# Patient Record
Sex: Female | Born: 1970 | Race: White | Hispanic: No | Smoking: Never smoker
Health system: Southern US, Community
[De-identification: ages and names within clinical notes are randomized; demographics above are authoritative.]

## PROBLEM LIST (undated history)

## (undated) ENCOUNTER — Emergency Department (HOSPITAL_BASED_OUTPATIENT_CLINIC_OR_DEPARTMENT_OTHER): Admission: EM | Payer: BLUE CROSS/BLUE SHIELD | Source: Home / Self Care

## (undated) DIAGNOSIS — R569 Unspecified convulsions: Secondary | ICD-10-CM

## (undated) DIAGNOSIS — F1010 Alcohol abuse, uncomplicated: Secondary | ICD-10-CM

## (undated) DIAGNOSIS — I10 Essential (primary) hypertension: Secondary | ICD-10-CM

## (undated) DIAGNOSIS — E7800 Pure hypercholesterolemia, unspecified: Secondary | ICD-10-CM

---

## 2008-03-15 ENCOUNTER — Emergency Department (HOSPITAL_BASED_OUTPATIENT_CLINIC_OR_DEPARTMENT_OTHER): Admission: EM | Admit: 2008-03-15 | Discharge: 2008-03-15 | Payer: Self-pay | Admitting: Emergency Medicine

## 2012-08-29 ENCOUNTER — Emergency Department (HOSPITAL_BASED_OUTPATIENT_CLINIC_OR_DEPARTMENT_OTHER)
Admission: EM | Admit: 2012-08-29 | Discharge: 2012-08-29 | Disposition: A | Discharge status: Home / Self Care | Payer: BC Managed Care – PPO | Attending: Emergency Medicine | Admitting: Emergency Medicine

## 2012-08-29 ENCOUNTER — Emergency Department (HOSPITAL_BASED_OUTPATIENT_CLINIC_OR_DEPARTMENT_OTHER): Payer: BC Managed Care – PPO

## 2012-08-29 ENCOUNTER — Encounter (HOSPITAL_BASED_OUTPATIENT_CLINIC_OR_DEPARTMENT_OTHER): Payer: Self-pay | Admitting: Student

## 2012-08-29 DIAGNOSIS — IMO0001 Reserved for inherently not codable concepts without codable children: Secondary | ICD-10-CM | POA: Insufficient documentation

## 2012-08-29 DIAGNOSIS — R05 Cough: Secondary | ICD-10-CM | POA: Insufficient documentation

## 2012-08-29 DIAGNOSIS — L519 Erythema multiforme, unspecified: Secondary | ICD-10-CM | POA: Insufficient documentation

## 2012-08-29 DIAGNOSIS — J189 Pneumonia, unspecified organism: Secondary | ICD-10-CM | POA: Insufficient documentation

## 2012-08-29 DIAGNOSIS — R51 Headache: Secondary | ICD-10-CM | POA: Insufficient documentation

## 2012-08-29 DIAGNOSIS — R509 Fever, unspecified: Secondary | ICD-10-CM | POA: Insufficient documentation

## 2012-08-29 DIAGNOSIS — R059 Cough, unspecified: Secondary | ICD-10-CM | POA: Insufficient documentation

## 2012-08-29 LAB — RAPID STREP SCREEN (MED CTR MEBANE ONLY): Streptococcus, Group A Screen (Direct): NEGATIVE

## 2012-08-29 MED ORDER — AZITHROMYCIN 250 MG PO TABS
500.0000 mg | ORAL_TABLET | Freq: Once | ORAL | Status: AC
  Administered 2012-08-29: 500 mg via ORAL
  Filled 2012-08-29: qty 2

## 2012-08-29 MED ORDER — AZITHROMYCIN 250 MG PO TABS: 250.0000 mg | ORAL_TABLET | Freq: Every day | ORAL | Status: DC

## 2012-08-29 NOTE — ED Notes (Signed)
Patient transported to X-ray 

## 2012-08-29 NOTE — ED Provider Notes (Signed)
History     CSN: [id number]  Arrival date & time 08/29/12  1645   First MD Initiated Contact with Patient 08/29/12 1746      Chief Complaint  Patient presents with  . Sore Throat  . Cough  . Generalized Body Aches  . Fever    (Consider location/radiation/quality/duration/timing/severity/associated sxs/prior treatment) Patient is a 41 y.o. female presenting with pharyngitis, cough, and fever. The history is provided by the patient. No language interpreter was used.  Sore Throat This is a new problem. The current episode started yesterday. The problem occurs constantly. The problem has been unchanged. Associated symptoms include coughing, a fever, headaches, myalgias and a sore throat. Nothing aggravates the symptoms. She has tried NSAIDs for the symptoms. The treatment provided mild relief.  Cough Associated symptoms include headaches, sore throat and myalgias.  Fever Primary symptoms of the febrile illness include fever, headaches, cough and myalgias.    History reviewed. No pertinent past medical history.  History reviewed. No pertinent past surgical history.  No family history on file.  History  Substance Use Topics  . Smoking status: Never Smoker   . Smokeless tobacco: Not on file  . Alcohol Use: Yes    OB History    Grav Para Term Preterm Abortions TAB SAB Ect Mult Living                  Review of Systems  Constitutional: Positive for fever.  HENT: Positive for sore throat.   Respiratory: Positive for cough.   Cardiovascular: Negative.   Musculoskeletal: Positive for myalgias.  Neurological: Positive for headaches.    Allergies  Demerol  Home Medications  No current outpatient prescriptions on file.  BP 138/78  Pulse 105  Temp 99 F (37.2 C) (Oral)  Resp 20  Ht 5\' 2"  (1.575 m)  Wt 170 lb (77.111 kg)  BMI 31.09 kg/m2  SpO2 97%  LMP 08/15/2012  Physical Exam  Nursing note and vitals reviewed. Constitutional: She is oriented to person,  place, and time. She appears well-developed and well-nourished.  HENT:  Head: Normocephalic and atraumatic.  Right Ear: External ear normal.  Left Ear: External ear normal.  Mouth/Throat: Posterior oropharyngeal erythema present.  Eyes: Pupils are equal, round, and reactive to light.  Neck: Normal range of motion. Neck supple.  Cardiovascular: Normal rate and regular rhythm.   Pulmonary/Chest: Effort normal and breath sounds normal.  Musculoskeletal: Normal range of motion.  Neurological: She is alert and oriented to person, place, and time.  Skin: Skin is warm and dry.  Psychiatric: She has a normal mood and affect.    ED Course  Procedures (including critical care time)   Labs Reviewed  RAPID STREP SCREEN   Dg Chest 2 View  08/29/2012  *RADIOLOGY REPORT*  Clinical Data: Cough, fever  CHEST - 2 VIEW  Comparison: None.  Findings: Mild elevation of the left diaphragmatic leaflet, with some patchy subsegmental atelectasis versus early interstitial infiltrate in the posterior left lower lobe.  Right lung clear.  No effusion.  Heart size normal.  Regional bones unremarkable.  IMPRESSION:  1.  Patchy posterior left lower lobe atelectasis or early infiltrate.   Original Report Authenticated By: D. Andria Rhein, MD      1. Community acquired pneumonia       MDM  Will treat for pneumonia:pt vitals are stable:pt given first dose of antibiotics here        Teressa Lower, NP 08/29/12 [phone]

## 2012-08-29 NOTE — ED Notes (Signed)
Vrinda Pickering, FNP at bedside 

## 2012-08-29 NOTE — ED Notes (Signed)
Pt reports onset of temp (104.8), cough, generalized body aches, wheezing, chills and sore throat that started last night. Fever relieved after taking ibuprofen.

## 2012-08-29 NOTE — ED Provider Notes (Signed)
Medical screening examination/treatment/procedure(s) were performed by non-physician practitioner and as supervising physician I was immediately available for consultation/collaboration.  Marwan T Powers, MD 08/29/12 2217 

## 2012-08-29 NOTE — ED Notes (Signed)
Fever 104.8 600 mg IBU at home 1 hr ago.

## 2012-08-30 NOTE — ED Provider Notes (Signed)
Medical screening examination/treatment/procedure(s) were performed by non-physician practitioner and as supervising physician I was immediately available for consultation/collaboration.  Tobin Chad, MD 08/30/12 [phone]

## 2017-03-22 ENCOUNTER — Emergency Department (HOSPITAL_BASED_OUTPATIENT_CLINIC_OR_DEPARTMENT_OTHER): Payer: BLUE CROSS/BLUE SHIELD

## 2017-03-22 ENCOUNTER — Encounter (HOSPITAL_BASED_OUTPATIENT_CLINIC_OR_DEPARTMENT_OTHER): Payer: Self-pay

## 2017-03-22 ENCOUNTER — Emergency Department (HOSPITAL_BASED_OUTPATIENT_CLINIC_OR_DEPARTMENT_OTHER)
Admission: EM | Admit: 2017-03-22 | Discharge: 2017-03-22 | Disposition: A | Discharge status: Home / Self Care | Payer: BLUE CROSS/BLUE SHIELD | Attending: Emergency Medicine | Admitting: Emergency Medicine

## 2017-03-22 DIAGNOSIS — S92511A Displaced fracture of proximal phalanx of right lesser toe(s), initial encounter for closed fracture: Secondary | ICD-10-CM

## 2017-03-22 DIAGNOSIS — S92512A Displaced fracture of proximal phalanx of left lesser toe(s), initial encounter for closed fracture: Secondary | ICD-10-CM | POA: Diagnosis not present

## 2017-03-22 DIAGNOSIS — W228XXA Striking against or struck by other objects, initial encounter: Secondary | ICD-10-CM | POA: Diagnosis not present

## 2017-03-22 DIAGNOSIS — Y999 Unspecified external cause status: Secondary | ICD-10-CM | POA: Diagnosis not present

## 2017-03-22 DIAGNOSIS — Z79899 Other long term (current) drug therapy: Secondary | ICD-10-CM | POA: Diagnosis not present

## 2017-03-22 DIAGNOSIS — Y939 Activity, unspecified: Secondary | ICD-10-CM | POA: Diagnosis not present

## 2017-03-22 DIAGNOSIS — Y929 Unspecified place or not applicable: Secondary | ICD-10-CM | POA: Diagnosis not present

## 2017-03-22 DIAGNOSIS — S99922A Unspecified injury of left foot, initial encounter: Secondary | ICD-10-CM | POA: Diagnosis present

## 2017-03-22 DIAGNOSIS — I10 Essential (primary) hypertension: Secondary | ICD-10-CM | POA: Insufficient documentation

## 2017-03-22 HISTORY — DX: Pure hypercholesterolemia, unspecified: E78.00

## 2017-03-22 HISTORY — DX: Essential (primary) hypertension: I10

## 2017-03-22 MED ORDER — TRAMADOL HCL 50 MG PO TABS: 50.0000 mg | ORAL_TABLET | Freq: Four times a day (QID) | ORAL | 0 refills | Status: DC | PRN

## 2017-03-22 MED ORDER — ACETAMINOPHEN 500 MG PO TABS
1000.0000 mg | ORAL_TABLET | Freq: Once | ORAL | Status: AC
  Administered 2017-03-22: 1000 mg via ORAL
  Filled 2017-03-22: qty 2

## 2017-03-22 NOTE — ED Provider Notes (Signed)
[facility] Note   CSN: [id number] Arrival date & time: 03/22/17  1713   By signing my name below, I, Clarisse GougeXavier Herndon, attest that this documentation has been prepared under the direction and in the presence of SPX CorporationMichael Maczis, PA-C. Electronically Signed: Clarisse GougeXavier Herndon, Scribe. 03/22/17. 5:40 PM.   History   Chief Complaint Chief Complaint  Patient presents with  . Foot Injury   The history is provided by the patient and medical records. No language interpreter was used.    Meredith Park is a 46 y.o. female with h/o HTN presenting to the Emergency Department concerning L 5th toe pain s/p dropping vase on foot around noon today. She states the vase was large, glass and weighed between 5-15 pounds; she states it did not break when it fell. She now has 6/10, constant pain that is worse with palpation, walking and application of pressure. There is small superficial laceration over the area in addition. No PTA medications. She states she is able to bear weight on the foot and ambulate but states this is painful. Pt noted elevated blood pressure. She states she drank 3-4 alcoholic beverages today. Last tetanus unknown. No other complaints at this time.   Past Medical History:  Diagnosis Date  . High cholesterol   . Hypertension     There are no active problems to display for this patient.   History reviewed. No pertinent surgical history.  OB History    No data available       Home Medications    Prior to Admission medications   Medication Sig Start Date End Date Taking? Authorizing Provider  ATORVASTATIN CALCIUM PO Take by mouth.   Yes [provider]  CARVEDILOL PO Take by mouth.   Yes [provider]  LOSARTAN POTASSIUM PO Take by mouth.   Yes [provider]  traMADol (ULTRAM) 50 MG tablet Take 1 tablet (50 mg total) by mouth every 6 (six) hours as needed. 03/22/17   Maczis, Elmer SowMichael M, PA-C    Family History No family  history on file.  Social History Social History  Substance Use Topics  . Smoking status: Never Smoker  . Smokeless tobacco: Never Used  . Alcohol use Yes     Comment: weekly     Allergies   Demerol [meperidine]   Review of Systems Review of Systems  Musculoskeletal: Positive for arthralgias, gait problem and myalgias. Negative for joint swelling.  Skin: Positive for wound.  All other systems reviewed and are negative.    Physical Exam Updated Vital Signs BP (!) 184/120 (BP Location: Left Arm)   Pulse (!) 103   Temp 98.1 F (36.7 C) (Oral)   Resp 20   Wt 172 lb 3.2 oz (78.1 kg)   LMP 02/19/2017   SpO2 100%   BMI 31.50 kg/m   Physical Exam  Constitutional: She appears well-developed and well-nourished.  HENT:  Head: Normocephalic and atraumatic.  Right Ear: External ear normal.  Left Ear: External ear normal.  Eyes: Conjunctivae are normal. Right eye exhibits no discharge. Left eye exhibits no discharge. No scleral icterus.  Cardiovascular:  Pulses:      Dorsalis pedis pulses are 2+ on the right side.       Posterior tibial pulses are 2+ on the right side.  Pulmonary/Chest: Effort normal. No respiratory distress.  Musculoskeletal:       Right ankle: Normal.       Right foot: There is tenderness, bony tenderness, swelling and  laceration. There is normal range of motion.       Feet:  NVI distally.   Neurological: She is alert. She has normal strength. No sensory deficit.  Skin: No pallor.  Psychiatric: She has a normal mood and affect.  Nursing note and vitals reviewed.    ED Treatments / Results  DIAGNOSTIC STUDIES: Oxygen Saturation is 100% on RA, NL by my interpretation.    COORDINATION OF CARE: 5:34 PM-Discussed next steps with pt. Pt verbalized understanding and is agreeable with the plan. Will order imaging.   Labs (all labs ordered are listed, but only abnormal results are displayed) Labs Reviewed - No data to display  EKG  EKG  Interpretation None       Radiology Dg Foot Complete Right  Result Date: 03/22/2017 CLINICAL DATA:  Rule [age]-year-old who dropped a base onto the right fifth toe earlier today with bleeding and bruising. Initial encounter. EXAM: RIGHT FOOT COMPLETE - 3+ VIEW COMPARISON:  None. FINDINGS: Comminuted mildly displaced likely intra-articular fracture involving the head of the proximal phalanx of the fifth toe. No fractures elsewhere. Well preserved joint spaces. Well-preserved bone mineral density. IMPRESSION: Comminuted mildly displaced fracture involving the head of the proximal phalanx of the fifth toe. Electronically Signed   By: Hulan Saas M.D.   On: 03/22/2017 18:23    Procedures Procedures (including critical care time)  Medications Ordered in ED Medications  acetaminophen (TYLENOL) tablet 1,000 mg (1,000 mg Oral Given 03/22/17 1749)     Initial Impression / Assessment and Plan / ED Course  I have reviewed the triage vital signs and the nursing notes.  Pertinent labs & imaging results that were available during my care of the patient were reviewed by me and considered in my medical decision making (see chart for details).     46 year old female presenting after dropping vase on right foot earlier today. X-rays show a comminuted, fracture involving the head of the proximal phalanx of the fifth toe. The patient does have a wound that was cleansed and explored to be superficial over the skin, not requiring closure. This is a closed fracture. Buddy tape applied to the toes, and patient placed in postop shoe. Pain management in the emergency department. The patient is neurovascularly intact distally. I advised the patient to follow-up with their orthopedics this week. I advised the patient to return to the emergency department with new or worsening symptoms or new concerns. Specific return precautions discussed. The patient verbalized understanding and agreement with plan. All  questions answered. No further questions at this time. Patient appears safe for discharge.  Patient was noted to have an elevated blood pressure during their stay in the emergency department. The patient has a history of high blood pressure. She state they have taken their medication for this today. The patient is taking carvedilol, atorvastatin, losartan as advised at home for HTN. Suspect the patients blood pressure is moderately elevated due to pain. The patient blood pressure downtrended after the patient was given tylenol for pain. Patient also has drank several drinks tonight. I advised the patient to discuss this with their PCP during her follow up visit to decide if medication management is needed for this.   Patient case discussed with Dr. Preston Fleeting who is in agreement with plan.    Final Clinical Impressions(s) / ED Diagnoses   Final diagnoses:  Closed displaced fracture of proximal phalanx of lesser toe of right foot, initial encounter    New Prescriptions Discharge Medication List  as of 03/22/2017  7:28 PM    START taking these medications   Details  traMADol (ULTRAM) 50 MG tablet Take 1 tablet (50 mg total) by mouth every 6 (six) hours as needed., Starting Thu 03/22/2017, Print       I personally performed the services described in this documentation, which was scribed in my presence. The recorded information has been reviewed and is accurate.       Princella Pellegrini 03/23/17 0038    Dione Booze, MD 03/23/17 [phone]

## 2017-03-22 NOTE — ED Notes (Signed)
Patient transported to X-ray 

## 2017-03-22 NOTE — ED Triage Notes (Signed)
Pt states she dropped a vase on right foot at pinky toe today-NAD-limping gait

## 2017-03-22 NOTE — ED Notes (Signed)
PMS intact before and after. Pt tolerated well. All questions answered. 

## 2017-03-22 NOTE — Discharge Instructions (Signed)
You have fractured you toe. Please wear buddy tape and post op shoe and follow-up with orthopedics for further evaluation and treatment.I have provided there number above.   For pain control you may take:  800mg  of ibuprofen (that is usually 4 over the counter pills)  3 times a day (take with food) and acetaminophen 975mg  (this is 3 over the counter pills) four times a day. Do not drink alcohol or combine with other medications that have acetaminophen as an ingredient (Read the labels!).  For breakthrough pain you may take tramadol. Do not drink alcohol drive or operate heavy machinery when taking tramadol.  If your toe was taped to a toe that is next to it (buddy taping). Change daily. Change it more often: If the gauze and tape get wet. If this happens, dry the space between the toes. If the gauze and tape are too tight and they cause your toe to become pale or to lose feeling (numb). Wear a protective shoe as told by your doctor.  Do not use any tobacco products, including cigarettes, chewing tobacco, or e-cigarettes. Tobacco can delay bone healing. If you need help quitting, ask your doctor. Contact a doctor if: You have a fever. Your pain medicine is not helping. Your toe feels cold. You lose feeling (have numbness) in your toe. You still have pain after one week of rest and treatment. You still have pain after your doctor has said that you can start walking again. You have pain or tingling in your foot, and it is not going away. You have loss of feeling in your foot, and it is not going away.  Your blood pressure was elevated during today's visit. Please take at home medication when arrive home. Please discuss this with your PCP during your follow-up appointment to determine if a medication adjustment/addition is needed for this

## 2017-08-24 ENCOUNTER — Encounter (HOSPITAL_BASED_OUTPATIENT_CLINIC_OR_DEPARTMENT_OTHER): Payer: Self-pay | Admitting: Emergency Medicine

## 2017-08-24 ENCOUNTER — Emergency Department (HOSPITAL_BASED_OUTPATIENT_CLINIC_OR_DEPARTMENT_OTHER): Payer: BLUE CROSS/BLUE SHIELD

## 2017-08-24 ENCOUNTER — Other Ambulatory Visit: Payer: Self-pay

## 2017-08-24 ENCOUNTER — Emergency Department (HOSPITAL_BASED_OUTPATIENT_CLINIC_OR_DEPARTMENT_OTHER)
Admission: EM | Admit: 2017-08-24 | Discharge: 2017-08-24 | Disposition: A | Discharge status: Home / Self Care | Payer: BLUE CROSS/BLUE SHIELD | Attending: Emergency Medicine | Admitting: Emergency Medicine

## 2017-08-24 DIAGNOSIS — F10931 Alcohol use, unspecified with withdrawal delirium: Secondary | ICD-10-CM

## 2017-08-24 DIAGNOSIS — Y909 Presence of alcohol in blood, level not specified: Secondary | ICD-10-CM | POA: Diagnosis not present

## 2017-08-24 DIAGNOSIS — I160 Hypertensive urgency: Secondary | ICD-10-CM

## 2017-08-24 DIAGNOSIS — F10231 Alcohol dependence with withdrawal delirium: Secondary | ICD-10-CM | POA: Insufficient documentation

## 2017-08-24 DIAGNOSIS — R4182 Altered mental status, unspecified: Secondary | ICD-10-CM | POA: Diagnosis present

## 2017-08-24 LAB — URINALYSIS, MICROSCOPIC (REFLEX): WBC, UA: NONE SEEN WBC/hpf (ref 0–5)

## 2017-08-24 LAB — URINALYSIS, ROUTINE W REFLEX MICROSCOPIC
GLUCOSE, UA: NEGATIVE mg/dL
Ketones, ur: >80 mg/dL — AB
LEUKOCYTES UA: NEGATIVE
NITRITE: NEGATIVE
PROTEIN: 100 mg/dL — AB
Specific Gravity, Urine: >1.03 — ABNORMAL HIGH (ref 1.005–1.030)
pH: 6 (ref 5.0–8.0)

## 2017-08-24 LAB — PREGNANCY, URINE: Preg Test, Ur: NEGATIVE

## 2017-08-24 LAB — CBC
HCT: 36.6 % (ref 36.0–46.0)
Hemoglobin: 12.5 g/dL (ref 12.0–15.0)
MCH: 28.9 pg (ref 26.0–34.0)
MCHC: 34.2 g/dL (ref 30.0–36.0)
MCV: 84.5 fL (ref 78.0–100.0)
PLATELETS: 202 10*3/uL (ref 150–400)
RBC: 4.33 MIL/uL (ref 3.87–5.11)
RDW: 14.9 % (ref 11.5–15.5)
WBC: 8.9 10*3/uL (ref 4.0–10.5)

## 2017-08-24 LAB — COMPREHENSIVE METABOLIC PANEL
ALT: 25 U/L (ref 14–54)
AST: 39 U/L (ref 15–41)
Albumin: 4.2 g/dL (ref 3.5–5.0)
Alkaline Phosphatase: 86 U/L (ref 38–126)
Anion gap: 15 (ref 5–15)
BUN: 8 mg/dL (ref 6–20)
CO2: 20 mmol/L — AB (ref 22–32)
CREATININE: 0.7 mg/dL (ref 0.44–1.00)
Calcium: 8.1 mg/dL — ABNORMAL LOW (ref 8.9–10.3)
Chloride: 96 mmol/L — ABNORMAL LOW (ref 101–111)
GFR calc non Af Amer: >60 mL/min (ref >60)
Glucose, Bld: 138 mg/dL — ABNORMAL HIGH (ref 65–99)
Potassium: 3.6 mmol/L (ref 3.5–5.1)
SODIUM: 131 mmol/L — AB (ref 135–145)
Total Bilirubin: 1.6 mg/dL — ABNORMAL HIGH (ref 0.3–1.2)
Total Protein: 7.4 g/dL (ref 6.5–8.1)

## 2017-08-24 LAB — AMMONIA: AMMONIA: 26 umol/L (ref 9–35)

## 2017-08-24 LAB — ETHANOL: Alcohol, Ethyl (B): <10 mg/dL (ref <10)

## 2017-08-24 LAB — CBG MONITORING, ED: Glucose-Capillary: 136 mg/dL — ABNORMAL HIGH (ref 65–99)

## 2017-08-24 MED ORDER — LOSARTAN POTASSIUM-HCTZ 100-25 MG PO TABS: 1.0000 | ORAL_TABLET | Freq: Every day | ORAL | 0 refills | Status: DC

## 2017-08-24 MED ORDER — CARVEDILOL 12.5 MG PO TABS: 12.5000 mg | ORAL_TABLET | Freq: Two times a day (BID) | ORAL | 0 refills | Status: DC

## 2017-08-24 MED ORDER — SODIUM CHLORIDE 0.9 % IV BOLUS (SEPSIS)
1000.0000 mL | Freq: Once | INTRAVENOUS | Status: AC
  Administered 2017-08-24: 1000 mL via INTRAVENOUS

## 2017-08-24 MED ORDER — VITAMIN B-1 100 MG PO TABS
100.0000 mg | ORAL_TABLET | Freq: Once | ORAL | Status: AC
  Administered 2017-08-24: 100 mg via ORAL
  Filled 2017-08-24: qty 1

## 2017-08-24 MED ORDER — THIAMINE HCL 100 MG/ML IJ SOLN
Freq: Once | INTRAVENOUS | Status: DC
  Filled 2017-08-24: qty 1000

## 2017-08-24 MED ORDER — CHLORDIAZEPOXIDE HCL 25 MG PO CAPS: ORAL_CAPSULE | ORAL | 0 refills | Status: DC

## 2017-08-24 MED ORDER — FOLIC ACID 1 MG PO TABS
1.0000 mg | ORAL_TABLET | Freq: Once | ORAL | Status: AC
  Administered 2017-08-24: 1 mg via ORAL
  Filled 2017-08-24: qty 1

## 2017-08-24 MED ORDER — ONDANSETRON HCL 4 MG/2ML IJ SOLN
4.0000 mg | Freq: Once | INTRAMUSCULAR | Status: AC
  Administered 2017-08-24: 4 mg via INTRAVENOUS
  Filled 2017-08-24: qty 2

## 2017-08-24 MED ORDER — ONDANSETRON HCL 4 MG PO TABS: 4.0000 mg | ORAL_TABLET | Freq: Three times a day (TID) | ORAL | 0 refills | Status: DC | PRN

## 2017-08-24 MED ORDER — HYDRALAZINE HCL 20 MG/ML IJ SOLN
10.0000 mg | Freq: Once | INTRAMUSCULAR | Status: AC
  Administered 2017-08-24: 10 mg via INTRAVENOUS
  Filled 2017-08-24: qty 1

## 2017-08-24 NOTE — ED Notes (Signed)
During triage the daughter states that she has been with her mom all day - Patient laid down for a nap about 2 hours ago and then slept for 1 hour. THe patient woke up with new confusion. Daughter and Patient both states that the patient was not confused prior to laying down 2 hours ago

## 2017-08-24 NOTE — ED Notes (Signed)
Pt and family understood dc material. NAD noted. SCripts given at dc 

## 2017-08-24 NOTE — ED Triage Notes (Signed)
Patient states that she has been throwing up for the last 24 hours - Denies any pain - patient states that she has drank ETOH  Recently unsure of when the last was - "way too much" - last drink was  and has High Blood Pressure noted in triage

## 2017-08-24 NOTE — ED Provider Notes (Signed)
[facility] EMERGENCY DEPARTMENT Provider Note   CSN: [id number] Arrival date & time: 08/24/17  1729     History   Chief Complaint Chief Complaint  Patient presents with  . Altered Mental Status    HPI Meredith Park is a 46 y.o. female.  HPI   46 year old female with history of hypertension, hypercholesteremia, alcohol abuse brought in by daughter for evaluation of altered mental status.  History obtained through daughter who is at bedside.  Patient has been drinking heavy amount of alcohol for the past 8 years, usually half a fifth of liquor daily.  She admits that she is an alcoholic.  Drank heavily last night and was feeling sick throughout the day today.  States that she is nauseous and vomited multiple times.  She went to sleep and when she woke up she felt confused.  Daughter noticed that her face was puffy and check blood pressure.  States that it was high in the 200s and patient brought here for further evaluation.  Currently patient endorsed nausea and a mild headache.  She denies vision changes, slurring of speech, feeling tremulous, having chest pain, trouble breathing, abdominal pain or back pain.  Denies any focal numbness or weakness.  Past Medical History:  Diagnosis Date  . High cholesterol   . Hypertension     There are no active problems to display for this patient.   History reviewed. No pertinent surgical history.  OB History    No data available       Home Medications    Prior to Admission medications   Medication Sig Start Date End Date Taking? Authorizing Provider  ATORVASTATIN CALCIUM PO Take by mouth.    [provider]  CARVEDILOL PO Take 12.5 mg by mouth 2 (two) times daily.     [provider]  chlordiazePOXIDE (LIBRIUM) 25 MG capsule 50mg  PO TID x 1D, then 25-50mg  PO BID X 1D, then 25-50mg  PO QD X 1D 08/24/17   Fayrene Helper, PA-C  LOSARTAN POTASSIUM PO Take by mouth.    [provider]    losartan-hydrochlorothiazide (HYZAAR) 100-25 MG tablet Take 1 tablet by mouth daily.    [provider]  ondansetron (ZOFRAN) 4 MG tablet Take 1 tablet (4 mg total) by mouth every 8 (eight) hours as needed for nausea or vomiting. 08/24/17   Fayrene Helper, PA-C  traMADol (ULTRAM) 50 MG tablet Take 1 tablet (50 mg total) by mouth every 6 (six) hours as needed. 03/22/17   Maczis, Elmer Sow, PA-C    Family History History reviewed. No pertinent family history.  Social History Social History   Tobacco Use  . Smoking status: Never Smoker  . Smokeless tobacco: Never Used  Substance Use Topics  . Alcohol use: Yes    Comment: weekly  . Drug use: No     Allergies   Demerol [meperidine]   Review of Systems Review of Systems  All other systems reviewed and are negative.    Physical Exam Updated Vital Signs BP (!) 202/138 (BP Location: Left Arm)   Pulse (!) 116   Temp 98.6 F (37 C) (Oral)   Resp 16   Ht 5\' 2"  (1.575 m)   Wt 90.7 kg (200 lb)   SpO2 93%   BMI 36.58 kg/m   Physical Exam  Constitutional: She is oriented to person, place, and time. She appears well-developed and well-nourished. No distress.  HENT:  Head: Atraumatic.  Mouth/Throat: Oropharynx is clear and moist.  Eyes:  Conjunctivae and EOM are normal. Pupils are equal, round, and reactive to light.  Neck: Normal range of motion. Neck supple.  No nuchal rigidity  Cardiovascular:  Tachycardia without murmur rubs or gallops  Pulmonary/Chest: Effort normal and breath sounds normal.  Abdominal: Soft. Bowel sounds are normal. She exhibits no distension. There is no tenderness.  Neurological: She is alert and oriented to person, place, and time. She has normal strength. She displays no tremor. No cranial nerve deficit or sensory deficit. She displays a negative Romberg sign. Coordination normal. GCS eye subscore is 4. GCS verbal subscore is 5. GCS motor subscore is 6.  Skin: No rash noted.  Psychiatric: She  has a normal mood and affect.  Nursing note and vitals reviewed.    ED Treatments / Results  Labs (all labs ordered are listed, but only abnormal results are displayed) Labs Reviewed  COMPREHENSIVE METABOLIC PANEL - Abnormal; Notable for the following components:      Result Value   Sodium 131 (*)    Chloride 96 (*)    CO2 20 (*)    Glucose, Bld 138 (*)    Calcium 8.1 (*)    Total Bilirubin 1.6 (*)    All other components within normal limits  URINALYSIS, ROUTINE W REFLEX MICROSCOPIC - Abnormal; Notable for the following components:   Specific Gravity, Urine >1.030 (*)    Hgb urine dipstick SMALL (*)    Bilirubin Urine SMALL (*)    Ketones, ur >80 (*)    Protein, ur 100 (*)    All other components within normal limits  URINALYSIS, MICROSCOPIC (REFLEX) - Abnormal; Notable for the following components:   Bacteria, UA RARE (*)    Squamous Epithelial / LPF 0-5 (*)    All other components within normal limits  CBG MONITORING, ED - Abnormal; Notable for the following components:   Glucose-Capillary 136 (*)    All other components within normal limits  CBC  PREGNANCY, URINE  ETHANOL  AMMONIA    EKG  EKG Interpretation  Date/Time:  Friday August 24 2017 17:57:45 EST Ventricular Rate:  104 PR Interval:    QRS Duration: 96 QT Interval:  368 QTC Calculation: 484 R Axis:   52 Text Interpretation:  Sinus tachycardia Baseline wander in lead(s) II III aVF No previous ECGs available Confirmed by Alvira MondaySchlossman, Erin ([id number]) on 08/24/2017 8:27:39 PM       Radiology Ct Head Wo Contrast  Result Date: 08/24/2017 CLINICAL DATA:  History of high blood pressure. Confusion. Vomiting. EXAM: CT HEAD WITHOUT CONTRAST TECHNIQUE: Contiguous axial images were obtained from the base of the skull through the vertex without intravenous contrast. COMPARISON:  None. FINDINGS: Brain: No evidence for acute infarction, hemorrhage, mass lesion, hydrocephalus, or extra-axial fluid. Normal cerebral  volume. No white matter disease. Vascular: Calcification of the cavernous internal carotid arteries consistent with cerebrovascular atherosclerotic disease. No signs of intracranial large vessel occlusion. Skull: Normal. Negative for fracture or focal lesion. Sinuses/Orbits: No acute finding. Other: None. IMPRESSION: Negative exam. Electronically Signed   By: Elsie StainJohn T Curnes M.D.   On: 08/24/2017 18:21    Procedures Procedures (including critical care time)  Medications Ordered in ED Medications  sodium chloride 0.9 % bolus 1,000 mL (0 mLs Intravenous Stopped 08/24/17 1938)  ondansetron (ZOFRAN) injection 4 mg (4 mg Intravenous Given 08/24/17 1828)  hydrALAZINE (APRESOLINE) injection 10 mg (10 mg Intravenous Given 08/24/17 1828)  sodium chloride 0.9 % bolus 1,000 mL (0 mLs Intravenous Stopped 08/24/17 2125)  folic acid (  FOLVITE) tablet 1 mg (1 mg Oral Given 08/24/17 2025)  thiamine (VITAMIN B-1) tablet 100 mg (100 mg Oral Given 08/24/17 2025)     Initial Impression / Assessment and Plan / ED Course  I have reviewed the triage vital signs and the nursing notes.  Pertinent labs & imaging results that were available during my care of the patient were reviewed by me and considered in my medical decision making (see chart for details).     BP (!) 173/104   Pulse 100   Temp 98.6 F (37 C) (Oral)   Resp 19   Ht 5\' 2"  (1.575 m)   Wt 90.7 kg (200 lb)   SpO2 97%   BMI 36.58 kg/m    Final Clinical Impressions(s) / ED Diagnoses   Final diagnoses:  Alcohol withdrawal delirium (HCC)  Hypertensive urgency    ED Discharge Orders        Ordered    chlordiazePOXIDE (LIBRIUM) 25 MG capsule     08/24/17 2143    ondansetron (ZOFRAN) 4 MG tablet  Every 8 hours PRN     08/24/17 2143     6:09 PM Patient who drinks alcohol on a regular basis associated with nausea and vomiting, she is able to answer questions appropriately.  She has no focal neuro deficit on exam.  She was found to be  hypertensive with a blood pressure of 203/138.  She is tachycardic with a heart rate of 116.  Will provide some traumatic treatment, hydralazine for blood pressure, head CT scan to rule out intracranial bleed.  Doubt delirium tremens. NO seizures. Care discussed with Dr Dalene SeltzerSchlossman.    9:41 PM Patient received several boluses of IV fluid and felt much better.  Her blood pressure improved.  Her heart rate improves.  She is mentating appropriately.  She is agreeable with alcohol cessation and will reach out to AA tomorrow to get help.  She is making appropriate clinical decision.  Her workup has been unremarkable aside from signs of dehydration.  At this time I feel patient is safe to be discharged home.  Strict return precautions discussed.  Will refill her BP medications   Fayrene Helperran, Hildegard Hlavac, PA-C 08/24/17 2144    Fayrene Helperran, Elsa Ploch, PA-C 08/24/17 2203    Alvira MondaySchlossman, Erin, MD 08/25/17 2100

## 2017-08-24 NOTE — ED Triage Notes (Signed)
Patients daughter states that when she arrived home the patient was flush and was vomiting and the patient was showing signs of " high Blood Pressure" - the patients daughter told the patient to take her BP medications and the patient was altered - patient has noted confusion and attention issues. Last ETOH was about 24 hours ago. For the last week the patient admits to drinking way to much " I am an alcoholic"

## 2017-08-24 NOTE — ED Notes (Signed)
ED Provider at bedside. 

## 2017-08-26 DIAGNOSIS — F4323 Adjustment disorder with mixed anxiety and depressed mood: Secondary | ICD-10-CM | POA: Diagnosis present

## 2017-08-26 DIAGNOSIS — I10 Essential (primary) hypertension: Secondary | ICD-10-CM | POA: Diagnosis present

## 2017-08-26 DIAGNOSIS — E782 Mixed hyperlipidemia: Secondary | ICD-10-CM | POA: Insufficient documentation

## 2017-12-07 ENCOUNTER — Encounter (HOSPITAL_BASED_OUTPATIENT_CLINIC_OR_DEPARTMENT_OTHER): Payer: Self-pay | Admitting: *Deleted

## 2017-12-07 ENCOUNTER — Emergency Department (HOSPITAL_BASED_OUTPATIENT_CLINIC_OR_DEPARTMENT_OTHER)
Admission: EM | Admit: 2017-12-07 | Discharge: 2017-12-07 | Disposition: A | Discharge status: Home / Self Care | Payer: BLUE CROSS/BLUE SHIELD | Attending: Emergency Medicine | Admitting: Emergency Medicine

## 2017-12-07 ENCOUNTER — Other Ambulatory Visit: Payer: Self-pay

## 2017-12-07 ENCOUNTER — Emergency Department (HOSPITAL_BASED_OUTPATIENT_CLINIC_OR_DEPARTMENT_OTHER): Payer: BLUE CROSS/BLUE SHIELD

## 2017-12-07 DIAGNOSIS — F10239 Alcohol dependence with withdrawal, unspecified: Secondary | ICD-10-CM | POA: Insufficient documentation

## 2017-12-07 DIAGNOSIS — Z79899 Other long term (current) drug therapy: Secondary | ICD-10-CM | POA: Insufficient documentation

## 2017-12-07 DIAGNOSIS — I10 Essential (primary) hypertension: Secondary | ICD-10-CM | POA: Insufficient documentation

## 2017-12-07 DIAGNOSIS — R569 Unspecified convulsions: Secondary | ICD-10-CM

## 2017-12-07 HISTORY — DX: Alcohol abuse, uncomplicated: F10.10

## 2017-12-07 LAB — COMPREHENSIVE METABOLIC PANEL
ALBUMIN: 5 g/dL (ref 3.5–5.0)
ALT: 85 U/L — ABNORMAL HIGH (ref 14–54)
ANION GAP: 21 — AB (ref 5–15)
AST: 78 U/L — ABNORMAL HIGH (ref 15–41)
Alkaline Phosphatase: 87 U/L (ref 38–126)
BILIRUBIN TOTAL: 1.5 mg/dL — AB (ref 0.3–1.2)
BUN: 8 mg/dL (ref 6–20)
CHLORIDE: 102 mmol/L (ref 101–111)
CO2: 18 mmol/L — AB (ref 22–32)
Calcium: 9 mg/dL (ref 8.9–10.3)
Creatinine, Ser: 0.8 mg/dL (ref 0.44–1.00)
GFR calc Af Amer: >60 mL/min (ref >60)
GFR calc non Af Amer: >60 mL/min (ref >60)
GLUCOSE: 157 mg/dL — AB (ref 65–99)
POTASSIUM: 4.4 mmol/L (ref 3.5–5.1)
SODIUM: 141 mmol/L (ref 135–145)
TOTAL PROTEIN: 8.4 g/dL — AB (ref 6.5–8.1)

## 2017-12-07 LAB — CBC WITH DIFFERENTIAL/PLATELET
BASOS ABS: 0.1 10*3/uL (ref 0.0–0.1)
Basophils Relative: 1 %
Eosinophils Absolute: 0 10*3/uL (ref 0.0–0.7)
Eosinophils Relative: 0 %
HEMATOCRIT: 39.1 % (ref 36.0–46.0)
Hemoglobin: 12.7 g/dL (ref 12.0–15.0)
Lymphocytes Relative: 9 %
Lymphs Abs: 1.1 10*3/uL (ref 0.7–4.0)
MCH: 30 pg (ref 26.0–34.0)
MCHC: 32.5 g/dL (ref 30.0–36.0)
MCV: 92.4 fL (ref 78.0–100.0)
MONO ABS: 0.6 10*3/uL (ref 0.1–1.0)
Monocytes Relative: 6 %
NEUTROS ABS: 9.7 10*3/uL — AB (ref 1.7–7.7)
Neutrophils Relative %: 84 %
Platelets: 241 10*3/uL (ref 150–400)
RBC: 4.23 MIL/uL (ref 3.87–5.11)
RDW: 16.7 % — AB (ref 11.5–15.5)
WBC: 11.5 10*3/uL — ABNORMAL HIGH (ref 4.0–10.5)

## 2017-12-07 LAB — HCG, SERUM, QUALITATIVE: Preg, Serum: NEGATIVE

## 2017-12-07 LAB — ETHANOL: Alcohol, Ethyl (B): <10 mg/dL (ref <10)

## 2017-12-07 MED ORDER — LORAZEPAM 2 MG/ML IJ SOLN
0.0000 mg | Freq: Four times a day (QID) | INTRAMUSCULAR | Status: DC
  Administered 2017-12-07: 2 mg via INTRAVENOUS
  Filled 2017-12-07: qty 1

## 2017-12-07 MED ORDER — LORAZEPAM 1 MG PO TABS: 0.0000 mg | ORAL_TABLET | Freq: Four times a day (QID) | ORAL | Status: DC

## 2017-12-07 MED ORDER — VITAMIN B-1 100 MG PO TABS: 100.0000 mg | ORAL_TABLET | Freq: Every day | ORAL | Status: DC

## 2017-12-07 MED ORDER — CHLORDIAZEPOXIDE HCL 25 MG PO CAPS
50.0000 mg | ORAL_CAPSULE | Freq: Once | ORAL | Status: AC
  Administered 2017-12-07: 50 mg via ORAL
  Filled 2017-12-07: qty 2

## 2017-12-07 MED ORDER — DIAZEPAM 5 MG/ML IJ SOLN
5.0000 mg | Freq: Once | INTRAMUSCULAR | Status: AC
  Administered 2017-12-07: 5 mg via INTRAVENOUS
  Filled 2017-12-07: qty 2

## 2017-12-07 MED ORDER — IBUPROFEN 400 MG PO TABS
600.0000 mg | ORAL_TABLET | Freq: Once | ORAL | Status: AC
  Administered 2017-12-07: 23:00:00 600 mg via ORAL
  Filled 2017-12-07: qty 1

## 2017-12-07 MED ORDER — CHLORDIAZEPOXIDE HCL 25 MG PO CAPS: ORAL_CAPSULE | ORAL | 0 refills | Status: DC

## 2017-12-07 MED ORDER — ONDANSETRON HCL 4 MG/2ML IJ SOLN
4.0000 mg | Freq: Once | INTRAMUSCULAR | Status: AC
  Administered 2017-12-07: 4 mg via INTRAVENOUS
  Filled 2017-12-07: qty 2

## 2017-12-07 MED ORDER — SODIUM CHLORIDE 0.9 % IV BOLUS
1000.0000 mL | Freq: Once | INTRAVENOUS | Status: AC
  Administered 2017-12-07: 1000 mL via INTRAVENOUS

## 2017-12-07 MED ORDER — LORAZEPAM 1 MG PO TABS: 0.0000 mg | ORAL_TABLET | Freq: Two times a day (BID) | ORAL | Status: DC

## 2017-12-07 MED ORDER — THIAMINE HCL 100 MG/ML IJ SOLN
100.0000 mg | Freq: Every day | INTRAMUSCULAR | Status: DC
  Administered 2017-12-07: 100 mg via INTRAVENOUS
  Filled 2017-12-07: qty 2

## 2017-12-07 MED ORDER — LORAZEPAM 2 MG/ML IJ SOLN: 0.0000 mg | Freq: Two times a day (BID) | INTRAMUSCULAR | Status: DC

## 2017-12-07 NOTE — Discharge Instructions (Addendum)
Your evaluated in the emergency department for nausea and vomiting and were found to be in alcohol withdrawal.  You also had a seizure which was likely an alcohol withdrawal seizure.  We are prescribing you chlordiazepoxide to help with withdrawal symptoms.  It is important that you do not drink while taking this medication.  Please let your doctor know what is going on and consider detox.  [facility] is a facility that can set you up with some counseling.

## 2017-12-07 NOTE — ED Provider Notes (Signed)
[facility] EMERGENCY DEPARTMENT Provider Note   CSN: [id number] Arrival date & time: 12/07/17  1750     History   Chief Complaint No chief complaint on file.   HPI Meredith Park is a 47 y.o. female.  47 year old female brought in by her daughter for elevated blood pressure and drinking too much.  Daughter is getting much of the history as the patient defers to her but she will answer some questions.  It sounds like she has been drinking fairly steadily since Monday.  Today she is been feeling unwell and vomiting.  No blood.  She has had these episodes before when she does not take her blood pressure medicine and stops drinking.  She is never had a withdrawal seizure before.  The patient denies having any kind of tremor but the daughter has noticed tremor before when she stops drinking.  The patient currently is not interested in any kind of detox.  There is some diffuse abdominal pain that the patient rates as mild.  She attributes it to her vomiting.  The history is provided by the patient and a relative.  Emesis   This is a new problem. The current episode started 6 to 12 hours ago. The problem occurs 5 to 10 times per day. The problem has not changed since onset.The emesis has an appearance of stomach contents. There has been no fever. Associated symptoms include abdominal pain and sweats. Pertinent negatives include no arthralgias, no chills, no cough, no diarrhea, no fever and no URI.    Past Medical History:  Diagnosis Date  . Alcohol abuse   . High cholesterol   . Hypertension     There are no active problems to display for this patient.   History reviewed. No pertinent surgical history.   OB History   None      Home Medications    Prior to Admission medications   Medication Sig Start Date End Date Taking? Authorizing Provider  ATORVASTATIN CALCIUM PO Take by mouth.    [provider]  carvedilol (COREG) 12.5 MG tablet Take 1 tablet (12.5  mg total) by mouth 2 (two) times daily. 08/24/17   Fayrene Helperran, Bowie, PA-C  chlordiazePOXIDE (LIBRIUM) 25 MG capsule 50mg  PO TID x 1D, then 25-50mg  PO BID X 1D, then 25-50mg  PO QD X 1D 08/24/17   Fayrene Helperran, Bowie, PA-C  losartan-hydrochlorothiazide (HYZAAR) 100-25 MG tablet Take 1 tablet by mouth daily. 08/24/17   Fayrene Helperran, [place], PA-C  ondansetron (ZOFRAN) 4 MG tablet Take 1 tablet (4 mg total) by mouth every 8 (eight) hours as needed for nausea or vomiting. 08/24/17   Fayrene Helperran, [place]ie, PA-C  traMADol (ULTRAM) 50 MG tablet Take 1 tablet (50 mg total) by mouth every 6 (six) hours as needed. 03/22/17   Maczis, Elmer SowMichael M, PA-C    Family History No family history on file.  Social History Social History   Tobacco Use  . Smoking status: Never Smoker  . Smokeless tobacco: Never Used  Substance Use Topics  . Alcohol use: Yes    Comment: weekly  . Drug use: No     Allergies   Demerol [meperidine]   Review of Systems Review of Systems  Constitutional: Negative for chills and fever.  HENT: Negative for ear pain and sore throat.   Eyes: Negative for pain and visual disturbance.  Respiratory: Negative for cough and shortness of breath.   Cardiovascular: Negative for chest pain and palpitations.  Gastrointestinal: Positive for abdominal pain and vomiting. Negative  for diarrhea.  Genitourinary: Negative for dysuria and hematuria.  Musculoskeletal: Negative for arthralgias and back pain.  Skin: Negative for color change and rash.  Neurological: Negative for seizures and syncope.  All other systems reviewed and are negative.    Physical Exam Updated Vital Signs BP (!) 181/136   Pulse (!) 110   Temp 98.6 F (37 C) (Oral)   Resp 20   Ht 5' 2.5" (1.588 m)   Wt 90.7 kg (200 lb)   LMP 11/21/2017   SpO2 97%   BMI 36.00 kg/m   Physical Exam  Constitutional: She appears well-developed and well-nourished. No distress.  HENT:  Head: Normocephalic and atraumatic.  Eyes: Conjunctivae are normal.  Neck:  Neck supple.  Cardiovascular: Regular rhythm. Tachycardia present.  No murmur heard. Pulmonary/Chest: Effort normal and breath sounds normal. No respiratory distress.  Abdominal: Soft. There is no tenderness.  Musculoskeletal: Normal range of motion. She exhibits no edema, tenderness or deformity.  Neurological: She is alert. She has normal strength. No cranial nerve deficit or sensory deficit. GCS eye subscore is 4. GCS verbal subscore is 5. GCS motor subscore is 6.  Skin: Skin is warm and dry.  Psychiatric: She has a normal mood and affect.  Nursing note and vitals reviewed.    ED Treatments / Results  Labs (all labs ordered are listed, but only abnormal results are displayed) Labs Reviewed  COMPREHENSIVE METABOLIC PANEL - Abnormal; Notable for the following components:      Result Value   CO2 18 (*)    Glucose, Bld 157 (*)    Total Protein 8.4 (*)    AST 78 (*)    ALT 85 (*)    Total Bilirubin 1.5 (*)    Anion gap 21 (*)    All other components within normal limits  CBC WITH DIFFERENTIAL/PLATELET - Abnormal; Notable for the following components:   WBC 11.5 (*)    RDW 16.7 (*)    Neutro Abs 9.7 (*)    All other components within normal limits  ETHANOL  HCG, SERUM, QUALITATIVE    EKG None  Radiology Ct Head Wo Contrast  Result Date: 12/07/2017 CLINICAL DATA:  Seizure.  Dizziness. EXAM: CT HEAD WITHOUT CONTRAST TECHNIQUE: Contiguous axial images were obtained from the base of the skull through the vertex without intravenous contrast. COMPARISON:  08/24/2017 FINDINGS: Brain: There is no evidence for acute hemorrhage, hydrocephalus, mass lesion, or abnormal extra-axial fluid collection. No definite CT evidence for acute infarction. Vascular: No hyperdense vessel or unexpected calcification. Skull: No evidence for fracture. No worrisome lytic or sclerotic lesion. Sinuses/Orbits: The visualized paranasal sinuses and mastoid air cells are clear. Visualized portions of the globes  and intraorbital fat are unremarkable. Other: None. IMPRESSION: No acute intracranial abnormality. Electronically Signed   By: Kennith Center M.D.   On: 12/07/2017 21:11    Procedures Procedures (including critical care time)  Medications Ordered in ED Medications  sodium chloride 0.9 % bolus 1,000 mL (has no administration in time range)  ondansetron (ZOFRAN) injection 4 mg (has no administration in time range)  diazepam (VALIUM) injection 5 mg (has no administration in time range)     Initial Impression / Assessment and Plan / ED Course  I have reviewed the triage vital signs and the nursing notes.  Pertinent labs & imaging results that were available during my care of the patient were reviewed by me and considered in my medical decision making (see chart for details).  Clinical Course as  of Dec 08 1052  Fri Dec 07, 2017  1851 I did asked the patient if she would be interested in detox.  She states that she has not interested.  I did offer that they have outpatient counseling and the patient did volunteer that she is still grieving from significant other that kill themselves.  We will make sure if she is going to be discharged that we give her the information for day mark.   [MB]  1920 Called to the patient's room because she was actively seizing.  She was placed on a nonrebreather.  The patient's IV was just being established so we gave her the Valium and her Zofran.  The seizure quickly stopped and was probably about 2-3 minutes total time.  Currently she is sleeping.  I updated the family   [MB]  1921 .   [MB]  1936 Reevaluated-patient still sleeping comfortably.  Heart rate 103 sats 100% on nonrebreather.   [MB]  2112 Patient reevaluated.  She is more alert and denies any specific complaints.  She does not recall any of the episode after coming to the emergency department.  She does not recall meeting me.  I reviewed what it happened including her seizure and the likelihood that  this is due to alcohol withdrawal.  Her sister and daughter are here and are talking with her about getting help.  Patient was up with assistance to the bathroom.  I will recheck with him after the results of the CT to come up with a disposition plan.   [MB]  2234 Had a prolonged conversation with the patient her daughter and her sister.  There is no indication for admission now she is cleared her mental status and her vitals do not have her in acute withdrawal.  We are going to give her a dose of Librium and the Librium taper.  We are also giving her the number for a day mark to get set up with some counseling and consideration for detox.  Patient currently is not interested in detox but will take the information.  The patient will be discharged to the sister and who is taking her home and will keep an eye on her.  The patient understands not to be drinking if she is going to take Librium.  They will return if any worsening symptoms.   [MB]    Clinical Course User Index [MB] Terrilee Files, MD    Final Clinical Impressions(s) / ED Diagnoses   Final diagnoses:  Alcohol dependence with withdrawal with complication [facility])  Seizure [facility])    ED Discharge Orders        Ordered    chlordiazePOXIDE (LIBRIUM) 25 MG capsule     12/07/17 2238       Terrilee Files, MD 12/08/17 1056

## 2017-12-07 NOTE — ED Triage Notes (Signed)
States she drank to much alcohol. Vomiting all day. Her BP is high. States her family made her come. States she is not here to get help with alcohol abuse.

## 2017-12-07 NOTE — ED Notes (Signed)
Dc instructions given, pt, her mom and daughter verbalized understanding. Pt unable to sign for dc instructions due to epic signature pad getting frozen and not capturing signature. Pt wheeled out to the lobby, AO x 4 and stable.

## 2018-02-04 ENCOUNTER — Other Ambulatory Visit: Payer: Self-pay

## 2018-02-04 ENCOUNTER — Emergency Department (HOSPITAL_BASED_OUTPATIENT_CLINIC_OR_DEPARTMENT_OTHER)
Admission: EM | Admit: 2018-02-04 | Discharge: 2018-02-04 | Disposition: A | Discharge status: Home / Self Care | Payer: BLUE CROSS/BLUE SHIELD | Attending: Emergency Medicine | Admitting: Emergency Medicine

## 2018-02-04 ENCOUNTER — Encounter (HOSPITAL_BASED_OUTPATIENT_CLINIC_OR_DEPARTMENT_OTHER): Payer: Self-pay | Admitting: *Deleted

## 2018-02-04 DIAGNOSIS — I10 Essential (primary) hypertension: Secondary | ICD-10-CM | POA: Insufficient documentation

## 2018-02-04 DIAGNOSIS — Z79899 Other long term (current) drug therapy: Secondary | ICD-10-CM | POA: Diagnosis not present

## 2018-02-04 DIAGNOSIS — F10920 Alcohol use, unspecified with intoxication, uncomplicated: Secondary | ICD-10-CM | POA: Insufficient documentation

## 2018-02-04 DIAGNOSIS — H6501 Acute serous otitis media, right ear: Secondary | ICD-10-CM

## 2018-02-04 DIAGNOSIS — H9201 Otalgia, right ear: Secondary | ICD-10-CM | POA: Diagnosis present

## 2018-02-04 MED ORDER — AZITHROMYCIN 250 MG PO TABS: 250.0000 mg | ORAL_TABLET | Freq: Every day | ORAL | 0 refills | Status: DC

## 2018-02-04 MED ORDER — LORAZEPAM 1 MG PO TABS
0.5000 mg | ORAL_TABLET | Freq: Once | ORAL | Status: AC
  Administered 2018-02-04: 0.5 mg via ORAL
  Filled 2018-02-04: qty 1

## 2018-02-04 MED ORDER — CHLORDIAZEPOXIDE HCL 25 MG PO CAPS: ORAL_CAPSULE | ORAL | 0 refills | Status: DC

## 2018-02-04 NOTE — ED Provider Notes (Signed)
[facility] EMERGENCY DEPARTMENT Provider Note   CSN: [id number] Arrival date & time: 02/04/18  1925     History   Chief Complaint Chief Complaint  Patient presents with  . Otalgia    HPI Meredith Park is a 47 y.o. female.  Patient presents with several days of right ear pain.  She has had decreased hearing in her ear.  She states that she had fallen into a swimming pool prior to onset.  Pain was gradual onset, not acute.  No drainage or discharge from the ear.  She has had no fevers, nausea or vomiting.  She had leftover Augmentin at home which she has taken several doses of.  Prescription was about 71 months old.    Patient also has been drinking heavily recently.  Mother is at bedside and patient will be with her tonight.  Mother is concerned that she will have a alcohol withdrawal seizure, which she has a history of.  Patient states that she will no longer be drinking.  She states that she will be trying to go to [employer] [facility] after her son's graduation this week for help with her alcoholism.  Patient's mother is helping facilitate this.     Past Medical History:  Diagnosis Date  . Alcohol abuse   . High cholesterol   . Hypertension     There are no active problems to display for this patient.   History reviewed. No pertinent surgical history.   OB History   None      Home Medications    Prior to Admission medications   Medication Sig Start Date End Date Taking? Authorizing Provider  ATORVASTATIN CALCIUM PO Take by mouth.    [provider]  azithromycin (ZITHROMAX) 250 MG tablet Take 1 tablet (250 mg total) by mouth daily. Take first 2 tablets together, then 1 every day until finished. 02/04/18   Renne Crigler, PA-C  carvedilol (COREG) 12.5 MG tablet Take 1 tablet (12.5 mg total) by mouth 2 (two) times daily. 08/24/17   Fayrene Helper, PA-C  chlordiazePOXIDE (LIBRIUM) 25 MG capsule 50mg  PO TID x 1D, then 25-50mg  PO BID X 1D, then 25-50mg  PO  QD X 1D 02/04/18   Renne Crigler, PA-C  losartan-hydrochlorothiazide (HYZAAR) 100-25 MG tablet Take 1 tablet by mouth daily. 08/24/17   Fayrene Helper, PA-C  ondansetron (ZOFRAN) 4 MG tablet Take 1 tablet (4 mg total) by mouth every 8 (eight) hours as needed for nausea or vomiting. 08/24/17   Fayrene Helper, PA-C  traMADol (ULTRAM) 50 MG tablet Take 1 tablet (50 mg total) by mouth every 6 (six) hours as needed. 03/22/17   Maczis, Elmer Sow, PA-C    Family History No family history on file.  Social History Social History   Tobacco Use  . Smoking status: Never Smoker  . Smokeless tobacco: Never Used  Substance Use Topics  . Alcohol use: Yes    Comment: weekly  . Drug use: No     Allergies   Demerol [meperidine]   Review of Systems Review of Systems  Constitutional: Negative for chills, fatigue and fever.  HENT: Positive for ear pain. Negative for congestion, rhinorrhea, sinus pressure and sore throat.   Eyes: Negative for redness.  Respiratory: Negative for cough and wheezing.   Gastrointestinal: Negative for abdominal pain, diarrhea, nausea and vomiting.  Genitourinary: Negative for dysuria.  Musculoskeletal: Negative for myalgias and neck stiffness.  Skin: Negative for rash.  Neurological: Negative for headaches.  Hematological: Negative for adenopathy.  Psychiatric/Behavioral: Positive for behavioral problems (Alcohol intoxication).     Physical Exam Updated Vital Signs BP (!) 170/122   Pulse (!) 107   Temp 98 F (36.7 C) (Oral)   Resp (!) 22   Ht 5\' 2"  (1.575 m)   Wt 90.7 kg (200 lb)   LMP 01/28/2018   SpO2 97%   BMI 36.58 kg/m   Physical Exam  Constitutional: She appears well-developed and well-nourished.  HENT:  Head: Normocephalic and atraumatic.  Right Ear: External ear and ear canal normal. Tympanic membrane is erythematous and bulging. Tympanic membrane is not perforated.  Left Ear: Tympanic membrane, external ear and ear canal normal. Tympanic membrane is  not perforated, not erythematous and not bulging.  Nose: Nose normal. No mucosal edema or rhinorrhea.  Mouth/Throat: Uvula is midline, oropharynx is clear and moist and mucous membranes are normal. Mucous membranes are not dry. No oral lesions. No trismus in the jaw. No uvula swelling. No oropharyngeal exudate, posterior oropharyngeal edema, posterior oropharyngeal erythema or tonsillar abscesses.  Eyes: Conjunctivae are normal. Right eye exhibits no discharge. Left eye exhibits no discharge.  Neck: Normal range of motion. Neck supple.  Cardiovascular: Normal rate, regular rhythm and normal heart sounds.  Pulmonary/Chest: Effort normal and breath sounds normal. No respiratory distress. She has no wheezes. She has no rales.  Abdominal: Soft. There is no tenderness. There is no rebound and no guarding.  Lymphadenopathy:    She has no cervical adenopathy.  Neurological: She is alert.  Slightly slurred speech consistent with alcohol intoxication.  Skin: Skin is warm and dry.  Psychiatric: She has a normal mood and affect.  Nursing note and vitals reviewed.    ED Treatments / Results  Labs (all labs ordered are listed, but only abnormal results are displayed) Labs Reviewed - No data to display  EKG None  Radiology No results found.  Procedures Procedures (including critical care time)  Medications Ordered in ED Medications  LORazepam (ATIVAN) tablet 0.5 mg (has no administration in time range)     Initial Impression / Assessment and Plan / ED Course  I have reviewed the triage vital signs and the nursing notes.  Pertinent labs & imaging results that were available during my care of the patient were reviewed by me and considered in my medical decision making (see chart for details).     Patient seen and examined.   Vital signs reviewed and are as follows: BP (!) 170/122   Pulse (!) 107   Temp 98 F (36.7 C) (Oral)   Resp (!) 22   Ht 5\' 2"  (1.575 m)   Wt 90.7 kg (200  lb)   LMP 01/28/2018   SpO2 97%   BMI 36.58 kg/m   For ear pain: Likely otitis media. Will treat with azithromycin.  It is tough to know if her previous use of Augmentin represents treatment failure.  Otherwise no signs of otitis externa.  Alcohol use: Patient it has been drinking heavily for several days, mother is rightly concerned about withdrawals.  As patient intends to stop drinking and she will have appropriate supervision, will give course of Librium to help combat withdrawals while she stops.  Strongly encouraged follow-up [facility] as planned.  Counseled not to combine this medication with alcohol.  Discussed risks of respiratory depression.  Final Clinical Impressions(s) / ED Diagnoses   Final diagnoses:  Non-recurrent acute serous otitis media of right ear  Alcoholic intoxication without complication (HCC)   Discussion as above.  ED Discharge Orders        Ordered    chlordiazePOXIDE (LIBRIUM) 25 MG capsule     02/04/18 2108    azithromycin (ZITHROMAX) 250 MG tablet  Daily     02/04/18 2111       Renne Crigler, Cordelia Poche 02/04/18 2118    Maia Plan, MD 02/05/18 1000

## 2018-02-04 NOTE — Discharge Instructions (Signed)
Please read and follow all provided instructions.  Your diagnoses today include:  1. Non-recurrent acute serous otitis media of right ear   2. Alcoholic intoxication without complication (HCC)    Tests performed today include:  Vital signs. See below for your results today.   Medications prescribed:   Azithromycin - antibiotic for respiratory infection  You have been prescribed an antibiotic medicine: take the entire course of medicine even if you are feeling better. Stopping early can cause the antibiotic not to work.   Librium - medication to help suppress alcohol withdrawal  DO NOT COMBINE WITH ALCOHOL. DO NOT drive or perform any activities that require you to be awake and alert because this medicine can make you drowsy.   Take any prescribed medications only as directed.  Home care instructions:  Follow any educational materials contained in this packet.  Follow-up instructions: Please follow-up with your primary care provider in the next 3 days for further evaluation of your symptoms.   Return instructions:   Please return to the Emergency Department if you experience worsening symptoms.   Please return if you have any other emergent concerns.  Additional Information:  Your vital signs today were: BP (!) 170/122    Pulse (!) 107    Temp 98 F (36.7 C) (Oral)    Resp (!) 22    Ht 5\' 2"  (1.575 m)    Wt 90.7 kg (200 lb)    LMP 01/28/2018    SpO2 97%    BMI 36.58 kg/m  If your blood pressure (BP) was elevated above 135/85 this visit, please have this repeated by your doctor within one month. --------------

## 2018-02-04 NOTE — ED Notes (Signed)
ED Provider at bedside. 

## 2018-02-04 NOTE — ED Notes (Signed)
Pt reports taking "left over antibiotics possibly amoxicillin".

## 2018-02-04 NOTE — ED Triage Notes (Signed)
Pain in her right ear. She fell in a swimming pool several days ago and thinks she got water in her ear. She is intoxicated but states she is not here for detox.

## 2018-04-12 DIAGNOSIS — F1021 Alcohol dependence, in remission: Secondary | ICD-10-CM

## 2018-08-28 ENCOUNTER — Other Ambulatory Visit: Payer: Self-pay

## 2018-08-28 ENCOUNTER — Observation Stay (HOSPITAL_BASED_OUTPATIENT_CLINIC_OR_DEPARTMENT_OTHER)
Admission: EM | Admit: 2018-08-28 | Discharge: 2018-08-30 | Disposition: A | Discharge status: Home / Self Care | Payer: BLUE CROSS/BLUE SHIELD | Attending: Internal Medicine | Admitting: Internal Medicine

## 2018-08-28 ENCOUNTER — Encounter (HOSPITAL_BASED_OUTPATIENT_CLINIC_OR_DEPARTMENT_OTHER): Payer: Self-pay | Admitting: Emergency Medicine

## 2018-08-28 DIAGNOSIS — Z79899 Other long term (current) drug therapy: Secondary | ICD-10-CM | POA: Diagnosis not present

## 2018-08-28 DIAGNOSIS — E7800 Pure hypercholesterolemia, unspecified: Secondary | ICD-10-CM | POA: Insufficient documentation

## 2018-08-28 DIAGNOSIS — G43909 Migraine, unspecified, not intractable, without status migrainosus: Secondary | ICD-10-CM | POA: Insufficient documentation

## 2018-08-28 DIAGNOSIS — F10939 Alcohol use, unspecified with withdrawal, unspecified: Secondary | ICD-10-CM | POA: Diagnosis present

## 2018-08-28 DIAGNOSIS — I10 Essential (primary) hypertension: Secondary | ICD-10-CM | POA: Insufficient documentation

## 2018-08-28 DIAGNOSIS — Z23 Encounter for immunization: Secondary | ICD-10-CM | POA: Diagnosis not present

## 2018-08-28 DIAGNOSIS — I6782 Cerebral ischemia: Secondary | ICD-10-CM | POA: Insufficient documentation

## 2018-08-28 DIAGNOSIS — Z885 Allergy status to narcotic agent status: Secondary | ICD-10-CM | POA: Diagnosis not present

## 2018-08-28 DIAGNOSIS — Z6841 Body Mass Index (BMI) 40.0 and over, adult: Secondary | ICD-10-CM | POA: Insufficient documentation

## 2018-08-28 DIAGNOSIS — F10239 Alcohol dependence with withdrawal, unspecified: Principal | ICD-10-CM | POA: Insufficient documentation

## 2018-08-28 HISTORY — DX: Alcohol use, unspecified with withdrawal, unspecified: F10.939

## 2018-08-28 LAB — PREGNANCY, URINE: PREG TEST UR: NEGATIVE

## 2018-08-28 LAB — CBC WITH DIFFERENTIAL/PLATELET
Abs Immature Granulocytes: 0.04 10*3/uL (ref 0.00–0.07)
Basophils Absolute: 0 10*3/uL (ref 0.0–0.1)
Basophils Relative: 0 %
Eosinophils Absolute: 0 10*3/uL (ref 0.0–0.5)
Eosinophils Relative: 0 %
HCT: 38.1 % (ref 36.0–46.0)
Hemoglobin: 12.4 g/dL (ref 12.0–15.0)
Immature Granulocytes: 1 %
LYMPHS PCT: 15 %
Lymphs Abs: 1.1 10*3/uL (ref 0.7–4.0)
MCH: 26.7 pg (ref 26.0–34.0)
MCHC: 32.5 g/dL (ref 30.0–36.0)
MCV: 82.1 fL (ref 80.0–100.0)
Monocytes Absolute: 0.5 10*3/uL (ref 0.1–1.0)
Monocytes Relative: 7 %
Neutro Abs: 5.8 10*3/uL (ref 1.7–7.7)
Neutrophils Relative %: 77 %
Platelets: 209 10*3/uL (ref 150–400)
RBC: 4.64 MIL/uL (ref 3.87–5.11)
RDW: 14.1 % (ref 11.5–15.5)
WBC: 7.5 10*3/uL (ref 4.0–10.5)
nRBC: 0 % (ref 0.0–0.2)

## 2018-08-28 LAB — URINALYSIS, ROUTINE W REFLEX MICROSCOPIC
BILIRUBIN URINE: NEGATIVE
Glucose, UA: NEGATIVE mg/dL
Hgb urine dipstick: NEGATIVE
Ketones, ur: NEGATIVE mg/dL
Leukocytes, UA: NEGATIVE
Nitrite: NEGATIVE
Protein, ur: 30 mg/dL — AB
Specific Gravity, Urine: 1.02 (ref 1.005–1.030)
pH: 7 (ref 5.0–8.0)

## 2018-08-28 LAB — COMPREHENSIVE METABOLIC PANEL
ALT: 22 U/L (ref 0–44)
AST: 30 U/L (ref 15–41)
Albumin: 3.9 g/dL (ref 3.5–5.0)
Alkaline Phosphatase: 96 U/L (ref 38–126)
Anion gap: 10 (ref 5–15)
BUN: 13 mg/dL (ref 6–20)
CO2: 23 mmol/L (ref 22–32)
Calcium: 8.8 mg/dL — ABNORMAL LOW (ref 8.9–10.3)
Chloride: 100 mmol/L (ref 98–111)
Creatinine, Ser: 0.73 mg/dL (ref 0.44–1.00)
GFR calc Af Amer: >60 mL/min (ref >60)
GFR calc non Af Amer: >60 mL/min (ref >60)
GLUCOSE: 127 mg/dL — AB (ref 70–99)
Potassium: 3.5 mmol/L (ref 3.5–5.1)
Sodium: 133 mmol/L — ABNORMAL LOW (ref 135–145)
Total Bilirubin: 0.9 mg/dL (ref 0.3–1.2)
Total Protein: 6.8 g/dL (ref 6.5–8.1)

## 2018-08-28 LAB — URINALYSIS, MICROSCOPIC (REFLEX): WBC UA: NONE SEEN WBC/hpf (ref 0–5)

## 2018-08-28 LAB — ETHANOL: Alcohol, Ethyl (B): <10 mg/dL (ref <10)

## 2018-08-28 LAB — LIPASE, BLOOD: Lipase: 18 U/L (ref 11–51)

## 2018-08-28 MED ORDER — VITAMIN B-1 100 MG PO TABS
100.0000 mg | ORAL_TABLET | Freq: Every day | ORAL | Status: DC
  Administered 2018-08-28: 100 mg via ORAL
  Filled 2018-08-28: qty 1

## 2018-08-28 MED ORDER — LORAZEPAM 2 MG/ML IJ SOLN
0.0000 mg | Freq: Four times a day (QID) | INTRAMUSCULAR | Status: DC
  Administered 2018-08-28: 2 mg via INTRAVENOUS
  Filled 2018-08-28: qty 1

## 2018-08-28 MED ORDER — ACETAMINOPHEN 325 MG PO TABS
650.0000 mg | ORAL_TABLET | Freq: Once | ORAL | Status: AC
  Administered 2018-08-28: 650 mg via ORAL
  Filled 2018-08-28: qty 2

## 2018-08-28 MED ORDER — THIAMINE HCL 100 MG/ML IJ SOLN: 100.0000 mg | Freq: Every day | INTRAMUSCULAR | Status: DC

## 2018-08-28 MED ORDER — PROMETHAZINE HCL 25 MG PO TABS
25.0000 mg | ORAL_TABLET | Freq: Once | ORAL | Status: AC
  Administered 2018-08-28: 25 mg via ORAL
  Filled 2018-08-28: qty 1

## 2018-08-28 MED ORDER — LORAZEPAM 1 MG PO TABS: 0.0000 mg | ORAL_TABLET | Freq: Two times a day (BID) | ORAL | Status: DC

## 2018-08-28 MED ORDER — SODIUM CHLORIDE 0.9 % IV BOLUS
1000.0000 mL | Freq: Once | INTRAVENOUS | Status: AC
  Administered 2018-08-28: 1000 mL via INTRAVENOUS

## 2018-08-28 MED ORDER — LORAZEPAM 2 MG/ML IJ SOLN: 0.0000 mg | Freq: Two times a day (BID) | INTRAMUSCULAR | Status: DC

## 2018-08-28 MED ORDER — LORAZEPAM 1 MG PO TABS
0.0000 mg | ORAL_TABLET | Freq: Four times a day (QID) | ORAL | Status: DC
  Administered 2018-08-28: 1 mg via ORAL
  Filled 2018-08-28: qty 1

## 2018-08-28 NOTE — ED Triage Notes (Signed)
Brought to ED by daughter for Alcohol withdrawal.  Sts pt had seizure like acitvity in the car.  Sts she has had seizures during withdrawal before.  Pt keeps repeating "I'm OK."  Not sure at first about her own birth date. Sts she last drank alcohol today but is not sure.

## 2018-08-28 NOTE — ED Notes (Addendum)
Pts daughter brought pt to ED toinght because she states pt is prone to seizures. No seizure activity noted by family. Pt states she is nauseous and has a headache. SHe states she had a drinking binge for 3 days, with last night being the final night. SHe drank approx 2 bottles of grey goose before quitting.Last drink was at approx 1900 last night. Pts mom states pt ws in bed most of the day until approx 6pm today. She is now AOx4, although she admits she was disorienated when she arrived. Pt denies abdominal pain.Pt states she has only has small amounts of fluids today, under 2 glasses and no food.

## 2018-08-28 NOTE — ED Notes (Signed)
Pt made aware that urine specimen is needed at this time. 

## 2018-08-28 NOTE — ED Notes (Signed)
Report called to Autumn MessingGerri, [facility] [place] [phone]

## 2018-08-28 NOTE — ED Notes (Signed)
ED Provider at bedside. 

## 2018-08-28 NOTE — ED Notes (Signed)
Report given to Tiffany with [facility]. [facility] en route.

## 2018-08-28 NOTE — ED Notes (Signed)
[facility] here at this time 

## 2018-08-28 NOTE — ED Provider Notes (Signed)
[facility] EMERGENCY DEPARTMENT Provider Note   CSN: [id number] Arrival date & time: 08/28/18  1826     History   Chief Complaint Chief Complaint  Patient presents with  . Alcohol withdrawal symptoms    HPI Meredith Park is a 47 y.o. female.  The history is provided by the patient, medical records, a relative and a parent. No language interpreter was used.  Seizures   This is a recurrent problem. The current episode started 1 to 2 hours ago. The problem has been resolved. There was 1 seizure. The most recent episode lasted 30 to 120 seconds. Associated symptoms include nausea and vomiting. Pertinent negatives include no headaches, no speech difficulty, no visual disturbance, no neck stiffness, no chest pain, no cough and no diarrhea. Characteristics include rhythmic jerking. The episode was witnessed. The seizures did not continue in the ED. The seizure(s) had no focality. Possible causes include change in alcohol use. There has been no fever.    Past Medical History:  Diagnosis Date  . Alcohol abuse   . High cholesterol   . Hypertension     There are no active problems to display for this patient.   History reviewed. No pertinent surgical history.   OB History   No obstetric history on file.      Home Medications    Prior to Admission medications   Medication Sig Start Date End Date Taking? Authorizing Provider  SUMAtriptan (IMITREX) 100 MG tablet TAKE 1 TABLET(100 MG) BY MOUTH 1 TIME AS NEEDED FOR MIGRAINE 03/30/17  Yes [provider]  ATORVASTATIN CALCIUM PO Take by mouth.    [provider]  carvedilol (COREG) 12.5 MG tablet Take 1 tablet (12.5 mg total) by mouth 2 (two) times daily. 08/24/17   Fayrene Helperran, [place], PA-C    Family History No family history on file.  Social History Social History   Tobacco Use  . Smoking status: Never Smoker  . Smokeless tobacco: Never Used  Substance Use Topics  . Alcohol use: Yes   Comment: liquor daily  . Drug use: No     Allergies   Demerol [meperidine]   Review of Systems Review of Systems  Constitutional: Positive for diaphoresis and fatigue. Negative for chills and fever.  HENT: Negative for congestion and rhinorrhea.   Eyes: Negative for visual disturbance.  Respiratory: Negative for cough, chest tightness, shortness of breath, wheezing and stridor.   Cardiovascular: Negative for chest pain, palpitations and leg swelling.  Gastrointestinal: Positive for nausea and vomiting. Negative for abdominal pain, constipation and diarrhea.  Genitourinary: Negative for dysuria, flank pain and frequency.  Musculoskeletal: Negative for back pain, neck pain and neck stiffness.  Skin: Negative for rash and wound.  Neurological: Positive for seizures. Negative for syncope, speech difficulty, weakness, light-headedness, numbness and headaches.  Psychiatric/Behavioral: Negative for agitation.  All other systems reviewed and are negative.    Physical Exam Updated Vital Signs BP (!) 154/102 (BP Location: Left Arm)   Pulse 86   Temp 98.5 F (36.9 C) (Oral)   Resp 20   Ht 5\' 3"  (1.6 m)   Wt 105.9 kg   LMP 08/14/2018   SpO2 96%   BMI 41.36 kg/m   Physical Exam Vitals signs and nursing note reviewed.  Constitutional:      General: She is not in acute distress.    Appearance: She is well-developed. She is diaphoretic. She is not ill-appearing or toxic-appearing.  HENT:     Head: Normocephalic and atraumatic.  Right Ear: External ear normal.     Left Ear: External ear normal.     Nose: Nose normal. No congestion or rhinorrhea.     Mouth/Throat:     Pharynx: No oropharyngeal exudate.  Eyes:     Conjunctiva/sclera: Conjunctivae normal.     Pupils: Pupils are equal, round, and reactive to light.  Neck:     Musculoskeletal: Normal range of motion and neck supple. No neck rigidity or muscular tenderness.     Vascular: No carotid bruit.  Cardiovascular:      Rate and Rhythm: Tachycardia present.     Heart sounds: No murmur.  Pulmonary:     Effort: Pulmonary effort is normal. No respiratory distress.     Breath sounds: Normal breath sounds. No stridor. No wheezing, rhonchi or rales.  Chest:     Chest wall: No tenderness.  Abdominal:     General: Abdomen is flat. There is no distension.     Tenderness: There is no abdominal tenderness. There is no rebound.  Lymphadenopathy:     Cervical: No cervical adenopathy.  Skin:    General: Skin is warm.     Coloration: Skin is not jaundiced.     Findings: No erythema or rash.  Neurological:     General: No focal deficit present.     Mental Status: She is alert and oriented to person, place, and time.     Cranial Nerves: No cranial nerve deficit.     Sensory: No sensory deficit.     Motor: No weakness or abnormal muscle tone.     Coordination: Coordination normal.     Deep Tendon Reflexes: Reflexes are normal and symmetric.  Psychiatric:        Mood and Affect: Mood normal.      ED Treatments / Results  Labs (all labs ordered are listed, but only abnormal results are displayed) Labs Reviewed  PREGNANCY, URINE - Abnormal; Notable for the following components:      Result Value   Preg Test, Ur   (*)    Value: PATIENT IDENTIFICATION ERROR. PLEASE DISREGARD RESULTS. ACCOUNT WILL BE CREDITED.   All other components within normal limits  COMPREHENSIVE METABOLIC PANEL - Abnormal; Notable for the following components:   Sodium 133 (*)    Glucose, Bld 127 (*)    Calcium 8.8 (*)    All other components within normal limits  URINALYSIS, ROUTINE W REFLEX MICROSCOPIC - Abnormal; Notable for the following components:   Color, Urine   (*)    Value: PATIENT IDENTIFICATION ERROR. PLEASE DISREGARD RESULTS. ACCOUNT WILL BE CREDITED.   APPearance   (*)    Value: PATIENT IDENTIFICATION ERROR. PLEASE DISREGARD RESULTS. ACCOUNT WILL BE CREDITED.   Glucose, UA   (*)    Value: PATIENT IDENTIFICATION ERROR.  PLEASE DISREGARD RESULTS. ACCOUNT WILL BE CREDITED.   Hgb urine dipstick   (*)    Value: PATIENT IDENTIFICATION ERROR. PLEASE DISREGARD RESULTS. ACCOUNT WILL BE CREDITED.   Bilirubin Urine   (*)    Value: PATIENT IDENTIFICATION ERROR. PLEASE DISREGARD RESULTS. ACCOUNT WILL BE CREDITED.   Ketones, ur   (*)    Value: PATIENT IDENTIFICATION ERROR. PLEASE DISREGARD RESULTS. ACCOUNT WILL BE CREDITED.   Protein, ur   (*)    Value: PATIENT IDENTIFICATION ERROR. PLEASE DISREGARD RESULTS. ACCOUNT WILL BE CREDITED.   Nitrite   (*)    Value: PATIENT IDENTIFICATION ERROR. PLEASE DISREGARD RESULTS. ACCOUNT WILL BE CREDITED.   Leukocytes, UA   (*)  Value: PATIENT IDENTIFICATION ERROR. PLEASE DISREGARD RESULTS. ACCOUNT WILL BE CREDITED.   All other components within normal limits  URINALYSIS, MICROSCOPIC (REFLEX) - Abnormal; Notable for the following components:   Bacteria, UA   (*)    Value: PATIENT IDENTIFICATION ERROR. PLEASE DISREGARD RESULTS. ACCOUNT WILL BE CREDITED.   All other components within normal limits  URINALYSIS, ROUTINE W REFLEX MICROSCOPIC - Abnormal; Notable for the following components:   Protein, ur 30 (*)    All other components within normal limits  URINALYSIS, MICROSCOPIC (REFLEX) - Abnormal; Notable for the following components:   Bacteria, UA RARE (*)    All other components within normal limits  CBC WITH DIFFERENTIAL/PLATELET  LIPASE, BLOOD  ETHANOL  PREGNANCY, URINE    EKG None  Radiology No results found.  Procedures Procedures (including critical care time)  Medications Ordered in ED Medications  LORazepam (ATIVAN) injection 0-4 mg ( Intravenous See Alternative 08/28/18 2242)    Or  LORazepam (ATIVAN) tablet 0-4 mg (1 mg Oral Given 08/28/18 2242)  LORazepam (ATIVAN) injection 0-4 mg (has no administration in time range)    Or  LORazepam (ATIVAN) tablet 0-4 mg (has no administration in time range)  thiamine (VITAMIN B-1) tablet 100 mg (100 mg Oral Given  08/28/18 2020)    Or  thiamine (B-1) injection 100 mg ( Intravenous See Alternative 08/28/18 2020)  promethazine (PHENERGAN) tablet 25 mg (25 mg Oral Given 08/28/18 1951)  sodium chloride 0.9 % bolus 1,000 mL (0 mLs Intravenous Stopped 08/28/18 2050)  acetaminophen (TYLENOL) tablet 650 mg (650 mg Oral Given 08/28/18 2241)     Initial Impression / Assessment and Plan / ED Course  I have reviewed the triage vital signs and the nursing notes.  Pertinent labs & imaging results that were available during my care of the patient were reviewed by me and considered in my medical decision making (see chart for details).     Meredith Bridgemanngela Faith Gonet is a 47 y.o. female with a past medical history significant for hypertension, hypercholesterolemia, and alcohol abuse with prior alcohol withdrawal seizures who presents with alcohol withdrawal seizure.  Patient is brought in by family of daughter and mother who reports that patient has been drinking significant alcohol for the last several days.  They report that she quit last night and then had what they were concerned was an alcohol withdrawal seizure this evening.  Patient says that she has not had much to drink in the last month until several days ago when she started drinking multiple bottles of liquor at day.  She says her last drink was last night.  Throughout the day she is been feeling more agitated, shaky, diaphoretic at times, and then when her family was going to bring her over here she had what was felt to be an alcohol withdrawal seizure in the car.  Patient had some rocking and shaking for less than 1 minute and then was confused afterwards for period of time.  Patient did not bite her lip and did not lose control of bowel or bladder.  Given the patient's history of alcohol withdrawal seizure and similar presentation I suspect that she may have had one tonight.  Patient otherwise has had some mild diarrhea recently but otherwise denies fevers, chills,  congestion, cough, abdominal pain or back pain.  She does say that she has had some nausea and vomiting today in the setting of her withdrawal.  She denies any drug use or other complaints.  On exam, patient is  tremulous and slightly diaphoretic.  Patient is not tachycardic on arrival.  Patient's lungs are clear and chest is nontender.  Abdomen is nontender.  No focal neurologic deficits or traumatic injury seen.  On exam.  Clinical suspect patient had alcohol withdrawal seizure.  Patient will be given Ativan and have a CIWA score calculated.  Will get laboratory testing.  Given her high concern for seizure, patient will likely be admitted for monitoring.  9:54 PM Patient is feeling much better after the Ativan and fluids.  Patient's work-up was overall assuring.  Alcohol level was undetectable.  This fits with the patient's concern for withdrawal seizure.  Patient still concerned about having another seizure.  Given her history, hospitalist team will be called for admission for further monitoring and management.  Patient will be admitted to hospitalist service for further management.   Final Clinical Impressions(s) / ED Diagnoses   Final diagnoses:  Alcohol withdrawal syndrome with complication [facility])    ED Discharge Orders    None     Clinical Impression: 1. Alcohol withdrawal syndrome with complication (HCC)     Disposition: Admit  This note was prepared with assistance of Dragon voice recognition software. Occasional wrong-word or sound-a-like substitutions may have occurred due to the inherent limitations of voice recognition software.     Ayame Rena, Canary Brim, MD 08/28/18 [phone]

## 2018-08-29 ENCOUNTER — Encounter (HOSPITAL_COMMUNITY): Payer: Self-pay | Admitting: Internal Medicine

## 2018-08-29 ENCOUNTER — Observation Stay (HOSPITAL_COMMUNITY): Payer: BLUE CROSS/BLUE SHIELD

## 2018-08-29 DIAGNOSIS — I10 Essential (primary) hypertension: Secondary | ICD-10-CM | POA: Diagnosis not present

## 2018-08-29 DIAGNOSIS — F10239 Alcohol dependence with withdrawal, unspecified: Secondary | ICD-10-CM | POA: Diagnosis not present

## 2018-08-29 DIAGNOSIS — G43909 Migraine, unspecified, not intractable, without status migrainosus: Secondary | ICD-10-CM | POA: Diagnosis not present

## 2018-08-29 DIAGNOSIS — Z23 Encounter for immunization: Secondary | ICD-10-CM | POA: Diagnosis not present

## 2018-08-29 LAB — COMPREHENSIVE METABOLIC PANEL
ALT: 19 U/L (ref 0–44)
AST: 22 U/L (ref 15–41)
Albumin: 3.5 g/dL (ref 3.5–5.0)
Alkaline Phosphatase: 78 U/L (ref 38–126)
Anion gap: 13 (ref 5–15)
BUN: 16 mg/dL (ref 6–20)
CO2: 22 mmol/L (ref 22–32)
Calcium: 8.6 mg/dL — ABNORMAL LOW (ref 8.9–10.3)
Chloride: 102 mmol/L (ref 98–111)
Creatinine, Ser: 0.73 mg/dL (ref 0.44–1.00)
GFR calc Af Amer: >60 mL/min (ref >60)
GFR calc non Af Amer: >60 mL/min (ref >60)
Glucose, Bld: 101 mg/dL — ABNORMAL HIGH (ref 70–99)
Potassium: 3 mmol/L — ABNORMAL LOW (ref 3.5–5.1)
Sodium: 137 mmol/L (ref 135–145)
Total Bilirubin: 0.6 mg/dL (ref 0.3–1.2)
Total Protein: 6 g/dL — ABNORMAL LOW (ref 6.5–8.1)

## 2018-08-29 LAB — CBC WITH DIFFERENTIAL/PLATELET
Abs Immature Granulocytes: 0.03 10*3/uL (ref 0.00–0.07)
BASOS ABS: 0 10*3/uL (ref 0.0–0.1)
Basophils Relative: 0 %
Eosinophils Absolute: 0.1 10*3/uL (ref 0.0–0.5)
Eosinophils Relative: 1 %
HCT: 35.4 % — ABNORMAL LOW (ref 36.0–46.0)
Hemoglobin: 11.2 g/dL — ABNORMAL LOW (ref 12.0–15.0)
Immature Granulocytes: 1 %
LYMPHS ABS: 1.7 10*3/uL (ref 0.7–4.0)
Lymphocytes Relative: 32 %
MCH: 26.8 pg (ref 26.0–34.0)
MCHC: 31.6 g/dL (ref 30.0–36.0)
MCV: 84.7 fL (ref 80.0–100.0)
Monocytes Absolute: 0.6 10*3/uL (ref 0.1–1.0)
Monocytes Relative: 11 %
NRBC: 0 % (ref 0.0–0.2)
Neutro Abs: 3 10*3/uL (ref 1.7–7.7)
Neutrophils Relative %: 55 %
Platelets: 170 10*3/uL (ref 150–400)
RBC: 4.18 MIL/uL (ref 3.87–5.11)
RDW: 14.1 % (ref 11.5–15.5)
WBC: 5.4 10*3/uL (ref 4.0–10.5)

## 2018-08-29 LAB — HIV ANTIBODY (ROUTINE TESTING W REFLEX): HIV Screen 4th Generation wRfx: NONREACTIVE

## 2018-08-29 MED ORDER — POTASSIUM CHLORIDE CRYS ER 20 MEQ PO TBCR
40.0000 meq | EXTENDED_RELEASE_TABLET | Freq: Once | ORAL | Status: AC
  Administered 2018-08-29: 40 meq via ORAL
  Filled 2018-08-29: qty 2

## 2018-08-29 MED ORDER — LORAZEPAM 1 MG PO TABS
0.0000 mg | ORAL_TABLET | Freq: Four times a day (QID) | ORAL | Status: DC
  Administered 2018-08-29: 1 mg via ORAL
  Filled 2018-08-29: qty 1

## 2018-08-29 MED ORDER — SODIUM CHLORIDE 0.9 % IV SOLN
INTRAVENOUS | Status: AC
  Administered 2018-08-29 (×2): via INTRAVENOUS

## 2018-08-29 MED ORDER — LORAZEPAM 2 MG/ML IJ SOLN: 1.0000 mg | Freq: Four times a day (QID) | INTRAMUSCULAR | Status: DC | PRN

## 2018-08-29 MED ORDER — ONDANSETRON HCL 4 MG/2ML IJ SOLN
4.0000 mg | Freq: Four times a day (QID) | INTRAMUSCULAR | Status: DC | PRN
  Administered 2018-08-29: 4 mg via INTRAVENOUS
  Filled 2018-08-29: qty 2

## 2018-08-29 MED ORDER — ADULT MULTIVITAMIN W/MINERALS CH
1.0000 | ORAL_TABLET | Freq: Every day | ORAL | Status: DC
  Administered 2018-08-29 – 2018-08-30 (×2): 1 via ORAL
  Filled 2018-08-29 (×2): qty 1

## 2018-08-29 MED ORDER — ACETAMINOPHEN 325 MG PO TABS: 650.0000 mg | ORAL_TABLET | Freq: Four times a day (QID) | ORAL | Status: DC | PRN

## 2018-08-29 MED ORDER — LOSARTAN POTASSIUM 50 MG PO TABS
100.0000 mg | ORAL_TABLET | Freq: Every day | ORAL | Status: DC
  Administered 2018-08-29 – 2018-08-30 (×2): 100 mg via ORAL
  Filled 2018-08-29 (×2): qty 2

## 2018-08-29 MED ORDER — THIAMINE HCL 100 MG/ML IJ SOLN: 100.0000 mg | Freq: Every day | INTRAMUSCULAR | Status: DC

## 2018-08-29 MED ORDER — VITAMIN B-1 100 MG PO TABS
100.0000 mg | ORAL_TABLET | Freq: Every day | ORAL | Status: DC
  Administered 2018-08-29 – 2018-08-30 (×2): 100 mg via ORAL
  Filled 2018-08-29 (×2): qty 1

## 2018-08-29 MED ORDER — PNEUMOCOCCAL VAC POLYVALENT 25 MCG/0.5ML IJ INJ
0.5000 mL | INJECTION | INTRAMUSCULAR | Status: AC
  Administered 2018-08-30: 0.5 mL via INTRAMUSCULAR
  Filled 2018-08-29: qty 0.5

## 2018-08-29 MED ORDER — KETOROLAC TROMETHAMINE 15 MG/ML IJ SOLN
15.0000 mg | Freq: Once | INTRAMUSCULAR | Status: AC
  Administered 2018-08-29: 15 mg via INTRAVENOUS
  Filled 2018-08-29: qty 1

## 2018-08-29 MED ORDER — FOLIC ACID 1 MG PO TABS
1.0000 mg | ORAL_TABLET | Freq: Every day | ORAL | Status: DC
  Administered 2018-08-29 – 2018-08-30 (×2): 1 mg via ORAL
  Filled 2018-08-29 (×2): qty 1

## 2018-08-29 MED ORDER — ACETAMINOPHEN 650 MG RE SUPP: 650.0000 mg | Freq: Four times a day (QID) | RECTAL | Status: DC | PRN

## 2018-08-29 MED ORDER — LORAZEPAM 1 MG PO TABS: 0.0000 mg | ORAL_TABLET | Freq: Two times a day (BID) | ORAL | Status: DC

## 2018-08-29 MED ORDER — ONDANSETRON HCL 4 MG PO TABS: 4.0000 mg | ORAL_TABLET | Freq: Four times a day (QID) | ORAL | Status: DC | PRN

## 2018-08-29 MED ORDER — LORAZEPAM 1 MG PO TABS: 1.0000 mg | ORAL_TABLET | Freq: Four times a day (QID) | ORAL | Status: DC | PRN

## 2018-08-29 MED ORDER — CARVEDILOL 12.5 MG PO TABS
12.5000 mg | ORAL_TABLET | Freq: Two times a day (BID) | ORAL | Status: DC
  Administered 2018-08-29 – 2018-08-30 (×3): 12.5 mg via ORAL
  Filled 2018-08-29 (×3): qty 1

## 2018-08-29 NOTE — H&P (Signed)
History and Physical    Meredith Park RUE:[id number] DOB: 04/23/1971 DOA: 08/28/2018  PCP: Burnis MedinFulbright, [place] E, PA-C  Patient coming from: Home.  Chief Complaint: Possible seizure.  HPI: Meredith Bridgemanngela Faith Balboni is a 47 y.o. female with history of alcoholism, hypertension, migraine stopped drinking 2 days ago and started having tremors and vomiting and diarrhea.  Since it was persistent patient's family decided to bring patient to the ER.  On the way to the ER patient had a seizure-like episode witnessed by the family.  ED Course: In the ER patient had mild tremors labs are largely unremarkable.  Given the symptoms and possible withdrawals from alcohol patient admitted for further observation.  After admission patient is complaining of headache which is more like her migraine.  I have ordered 1 dose of Toradol and CT head is pending.  Review of Systems: As per HPI, rest all negative.   Past Medical History:  Diagnosis Date  . Alcohol abuse   . High cholesterol   . Hypertension     History reviewed. No pertinent surgical history.   reports that she has never smoked. She has never used smokeless tobacco. She reports current alcohol use. She reports that she does not use drugs.  Allergies  Allergen Reactions  . Demerol [Meperidine]     Family History  Family history unknown: Yes    Prior to Admission medications   Medication Sig Start Date End Date Taking? Authorizing Provider  SUMAtriptan (IMITREX) 100 MG tablet TAKE 1 TABLET(100 MG) BY MOUTH 1 TIME AS NEEDED FOR MIGRAINE 03/30/17  Yes [provider]  ATORVASTATIN CALCIUM PO Take by mouth.    [provider]  carvedilol (COREG) 12.5 MG tablet Take 1 tablet (12.5 mg total) by mouth 2 (two) times daily. 08/24/17   Fayrene Helperran, [place], PA-C    Physical Exam: Vitals:   08/28/18 2228 08/28/18 2235 08/28/18 2249 08/28/18 2352  BP: 127/90 127/90 127/90 (!) 145/83  Pulse: 84 84 84 75  Resp: 16  16 18   Temp: 98.6  F (37 C)  98.6 F (37 C) 98.1 F (36.7 C)  TempSrc: Oral   Oral  SpO2: 98%  99% 96%  Weight:    107.1 kg  Height:    5\' 2"  (1.575 m)      Constitutional: Moderately built and nourished. Vitals:   08/28/18 2228 08/28/18 2235 08/28/18 2249 08/28/18 2352  BP: 127/90 127/90 127/90 (!) 145/83  Pulse: 84 84 84 75  Resp: 16  16 18   Temp: 98.6 F (37 C)  98.6 F (37 C) 98.1 F (36.7 C)  TempSrc: Oral   Oral  SpO2: 98%  99% 96%  Weight:    107.1 kg  Height:    5\' 2"  (1.575 m)   Eyes: Anicteric no pallor. ENMT: No discharge from the ears eyes nose or mouth. Neck: No mass felt.  No neck rigidity. Respiratory: No rhonchi or crepitations. Cardiovascular: S1-S2 heard. Abdomen: Soft nontender bowel sounds present. Musculoskeletal: No edema.  No joint effusion. Skin: No rash. Neurologic: Alert awake oriented to time place and person.  Moves all extremities. Psychiatric: Appears normal.   Labs on Admission: I have personally reviewed following labs and imaging studies  CBC: Recent Labs  Lab 08/28/18 1945  WBC 7.5  NEUTROABS 5.8  HGB 12.4  HCT 38.1  MCV 82.1  PLT 209   Basic Metabolic Panel: Recent Labs  Lab 08/28/18 1945  NA 133*  K 3.5  CL 100  CO2  23  GLUCOSE 127*  BUN 13  CREATININE 0.73  CALCIUM 8.8*   GFR: Estimated Creatinine Clearance: 101.1 mL/min (by C-G formula based on SCr of 0.73 mg/dL). Liver Function Tests: Recent Labs  Lab 08/28/18 1945  AST 30  ALT 22  ALKPHOS 96  BILITOT 0.9  PROT 6.8  ALBUMIN 3.9   Recent Labs  Lab 08/28/18 1945  LIPASE 18   No results for input(s): AMMONIA in the last 168 hours. Coagulation Profile: No results for input(s): INR, PROTIME in the last 168 hours. Cardiac Enzymes: No results for input(s): CKTOTAL, CKMB, CKMBINDEX, TROPONINI in the last 168 hours. BNP (last 3 results) No results for input(s): PROBNP in the last 8760 hours. HbA1C: No results for input(s): HGBA1C in the last 72 hours. CBG: No  results for input(s): GLUCAP in the last 168 hours. Lipid Profile: No results for input(s): CHOL, HDL, LDLCALC, TRIG, CHOLHDL, LDLDIRECT in the last 72 hours. Thyroid Function Tests: No results for input(s): TSH, T4TOTAL, FREET4, T3FREE, THYROIDAB in the last 72 hours. Anemia Panel: No results for input(s): VITAMINB12, FOLATE, FERRITIN, TIBC, IRON, RETICCTPCT in the last 72 hours. Urine analysis:    Component Value Date/Time   COLORURINE YELLOW 08/28/2018 2051   APPEARANCEUR CLEAR 08/28/2018 2051   LABSPEC 1.020 08/28/2018 2051   PHURINE 7.0 08/28/2018 2051   GLUCOSEU NEGATIVE 08/28/2018 2051   HGBUR NEGATIVE 08/28/2018 2051   BILIRUBINUR NEGATIVE 08/28/2018 2051   KETONESUR NEGATIVE 08/28/2018 2051   PROTEINUR 30 (A) 08/28/2018 2051   NITRITE NEGATIVE 08/28/2018 2051   LEUKOCYTESUR NEGATIVE 08/28/2018 2051   Sepsis Labs: @LABRCNTIP (procalcitonin:4,lacticidven:4) )No results found for this or any previous visit (from the past 240 hour(s)).   Radiological Exams on Admission: No results found.    Assessment/Plan Principal Problem:   Alcohol withdrawal (HCC)    1. Alcohol withdrawal -Place patient on CIWA protocol.  Patient likely could have had a seizure from alcohol withdrawal.  CT head is pending.  Continue to monitor in telemetry.  EKG is pending. 2. Headache likely from migraine for which I have ordered Toradol 1 dose.  CT head is pending.  Continue to monitor.  Appears nonfocal. 3. Hypertension on ARB Coreg hydrochlorothiazide.  Hold hydrochlorothiazide since patient is receiving fluids.  EKG is pending.   DVT prophylaxis: SCDs until we get CT head results. Code Status: Full code. Family Communication: Discussed with patient. Disposition Plan: Home. Consults called: None. Admission status: Observation.   Eduard ClosArshad N Kaydra Borgen MD [facility] Pager [phone].  If 7PM-7AM, please contact night-coverage [email or web address] Password [email or web address]  08/29/2018, 12:26  AM

## 2018-08-29 NOTE — Progress Notes (Signed)
Patient ID: Meredith Park, female   DOB: 05/12/1971, 47 y.o.   MRN: [id number] Patient was admitted early this morning for alcohol withdrawal and question of seizure.  She was started on alcohol withdrawal protocol.  CT of the head was negative for acute abnormality.  Patient seen and examined at bedside and plan of care discussed with her.  Her medical records including this morning's H&P was reviewed myself.  Continue current plan of care.  Repeat a.m. labs.  If she feels better by tomorrow, might consider discharging her home.

## 2018-08-30 DIAGNOSIS — I10 Essential (primary) hypertension: Secondary | ICD-10-CM | POA: Diagnosis not present

## 2018-08-30 DIAGNOSIS — F10239 Alcohol dependence with withdrawal, unspecified: Secondary | ICD-10-CM | POA: Diagnosis not present

## 2018-08-30 LAB — CBC WITH DIFFERENTIAL/PLATELET
Abs Immature Granulocytes: 0.01 10*3/uL (ref 0.00–0.07)
Basophils Absolute: 0 10*3/uL (ref 0.0–0.1)
Basophils Relative: 1 %
Eosinophils Absolute: 0.2 10*3/uL (ref 0.0–0.5)
Eosinophils Relative: 4 %
HCT: 35.2 % — ABNORMAL LOW (ref 36.0–46.0)
Hemoglobin: 11.2 g/dL — ABNORMAL LOW (ref 12.0–15.0)
Immature Granulocytes: 0 %
Lymphocytes Relative: 31 %
Lymphs Abs: 1.5 10*3/uL (ref 0.7–4.0)
MCH: 27 pg (ref 26.0–34.0)
MCHC: 31.8 g/dL (ref 30.0–36.0)
MCV: 84.8 fL (ref 80.0–100.0)
MONO ABS: 0.4 10*3/uL (ref 0.1–1.0)
Monocytes Relative: 8 %
Neutro Abs: 2.8 10*3/uL (ref 1.7–7.7)
Neutrophils Relative %: 56 %
PLATELETS: 107 10*3/uL — AB (ref 150–400)
RBC: 4.15 MIL/uL (ref 3.87–5.11)
RDW: 14 % (ref 11.5–15.5)
WBC: 4.9 10*3/uL (ref 4.0–10.5)
nRBC: 0 % (ref 0.0–0.2)

## 2018-08-30 LAB — COMPREHENSIVE METABOLIC PANEL
ALT: 18 U/L (ref 0–44)
AST: 26 U/L (ref 15–41)
Albumin: 3.4 g/dL — ABNORMAL LOW (ref 3.5–5.0)
Alkaline Phosphatase: 79 U/L (ref 38–126)
Anion gap: 11 (ref 5–15)
BUN: 13 mg/dL (ref 6–20)
CALCIUM: 8.1 mg/dL — AB (ref 8.9–10.3)
CHLORIDE: 109 mmol/L (ref 98–111)
CO2: 19 mmol/L — AB (ref 22–32)
CREATININE: 0.71 mg/dL (ref 0.44–1.00)
GFR calc Af Amer: >60 mL/min (ref >60)
GFR calc non Af Amer: >60 mL/min (ref >60)
Glucose, Bld: 117 mg/dL — ABNORMAL HIGH (ref 70–99)
Potassium: 4.3 mmol/L (ref 3.5–5.1)
Sodium: 139 mmol/L (ref 135–145)
Total Bilirubin: 0.5 mg/dL (ref 0.3–1.2)
Total Protein: 5.9 g/dL — ABNORMAL LOW (ref 6.5–8.1)

## 2018-08-30 LAB — MAGNESIUM: Magnesium: 1.7 mg/dL (ref 1.7–2.4)

## 2018-08-30 MED ORDER — FOLIC ACID 1 MG PO TABS: 1.0000 mg | ORAL_TABLET | Freq: Every day | ORAL | 0 refills | Status: DC

## 2018-08-30 MED ORDER — ADULT MULTIVITAMIN W/MINERALS CH: 1.0000 | ORAL_TABLET | Freq: Every day | ORAL | Status: DC

## 2018-08-30 MED ORDER — THIAMINE HCL 100 MG PO TABS: 100.0000 mg | ORAL_TABLET | Freq: Every day | ORAL | 0 refills | Status: DC

## 2018-08-30 MED ORDER — ONDANSETRON HCL 4 MG PO TABS: 4.0000 mg | ORAL_TABLET | Freq: Four times a day (QID) | ORAL | 0 refills | Status: DC | PRN

## 2018-08-30 NOTE — Discharge Summary (Signed)
Physician Discharge Summary  Mallerie Blok ZOX:[id number] DOB: [birth year] DOA: 08/28/2018  PCP: Burnis Medin, PA-C  Admit date: 08/28/2018 Discharge date: 08/30/2018  Admitted From: Home  disposition: Home  Recommendations for Outpatient Follow-up:  1. Follow up with PCP in 1 week with repeat CBC/CMP 2. Follow-up in the ED if symptoms worsen or new appear 3. Abstain from alcohol   Home Health: No Equipment/Devices: None  Discharge Condition: Stable CODE STATUS: Full Diet recommendation: Heart Healthy  Brief/Interim Summary: 47 year old female with history of alcoholism, hypertension, migraine presented with question of possible seizures.  She was admitted for alcohol withdrawal.  No further episodes of any more seizures during the hospitalization.  CT of the head was negative for acute abnormality.  She has felt much better.  She will be discharged home with outpatient follow-up.  Discharge Diagnoses:  Principal Problem:   Alcohol withdrawal (HCC)  Alcohol withdrawal -Treated with CIWA protocol.  Unclear if the patient really had a seizure.  She does not remember it.  No further seizure episodes since admission.  CT of the head was negative for any acute abnormality.  Currently not tremulous.  Continue multivitamin, thiamine, folate on discharge.  Outpatient follow-up with PCP.  Abstain from alcohol  Hypertension -Continue home regimen on discharge.  Monitor as an outpatient  Morbid obesity -Outpatient follow-up  Discharge Instructions  Discharge Instructions    Call MD for:  difficulty breathing, headache or visual disturbances   Complete by:  As directed    Call MD for:  extreme fatigue   Complete by:  As directed    Call MD for:  hives   Complete by:  As directed    Call MD for:  persistant dizziness or light-headedness   Complete by:  As directed    Call MD for:  persistant nausea and vomiting   Complete by:  As directed    Call MD for:  severe  uncontrolled pain   Complete by:  As directed    Call MD for:  temperature >100.4   Complete by:  As directed    Diet - low sodium heart healthy   Complete by:  As directed    Increase activity slowly   Complete by:  As directed      Allergies as of 08/30/2018      Reactions   Meperidine Itching      Medication List    TAKE these medications   carvedilol 12.5 MG tablet Commonly known as:  COREG Take 1 tablet (12.5 mg total) by mouth 2 (two) times daily.   folic acid 1 MG tablet Commonly known as:  FOLVITE Take 1 tablet (1 mg total) by mouth daily.   losartan-hydrochlorothiazide 100-25 MG tablet Commonly known as:  HYZAAR Take 1 tablet by mouth daily.   multivitamin with minerals Tabs tablet Take 1 tablet by mouth daily.   ondansetron 4 MG tablet Commonly known as:  ZOFRAN Take 1 tablet (4 mg total) by mouth every 6 (six) hours as needed for nausea.   oxymetazoline 0.05 % nasal spray Commonly known as:  AFRIN Place 1 spray into both nostrils 2 (two) times daily as needed for congestion.   SUMAtriptan 100 MG tablet Commonly known as:  IMITREX Take 100 mg by mouth as needed for migraine.   thiamine 100 MG tablet Take 1 tablet (100 mg total) by mouth daily.      Follow-up Information    [facility], [place], [place]. Schedule an appointment as soon as  possible for a visit in 1 week(s).   Specialty:  Family Medicine Why:  with repeat cbc/cmp Contact information: [id number] [place] [address] [phone]          Allergies  Allergen Reactions  . Meperidine Itching    Consultations:  None   Procedures/Studies: Ct Head Wo Contrast  Result Date: 08/29/2018 CLINICAL DATA:  Severe LEFT periorbital headaches. History of migraines. EXAM: CT HEAD WITHOUT CONTRAST TECHNIQUE: Contiguous axial images were obtained from the base of the skull through the vertex without intravenous contrast. COMPARISON:  CT HEAD December 07, 2017 FINDINGS: BRAIN: No  intraparenchymal hemorrhage, mass effect nor midline shift. Mild parenchymal brain volume loss. Patchy supratentorial white matter hypodensities. No acute large vascular territory infarcts. No abnormal extra-axial fluid collections. Basal cisterns are patent. VASCULAR: Trace calcific atherosclerosis carotid siphons. SKULL/SOFT TISSUES: No skull fracture. No significant soft tissue swelling. ORBITS/SINUSES: The included ocular globes and orbital contents are normal.Trace paranasal sinus mucosal thickening. Mastoid air cells are well aerated. OTHER: None. IMPRESSION: 1. No acute intracranial process. 2. Mild parenchymal brain volume loss, advanced for age. 3. Mild chronic small vessel ischemic changes. Electronically Signed   By: Awilda Metroourtnay  Bloomer M.D.   On: 08/29/2018 01:23    Subjective: Patient seen and examined at bedside.  She feels much better and wants to go home.  No overnight fever, nausea or vomiting.  Discharge Exam: Vitals:   08/29/18 2311 08/30/18 0558  BP: (!) 133/94 125/83  Pulse: 83 86  Resp: 16 18  Temp: 98.8 F (37.1 C) 98.1 F (36.7 C)  SpO2: 98% 97%   Vitals:   08/29/18 1418 08/29/18 1900 08/29/18 2311 08/30/18 0558  BP: (!) 154/91 (!) 154/119 (!) 133/94 125/83  Pulse: 85 92 83 86  Resp: 18 18 16 18   Temp: 98.8 F (37.1 C)  98.8 F (37.1 C) 98.1 F (36.7 C)  TempSrc: Oral  Oral Oral  SpO2: 97% 98% 98% 97%  Weight:      Height:        General: Pt is alert, awake, not in acute distress Cardiovascular: rate controlled, S1/S2 + Respiratory: bilateral decreased breath sounds at bases Abdominal: Soft, NT, ND, bowel sounds + Extremities: no edema, no cyanosis    The results of significant diagnostics from this hospitalization (including imaging, microbiology, ancillary and laboratory) are listed below for reference.     Microbiology: No results found for this or any previous visit (from the past 240 hour(s)).   Labs: BNP (last 3 results) No results for  input(s): BNP in the last 8760 hours. Basic Metabolic Panel: Recent Labs  Lab 08/28/18 1945 08/29/18 0435 08/30/18 0448  NA 133* 137 139  K 3.5 3.0* 4.3  CL 100 102 109  CO2 23 22 19*  GLUCOSE 127* 101* 117*  BUN 13 16 13   CREATININE 0.73 0.73 0.71  CALCIUM 8.8* 8.6* 8.1*  MG  --   --  1.7   Liver Function Tests: Recent Labs  Lab 08/28/18 1945 08/29/18 0435 08/30/18 0448  AST 30 22 26   ALT 22 19 18   ALKPHOS 96 78 79  BILITOT 0.9 0.6 0.5  PROT 6.8 6.0* 5.9*  ALBUMIN 3.9 3.5 3.4*   Recent Labs  Lab 08/28/18 1945  LIPASE 18   No results for input(s): AMMONIA in the last 168 hours. CBC: Recent Labs  Lab 08/28/18 1945 08/29/18 0435 08/30/18 0448  WBC 7.5 5.4 4.9  NEUTROABS 5.8 3.0 2.8  HGB 12.4  11.2* 11.2*  HCT 38.1 35.4* 35.2*  MCV 82.1 84.7 84.8  PLT 209 170 107*   Cardiac Enzymes: No results for input(s): CKTOTAL, CKMB, CKMBINDEX, TROPONINI in the last 168 hours. BNP: Invalid input(s): POCBNP CBG: No results for input(s): GLUCAP in the last 168 hours. D-Dimer No results for input(s): DDIMER in the last 72 hours. Hgb A1c No results for input(s): HGBA1C in the last 72 hours. Lipid Profile No results for input(s): CHOL, HDL, LDLCALC, TRIG, CHOLHDL, LDLDIRECT in the last 72 hours. Thyroid function studies No results for input(s): TSH, T4TOTAL, T3FREE, THYROIDAB in the last 72 hours.  Invalid input(s): FREET3 Anemia work up No results for input(s): VITAMINB12, FOLATE, FERRITIN, TIBC, IRON, RETICCTPCT in the last 72 hours. Urinalysis    Component Value Date/Time   COLORURINE YELLOW 08/28/2018 2051   APPEARANCEUR CLEAR 08/28/2018 2051   LABSPEC 1.020 08/28/2018 2051   PHURINE 7.0 08/28/2018 2051   GLUCOSEU NEGATIVE 08/28/2018 2051   HGBUR NEGATIVE 08/28/2018 2051   BILIRUBINUR NEGATIVE 08/28/2018 2051   KETONESUR NEGATIVE 08/28/2018 2051   PROTEINUR 30 (A) 08/28/2018 2051   NITRITE NEGATIVE 08/28/2018 2051   LEUKOCYTESUR NEGATIVE 08/28/2018 2051    Sepsis Labs Invalid input(s): PROCALCITONIN,  WBC,  LACTICIDVEN Microbiology No results found for this or any previous visit (from the past 240 hour(s)).   Time coordinating discharge: 35 minutes  SIGNED:   Glade LloydKshitiz Ara Mano, MD  [facility] 08/30/2018, 10:50 AM Pager: [phone]  If 7PM-7AM, please contact night-coverage [email or web address] Password [email or web address]

## 2018-09-15 ENCOUNTER — Emergency Department (HOSPITAL_BASED_OUTPATIENT_CLINIC_OR_DEPARTMENT_OTHER)
Admission: EM | Admit: 2018-09-15 | Discharge: 2018-09-15 | Disposition: A | Discharge status: Home / Self Care | Payer: BLUE CROSS/BLUE SHIELD | Attending: Emergency Medicine | Admitting: Emergency Medicine

## 2018-09-15 ENCOUNTER — Encounter (HOSPITAL_BASED_OUTPATIENT_CLINIC_OR_DEPARTMENT_OTHER): Payer: Self-pay | Admitting: Adult Health

## 2018-09-15 ENCOUNTER — Other Ambulatory Visit: Payer: Self-pay

## 2018-09-15 DIAGNOSIS — Z79899 Other long term (current) drug therapy: Secondary | ICD-10-CM | POA: Insufficient documentation

## 2018-09-15 DIAGNOSIS — I10 Essential (primary) hypertension: Secondary | ICD-10-CM | POA: Diagnosis not present

## 2018-09-15 DIAGNOSIS — F1010 Alcohol abuse, uncomplicated: Secondary | ICD-10-CM | POA: Diagnosis not present

## 2018-09-15 LAB — COMPREHENSIVE METABOLIC PANEL
ALT: 30 U/L (ref 0–44)
AST: 37 U/L (ref 15–41)
Albumin: 4 g/dL (ref 3.5–5.0)
Alkaline Phosphatase: 105 U/L (ref 38–126)
Anion gap: 12 (ref 5–15)
BUN: 16 mg/dL (ref 6–20)
CO2: 23 mmol/L (ref 22–32)
Calcium: 8.4 mg/dL — ABNORMAL LOW (ref 8.9–10.3)
Chloride: 98 mmol/L (ref 98–111)
Creatinine, Ser: 0.66 mg/dL (ref 0.44–1.00)
GFR calc Af Amer: >60 mL/min (ref >60)
GFR calc non Af Amer: >60 mL/min (ref >60)
Glucose, Bld: 121 mg/dL — ABNORMAL HIGH (ref 70–99)
Potassium: 3.3 mmol/L — ABNORMAL LOW (ref 3.5–5.1)
Sodium: 133 mmol/L — ABNORMAL LOW (ref 135–145)
Total Bilirubin: 0.9 mg/dL (ref 0.3–1.2)
Total Protein: 7 g/dL (ref 6.5–8.1)

## 2018-09-15 MED ORDER — LORAZEPAM 1 MG PO TABS: 1.0000 mg | ORAL_TABLET | Freq: Three times a day (TID) | ORAL | 0 refills | Status: DC | PRN

## 2018-09-15 MED ORDER — METOCLOPRAMIDE HCL 10 MG PO TABS: 10.0000 mg | ORAL_TABLET | Freq: Four times a day (QID) | ORAL | 0 refills | Status: DC | PRN

## 2018-09-15 MED ORDER — ONDANSETRON 4 MG PO TBDP
4.0000 mg | ORAL_TABLET | Freq: Once | ORAL | Status: AC
  Administered 2018-09-15: 4 mg via ORAL
  Filled 2018-09-15: qty 1

## 2018-09-15 MED ORDER — METOCLOPRAMIDE HCL 5 MG/ML IJ SOLN
5.0000 mg | Freq: Once | INTRAMUSCULAR | Status: AC
  Administered 2018-09-15: 5 mg via INTRAVENOUS
  Filled 2018-09-15: qty 2

## 2018-09-15 MED ORDER — POTASSIUM CHLORIDE CRYS ER 20 MEQ PO TBCR
40.0000 meq | EXTENDED_RELEASE_TABLET | Freq: Once | ORAL | Status: AC
  Administered 2018-09-15: 40 meq via ORAL
  Filled 2018-09-15: qty 2

## 2018-09-15 NOTE — ED Notes (Signed)
Pt returned due to Rx sent to [facility] at [place] which was closed. Rx for reglan and lorazepam e-prescribed to [facility] at [address] by Dr. Ethelda Chick. Message left on VM at [facility] [place] to cancel prescriptions that had been sent to them. Pt verbalized understanding

## 2018-09-15 NOTE — ED Notes (Signed)
Pt undischarged to send Rx to open pharmacy

## 2018-09-15 NOTE — ED Notes (Signed)
Pt given water with verbal approval from Dr. Ethelda Chick

## 2018-09-15 NOTE — Discharge Instructions (Addendum)
Call your counselor tomorrow for alcohol abuse.  You can also call any of the numbers on the resource guide.  If you feel the need for detox, please contact your primary care provider to arrange for you

## 2018-09-15 NOTE — ED Provider Notes (Signed)
[facility] EMERGENCY DEPARTMENT Provider Note   CSN: [id number] Arrival date & time: 09/15/18  1537     History   Chief Complaint Chief Complaint  Patient presents with  . Alcohol Intoxication    HPI Meredith Park is a 48 y.o. female.  Patient has is been on a drinking binge for the past 6 days.  She stopped drinking last night at midnight.  She is vomited 4 times since yesterday.  Hematemesis.  She also felt tingling in her fingers earlier today which has since resolved.  She is afraid that she may have a seizure, as she has had alcohol withdrawal seizures in the past approximately 24 hours after having stopped drinking.  No other associated symptoms.  She is presently asymptomatic except feeling of general malaise which she experiences after drinking binge.  She denies lightheadedness on standing.  Nausea has resolved since treatment with Zofran prior to my exam  HPI  Past Medical History:  Diagnosis Date  . Alcohol abuse   . High cholesterol   . Hypertension     Patient Active Problem List   Diagnosis Date Noted  . Alcohol withdrawal (HCC) 08/28/2018    History reviewed. No pertinent surgical history.   OB History   No obstetric history on file.      Home Medications    Prior to Admission medications   Medication Sig Start Date End Date Taking? Authorizing Provider  carvedilol (COREG) 12.5 MG tablet Take 1 tablet (12.5 mg total) by mouth 2 (two) times daily. 08/24/17   Fayrene Helper, PA-C  folic acid (FOLVITE) 1 MG tablet Take 1 tablet (1 mg total) by mouth daily. 08/30/18   Glade Lloyd, MD  losartan-hydrochlorothiazide (HYZAAR) 100-25 MG tablet Take 1 tablet by mouth daily. 03/28/17   [provider]  Multiple Vitamin (MULTIVITAMIN WITH MINERALS) TABS tablet Take 1 tablet by mouth daily. 08/30/18   Glade Lloyd, MD  ondansetron (ZOFRAN) 4 MG tablet Take 1 tablet (4 mg total) by mouth every 6 (six) hours as needed for nausea. 08/30/18    Glade Lloyd, MD  oxymetazoline (AFRIN) 0.05 % nasal spray Place 1 spray into both nostrils 2 (two) times daily as needed for congestion.    [provider]  SUMAtriptan (IMITREX) 100 MG tablet Take 100 mg by mouth as needed for migraine.  03/30/17   [provider]  thiamine 100 MG tablet Take 1 tablet (100 mg total) by mouth daily. 08/30/18   Glade Lloyd, MD    Family History Family History  Family history unknown: Yes    Social History Social History   Tobacco Use  . Smoking status: Never Smoker  . Smokeless tobacco: Never Used  Substance Use Topics  . Alcohol use: Yes    Comment: liquor daily  . Drug use: No     Allergies   Meperidine   Review of Systems Review of Systems  Constitutional:       Malaise  HENT: Negative.   Respiratory: Negative.   Cardiovascular: Negative.   Gastrointestinal: Negative.   Musculoskeletal: Negative.   Skin: Negative.   Neurological:       Tingling in fingers-resolved  Psychiatric/Behavioral: Negative.   All other systems reviewed and are negative.    Physical Exam Updated Vital Signs BP (!) 156/100   Pulse 66   Temp 98.1 F (36.7 C) (Oral)   Resp 15   Ht 5' 2.75" (1.594 m)   Wt 99.8 kg   SpO2 98%  BMI 39.28 kg/m   Physical Exam Vitals signs and nursing note reviewed.  Constitutional:      Appearance: Normal appearance. She is well-developed. She is obese.  HENT:     Head: Normocephalic and atraumatic.  Eyes:     Conjunctiva/sclera: Conjunctivae normal.     Pupils: Pupils are equal, round, and reactive to light.  Neck:     Musculoskeletal: Neck supple.     Thyroid: No thyromegaly.     Trachea: No tracheal deviation.  Cardiovascular:     Rate and Rhythm: Normal rate and regular rhythm.     Heart sounds: No murmur.  Pulmonary:     Effort: Pulmonary effort is normal.     Breath sounds: Normal breath sounds.  Abdominal:     General: Bowel sounds are normal. There is no distension.      Palpations: Abdomen is soft.     Tenderness: There is no abdominal tenderness.     Comments: Obese  Musculoskeletal: Normal range of motion.        General: No tenderness.  Skin:    General: Skin is warm and dry.     Findings: No rash.  Neurological:     Mental Status: She is alert and oriented to person, place, and time.     Coordination: Coordination normal.     Comments: Normal gait.  Not tremulous not lightheaded on standing.  Psychiatric:        Mood and Affect: Mood normal.        Behavior: Behavior normal.      ED Treatments / Results  Labs (all labs ordered are listed, but only abnormal results are displayed) Labs Reviewed  COMPREHENSIVE METABOLIC PANEL    EKG None  Radiology No results found.  Procedures Procedures (including critical care time)  Medications Ordered in ED Medications  ondansetron (ZOFRAN-ODT) disintegrating tablet 4 mg (4 mg Oral Given 09/15/18 1552)    5:45 PM complains of nausea.  IV Reglan ordered. Results for orders placed or performed during the hospital encounter of 09/15/18  Comprehensive metabolic panel  Result Value Ref Range   Sodium 133 (L) 135 - 145 mmol/L   Potassium 3.3 (L) 3.5 - 5.1 mmol/L   Chloride 98 98 - 111 mmol/L   CO2 23 22 - 32 mmol/L   Glucose, Bld 121 (H) 70 - 99 mg/dL   BUN 16 6 - 20 mg/dL   Creatinine, Ser 1.610.66 0.44 - 1.00 mg/dL   Calcium 8.4 (L) 8.9 - 10.3 mg/dL   Total Protein 7.0 6.5 - 8.1 g/dL   Albumin 4.0 3.5 - 5.0 g/dL   AST 37 15 - 41 U/L   ALT 30 0 - 44 U/L   Alkaline Phosphatase 105 38 - 126 U/L   Total Bilirubin 0.9 0.3 - 1.2 mg/dL   GFR calc non Af Amer >60 >60 mL/min   GFR calc Af Amer >60 >60 mL/min   Anion gap 12 5 - 15   Ct Head Wo Contrast  Result Date: 08/29/2018 CLINICAL DATA:  Severe LEFT periorbital headaches. History of migraines. EXAM: CT HEAD WITHOUT CONTRAST TECHNIQUE: Contiguous axial images were obtained from the base of the skull through the vertex without intravenous  contrast. COMPARISON:  CT HEAD December 07, 2017 FINDINGS: BRAIN: No intraparenchymal hemorrhage, mass effect nor midline shift. Mild parenchymal brain volume loss. Patchy supratentorial white matter hypodensities. No acute large vascular territory infarcts. No abnormal extra-axial fluid collections. Basal cisterns are patent. VASCULAR: Trace calcific atherosclerosis carotid  siphons. SKULL/SOFT TISSUES: No skull fracture. No significant soft tissue swelling. ORBITS/SINUSES: The included ocular globes and orbital contents are normal.Trace paranasal sinus mucosal thickening. Mastoid air cells are well aerated. OTHER: None. IMPRESSION: 1. No acute intracranial process. 2. Mild parenchymal brain volume loss, advanced for age. 3. Mild chronic small vessel ischemic changes. Electronically Signed   By: Awilda Metro M.D.   On: 08/29/2018 01:23   Initial Impression / Assessment and Plan / ED Course  I have reviewed the triage vital signs and the nursing notes.  Pertinent labs & imaging results that were available during my care of the patient were reviewed by me and considered in my medical decision making (see chart for details).     7 PM patient no longer nauseated after treatment with intravenous Reglan.  She remains alert Glasgow Coma Score 15 and in no distress.  She is able to drink without vomiting.  Lab work remarkable for mild hypokalemia Prescription Ativan, Reglan.  Should be given resource guide for substance abuse.  She can also follow-up with her counselor tomorrow whom she is used for alcohol abuse.  She can also follow-up with her PMD if if she feels the need for detox.  I stressed to her that she cannot drink alcohol while taking Ativan Final Clinical Impressions(s) / ED Diagnoses  Diagnoses #1 chronic alcohol abuse #2 hypokalemia Final diagnoses:  None    ED Discharge Orders    None       Doug Sou, MD 09/15/18 Windell Moment

## 2018-09-15 NOTE — ED Notes (Addendum)
Pt denies pain. States she is an alcoholic and drank a lot last night. She is requesting medications for nausea. Denies SI/HI. Calm and cooperative. States she has had etoh related seizures in the past and she wants to prevent having one "in the next 24 hours"

## 2018-09-15 NOTE — ED Triage Notes (Signed)
Presents stating, "I am an alcoholic and I drank way to much this week. My last drink was last night. I drank an entire 1/2 gallon of vodka in one day. I am just feeling awful" She endorses HX of withdrawal seizures. AT this time reports bilateral finger tingling and nausea.

## 2018-09-15 NOTE — ED Notes (Signed)
PA student at bedside.

## 2018-09-15 NOTE — ED Notes (Signed)
ED Provider at bedside. Dr. Jacubowitz 

## 2018-09-15 NOTE — ED Notes (Signed)
Patient left at this time with all belongings. 

## 2018-11-10 ENCOUNTER — Other Ambulatory Visit: Payer: Self-pay

## 2018-11-10 ENCOUNTER — Encounter (HOSPITAL_BASED_OUTPATIENT_CLINIC_OR_DEPARTMENT_OTHER): Payer: Self-pay | Admitting: Emergency Medicine

## 2018-11-10 ENCOUNTER — Emergency Department (HOSPITAL_BASED_OUTPATIENT_CLINIC_OR_DEPARTMENT_OTHER)
Admission: EM | Admit: 2018-11-10 | Discharge: 2018-11-10 | Disposition: A | Discharge status: Home / Self Care | Payer: BLUE CROSS/BLUE SHIELD | Attending: Emergency Medicine | Admitting: Emergency Medicine

## 2018-11-10 DIAGNOSIS — R1110 Vomiting, unspecified: Secondary | ICD-10-CM | POA: Diagnosis present

## 2018-11-10 DIAGNOSIS — F1010 Alcohol abuse, uncomplicated: Secondary | ICD-10-CM | POA: Diagnosis not present

## 2018-11-10 DIAGNOSIS — R112 Nausea with vomiting, unspecified: Secondary | ICD-10-CM

## 2018-11-10 DIAGNOSIS — I10 Essential (primary) hypertension: Secondary | ICD-10-CM | POA: Diagnosis not present

## 2018-11-10 LAB — COMPREHENSIVE METABOLIC PANEL
ALT: 37 U/L (ref 0–44)
AST: 44 U/L — ABNORMAL HIGH (ref 15–41)
Albumin: 3.8 g/dL (ref 3.5–5.0)
Alkaline Phosphatase: 100 U/L (ref 38–126)
Anion gap: 10 (ref 5–15)
BUN: 11 mg/dL (ref 6–20)
CO2: 21 mmol/L — ABNORMAL LOW (ref 22–32)
Calcium: 7.8 mg/dL — ABNORMAL LOW (ref 8.9–10.3)
Chloride: 103 mmol/L (ref 98–111)
Creatinine, Ser: 0.74 mg/dL (ref 0.44–1.00)
GFR calc Af Amer: >60 mL/min (ref >60)
GFR calc non Af Amer: >60 mL/min (ref >60)
GLUCOSE: 143 mg/dL — AB (ref 70–99)
Potassium: 4 mmol/L (ref 3.5–5.1)
Sodium: 134 mmol/L — ABNORMAL LOW (ref 135–145)
Total Bilirubin: 1 mg/dL (ref 0.3–1.2)
Total Protein: 6.4 g/dL — ABNORMAL LOW (ref 6.5–8.1)

## 2018-11-10 LAB — CBC WITH DIFFERENTIAL/PLATELET
Abs Immature Granulocytes: 0.03 10*3/uL (ref 0.00–0.07)
Basophils Absolute: 0 10*3/uL (ref 0.0–0.1)
Basophils Relative: 0 %
Eosinophils Absolute: 0 10*3/uL (ref 0.0–0.5)
Eosinophils Relative: 1 %
HEMATOCRIT: 33.9 % — AB (ref 36.0–46.0)
HEMOGLOBIN: 11.3 g/dL — AB (ref 12.0–15.0)
Immature Granulocytes: 1 %
LYMPHS PCT: 13 %
Lymphs Abs: 0.8 10*3/uL (ref 0.7–4.0)
MCH: 27.8 pg (ref 26.0–34.0)
MCHC: 33.3 g/dL (ref 30.0–36.0)
MCV: 83.5 fL (ref 80.0–100.0)
Monocytes Absolute: 0.3 10*3/uL (ref 0.1–1.0)
Monocytes Relative: 5 %
NEUTROS ABS: 4.9 10*3/uL (ref 1.7–7.7)
Neutrophils Relative %: 80 %
Platelets: 147 10*3/uL — ABNORMAL LOW (ref 150–400)
RBC: 4.06 MIL/uL (ref 3.87–5.11)
RDW: 14.7 % (ref 11.5–15.5)
WBC: 6.1 10*3/uL (ref 4.0–10.5)
nRBC: 0 % (ref 0.0–0.2)

## 2018-11-10 LAB — URINALYSIS, ROUTINE W REFLEX MICROSCOPIC
Bilirubin Urine: NEGATIVE
Glucose, UA: NEGATIVE mg/dL
Hgb urine dipstick: NEGATIVE
Ketones, ur: 15 mg/dL — AB
Nitrite: NEGATIVE
PH: 6 (ref 5.0–8.0)
Protein, ur: NEGATIVE mg/dL
Specific Gravity, Urine: 1.025 (ref 1.005–1.030)

## 2018-11-10 LAB — LIPASE, BLOOD: Lipase: 19 U/L (ref 11–51)

## 2018-11-10 LAB — URINALYSIS, MICROSCOPIC (REFLEX): RBC / HPF: NONE SEEN RBC/hpf (ref 0–5)

## 2018-11-10 LAB — ETHANOL: Alcohol, Ethyl (B): <10 mg/dL (ref <10)

## 2018-11-10 MED ORDER — SODIUM CHLORIDE 0.9 % IV BOLUS
1000.0000 mL | Freq: Once | INTRAVENOUS | Status: AC
  Administered 2018-11-10: 1000 mL via INTRAVENOUS

## 2018-11-10 MED ORDER — ONDANSETRON HCL 4 MG/2ML IJ SOLN
4.0000 mg | Freq: Once | INTRAMUSCULAR | Status: AC
  Administered 2018-11-10: 4 mg via INTRAVENOUS
  Filled 2018-11-10: qty 2

## 2018-11-10 MED ORDER — THIAMINE HCL 100 MG/ML IJ SOLN
100.0000 mg | Freq: Once | INTRAMUSCULAR | Status: AC
  Administered 2018-11-10: 100 mg via INTRAVENOUS
  Filled 2018-11-10: qty 2

## 2018-11-10 MED ORDER — ONDANSETRON 4 MG PO TBDP: 4.0000 mg | ORAL_TABLET | Freq: Three times a day (TID) | ORAL | 0 refills | Status: DC | PRN

## 2018-11-10 MED ORDER — CHLORDIAZEPOXIDE HCL 25 MG PO CAPS: ORAL_CAPSULE | ORAL | 0 refills | Status: DC

## 2018-11-10 MED ORDER — LORAZEPAM 2 MG/ML IJ SOLN
1.0000 mg | Freq: Once | INTRAMUSCULAR | Status: AC
  Administered 2018-11-10: 1 mg via INTRAVENOUS
  Filled 2018-11-10: qty 1

## 2018-11-10 NOTE — ED Notes (Signed)
Pt made aware that urine specimen is needed. Unable to provide at this time.

## 2018-11-10 NOTE — ED Notes (Signed)
ED Provider at bedside. 

## 2018-11-10 NOTE — ED Provider Notes (Signed)
Emergency Department Provider Note   I have reviewed the triage vital signs and the nursing notes.   HISTORY  Chief Complaint Emesis   HPI Meredith Park is a 48 y.o. female with PMH of HLD, HTN, and EtOH abuse presents to the emergency department for evaluation of nausea and vomiting in the setting of recent alcohol bender.  She drank last week from Monday through Friday.  She did not drink yesterday but since then has had persistent nausea and vomiting.  No seizures although patient does admit to history of alcohol-related seizures.  She is not on antiepileptic drugs.  She denies any fevers or chills.  She is not having chest pain, shortness of breath, paresthesias, or hallucinations.  The patient's friend at bedside describes some confusion but states she believes this because the patient does not remember being here last time.  Patient states the last time she was here she was intoxicated.  No other confusion symptoms noted by friend.   Past Medical History:  Diagnosis Date  . Alcohol abuse   . High cholesterol   . Hypertension     Patient Active Problem List   Diagnosis Date Noted  . Alcohol withdrawal (HCC) 08/28/2018    History reviewed. No pertinent surgical history.  Allergies Meperidine  Family History  Family history unknown: Yes    Social History Social History   Tobacco Use  . Smoking status: Never Smoker  . Smokeless tobacco: Never Used  Substance Use Topics  . Alcohol use: Yes    Comment: liquor daily  . Drug use: No    Review of Systems  Constitutional: No fever/chills Eyes: No visual changes. ENT: No sore throat. Cardiovascular: Denies chest pain. Respiratory: Denies shortness of breath. Gastrointestinal: No abdominal pain. Positive nausea and vomiting.  No diarrhea.  No constipation. Genitourinary: Negative for dysuria. Musculoskeletal: Negative for back pain. Skin: Negative for rash. Neurological: Negative for headaches, focal  weakness or numbness.  10-point ROS otherwise negative.  ____________________________________________   PHYSICAL EXAM:  VITAL SIGNS: ED Triage Vitals  Enc Vitals Group     BP 11/10/18 0941 (!) 147/99     Pulse Rate 11/10/18 0941 81     Resp 11/10/18 0941 20     Temp 11/10/18 0941 98.5 F (36.9 C)     Temp Source 11/10/18 0941 Oral     SpO2 11/10/18 0941 100 %     Weight 11/10/18 0942 220 lb 0.3 oz (99.8 kg)     Height 11/10/18 0942 5\' 2"  (1.575 m)   Constitutional: Alert and oriented. Well appearing and in no acute distress. Eyes: Conjunctivae are normal. Head: Atraumatic. Nose: No congestion/rhinnorhea. Mouth/Throat: Mucous membranes are moist.  Neck: No stridor.   Cardiovascular: Normal rate, regular rhythm. Good peripheral circulation. Grossly normal heart sounds.   Respiratory: Normal respiratory effort.  No retractions. Lungs CTAB. Gastrointestinal: Soft and nontender. No distention.  Musculoskeletal: No lower extremity tenderness nor edema. No gross deformities of extremities. Neurologic:  Normal speech and language. No gross focal neurologic deficits are appreciated.  Skin:  Skin is warm, dry and intact. No rash noted.  ____________________________________________   LABS (all labs ordered are listed, but only abnormal results are displayed)  Labs Reviewed  COMPREHENSIVE METABOLIC PANEL - Abnormal; Notable for the following components:      Result Value   Sodium 134 (*)    CO2 21 (*)    Glucose, Bld 143 (*)    Calcium 7.8 (*)    Total  Protein 6.4 (*)    AST 44 (*)    All other components within normal limits  CBC WITH DIFFERENTIAL/PLATELET - Abnormal; Notable for the following components:   Hemoglobin 11.3 (*)    HCT 33.9 (*)    Platelets 147 (*)    All other components within normal limits  URINALYSIS, ROUTINE W REFLEX MICROSCOPIC - Abnormal; Notable for the following components:   Ketones, ur 15 (*)    Leukocytes,Ua TRACE (*)    All other components  within normal limits  URINALYSIS, MICROSCOPIC (REFLEX) - Abnormal; Notable for the following components:   Bacteria, UA MANY (*)    All other components within normal limits  LIPASE, BLOOD  ETHANOL  ____________________________________________  RADIOLOGY  None ____________________________________________   PROCEDURES  Procedure(s) performed:   Procedures  None ____________________________________________   INITIAL IMPRESSION / ASSESSMENT AND PLAN / ED COURSE  Pertinent labs & imaging results that were available during my care of the patient were reviewed by me and considered in my medical decision making (see chart for details).  Patient presents to the emergency department for evaluation of nausea and vomiting the setting of recent alcohol binge.  No alcohol in the past 36 hours.  Patient is not confused.  Her main symptoms are nausea and vomiting.  No tenderness on exam of the patient's abdomen.  She is not tremulous or confused.  Very low suspicion for complicated alcohol withdrawal.  No seizures. CIWA 10 but mostly subjective symptoms. Vitals and overall appearance is unremarkable.   Labs negative. Patient tolerating PO in the ED. Plan for Librium taper and PCP. Gave list of local resources. Discussed ED return precautions.  ____________________________________________  FINAL CLINICAL IMPRESSION(S) / ED DIAGNOSES  Final diagnoses:  Non-intractable vomiting with nausea, unspecified vomiting type  Alcohol abuse     MEDICATIONS GIVEN DURING THIS VISIT:  Medications  sodium chloride 0.9 % bolus 1,000 mL (0 mLs Intravenous Stopped 11/10/18 1115)  ondansetron (ZOFRAN) injection 4 mg (4 mg Intravenous Given 11/10/18 1016)  LORazepam (ATIVAN) injection 1 mg (1 mg Intravenous Given 11/10/18 1017)  thiamine (B-1) injection 100 mg (100 mg Intravenous Given 11/10/18 1017)     NEW OUTPATIENT MEDICATIONS STARTED DURING THIS VISIT:  Discharge Medication List as of 11/10/2018 11:30 AM     START taking these medications   Details  chlordiazePOXIDE (LIBRIUM) 25 MG capsule 50mg  PO TID x 1D, then 25-50mg  PO BID X 1D, then 25-50mg  PO QD X 1D, Normal    ondansetron (ZOFRAN ODT) 4 MG disintegrating tablet Take 1 tablet (4 mg total) by mouth every 8 (eight) hours as needed., Starting Sun 11/10/2018, Normal        Note:  This document was prepared using Dragon voice recognition software and may include unintentional dictation errors.  Alona Bene, MD Emergency Medicine    Magdalyn Arenivas, Arlyss Repress, MD 11/10/18 2009

## 2018-11-10 NOTE — Discharge Instructions (Signed)
Substance Abuse Treatment Programs ° °Intensive Outpatient Programs °[facility]     °[address]      °[place], [place]                   °[phone]      ° °[facility] °[address] °[place], [place] °[phone] ° °[facility]     °(Inpatient and outpatient)     °[address]           °[phone]   ° °[facility] °[phone] (Suboxone and Methadone) ° °[address]      °[place], [place] [address]      °336-882-2125      ° °[address] °[place], [place] °852-3033 ° °[facility] (Outpatient/Inpatient, Chemical)    °(insurance only) [phone]      °       °[facility] (Groups & Residential) °[place], [place] °[phone] ° °   °[facility]     °[address]     °[place], [place]      °[phone]      ° °[facility] (for caregivers and family) °[address] °[place], [place] °336-299-4655 ° ° ° ° ° °Residential Treatment Programs °[facility]      °[address],  [place], [place] [address]  °([phone]      ° °[facility] °[address] [place], [place] [address] °[phone] ° °[facility]        °336-248-8914      ° °[facility] °1-800-659-3381 ° °[facility]             °[address]                                         °[place], [place]                                                °877-615-2722 or 336-784-9470                              ° °[facility] [place] °[address] °[place], [address] °1.877.941.8954 ° °[facility]    °[address]      °[place], [place]     °[phone]      ° °[facility] °[address] °[place], [place] °[phone] ° °[facility]   °[address]     °[place], [place] [address]     °[phone]      °Admissions: 8am-3pm M-F ° °[facility] °[address] °[place],  [place] °336-227-7417 ° °[facility]: Residential Program (90 Days)   °[place], [place]      °336-725-8389 or 800-758-6077    ° °[facility] °[place], [place] °(Walk in Hours over the weekend or by referral) ° °[facility] °[address], [place],  [address] °([phone] ° °Crisis Mobile: [facility]:  1-877-626-1772 (for crisis response 24 hours a day) °[facility] Hotline:      1-800-256-2452 °Outpatient Psychiatry and Counseling ° °[facility]: Mobile Crisis   Management 24 hours:  1-877-626-1772 ° °[facility] sliding scale fee and walk in schedule: M-F 8am-12pm/1pm-3pm °[address]  °[place], [place] [address] °[phone] ° °[facility] °[address] °[place], [place] [address] °336-703-9650 ° °[facility] ([facility])- new patient walk-in appointments available Monday - Friday 8am -3pm.          °[address] °[place], [place] [address] °336-676-6840 or crisis line- 336-676-6905 ° ° [facility] Intensive Outpatient Therapy Program °[address] °[place], [place] [address] °336-832-9804 ° °[facility]                  °Crisis Services      °[phone]      °[address]     °[place], [place] [address]                ° °[facility]   °[facility] °[phone] °[address] °[place], [place] [address] ° ° °[facility]          °[address],  °[place], [place] [address]       °([phone] ° °[facility] °[address] °[place], [place] [address] °336-292-1510 ° °[facility]    °[address]    °[place], [place] [address]     °[phone]      ° °Parish McKinney, MD     °[address]     °[place] [address]     °336-282-1251     °  °[facility] °[address] °[place] [address] ° °[facility]     °[address]     °[place], [place]      °336-542-2076      ° °[facility] °Shamsher Ahluwalia, MD °[address] °[place], [place] [address] °[phone] ° °[facility]     °[address]     °[place], [place] [address]     °336-274-1237      ° °[facility] °[address] °[place], [place] [address] °336-854-4450 °Resources for Temporary Residential Assistance/Crisis Centers ° °DAY CENTERS °[facility] °M-F 8am-3pm   °[address] [place], [place] [address]   336-332-0824 °Services include: laundry, barbering, support groups, case management, phone  & computer access, showers, AA/NA mtgs, mental health/substance abuse nurse, job skills class, disability information, VA assistance, spiritual classes, etc.  ° °HOMELESS SHELTERS ° °[facility]     °[facility]   °[address], [place]     °[phone]       °       °[facility] (women and children)       °[address] °[place], [place] [address] °336-275-0820 °[email or web address] for application and process °Application Required ° °[facility]   °[address]    °[place] [address]     °[phone]       °             °[facility] °[address] °[place], [place] [address] °[phone] °336-235-0363(schedule application appt.) °Application Required ° °[facility] (women only)    °[address]     °[place],  [address]     °336-884-1039      °  Intake starts 6pm daily °Need valid ID, SSC, & Police report °[facility] [place] °[address] °[place], [place] °336-881-5420 °Application Required ° °[facility] (men only)     °[address]      °[place], [place]     °[phone]      ° °[facility] °(Pregnant women only) °[address] °[place], [place] °336-275-0206 ° °[facility]      °[address]      °[place], [place] [address]     °[phone]      °       °[facility] [facility] °[address] °[place], [place] °336-723-1848 °90 day commitment/SA/Application process ° °[facility](men only)     °[address]     °[place], [place]     °[phone]       °Check-in at 7pm     °       °[facility] °[address] °[place], [place] [address] °336-248-6684 °Men/Women/Women and Children must be there by 7 pm ° °[facility] °[place],  °336-722-8721                ° °

## 2018-11-10 NOTE — ED Triage Notes (Signed)
Patient states that she recently went on a drinking binge after being ETOH free for a bit - the patient reports that she has had N/V since she stopped again on Friday and she doesn't feel right. 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

## 2021-07-12 ENCOUNTER — Emergency Department (HOSPITAL_BASED_OUTPATIENT_CLINIC_OR_DEPARTMENT_OTHER)
Admission: EM | Admit: 2021-07-12 | Discharge: 2021-07-12 | Disposition: A | Discharge status: Home / Self Care | Payer: BC Managed Care – PPO | Attending: Emergency Medicine | Admitting: Emergency Medicine

## 2021-07-12 ENCOUNTER — Other Ambulatory Visit: Payer: Self-pay

## 2021-07-12 ENCOUNTER — Emergency Department (HOSPITAL_BASED_OUTPATIENT_CLINIC_OR_DEPARTMENT_OTHER): Payer: BC Managed Care – PPO

## 2021-07-12 ENCOUNTER — Encounter (HOSPITAL_BASED_OUTPATIENT_CLINIC_OR_DEPARTMENT_OTHER): Payer: Self-pay

## 2021-07-12 DIAGNOSIS — I10 Essential (primary) hypertension: Secondary | ICD-10-CM | POA: Diagnosis not present

## 2021-07-12 DIAGNOSIS — R55 Syncope and collapse: Secondary | ICD-10-CM | POA: Insufficient documentation

## 2021-07-12 DIAGNOSIS — Z79899 Other long term (current) drug therapy: Secondary | ICD-10-CM | POA: Diagnosis not present

## 2021-07-12 DIAGNOSIS — R230 Cyanosis: Secondary | ICD-10-CM | POA: Insufficient documentation

## 2021-07-12 DIAGNOSIS — R4182 Altered mental status, unspecified: Secondary | ICD-10-CM | POA: Diagnosis not present

## 2021-07-12 DIAGNOSIS — R6813 Apparent life threatening event in infant (ALTE): Secondary | ICD-10-CM

## 2021-07-12 DIAGNOSIS — R079 Chest pain, unspecified: Secondary | ICD-10-CM

## 2021-07-12 DIAGNOSIS — R110 Nausea: Secondary | ICD-10-CM | POA: Diagnosis not present

## 2021-07-12 LAB — RAPID URINE DRUG SCREEN, HOSP PERFORMED
Amphetamines: NOT DETECTED
Barbiturates: NOT DETECTED
Benzodiazepines: NOT DETECTED
Cocaine: NOT DETECTED
Opiates: NOT DETECTED
Tetrahydrocannabinol: POSITIVE — AB

## 2021-07-12 LAB — COMPREHENSIVE METABOLIC PANEL
ALT: 37 U/L (ref 0–44)
AST: 30 U/L (ref 15–41)
Albumin: 4.2 g/dL (ref 3.5–5.0)
Alkaline Phosphatase: 96 U/L (ref 38–126)
Anion gap: 8 (ref 5–15)
BUN: 13 mg/dL (ref 6–20)
CO2: 24 mmol/L (ref 22–32)
Calcium: 9 mg/dL (ref 8.9–10.3)
Chloride: 107 mmol/L (ref 98–111)
Creatinine, Ser: 0.8 mg/dL (ref 0.44–1.00)
GFR, Estimated: >60 mL/min (ref >60)
Glucose, Bld: 129 mg/dL — ABNORMAL HIGH (ref 70–99)
Potassium: 4 mmol/L (ref 3.5–5.1)
Sodium: 139 mmol/L (ref 135–145)
Total Bilirubin: 0.3 mg/dL (ref 0.3–1.2)
Total Protein: 7.2 g/dL (ref 6.5–8.1)

## 2021-07-12 LAB — CBC WITH DIFFERENTIAL/PLATELET
Abs Immature Granulocytes: 0.02 10*3/uL (ref 0.00–0.07)
Basophils Absolute: 0 10*3/uL (ref 0.0–0.1)
Basophils Relative: 0 %
Eosinophils Absolute: 0.1 10*3/uL (ref 0.0–0.5)
Eosinophils Relative: 1 %
HCT: 39.9 % (ref 36.0–46.0)
Hemoglobin: 13.3 g/dL (ref 12.0–15.0)
Immature Granulocytes: 0 %
Lymphocytes Relative: 17 %
Lymphs Abs: 1.3 10*3/uL (ref 0.7–4.0)
MCH: 27.7 pg (ref 26.0–34.0)
MCHC: 33.3 g/dL (ref 30.0–36.0)
MCV: 83.1 fL (ref 80.0–100.0)
Monocytes Absolute: 0.4 10*3/uL (ref 0.1–1.0)
Monocytes Relative: 6 %
Neutro Abs: 5.7 10*3/uL (ref 1.7–7.7)
Neutrophils Relative %: 76 %
Platelets: 203 10*3/uL (ref 150–400)
RBC: 4.8 MIL/uL (ref 3.87–5.11)
RDW: 13.9 % (ref 11.5–15.5)
WBC: 7.5 10*3/uL (ref 4.0–10.5)
nRBC: 0 % (ref 0.0–0.2)

## 2021-07-12 LAB — URINALYSIS, ROUTINE W REFLEX MICROSCOPIC
Bilirubin Urine: NEGATIVE
Glucose, UA: NEGATIVE mg/dL
Hgb urine dipstick: NEGATIVE
Ketones, ur: NEGATIVE mg/dL
Leukocytes,Ua: NEGATIVE
Nitrite: NEGATIVE
Protein, ur: NEGATIVE mg/dL
Specific Gravity, Urine: >=1.03 (ref 1.005–1.030)
pH: 6.5 (ref 5.0–8.0)

## 2021-07-12 LAB — TROPONIN I (HIGH SENSITIVITY): Troponin I (High Sensitivity): 3 ng/L (ref <18)

## 2021-07-12 LAB — PREGNANCY, URINE: Preg Test, Ur: NEGATIVE

## 2021-07-12 LAB — AMMONIA: Ammonia: 30 umol/L (ref 9–35)

## 2021-07-12 NOTE — Discharge Instructions (Signed)
Follow up with your PCP for further evaluation of your symptoms  You can also follow up with [facility]. Please call to make an appointment.   Return to the ED for any new/worsening symptoms

## 2021-07-12 NOTE — ED Triage Notes (Signed)
Per daughter "pt with episodes where she zones out" x years-worse x 2 months after getting out of hosp r/t elevated ammonia levels-today she was driving and started c/o of feeling hot that she relates to menopause-pt NAD-A/O-denies pain

## 2021-07-12 NOTE — ED Provider Notes (Signed)
[facility] [place] EMERGENCY DEPARTMENT Provider Note   CSN: [id number] Arrival date & time: 07/12/21  1251     History Chief Complaint  Patient presents with   Near Syncope    Meredith Park is a 50 y.o. female with PMHx HTN, high cholesterol, and past alcohol abuse who presents to the ED today with daughter with concern related to episodes of patient "zoning out."  Daughter reports that patient was admitted to the hospital earlier this year with hepatic encephalopathy.  She states that since that time she feels like she has had episodes where she gets hot, flushed, nauseated and then zones out for a minute or 2.  She reports that today they were driving when this occurred and she had to tell patient to pull over onto the side of the road.  She is concerned that her ammonia levels could be elevated.  Patient states that she is no longer drinking.  Daughter does mention that patient had alcohol withdrawal seizure x 1 couple of years ago as well and she had similar symptoms of feeling hot, flushed, nauseated and then had tonic-clonic seizure.  She did not notice any tonic-clonic movement today.  Patient states that she remembers the entire encounter however daughter reports that when they pulled over on the to the side of the road patient asked her where they were going and seemed slightly confused.  Patient states that she feels like she is having hot flashes and going through menopause.  Her last normal menstrual period was last December. Pt does not take lactulose daily. She reports her doctors told her to stop taking it.   The history is provided by the patient, medical records and a relative.      Past Medical History:  Diagnosis Date   Alcohol abuse    High cholesterol    Hypertension     Patient Active Problem List   Diagnosis Date Noted   Alcohol withdrawal (HCC) 08/28/2018    History reviewed. No pertinent surgical history.   OB History   No obstetric history on  file.     Family History  Family history unknown: Yes    Social History   Tobacco Use   Smoking status: Never   Smokeless tobacco: Never  Vaping Use   Vaping Use: Some days  Substance Use Topics   Alcohol use: Not Currently    Comment: 12/11/2018 qui   Drug use: No    Home Medications Prior to Admission medications   Medication Sig Start Date End Date Taking? Authorizing Provider  carvedilol (COREG) 12.5 MG tablet Take 1 tablet (12.5 mg total) by mouth 2 (two) times daily. 08/24/17   Fayrene Helper, PA-C  chlordiazePOXIDE (LIBRIUM) 25 MG capsule 50mg  PO TID x 1D, then 25-50mg  PO BID X 1D, then 25-50mg  PO QD X 1D 11/10/18   Long, Arlyss Repress, MD  folic acid (FOLVITE) 1 MG tablet Take 1 tablet (1 mg total) by mouth daily. 08/30/18   Glade Lloyd, MD  LORazepam (ATIVAN) 1 MG tablet Take 1 tablet (1 mg total) by mouth 3 (three) times daily as needed for anxiety (tremor). 09/15/18   Doug Sou, MD  LORazepam (ATIVAN) 1 MG tablet Take 1 tablet (1 mg total) by mouth every 8 (eight) hours as needed for anxiety (Tremor). 09/15/18   Doug Sou, MD  losartan-hydrochlorothiazide (HYZAAR) 100-25 MG tablet Take 1 tablet by mouth daily. 03/28/17   [provider]  metoCLOPramide (REGLAN) 10 MG tablet Take 1 tablet (10  mg total) by mouth every 6 (six) hours as needed for nausea (nausea/headache). 09/15/18   Doug Sou, MD  metoCLOPramide (REGLAN) 10 MG tablet Take 1 tablet (10 mg total) by mouth every 6 (six) hours as needed for nausea (nausea/headache). 09/15/18   Doug Sou, MD  Multiple Vitamin (MULTIVITAMIN WITH MINERALS) TABS tablet Take 1 tablet by mouth daily. 08/30/18   Glade Lloyd, MD  ondansetron (ZOFRAN ODT) 4 MG disintegrating tablet Take 1 tablet (4 mg total) by mouth every 8 (eight) hours as needed. 11/10/18   Long, Arlyss Repress, MD  ondansetron (ZOFRAN) 4 MG tablet Take 1 tablet (4 mg total) by mouth every 6 (six) hours as needed for nausea. 08/30/18   Glade Lloyd, MD   oxymetazoline (AFRIN) 0.05 % nasal spray Place 1 spray into both nostrils 2 (two) times daily as needed for congestion.    [provider]  SUMAtriptan (IMITREX) 100 MG tablet Take 100 mg by mouth as needed for migraine.  03/30/17   [provider]  thiamine 100 MG tablet Take 1 tablet (100 mg total) by mouth daily. 08/30/18   Glade Lloyd, MD    Allergies    Meperidine  Review of Systems   Review of Systems  Constitutional:  Positive for diaphoresis. Negative for chills and fever.  Respiratory:  Negative for shortness of breath.   Cardiovascular:  Negative for chest pain.  Gastrointestinal:  Positive for nausea.  Neurological:  Negative for seizures, syncope, weakness, numbness and headaches.  Psychiatric/Behavioral:  Positive for confusion.   All other systems reviewed and are negative.  Physical Exam Updated Vital Signs BP (!) 143/87   Pulse 76   Temp 98.5 F (36.9 C) (Oral)   Resp (!) 22   Ht 5\' 3"  (1.6 m)   Wt 108 kg   LMP 08/28/2020   SpO2 98%   BMI 42.16 kg/m   Physical Exam Vitals and nursing note reviewed.  Constitutional:      Appearance: She is not ill-appearing or diaphoretic.  HENT:     Head: Normocephalic and atraumatic.  Eyes:     General: No scleral icterus.    Extraocular Movements: Extraocular movements intact.     Conjunctiva/sclera: Conjunctivae normal.     Pupils: Pupils are equal, round, and reactive to light.  Cardiovascular:     Rate and Rhythm: Normal rate and regular rhythm.     Pulses: Normal pulses.  Pulmonary:     Effort: Pulmonary effort is normal.     Breath sounds: Normal breath sounds. No wheezing, rhonchi or rales.  Abdominal:     Palpations: Abdomen is soft.     Tenderness: There is no abdominal tenderness. There is no guarding or rebound.  Musculoskeletal:     Cervical back: Neck supple.  Skin:    General: Skin is warm and dry.  Neurological:     Mental Status: She is alert.     Comments: Alert and  oriented to self, place, time and event.   Speech is fluent, clear without dysarthria or dysphasia.   Strength 5/5 in upper/lower extremities   Sensation intact in upper/lower extremities   Normal gait.  Negative Romberg. No pronator drift.  Normal finger-to-nose and feet tapping.  CN I not tested  CN II grossly intact visual fields bilaterally. Did not visualize posterior eye.  CN III, IV, VI PERRLA and EOMs intact bilaterally  CN V Intact sensation to sharp and light touch to the face  CN VII facial movements symmetric  CN VIII not tested  CN IX, X no uvula deviation, symmetric rise of soft palate  CN XI 5/5 SCM and trapezius strength bilaterally  CN XII Midline tongue protrusion, symmetric L/R movements      ED Results / Procedures / Treatments   Labs (all labs ordered are listed, but only abnormal results are displayed) Labs Reviewed  COMPREHENSIVE METABOLIC PANEL - Abnormal; Notable for the following components:      Result Value   Glucose, Bld 129 (*)    All other components within normal limits  URINALYSIS, ROUTINE W REFLEX MICROSCOPIC - Abnormal; Notable for the following components:   APPearance HAZY (*)    All other components within normal limits  RAPID URINE DRUG SCREEN, HOSP PERFORMED - Abnormal; Notable for the following components:   Tetrahydrocannabinol POSITIVE (*)    All other components within normal limits  CBC WITH DIFFERENTIAL/PLATELET  AMMONIA  PREGNANCY, URINE  TROPONIN I (HIGH SENSITIVITY)    EKG EKG Interpretation  Date/Time:  Tuesday July 12 2021 14:51:02 EST Ventricular Rate:  62 PR Interval:  103 QRS Duration: 94 QT Interval:  395 QTC Calculation: 402 R Axis:   56 Text Interpretation: Sinus rhythm Atrial premature complex Short PR interval No significant change since last tracing Confirmed by Ernie Avena ([id number]) on 07/12/2021 3:30:16 PM  Radiology DG Chest 2 View  Result Date: 07/12/2021 CLINICAL DATA:  Altered EXAM: CHEST - 2  VIEW COMPARISON:  08/29/2012 FINDINGS: The heart size and mediastinal contours are within normal limits. No focal airspace consolidation, pleural effusion, or pneumothorax. The visualized skeletal structures are unremarkable. IMPRESSION: No active cardiopulmonary disease. Electronically Signed   By: Duanne Guess D.O.   On: 07/12/2021 14:40   CT Head Wo Contrast  Result Date: 07/12/2021 CLINICAL DATA:  Altered mental status. EXAM: CT HEAD WITHOUT CONTRAST TECHNIQUE: Contiguous axial images were obtained from the base of the skull through the vertex without intravenous contrast. COMPARISON:  CT head dated March 03, 2021. FINDINGS: Brain: No evidence of acute infarction, hemorrhage, hydrocephalus, extra-axial collection or mass lesion/mass effect. Stable mild atrophy. Vascular: Calcified atherosclerosis at the skull base. No hyperdense vessel. Skull: Normal. Negative for fracture or focal lesion. Sinuses/Orbits: No acute finding. Other: None. IMPRESSION: 1. No acute intracranial abnormality. Electronically Signed   By: Obie Dredge M.D.   On: 07/12/2021 14:39    Procedures Procedures   Medications Ordered in ED Medications - No data to display  ED Course  I have reviewed the triage vital signs and the nursing notes.  Pertinent labs & imaging results that were available during my care of the patient were reviewed by me and considered in my medical decision making (see chart for details).    MDM Rules/Calculators/A&P                           50 year old female who presents to the ED today with complaint of altered level of consciousness/nausea/flushness that occurred earlier today.  Daughter concerned that patient's ammonia level could be elevated.  History of same with admission to [facility] this summer for hepatic encephalopathy.  Patient no longer drinking.  On arrival to the ED today vitals are stable.  Blood pressure is slightly elevated at 167/96.  Patient appears to be in no  acute distress.  She is alert and oriented x4.  She has no focal neurodeficits on exam today.  Denies any chest pain, shortness of breath, headache or prodrome prior  to feeling nauseated today.  She does mention she feels like she may be starting menopause as she has not had a menstrual cycle since December 2021.  We will plan to work-up for brief altered level of consciousness earlier today.  If work-up negative we will plan to discharge home with PCP follow-up.  EKG without acute ischemic changes  CBC without leukocytosis. Hgb stable at 13.1.  CMP with glucose 129. No other electrolyte abnormalities.  Ammonia WNL 30 Troponin 3 UPT negative U/A without signs of infection UDS positive for THC; pt does admit to delta 8 cookies on occasion  CT Head negative CXR negative  Workup overall reassuring at this time. Will discharge pt home. Her daughter is quite worried she could be having seizures - question absence seizure? However pt recalls entire event today and was able to pull her car off to the side of the road so this seems less likely. Will provide neurology follow up for further eval for daughter's ease of mind. Have recommended pt follow up with PCP for further eval as well; question hot flashes as cause of symptoms too. Pt and daughter are in agreement with plan and pt is stable for discharge home.   This note was prepared using Dragon voice recognition software and may include unintentional dictation errors due to the inherent limitations of voice recognition software.  Final Clinical Impression(s) / ED Diagnoses Final diagnoses:  Brief resolved unexplained event (BRUE)  Nausea    Rx / DC Orders ED Discharge Orders     None        Discharge Instructions      Follow up with your PCP for further evaluation of your symptoms  You can also follow up with [facility]. Please call to make an appointment.   Return to the ED for any new/worsening  symptoms        Tanda Rockers, Cordelia Poche 07/12/21 1622    Ernie Avena, MD 07/12/21 1640

## 2021-08-17 ENCOUNTER — Ambulatory Visit: Payer: BC Managed Care – PPO | Admitting: Neurology

## 2021-08-17 ENCOUNTER — Encounter: Payer: Self-pay | Admitting: Neurology

## 2021-08-17 VITALS — BP 139/91 | HR 80 | Ht 63.0 in | Wt 231.0 lb

## 2021-08-17 DIAGNOSIS — R0683 Snoring: Secondary | ICD-10-CM | POA: Diagnosis not present

## 2021-08-17 DIAGNOSIS — R5383 Other fatigue: Secondary | ICD-10-CM

## 2021-08-17 DIAGNOSIS — R569 Unspecified convulsions: Secondary | ICD-10-CM
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
## 2021-08-17 NOTE — Patient Instructions (Addendum)
Routine EEG  Continue your other mediations  Will contact your after completion of tests  Referral to sleep neurology

## 2021-08-23 ENCOUNTER — Ambulatory Visit (INDEPENDENT_AMBULATORY_CARE_PROVIDER_SITE_OTHER): Payer: BC Managed Care – PPO | Admitting: Neurology

## 2021-08-23 ENCOUNTER — Other Ambulatory Visit: Payer: Self-pay

## 2021-08-23 DIAGNOSIS — R569 Unspecified convulsions: Secondary | ICD-10-CM | POA: Diagnosis not present

## 2021-08-30 ENCOUNTER — Telehealth: Payer: Self-pay | Admitting: Neurology

## 2021-08-30 NOTE — Telephone Encounter (Signed)
Pt called asking about the results to her EEG. Pt requesting a call back.

## 2021-08-31 NOTE — Telephone Encounter (Signed)
Pt called again wanting to know when she will get a call back with the results of her EEG test.

## 2021-09-01 ENCOUNTER — Telehealth: Payer: Self-pay | Admitting: Neurology

## 2021-09-01 ENCOUNTER — Other Ambulatory Visit: Payer: Self-pay | Admitting: Neurology

## 2021-09-01 DIAGNOSIS — R569 Unspecified convulsions: Secondary | ICD-10-CM

## 2021-09-01 MED ORDER — LEVETIRACETAM 500 MG PO TABS: 500.0000 mg | ORAL_TABLET | Freq: Two times a day (BID) | ORAL | 4 refills | Status: DC

## 2021-09-01 NOTE — Telephone Encounter (Signed)
Spoke with patient, eeg is abnormal, will start her on Keppra and obtain a MRI brain. Can you please schedule her for follow up in 3 months. Thanks

## 2021-09-01 NOTE — Telephone Encounter (Signed)
Spoke with patient, go over EEG results showing right temporal region sharps and focal slowing. I will order a Brain MRI with and without contrast and start her on Keppra 500 mg BID.  Patient understand and she is comfortable with plans.    Windell Norfolk, MD

## 2021-09-01 NOTE — Telephone Encounter (Signed)
Pt has called again asking to be called with the EEG results as soon as they are available.

## 2021-09-01 NOTE — Telephone Encounter (Signed)
Appt made

## 2021-09-01 NOTE — Procedures (Signed)
° ° °  History:  71 yof with here for seizure  EEG classification: Awake and drowsy  Description of the recording: The background rhythms of this recording consists of a fairly well modulated medium amplitude alpha rhythm of 9-10 Hz that is reactive to eye opening and closure. As the record progresses, the patient appears to remain in the waking state throughout the recording. Photic stimulation was performed, did not show any abnormalities. Hyperventilation was also performed, did not show any abnormalities. Toward the end of the recording, the patient enters the drowsy state with slight symmetric slowing seen. The patient never enters stage II sleep. There was presence of occasional right temporal sharp discharges. There was intermittent right temporal focal slowing. EKG monitor shows no evidence of cardiac rhythm abnormalities with a heart rate of 66.  Impression: This is an abnormal EEG recording in the waking and drowsy state due to:  Right temporal sharps  Intermittent right temporal slowing    Right temporal sharps and intermittent right temporal focal slowing are consistent with an area of increase epileptogenic potential in the right temporal region.    Windell Norfolk, MD [facility]

## 2021-09-06 ENCOUNTER — Other Ambulatory Visit: Payer: Self-pay | Admitting: Neurology

## 2021-09-06 ENCOUNTER — Telehealth: Payer: Self-pay | Admitting: Neurology

## 2021-09-06 MED ORDER — LACOSAMIDE 100 MG PO TABS: 100.0000 mg | ORAL_TABLET | Freq: Two times a day (BID) | ORAL | 6 refills | Status: DC

## 2021-09-06 NOTE — Progress Notes (Signed)
Patient has mood side effects from Keppra, will switch her to Lacosamide.

## 2021-09-06 NOTE — Telephone Encounter (Signed)
Please call and inform patient to stop Keppra and start Vimpat 100 mg BID. It is a low dose Keppra, we do not need to taper it.   Windell Norfolk, MD

## 2021-09-06 NOTE — Telephone Encounter (Signed)
Pt called stating that ever since she started the levETIRAcetam (KEPPRA) 500 MG tablet she has been extremely mad at everything and then she starts crying out of no where and would like to discuss with RN. Please advise.

## 2021-09-06 NOTE — Telephone Encounter (Signed)
I called patient. I advised her to stop keppra and start vimpat 100mg  BID. This has been sent to [facility] in [facility]. Pt verbalized understanding. Patient will let [facility] know if she has seizure activity or side effects on Vimpat.

## 2021-09-13 ENCOUNTER — Telehealth: Payer: Self-pay | Admitting: Neurology

## 2021-09-13 NOTE — Telephone Encounter (Signed)
MR Brain w/wo contrast Dr. Sena Slate Berkley Harvey: [id number] (exp. 09/13/21 to 10/12/21). Patient is scheduled at [facility] for 09/14/21.

## 2021-09-14 ENCOUNTER — Ambulatory Visit: Payer: BC Managed Care – PPO

## 2021-09-14 DIAGNOSIS — R569 Unspecified convulsions: Secondary | ICD-10-CM | POA: Diagnosis not present

## 2021-09-14 MED ORDER — GADOBENATE DIMEGLUMINE 529 MG/ML IV SOLN
20.0000 mL | Freq: Once | INTRAVENOUS | Status: AC | PRN
  Administered 2021-09-14: 20 mL via INTRAVENOUS

## 2021-09-19 ENCOUNTER — Telehealth: Payer: Self-pay | Admitting: Neurology

## 2021-09-19 NOTE — Telephone Encounter (Signed)
Spoke with patient, discussed MRI results, all questions answered. Thanks

## 2021-09-19 NOTE — Telephone Encounter (Signed)
Pt has seen on my chart that the MRI results are back.  Pt saw mention of  a cyst and would like a call as soon as possible with an explanation of the MRI results.

## 2021-09-29 ENCOUNTER — Encounter (HOSPITAL_BASED_OUTPATIENT_CLINIC_OR_DEPARTMENT_OTHER): Payer: Self-pay

## 2021-09-29 ENCOUNTER — Other Ambulatory Visit: Payer: Self-pay

## 2021-09-29 ENCOUNTER — Inpatient Hospital Stay (HOSPITAL_BASED_OUTPATIENT_CLINIC_OR_DEPARTMENT_OTHER)
Admission: EM | Admit: 2021-09-29 | Discharge: 2021-10-03 | DRG: 101 | Disposition: A | Discharge status: Home / Self Care | Payer: BC Managed Care – PPO | Attending: Internal Medicine | Admitting: Internal Medicine

## 2021-09-29 ENCOUNTER — Emergency Department (HOSPITAL_BASED_OUTPATIENT_CLINIC_OR_DEPARTMENT_OTHER): Payer: BC Managed Care – PPO

## 2021-09-29 DIAGNOSIS — F1210 Cannabis abuse, uncomplicated: Secondary | ICD-10-CM | POA: Diagnosis not present

## 2021-09-29 DIAGNOSIS — F1021 Alcohol dependence, in remission: Secondary | ICD-10-CM | POA: Diagnosis not present

## 2021-09-29 DIAGNOSIS — Z7151 Drug abuse counseling and surveillance of drug abuser: Secondary | ICD-10-CM

## 2021-09-29 DIAGNOSIS — Z20822 Contact with and (suspected) exposure to covid-19: Secondary | ICD-10-CM | POA: Diagnosis present

## 2021-09-29 DIAGNOSIS — R413 Other amnesia: Secondary | ICD-10-CM | POA: Diagnosis not present

## 2021-09-29 DIAGNOSIS — G40909 Epilepsy, unspecified, not intractable, without status epilepticus: Secondary | ICD-10-CM | POA: Diagnosis not present

## 2021-09-29 DIAGNOSIS — I10 Essential (primary) hypertension: Secondary | ICD-10-CM | POA: Diagnosis not present

## 2021-09-29 DIAGNOSIS — Z79899 Other long term (current) drug therapy: Secondary | ICD-10-CM

## 2021-09-29 DIAGNOSIS — Z888 Allergy status to other drugs, medicaments and biological substances status: Secondary | ICD-10-CM | POA: Diagnosis not present

## 2021-09-29 DIAGNOSIS — G40919 Epilepsy, unspecified, intractable, without status epilepticus: Secondary | ICD-10-CM

## 2021-09-29 DIAGNOSIS — R4182 Altered mental status, unspecified: Secondary | ICD-10-CM

## 2021-09-29 DIAGNOSIS — F4323 Adjustment disorder with mixed anxiety and depressed mood: Secondary | ICD-10-CM

## 2021-09-29 DIAGNOSIS — R569 Unspecified convulsions: Secondary | ICD-10-CM | POA: Diagnosis present

## 2021-09-29 DIAGNOSIS — E876 Hypokalemia: Secondary | ICD-10-CM | POA: Diagnosis present

## 2021-09-29 DIAGNOSIS — Z6841 Body Mass Index (BMI) 40.0 and over, adult: Secondary | ICD-10-CM | POA: Diagnosis not present

## 2021-09-29 DIAGNOSIS — E7800 Pure hypercholesterolemia, unspecified: Secondary | ICD-10-CM | POA: Diagnosis not present

## 2021-09-29 HISTORY — DX: Unspecified convulsions: R56.9

## 2021-09-29 LAB — COMPREHENSIVE METABOLIC PANEL
ALT: 46 U/L — ABNORMAL HIGH (ref 0–44)
AST: 33 U/L (ref 15–41)
Albumin: 4.1 g/dL (ref 3.5–5.0)
Alkaline Phosphatase: 92 U/L (ref 38–126)
Anion gap: 8 (ref 5–15)
BUN: 13 mg/dL (ref 6–20)
CO2: 23 mmol/L (ref 22–32)
Calcium: 8.9 mg/dL (ref 8.9–10.3)
Chloride: 107 mmol/L (ref 98–111)
Creatinine, Ser: 0.79 mg/dL (ref 0.44–1.00)
GFR, Estimated: >60 mL/min (ref >60)
Glucose, Bld: 121 mg/dL — ABNORMAL HIGH (ref 70–99)
Potassium: 4.2 mmol/L (ref 3.5–5.1)
Sodium: 138 mmol/L (ref 135–145)
Total Bilirubin: 0.7 mg/dL (ref 0.3–1.2)
Total Protein: 6.9 g/dL (ref 6.5–8.1)

## 2021-09-29 LAB — RAPID URINE DRUG SCREEN, HOSP PERFORMED
Amphetamines: NOT DETECTED
Barbiturates: NOT DETECTED
Benzodiazepines: NOT DETECTED
Cocaine: NOT DETECTED
Opiates: NOT DETECTED
Tetrahydrocannabinol: POSITIVE — AB

## 2021-09-29 LAB — ETHANOL: Alcohol, Ethyl (B): <10 mg/dL (ref <10)

## 2021-09-29 LAB — RESP PANEL BY RT-PCR (FLU A&B, COVID) ARPGX2
Influenza A by PCR: NEGATIVE
Influenza B by PCR: NEGATIVE
SARS Coronavirus 2 by RT PCR: NEGATIVE

## 2021-09-29 LAB — CBC
HCT: 37.7 % (ref 36.0–46.0)
Hemoglobin: 12.9 g/dL (ref 12.0–15.0)
MCH: 27.9 pg (ref 26.0–34.0)
MCHC: 34.2 g/dL (ref 30.0–36.0)
MCV: 81.6 fL (ref 80.0–100.0)
Platelets: 199 10*3/uL (ref 150–400)
RBC: 4.62 MIL/uL (ref 3.87–5.11)
RDW: 14 % (ref 11.5–15.5)
WBC: 7 10*3/uL (ref 4.0–10.5)
nRBC: 0 % (ref 0.0–0.2)

## 2021-09-29 LAB — AMMONIA: Ammonia: 22 umol/L (ref 9–35)

## 2021-09-29 LAB — MAGNESIUM: Magnesium: 1.8 mg/dL (ref 1.7–2.4)

## 2021-09-29 MED ORDER — IBUPROFEN 400 MG PO TABS
800.0000 mg | ORAL_TABLET | Freq: Three times a day (TID) | ORAL | Status: DC | PRN
  Administered 2021-09-29 – 2021-10-03 (×2): 800 mg via ORAL
  Filled 2021-09-29 (×2): qty 2

## 2021-09-29 MED ORDER — ACETAMINOPHEN 650 MG RE SUPP: 650.0000 mg | Freq: Four times a day (QID) | RECTAL | Status: DC | PRN

## 2021-09-29 MED ORDER — ACETAMINOPHEN 325 MG PO TABS
650.0000 mg | ORAL_TABLET | Freq: Once | ORAL | Status: AC
  Administered 2021-09-29: 650 mg via ORAL
  Filled 2021-09-29: qty 2

## 2021-09-29 MED ORDER — ACETAMINOPHEN 325 MG PO TABS: 650.0000 mg | ORAL_TABLET | Freq: Four times a day (QID) | ORAL | Status: DC | PRN

## 2021-09-29 MED ORDER — LACOSAMIDE 50 MG PO TABS
100.0000 mg | ORAL_TABLET | Freq: Two times a day (BID) | ORAL | Status: DC
  Administered 2021-09-29 – 2021-09-30 (×2): 100 mg via ORAL
  Filled 2021-09-29 (×2): qty 2

## 2021-09-29 MED ORDER — ENOXAPARIN SODIUM 40 MG/0.4ML IJ SOSY
40.0000 mg | PREFILLED_SYRINGE | INTRAMUSCULAR | Status: DC
  Filled 2021-09-29: qty 0.4

## 2021-09-29 MED ORDER — CARVEDILOL 12.5 MG PO TABS: 12.5000 mg | ORAL_TABLET | Freq: Two times a day (BID) | ORAL | Status: DC

## 2021-09-29 MED ORDER — SODIUM CHLORIDE 0.9% FLUSH
3.0000 mL | Freq: Two times a day (BID) | INTRAVENOUS | Status: DC
  Administered 2021-09-29 – 2021-10-03 (×8): 3 mL via INTRAVENOUS

## 2021-09-29 MED ORDER — LOSARTAN POTASSIUM 50 MG PO TABS
50.0000 mg | ORAL_TABLET | Freq: Every day | ORAL | Status: DC
  Administered 2021-09-30 – 2021-10-02 (×3): 50 mg via ORAL
  Filled 2021-09-29 (×4): qty 1

## 2021-09-29 MED ORDER — ENOXAPARIN SODIUM 60 MG/0.6ML IJ SOSY
50.0000 mg | PREFILLED_SYRINGE | INTRAMUSCULAR | Status: DC
  Administered 2021-09-30 – 2021-10-02 (×3): 50 mg via SUBCUTANEOUS
  Filled 2021-09-29 (×3): qty 0.6

## 2021-09-29 MED ORDER — CARVEDILOL 12.5 MG PO TABS
12.5000 mg | ORAL_TABLET | Freq: Two times a day (BID) | ORAL | Status: DC
  Administered 2021-09-29 – 2021-10-03 (×8): 12.5 mg via ORAL
  Filled 2021-09-29 (×8): qty 1

## 2021-09-29 MED ORDER — POLYETHYLENE GLYCOL 3350 17 G PO PACK: 17.0000 g | PACK | Freq: Every day | ORAL | Status: DC | PRN

## 2021-09-29 NOTE — ED Triage Notes (Addendum)
Pt states "I feel out of it-sick-nauseous"-states sx started ~5am-states she feels may be r/t to not taking seizure med x 1 dose yesterday-states she was dx with "epilepsy" ~2 weeks ago-pt A/O-answering all ?s appropriately-mother with pt-NAD-steady gait

## 2021-09-29 NOTE — H&P (Signed)
History and Physical   Meredith Park [id number] DOB: 12/28/1970 DOA: 09/29/2021  PCP: Burnis MedinFulbright, Virginia E, PA-C   Patient coming from: Home  Chief Complaint: Seizure-like episode, confusion  HPI: Meredith Bridgemanngela Faith Oleson is a 51 y.o. female with medical history significant of seizures, alcohol use, hyperlipidemia, hypertension, adjustment disorder stating after possible seizure-like episode at home.  Patient had a episode where where she was at her therapist office when she was witnessed staring off into space.  No tonic-clonic activity that she has had a history of this in the past.  Afterwards she was feeling strange and was confused and forgetful.  Her family's not present on my exam but per chart review they corroborated that she had been confused and forgetful.  She states that while she did not have tonic-clonic activity she did have her typical preceding symptoms of feeling warm and having a funny taste in her mouth.  Patient went to the ED for further evaluation.  While at the ED patient reportedly had another episode without significant tonic-clonic seizure activity but followed by an episode of confusion once again.  Patient did miss her p.m. dose of Vimpat yesterday.  She does use THC edible in the evenings. She denies fevers, chills, chest pain, shortness of breath, abdominal pain, constipation, diarrhea, nausea, vomiting.  ED Course: Vital signs in ED stable.  Lab work-up showed CMP with glucose 121 and ALT 46.  CBC within normal limits.  Respiratory flu COVID-negative.  Vimpat level pending, ammonia level normal, ethanol negative, UDS positive for THC only.  Magnesium level normal.  CT head without acute abnormality.  Patient received Tylenol in the ED.  Neurology was consulted and [facility] for evaluation for breakthrough seizure versus pseudoseizure.  Review of Systems: As per HPI otherwise all other systems reviewed and are negative.  Past Medical  History:  Diagnosis Date   Alcohol abuse    Alcohol withdrawal (HCC) 08/28/2018   High cholesterol    Hypertension    Seizures (HCC)     History reviewed. No pertinent surgical history.  Social History  reports that she has never smoked. She has never used smokeless tobacco. She reports that she does not currently use alcohol. She reports current drug use. Drug: Marijuana.  Allergies  Allergen Reactions   Losartan Potassium-Hctz     "Out of mind"/high ammonia level   Meperidine Itching    Family History  Family history unknown: Yes  Reviewed on admission  Prior to Admission medications   Medication Sig Start Date End Date Taking? Authorizing Provider  carvedilol (COREG) 12.5 MG tablet Take 1 tablet (12.5 mg total) by mouth 2 (two) times daily. 08/24/17   Fayrene Helperran, Bowie, PA-C  folic acid (FOLVITE) 1 MG tablet Take 1 tablet (1 mg total) by mouth daily. 08/30/18   Glade LloydAlekh, Kshitiz, MD  Lacosamide 100 MG TABS Take 1 tablet (100 mg total) by mouth in the morning and at bedtime. 09/06/21 10/06/21  Windell Norfolkamara, Amadou, MD  losartan (COZAAR) 50 MG tablet Take 50 mg by mouth daily. 07/12/21   [provider]  Multiple Vitamin (MULTIVITAMIN WITH MINERALS) TABS tablet Take 1 tablet by mouth daily. 08/30/18   Glade LloydAlekh, Kshitiz, MD  oxymetazoline (AFRIN) 0.05 % nasal spray Place 1 spray into both nostrils 2 (two) times daily as needed for congestion.    [provider]  SUMAtriptan (IMITREX) 100 MG tablet Take 100 mg by mouth as needed for migraine.  03/30/17   [provider]  Physical Exam: Vitals:   09/29/21 1600 09/29/21 1630 09/29/21 1730 09/29/21 1800  BP: 123/82 112/79 123/73 116/80  Pulse: 73 66 62 71  Resp: 17 (!) 25 16   Temp:      TempSrc:      SpO2: 97% 98% 98% 96%  Weight:      Height:       Physical Exam Constitutional:      General: She is not in acute distress.    Appearance: Normal appearance.  HENT:     Head: Normocephalic and atraumatic.      Mouth/Throat:     Mouth: Mucous membranes are moist.     Pharynx: Oropharynx is clear.  Eyes:     Extraocular Movements: Extraocular movements intact.     Pupils: Pupils are equal, round, and reactive to light.  Cardiovascular:     Rate and Rhythm: Normal rate and regular rhythm.     Pulses: Normal pulses.     Heart sounds: Normal heart sounds.  Pulmonary:     Effort: Pulmonary effort is normal. No respiratory distress.     Breath sounds: Normal breath sounds.  Abdominal:     General: Bowel sounds are normal. There is no distension.     Palpations: Abdomen is soft.     Tenderness: There is no abdominal tenderness.  Musculoskeletal:        General: No swelling or deformity.  Skin:    General: Skin is warm and dry.  Neurological:     Comments: Mental Status: Patient is awake, alert, oriented x3 No signs of aphasia or neglect Cranial Nerves: II: Pupils equal, round, and reactive to light.   III,IV, VI: EOMI without ptosis or diploplia.  V: Facial sensation is symmetric to light touch. VII: Facial movement is symmetric.  VIII: hearing is intact to voice X: Uvula elevates symmetrically XI: Shoulder shrug is symmetric. XII: tongue is midline without atrophy or fasciculations.  Motor: Good effort thorughout, at Least 5/5 bilateral UE, 5/5 bilateral lower extremitiy  Sensory: Sensation is grossly intact bilateral UEs & LEs   Labs on Admission: I have personally reviewed following labs and imaging studies  CBC: Recent Labs  Lab 09/29/21 1152  WBC 7.0  HGB 12.9  HCT 37.7  MCV 81.6  PLT 199    Basic Metabolic Panel: Recent Labs  Lab 09/29/21 1152  NA 138  K 4.2  CL 107  CO2 23  GLUCOSE 121*  BUN 13  CREATININE 0.79  CALCIUM 8.9  MG 1.8    GFR: Estimated Creatinine Clearance: 98 mL/min (by C-G formula based on SCr of 0.79 mg/dL).  Liver Function Tests: Recent Labs  Lab 09/29/21 1152  AST 33  ALT 46*  ALKPHOS 92  BILITOT 0.7  PROT 6.9  ALBUMIN 4.1     Urine analysis:    Component Value Date/Time   COLORURINE YELLOW 07/12/2021 1542   APPEARANCEUR HAZY (A) 07/12/2021 1542   LABSPEC >=1.030 07/12/2021 1542   PHURINE 6.5 07/12/2021 1542   GLUCOSEU NEGATIVE 07/12/2021 1542   HGBUR NEGATIVE 07/12/2021 1542   BILIRUBINUR NEGATIVE 07/12/2021 1542   KETONESUR NEGATIVE 07/12/2021 1542   PROTEINUR NEGATIVE 07/12/2021 1542   NITRITE NEGATIVE 07/12/2021 1542   LEUKOCYTESUR NEGATIVE 07/12/2021 1542    Radiological Exams on Admission: CT Head Wo Contrast  Result Date: 09/29/2021 CLINICAL DATA:  Altered mental status, nontraumatic. Headache. Patient feels "out of it." EXAM: CT HEAD WITHOUT CONTRAST TECHNIQUE: Contiguous axial images were obtained from the base of the  skull through the vertex without intravenous contrast. RADIATION DOSE REDUCTION: This exam was performed according to the departmental dose-optimization program which includes automated exposure control, adjustment of the mA and/or kV according to patient size and/or use of iterative reconstruction technique. COMPARISON:  CT brain 08/05/2021 FINDINGS: Brain: The ventricles are normal in size and configuration. The basilar cisterns are patent. No mass, mass effect, or midline shift. No acute intracranial hemorrhage is seen. No abnormal extra-axial fluid collection. Preservation of the normal cortical gray-white interface without CT evidence of an acute major vascular territorial cortical based infarction. Vascular: No hyperdense vessel or unexpected calcification. Skull: Normal. Negative for fracture or focal lesion. Sinuses/Orbits: The visualized orbits are unremarkable. The visualized paranasal sinuses and mastoid air cells are clear. Other: None. Electronically Signed   By: Neita Garnet M.D.   On: 09/29/2021 14:16    EKG: Not performed in the ED.  Assessment/Plan Principal Problem:   Seizure (HCC) Active Problems:   Alcohol dependence in remission (HCC)   Adjustment disorder with  mixed anxiety and depressed mood   Essential (primary) hypertension  Seizure disorder ?Breakthrough seizure > Patient presenting after episode of confusion and forgetfulness.  Possibly postictal, no witnessed typical seizure activity.  Did have a second episode in the ED. > Recently diagnosed with epilepsy, and is on Vimpat but missed her dose yesterday evening.  Also uses THC which can lower seizure threshold. > Neurology consulted in the ED recommended admission was going for work-up for breakthrough seizure versus pseudoseizure. > No significant abnormalities on lab work-up in the ED other than UDS positive for THC.  Vimpat level pending.  CT head without acute Gershon Mussel. > Reports previous adverse reactions to Keppra where she was very angry all the time. - Appreciate neurology recommendations, will notify them of the patient has arrived - Continue with home Vimpat - Seizure precautions - Neurochecks - We will leave EEG (standard versus monitored) up to neurology  Hypertension - Continue home carvedilol and losartan  Adjustment disorder > Also therapist, not currently on any medication.  History of alcohol use > Not currently using.  DVT prophylaxis: Lovenox Code Status:   Full Family Communication:  None on admission.  Her mother has been with her at the med center and she is in contact with her.  She states her mother is up-to-date. Disposition Plan:   Patient is from:  Home  Anticipated DC to:  Home  Anticipated DC date:  1 to 2 days  Anticipated DC barriers: None  Consults called:  Neurology, consulted in the ED, will notify on arrival  Admission status:  Observation, telemetry   Severity of Illness: The appropriate patient status for this patient is OBSERVATION. Observation status is judged to be reasonable and necessary in order to provide the required intensity of service to ensure the patient's safety. The patient's presenting symptoms, physical exam findings, and  initial radiographic and laboratory data in the context of their medical condition is felt to place them at decreased risk for further clinical deterioration. Furthermore, it is anticipated that the patient will be medically stable for discharge from the hospital within 2 midnights of admission.    Synetta Fail MD [facility]  How to contact the [facility] Attending or Consulting provider 7A - 7P or covering provider during after hours 7P -7A, for this patient?   Check the care team in [facility] and look for a) attending/consulting [facility] provider listed and b) the [facility] team listed Log into [email or web address] and use  [place]'s universal password to access. If you do not have the password, please contact the hospital operator. Locate the [facility] provider you are looking for under [facility] and page to a number that you can be directly reached. If you still have difficulty reaching the provider, please page the [facility] (Director on Call) for the Hospitalists listed on amion for assistance.  09/29/2021, 7:50 PM

## 2021-09-29 NOTE — Consult Note (Signed)
Neurology Consultation Reason for Consult: Seizures Referring Physician: Trilby Drummer, a  CC: Seizures  History is obtained from: Patient  HPI: Meredith Park is a 51 y.o. female with a history of seizures that started towards the end of last year.  She describes a single episode of loss of consciousness with behavioral arrest and unresponsiveness.  She also describes that she has been told that she has staring spells.  Though she does not feel she has had many recently, she does not know if she has them and she lives alone.  She had an EEG done in work-up of these which revealed right temporal sharp waves.  She forgot her lacosamide last night.  Today, she has had multiple episodes of having a funny taste in her mouth and feeling warm, which proceeded in episode of feeling "foggy headed.     ROS: A 14 point ROS was performed and is negative except as noted in the HPI.  Past Medical History:  Diagnosis Date   Alcohol abuse    Alcohol withdrawal ([place]) 08/28/2018   High cholesterol    Hypertension    Seizures ([place])      Family History  Family history unknown: Yes     Social History:  reports that she has never smoked. She has never used smokeless tobacco. She reports that she does not currently use alcohol. She reports current drug use. Drug: Marijuana.   Exam: Current vital signs: BP 133/90 (BP Location: Left Arm)    Pulse 72    Temp 98.2 F (36.8 C) (Axillary)    Resp 20    Ht 5\' 3"  (1.6 m)    Wt 104 kg    LMP 08/28/2020    SpO2 94%    BMI 40.61 kg/m  Vital signs in last 24 hours: Temp:  [98.2 F (36.8 C)-98.4 F (36.9 C)] 98.2 F (36.8 C) (01/26 2017) Pulse Rate:  [61-78] 72 (01/26 2017) Resp:  [16-25] 20 (01/26 2017) BP: (102-133)/(70-108) 133/90 (01/26 2017) SpO2:  [94 %-100 %] 94 % (01/26 2017) Weight:  [104 kg] 104 kg (01/26 1118)   Physical Exam  Constitutional: Appears well-developed and well-nourished.  Psych: Affect appropriate to situation Eyes: No  scleral injection HENT: No OP obstruction MSK: no joint deformities.  Cardiovascular: Normal rate and regular rhythm.  Respiratory: Effort normal, non-labored breathing GI: Soft.  No distension. There is no tenderness.  Skin: WDI  Neuro: Mental Status: Patient is awake, alert, oriented to person, place, month, year, and situation. Patient is able to give a clear and coherent history. No signs of aphasia or neglect Cranial Nerves: II: Visual Fields are full. Pupils are equal, round, and reactive to light.   III,IV, VI: EOMI without ptosis or diploplia.  V: Facial sensation is symmetric to temperature VII: Facial movement is symmetric.  VIII: hearing is intact to voice X: Uvula elevates symmetrically XI: Shoulder shrug is symmetric. XII: tongue is midline without atrophy or fasciculations.  Motor: Tone is normal. Bulk is normal. 5/5 strength was present in all four extremities.  Sensory: Sensation is symmetric to light touch and temperature in the arms and legs. Cerebellar: FNF intact bilaterally    I have reviewed labs in epic and the results pertinent to this consultation are: Creatinine 0.79  I have reviewed the images obtained: CT head - negative  Impression: 51 year old female with recurrent episodes of confusional states most likely representing right temporal seizures.  Given the report of staring spells of unclear frequency, to which she  is amnestic, I do think that an overnight EEG to assess for subclinical seizures would be prudent.    My suspicion is that this is breakthrough due to noncompliance, therefore I will not make any changes to her medications unless seizures are seen on EEG.  Recommendations: 1) Vimpat 100 mg twice daily 2) overnight EEG 3) if overnight EEG is negative, she can be discharged on 100 mg twice daily to follow-up with Dr.Camara   Roland Rack, MD [facility] [phone]  If 7pm- 7am, please page neurology on call  as listed in [place].

## 2021-09-29 NOTE — ED Provider Notes (Signed)
[place] EMERGENCY DEPARTMENT Provider Note   CSN: [id number] Arrival date & time: 09/29/21  1107     History  No chief complaint on file.   Meredith Park is a 51 y.o. female.  Patient is a 51 yo female recently diagnosed with epilepsy presenting for feeling strange. Pt states she awoke this morning feeling confused and forgetful. Mother at bedside she was extremly confused upon waking. States she missed her dose of Vimpap last night. No witnessed seizure over night.   The history is provided by the patient. No language interpreter was used.      Home Medications Prior to Admission medications   Medication Sig Start Date End Date Taking? Authorizing Provider  carvedilol (COREG) 12.5 MG tablet Take 1 tablet (12.5 mg total) by mouth 2 (two) times daily. 08/24/17   Domenic Moras, PA-C  folic acid (FOLVITE) 1 MG tablet Take 1 tablet (1 mg total) by mouth daily. 08/30/18   Aline August, MD  Lacosamide 100 MG TABS Take 1 tablet (100 mg total) by mouth in the morning and at bedtime. 09/06/21 10/06/21  Alric Ran, MD  losartan (COZAAR) 50 MG tablet Take 50 mg by mouth daily. 07/12/21   [provider]  Multiple Vitamin (MULTIVITAMIN WITH MINERALS) TABS tablet Take 1 tablet by mouth daily. 08/30/18   Aline August, MD  oxymetazoline (AFRIN) 0.05 % nasal spray Place 1 spray into both nostrils 2 (two) times daily as needed for congestion.    [provider]  SUMAtriptan (IMITREX) 100 MG tablet Take 100 mg by mouth as needed for migraine.  03/30/17   [provider]      Allergies    Losartan potassium-hctz and Meperidine    Review of Systems   Review of Systems  Constitutional:  Negative for chills and fever.  HENT:  Negative for ear pain and sore throat.   Eyes:  Negative for pain and visual disturbance.  Respiratory:  Negative for cough and shortness of breath.   Cardiovascular:  Negative for chest pain and palpitations.   Gastrointestinal:  Negative for abdominal pain and vomiting.  Genitourinary:  Negative for dysuria and hematuria.  Musculoskeletal:  Negative for arthralgias and back pain.  Skin:  Negative for color change and rash.  Neurological:  Negative for seizures and syncope.  Psychiatric/Behavioral:  Positive for confusion.   All other systems reviewed and are negative.  Physical Exam Updated Vital Signs BP (!) 123/108 (BP Location: Left Arm)    Pulse 68    Temp 98.4 F (36.9 C) (Oral)    Resp 16    Ht 5\' 3"  (1.6 m)    Wt 104 kg    LMP 08/28/2020    SpO2 96%    BMI 40.61 kg/m  Physical Exam Vitals and nursing note reviewed.  Constitutional:      General: She is not in acute distress.    Appearance: She is well-developed.  HENT:     Head: Normocephalic and atraumatic.  Eyes:     General: Lids are normal. Vision grossly intact.     Conjunctiva/sclera: Conjunctivae normal.     Pupils: Pupils are equal, round, and reactive to light.     Visual Fields: Right eye visual fields normal and left eye visual fields normal.  Cardiovascular:     Rate and Rhythm: Normal rate and regular rhythm.     Heart sounds: No murmur heard. Pulmonary:     Effort: Pulmonary effort is normal. No respiratory distress.  Breath sounds: Normal breath sounds.  Abdominal:     Palpations: Abdomen is soft.     Tenderness: There is no abdominal tenderness.  Musculoskeletal:        General: No swelling.     Cervical back: Neck supple.  Skin:    General: Skin is warm and dry.     Capillary Refill: Capillary refill takes less than 2 seconds.  Neurological:     General: No focal deficit present.     Mental Status: She is alert and oriented to person, place, and time.     GCS: GCS eye subscore is 4. GCS verbal subscore is 5. GCS motor subscore is 6.     Cranial Nerves: Cranial nerves 2-12 are intact.     Sensory: Sensation is intact.     Motor: Motor function is intact.     Coordination: Coordination is intact.      Gait: Gait is intact.  Psychiatric:        Mood and Affect: Mood normal.    ED Results / Procedures / Treatments   Labs (all labs ordered are listed, but only abnormal results are displayed) Labs Reviewed - No data to display  EKG None  Radiology No results found.  Procedures Procedures    Medications Ordered in ED Medications - No data to display  ED Course/ Medical Decision Making/ A&P                           Medical Decision Making Amount and/or Complexity of Data Reviewed Labs: ordered. Radiology: ordered.  Risk OTC drugs. Decision regarding hospitalization.   11:37 AM 51 yo female recently diagnosed with epilepsy presenting for feeling strange upon waking after missed dose of Vimpat.   No witnessed seizure overnight. Patient's mom is at bedside, states that when patient awoke this morning she was confused an didn't know the name of her daughter. On my evaluation patient is Aox3, no acute distress, afebrile, with stable vitals. Physical exam demonstrates no neurovascular deficits. CTH demonstrates no acute process.  Called to beside, patient's mother said patient had another episode of confusion and was calling out for help. Denies shaking or rhythmic motion.  Pt recommended for transfer for neurology eval. Concern for break through seizures verus pseudoseizure. Neurologist at [facility] recommends transfer and admission, will be on consult.         Final Clinical Impression(s) / ED Diagnoses Final diagnoses:  Altered mental status, unspecified altered mental status type    Rx / DC Orders ED Discharge Orders     None         Lianne Cure, DO [id number]

## 2021-09-29 NOTE — ED Notes (Signed)
Amb to BR w/o difficulty ua collected

## 2021-09-29 NOTE — ED Notes (Signed)
Mother of patient reports patient told her she felt like she was about to have a seizure. When this RN went into the room the patient had her eyes closed, no convulsions or involuntary movement noted, patient would open and close her eyes when I said her name, however, patient appeared confused and took her a couple of minutes to be able to state her name which is currently her only orientation. Dr Wallace Cullens informed. Seizure precautions taken.

## 2021-09-30 ENCOUNTER — Observation Stay (HOSPITAL_COMMUNITY): Payer: BC Managed Care – PPO

## 2021-09-30 DIAGNOSIS — F1210 Cannabis abuse, uncomplicated: Secondary | ICD-10-CM

## 2021-09-30 DIAGNOSIS — E876 Hypokalemia: Secondary | ICD-10-CM

## 2021-09-30 DIAGNOSIS — R4182 Altered mental status, unspecified: Secondary | ICD-10-CM

## 2021-09-30 DIAGNOSIS — I10 Essential (primary) hypertension: Secondary | ICD-10-CM | POA: Diagnosis not present

## 2021-09-30 DIAGNOSIS — R569 Unspecified convulsions: Secondary | ICD-10-CM | POA: Diagnosis not present

## 2021-09-30 LAB — COMPREHENSIVE METABOLIC PANEL
ALT: 38 U/L (ref 0–44)
AST: 24 U/L (ref 15–41)
Albumin: 3.7 g/dL (ref 3.5–5.0)
Alkaline Phosphatase: 84 U/L (ref 38–126)
Anion gap: 9 (ref 5–15)
BUN: 11 mg/dL (ref 6–20)
CO2: 23 mmol/L (ref 22–32)
Calcium: 8.7 mg/dL — ABNORMAL LOW (ref 8.9–10.3)
Chloride: 104 mmol/L (ref 98–111)
Creatinine, Ser: 0.79 mg/dL (ref 0.44–1.00)
GFR, Estimated: >60 mL/min (ref >60)
Glucose, Bld: 112 mg/dL — ABNORMAL HIGH (ref 70–99)
Potassium: 3.4 mmol/L — ABNORMAL LOW (ref 3.5–5.1)
Sodium: 136 mmol/L (ref 135–145)
Total Bilirubin: 0.6 mg/dL (ref 0.3–1.2)
Total Protein: 6.1 g/dL — ABNORMAL LOW (ref 6.5–8.1)

## 2021-09-30 LAB — CBC
HCT: 35.5 % — ABNORMAL LOW (ref 36.0–46.0)
Hemoglobin: 12.1 g/dL (ref 12.0–15.0)
MCH: 28 pg (ref 26.0–34.0)
MCHC: 34.1 g/dL (ref 30.0–36.0)
MCV: 82.2 fL (ref 80.0–100.0)
Platelets: 176 10*3/uL (ref 150–400)
RBC: 4.32 MIL/uL (ref 3.87–5.11)
RDW: 13.5 % (ref 11.5–15.5)
WBC: 7.4 10*3/uL (ref 4.0–10.5)
nRBC: 0 % (ref 0.0–0.2)

## 2021-09-30 LAB — HIV ANTIBODY (ROUTINE TESTING W REFLEX): HIV Screen 4th Generation wRfx: NONREACTIVE

## 2021-09-30 MED ORDER — POTASSIUM CHLORIDE CRYS ER 20 MEQ PO TBCR
40.0000 meq | EXTENDED_RELEASE_TABLET | Freq: Once | ORAL | Status: AC
  Administered 2021-09-30: 40 meq via ORAL
  Filled 2021-09-30: qty 2

## 2021-09-30 NOTE — Assessment & Plan Note (Signed)
Follows with therapist-not on any medications.

## 2021-09-30 NOTE — Plan of Care (Signed)
°  Problem: Education: Goal: Expressions of having a comfortable level of knowledge regarding the disease process will increase Outcome: Progressing   Problem: Coping: Goal: Ability to adjust to condition or change in health will improve Outcome: Progressing Goal: Ability to identify appropriate support needs will improve Outcome: Progressing   Problem: Medication: Goal: Risk for medication side effects will decrease Outcome: Progressing   Problem: Health Behavior/Discharge Planning: Goal: Compliance with prescribed medication regimen will improve Outcome: Not Progressing

## 2021-09-30 NOTE — Assessment & Plan Note (Addendum)
In remission for 8 years-goes to [employer]  No signs of withdrawal symptoms.

## 2021-09-30 NOTE — Assessment & Plan Note (Signed)
Replete and recheck ?

## 2021-09-30 NOTE — Procedures (Signed)
Patient Name: Meredith Park  MRN: [id number]  Epilepsy Attending: Charlsie Quest  Referring Physician/Provider: Rejeana Brock, MD Duration: 09/30/2021 0253 to 10/01/2021 0253  Patient history:  51 year old female with recurrent episodes of confusional states most likely representing right temporal seizures.  EEG to evaluate for seizure.  Level of alertness: Awake, asleep  AEDs during EEG study: LCM  Technical aspects: This EEG study was done with scalp electrodes positioned according to the 10-20 International system of electrode placement. Electrical activity was acquired at a sampling rate of 500Hz  and reviewed with a high frequency filter of 70Hz  and a low frequency filter of 1Hz . EEG data were recorded continuously and digitally stored.   Description: The posterior dominant rhythm consists of 9 Hz activity of moderate voltage (25-35 uV) seen predominantly in posterior head regions, symmetric and reactive to eye opening and eye closing. Sleep was characterized by vertex waves, sleep spindles (12 to 14 Hz), maximal frontocentral region. Physiologic photic driving was seen during photic stimulation.  No EEG change was seen during hyperventilation     IMPRESSION: This study is within normal limits. No seizures or epileptiform discharges were seen throughout the recording.  Londan Coplen 

## 2021-09-30 NOTE — TOC Initial Note (Signed)
Transition of Care [facility]) - Initial/Assessment Note    Patient Details  Name: Meredith Park MRN: [id number] Date of Birth: [birth year]  Transition of Care [facility]) CM/SW Contact:    Lockie Pares, RN Phone Number: 09/30/2021, 9:58 AM  Clinical Narrative:                 The Transition of Care Department [facility]) has reviewed patient and no TOC needs have been identified at this time. We will continue to monitor patient advancement through interdisciplinary progression rounds. If new patient transition needs arise, please place a TOC consult         Patient Goals and CMS Choice        Expected Discharge Plan and Services                                                Prior Living Arrangements/Services                       Activities of Daily Living Home Assistive Devices/Equipment: None ADL Screening (condition at time of admission) Patient's cognitive ability adequate to safely complete daily activities?: Yes Is the patient deaf or have difficulty hearing?: No Does the patient have difficulty seeing, even when wearing glasses/contacts?: No Does the patient have difficulty concentrating, remembering, or making decisions?: No Patient able to express need for assistance with ADLs?: Yes Does the patient have difficulty dressing or bathing?: No Independently performs ADLs?: Yes (appropriate for developmental age) Does the patient have difficulty walking or climbing stairs?: No Weakness of Legs: None Weakness of Arms/Hands: None  Permission Sought/Granted                  Emotional Assessment              Admission diagnosis:  Seizure (HCC) [R56.9] Altered mental status, unspecified altered mental status type [R41.82] Patient Active Problem List   Diagnosis Date Noted   Hypokalemia 09/30/2021   Cannabis abuse-Urine drug screen +ve on 1/26 09/30/2021   Seizure (HCC) 09/29/2021   Alcohol dependence in remission (HCC) 04/12/2018    Adjustment disorder with mixed anxiety and depressed mood 08/26/2017   Essential (primary) hypertension 08/26/2017   Mixed hyperlipidemia 08/26/2017   PCP:  Burnis Medin, PA-C Pharmacy:   [id number] - [place], [place] - [address] [place] [address] Phone: [phone] Fax: [phone]     Social Determinants of Health (SDOH) Interventions    Readmission Risk Interventions No flowsheet data found.

## 2021-09-30 NOTE — Assessment & Plan Note (Signed)
BP stable-continue Coreg/losartan.

## 2021-09-30 NOTE — Progress Notes (Incomplete)
EEG maintenance performed.  No skin breakdown observed at electrode sites Fp1, Fp2. 

## 2021-09-30 NOTE — Assessment & Plan Note (Signed)
Counseled-Per patient-she consumes edibles.

## 2021-09-30 NOTE — Assessment & Plan Note (Addendum)
Getting a long-term EEG, spot EEG unremarkable Case discussed with neurology defer management to them, Vimpat on hold with as needed IV Ativan on board.  Currently seems to be seizure-free Case discussed with neurologist on 10/02/2021 continue to monitor for another 24 hours in hopes of capturing an event on EEG

## 2021-09-30 NOTE — Hospital Course (Addendum)
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    Significant events: 1/26>> admit to hospitalist service-for suspicion of breakthrough seizures.  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  No acute abnormalities. 09/29/2021>> CT head: No acute intracranial abnormalities.  Microbiology data: 1/26>> COVID/flu PCR: Negative

## 2021-09-30 NOTE — Progress Notes (Signed)
PROGRESS NOTE        PATIENT DETAILS Name: Meredith Park Age: 51 y.o. Sex: female Date of Birth: 10/27/1970 Admit Date: 09/29/2021 Admitting Physician Lequita Halt, MD [id number], [place], [place]  Brief Summary: 51 year old female with history of seizures related to alcohol use-recently evaluated by outpatient neurology with EEG/MRI brain-and subsequently started on Keppra which was switched to Vimpat (due to side effects)-presented to the hospital on 1/26 with episodes of confusion/staring-concern for breakthrough seizures.    Significant events: 1/26>> admit to hospitalist service-for suspicion of breakthrough seizures.  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  No acute abnormalities. 09/29/2021>> CT head: No acute intracranial abnormalities.  Microbiology data: 1/26>> COVID/flu PCR: Negative  Subjective: Lying comfortably in bed-denies any chest pain or shortness of breath.  Objective: Vitals: Blood pressure (!) 124/93, pulse 81, temperature 97.9 F (36.6 C), temperature source Axillary, resp. rate 18, height 5\' 3"  (1.6 m), weight 104 kg, last menstrual period 08/28/2020, SpO2 92 %.   Exam: Gen Exam:Alert awake-not in any distress HEENT:atraumatic, normocephalic Chest: B/L clear to auscultation anteriorly CVS:S1S2 regular Abdomen:soft non tender, non distended Extremities:no edema Neurology: Non focal Skin: no rash  Pertinent Labs/Radiology: CBC Latest Ref Rng & Units 09/30/2021 09/29/2021 07/12/2021  WBC 4.0 - 10.5 K/uL 7.4 7.0 7.5  Hemoglobin 12.0 - 15.0 g/dL 12.1 12.9 13.3  Hematocrit 36.0 - 46.0 % 35.5(L) 37.7 39.9  Platelets 150 - 400 K/uL 176 199 203    Lab Results  Component Value Date   NA 136 09/30/2021   K 3.4 (L) 09/30/2021   CL 104 09/30/2021   CO2 23 09/30/2021     Assessment/Plan: * Seizure ([place]) Plans are for LTM EEG-Vimpat  has been continued-we will await further neurology input.  Hypokalemia Replete and recheck.  Alcohol dependence in remission ([place]) In remission for 8 years-goes to [employer] meetings.  No signs of withdrawal symptoms.  Adjustment disorder with mixed anxiety and depressed mood- (present on admission) Follows with therapist-not on any medications.  Essential (primary) hypertension- (present on admission) BP stable-continue Coreg/losartan.  Cannabis abuse-Urine drug screen +ve on 1/26 Counseled-Per patient-she consumes edibles.   Morbid Obesity: Estimated body mass index is 40.61 kg/m as calculated from the following:   Height as of this encounter: 5\' 3"  (1.6 m).   Weight as of this encounter: 104 kg.    DVT Prophylaxis: SQ Lovenox Procedures: None Consults: Neurology Code Status:Full code  Family Communication: None at bedside   Disposition Plan: Status is: Observation  The patient remains OBS appropriate and will d/c before 2 midnights.  LTM EEG in progress-await recommendations from neurology before consideration of discharge.       Diet: Diet Order             Diet regular Room service appropriate? Yes; Fluid consistency: Thin  Diet effective now                     Antimicrobial agents: Anti-infectives (From admission, onward)    None        MEDICATIONS: Scheduled Meds:  carvedilol  12.5 mg Oral BID   enoxaparin (LOVENOX) injection  50 mg Subcutaneous Q24H   lacosamide  100 mg Oral BID   losartan  50 mg Oral Daily   sodium chloride flush  3 mL  Intravenous Q12H   Continuous Infusions: PRN Meds:.acetaminophen **OR** acetaminophen, ibuprofen, polyethylene glycol   I have personally reviewed following labs and imaging studies  LABORATORY DATA: CBC: Recent Labs  Lab 09/29/21 1152 09/30/21 0136  WBC 7.0 7.4  HGB 12.9 12.1  HCT 37.7 35.5*  MCV 81.6 82.2  PLT 199 0000000    Basic Metabolic Panel: Recent Labs  Lab 09/29/21 1152  09/30/21 0136  NA 138 136  K 4.2 3.4*  CL 107 104  CO2 23 23  GLUCOSE 121* 112*  BUN 13 11  CREATININE 0.79 0.79  CALCIUM 8.9 8.7*  MG 1.8  --     GFR: Estimated Creatinine Clearance: 98 mL/min (by C-G formula based on SCr of 0.79 mg/dL).  Liver Function Tests: Recent Labs  Lab 09/29/21 1152 09/30/21 0136  AST 33 24  ALT 46* 38  ALKPHOS 92 84  BILITOT 0.7 0.6  PROT 6.9 6.1*  ALBUMIN 4.1 3.7   No results for input(s): LIPASE, AMYLASE in the last 168 hours. Recent Labs  Lab 09/29/21 1152  AMMONIA 22    Coagulation Profile: No results for input(s): INR, PROTIME in the last 168 hours.  Cardiac Enzymes: No results for input(s): CKTOTAL, CKMB, CKMBINDEX, TROPONINI in the last 168 hours.  BNP (last 3 results) No results for input(s): PROBNP in the last 8760 hours.  Lipid Profile: No results for input(s): CHOL, HDL, LDLCALC, TRIG, CHOLHDL, LDLDIRECT in the last 72 hours.  Thyroid Function Tests: No results for input(s): TSH, T4TOTAL, FREET4, T3FREE, THYROIDAB in the last 72 hours.  Anemia Panel: No results for input(s): VITAMINB12, FOLATE, FERRITIN, TIBC, IRON, RETICCTPCT in the last 72 hours.  Urine analysis:    Component Value Date/Time   COLORURINE YELLOW 07/12/2021 1542   APPEARANCEUR HAZY (A) 07/12/2021 1542   LABSPEC >=1.030 07/12/2021 1542   PHURINE 6.5 07/12/2021 1542   GLUCOSEU NEGATIVE 07/12/2021 1542   HGBUR NEGATIVE 07/12/2021 [employer] 07/12/2021 1542   KETONESUR NEGATIVE 07/12/2021 1542   PROTEINUR NEGATIVE 07/12/2021 1542   NITRITE NEGATIVE 07/12/2021 1542   LEUKOCYTESUR NEGATIVE 07/12/2021 1542    Sepsis Labs: Lactic Acid, Venous No results found for: LATICACIDVEN  MICROBIOLOGY: Recent Results (from the past 240 hour(s))  Resp Panel by RT-PCR (Flu A&B, Covid) Nasopharyngeal Swab     Status: None   Collection Time: 09/29/21  3:57 PM   Specimen: Nasopharyngeal Swab; Nasopharyngeal(NP) swabs in vial transport medium   Result Value Ref Range Status   SARS Coronavirus 2 by RT PCR NEGATIVE NEGATIVE Final    Comment: (NOTE) SARS-CoV-2 target nucleic acids are NOT DETECTED.  The SARS-CoV-2 RNA is generally detectable in upper respiratory specimens during the acute phase of infection. The lowest concentration of SARS-CoV-2 viral copies this assay can detect is 138 copies/mL. A negative result does not preclude SARS-Cov-2 infection and should not be used as the sole basis for treatment or other patient management decisions. A negative result may occur with  improper specimen collection/handling, submission of specimen other than nasopharyngeal swab, presence of viral mutation(s) within the areas targeted by this assay, and inadequate number of viral copies(<138 copies/mL). A negative result must be combined with clinical observations, patient history, and epidemiological information. The expected result is Negative.  Fact Sheet for Patients:  EntrepreneurPulse.com.au  Fact Sheet for Healthcare Providers:  IncredibleEmployment.be  This test is no t yet approved or cleared by the [place] FDA and  has been authorized for detection and/or diagnosis of SARS-CoV-2 by  FDA under an Emergency Use Authorization (EUA). This EUA will remain  in effect (meaning this test can be used) for the duration of the COVID-19 declaration under Section 564(b)(1) of the Act, 21 U.S.C.section 360bbb-3(b)(1), unless the authorization is terminated  or revoked sooner.       Influenza A by PCR NEGATIVE NEGATIVE Final   Influenza B by PCR NEGATIVE NEGATIVE Final    Comment: (NOTE) The Xpert Xpress SARS-CoV-2/FLU/RSV plus assay is intended as an aid in the diagnosis of influenza from Nasopharyngeal swab specimens and should not be used as a sole basis for treatment. Nasal washings and aspirates are unacceptable for Xpert Xpress SARS-CoV-2/FLU/RSV testing.  Fact Sheet for  Patients: EntrepreneurPulse.com.au  Fact Sheet for Healthcare Providers: IncredibleEmployment.be  This test is not yet approved or cleared by the [place] FDA and has been authorized for detection and/or diagnosis of SARS-CoV-2 by FDA under an Emergency Use Authorization (EUA). This EUA will remain in effect (meaning this test can be used) for the duration of the COVID-19 declaration under Section 564(b)(1) of the Act, 21 U.S.C. section 360bbb-3(b)(1), unless the authorization is terminated or revoked.  Performed at [facility], [address], [place], [place] [address]     RADIOLOGY STUDIES/RESULTS: CT Head Wo Contrast  Result Date: 09/29/2021 CLINICAL DATA:  Altered mental status, nontraumatic. Headache. Patient feels "out of it." EXAM: CT HEAD WITHOUT CONTRAST TECHNIQUE: Contiguous axial images were obtained from the base of the skull through the vertex without intravenous contrast. RADIATION DOSE REDUCTION: This exam was performed according to the departmental dose-optimization program which includes automated exposure control, adjustment of the mA and/or kV according to patient size and/or use of iterative reconstruction technique. COMPARISON:  CT brain 08/05/2021 FINDINGS: Brain: The ventricles are normal in size and configuration. The basilar cisterns are patent. No mass, mass effect, or midline shift. No acute intracranial hemorrhage is seen. No abnormal extra-axial fluid collection. Preservation of the normal cortical gray-white interface without CT evidence of an acute major vascular territorial cortical based infarction. Vascular: No hyperdense vessel or unexpected calcification. Skull: Normal. Negative for fracture or focal lesion. Sinuses/Orbits: The visualized orbits are unremarkable. The visualized paranasal sinuses and mastoid air cells are clear. Other: None. Electronically Signed   By: Yvonne Kendall M.D.   On: 09/29/2021  14:16     LOS: 0 days   Oren Binet, MD  [facility]    To contact the attending provider between 7A-7P or the covering provider during after hours 7P-7A, please log into the web site [email or web address] and access using universal [place] password for that web site. If you do not have the password, please call the hospital operator.  09/30/2021, 11:05 AM

## 2021-09-30 NOTE — Progress Notes (Signed)
Subjective: No events overnight.  Patient states she is usually not aware of her episodes but her daughter has noticed her staring off.  ROS: negative except above  Examination  Vital signs in last 24 hours: Temp:  [97.6 F (36.4 C)-98.4 F (36.9 C)] 97.9 F (36.6 C) (01/27 0827) Pulse Rate:  [61-81] 81 (01/27 0827) Resp:  [15-25] 18 (01/27 0827) BP: (102-139)/(70-108) 124/93 (01/27 0827) SpO2:  [92 %-100 %] 92 % (01/27 0827) Weight:  [104 kg] 104 kg (01/26 1118)  General: lying in bed, NAD CVS: pulse-normal rate and rhythm RS: breathing comfortably, CTAB Extremities: normal  Neuro: AOX3, CN 2-12 intact grossly, 5/5 in all extremities, FTN intact BL  Basic Metabolic Panel: Recent Labs  Lab 09/29/21 1152 09/30/21 0136  NA 138 136  K 4.2 3.4*  CL 107 104  CO2 23 23  GLUCOSE 121* 112*  BUN 13 11  CREATININE 0.79 0.79  CALCIUM 8.9 8.7*  MG 1.8  --     CBC: Recent Labs  Lab 09/29/21 1152 09/30/21 0136  WBC 7.0 7.4  HGB 12.9 12.1  HCT 37.7 35.5*  MCV 81.6 82.2  PLT 199 176     Coagulation Studies: No results for input(s): LABPROT, INR in the last 72 hours.  Imaging MRI brain with and without contrast 09/14/2021: MRI scan of the brain with and without contrast showing only mild nonspecific changes of chronic small vessel disease.  Incidental calcified small pineal region cyst is noted.   ASSESSMENT AND PLAN: 51 year old female with recurrent episodes of alteration of awareness.   Transient alteration of awareness -LTM EEG did not show any ictal-interictal abnormality overnight -I reviewed patient's routine EEG from 08/23/2021.  Recommendations -Continue LTM EEG characterization of spells -Hold Vimpat for provocation of spells -Continue seizure precautions -As needed IV Ativan 2 mg clinical seizure-like activity lasting more than 5 minutes -Management of rest of comorbidities per primary team  I have spent a total of  36  minutes with the patient  reviewing hospital notes,  test results, labs and examining the patient as well as establishing an assessment and plan that was discussed personally with the patient.  > 50% of time was spent in direct patient care.   Lindie Spruce Epilepsy [facility] For questions after 5pm please refer to AMION to reach the Neurologist on call

## 2021-10-01 DIAGNOSIS — F1210 Cannabis abuse, uncomplicated: Secondary | ICD-10-CM | POA: Diagnosis present

## 2021-10-01 DIAGNOSIS — R413 Other amnesia: Secondary | ICD-10-CM | POA: Diagnosis present

## 2021-10-01 DIAGNOSIS — G40909 Epilepsy, unspecified, not intractable, without status epilepticus: Secondary | ICD-10-CM | POA: Diagnosis present

## 2021-10-01 DIAGNOSIS — Z6841 Body Mass Index (BMI) 40.0 and over, adult: Secondary | ICD-10-CM | POA: Diagnosis not present

## 2021-10-01 DIAGNOSIS — Z7151 Drug abuse counseling and surveillance of drug abuser: Secondary | ICD-10-CM | POA: Diagnosis not present

## 2021-10-01 DIAGNOSIS — Z79899 Other long term (current) drug therapy: Secondary | ICD-10-CM | POA: Diagnosis not present

## 2021-10-01 DIAGNOSIS — Z888 Allergy status to other drugs, medicaments and biological substances status: Secondary | ICD-10-CM | POA: Diagnosis not present

## 2021-10-01 DIAGNOSIS — E876 Hypokalemia: Secondary | ICD-10-CM | POA: Diagnosis present

## 2021-10-01 DIAGNOSIS — I10 Essential (primary) hypertension: Secondary | ICD-10-CM | POA: Diagnosis present

## 2021-10-01 DIAGNOSIS — F4323 Adjustment disorder with mixed anxiety and depressed mood: Secondary | ICD-10-CM | POA: Diagnosis present

## 2021-10-01 DIAGNOSIS — F1021 Alcohol dependence, in remission: Secondary | ICD-10-CM | POA: Diagnosis present

## 2021-10-01 DIAGNOSIS — R569 Unspecified convulsions: Secondary | ICD-10-CM | POA: Diagnosis present

## 2021-10-01 DIAGNOSIS — E7800 Pure hypercholesterolemia, unspecified: Secondary | ICD-10-CM | POA: Diagnosis present

## 2021-10-01 DIAGNOSIS — Z20822 Contact with and (suspected) exposure to covid-19: Secondary | ICD-10-CM | POA: Diagnosis present

## 2021-10-01 LAB — GLUCOSE, CAPILLARY: Glucose-Capillary: 116 mg/dL — ABNORMAL HIGH (ref 70–99)

## 2021-10-01 LAB — BASIC METABOLIC PANEL
Anion gap: 7 (ref 5–15)
BUN: 10 mg/dL (ref 6–20)
CO2: 22 mmol/L (ref 22–32)
Calcium: 8.9 mg/dL (ref 8.9–10.3)
Chloride: 108 mmol/L (ref 98–111)
Creatinine, Ser: 0.75 mg/dL (ref 0.44–1.00)
GFR, Estimated: >60 mL/min (ref >60)
Glucose, Bld: 119 mg/dL — ABNORMAL HIGH (ref 70–99)
Potassium: 3.6 mmol/L (ref 3.5–5.1)
Sodium: 137 mmol/L (ref 135–145)

## 2021-10-01 LAB — MAGNESIUM: Magnesium: 1.9 mg/dL (ref 1.7–2.4)

## 2021-10-01 MED ORDER — POTASSIUM CHLORIDE CRYS ER 20 MEQ PO TBCR
40.0000 meq | EXTENDED_RELEASE_TABLET | Freq: Once | ORAL | Status: AC
Start: 2021-10-01 — End: 2021-10-01
  Administered 2021-10-01: 40 meq via ORAL
  Filled 2021-10-01: qty 2

## 2021-10-01 NOTE — Progress Notes (Signed)
Neurology Progress Note  Brief HPI: 51 year old patient with history of ETOH abuse (sober for two years), HLD, HTN, seizures and adjustment disorder presents after having three seizure-like episodes at home.  During the first episode, she was working from home and at a meeting and was told that she was acting strangely and was unable to answer questions.  She had the second episode at her therapist's office and was noted to be staring off into space.  She also reports leading her [employer] meeting, looking at the clock and realizing it was almost time for the meeting to start, then having no memory of events for the next 35 minutes.  Others in the meeting stated that during that time she was acting strangely and no comprehending things that were said to her.  She denied having any tics or abnormal movements like lipsmacking during these times.  Patient then went to the ED and had another episode without tonic-clonic activity but with some confusion afterwards.  Patient states that she often notices an odd smell or taste prior to seizures and that her daughter tells her that she will sometimes "stop" for 10-20 seconds and not respond to stimuli.  No tics occur during these times.  She states that she often has a bad headache after seizures.  Subjective: Patient reports that she is tired of being connected to monitors but is willing to continue EEG monitoring to further characterize these seizures.   Exam: Vitals:   10/01/21 0359 10/01/21 0722  BP: 117/76 124/85  Pulse: 61 68  Resp: 17 13  Temp: 98.3 F (36.8 C) 98 F (36.7 C)  SpO2: 92% 94%   Gen: In bed, NAD Resp: non-labored breathing, no acute distress  Neuro: Mental Status: alert and oriented x3, speech fluent, naming intact Motor: moves all extremities purposefully Sensory:intact to light touch and symmetrical Gait: Deferred  Pertinent Labs: CBC    Component Value Date/Time   WBC 7.4 09/30/2021 0136   RBC 4.32 09/30/2021 0136   HGB 12.1  09/30/2021 0136   HCT 35.5 (L) 09/30/2021 0136   PLT 176 09/30/2021 0136   MCV 82.2 09/30/2021 0136   MCH 28.0 09/30/2021 0136   MCHC 34.1 09/30/2021 0136   RDW 13.5 09/30/2021 0136   LYMPHSABS 1.3 07/12/2021 1438   MONOABS 0.4 07/12/2021 1438   EOSABS 0.1 07/12/2021 1438   BASOSABS 0.0 07/12/2021 1438    BMP Latest Ref Rng & Units 10/01/2021 09/30/2021 09/29/2021  Glucose 70 - 99 mg/dL 962(X) 528(U) 132(G)  BUN 6 - 20 mg/dL 10 11 13   Creatinine 0.44 - 1.00 mg/dL 4.01 0.27  Sodium 135 - 145 mmol/L 137 136 138  Potassium 3.5 - 5.1 mmol/L 3.6 3.4(L) 4.2  Chloride 98 - 111 mmol/L 108 104 107  CO2 22 - 32 mmol/L 22 23 23   Calcium 8.9 - 10.3 mg/dL 8.9 2.53) 8.9     Imaging Reviewed: CT head 1/26 with no acute abnormalities  LTM EEG overnight: Normal with no seizures EEG from 08/23/2021 revealed right temporal sharps and intermittent right temporal slowing.  Assessment: 51 yo patient with history of ETOH abuse, HLD and HTN presents with multiple breakthrough seizure episodes at home.  LTM EEG has shown no seizure activity so far, so will continue to hold Vimpat and continue LTM EEG monitoring for 24 hours longer in an attempt to observe and characterize seizure episodes.  Recommendations: 1) Continue to hold Vimpat 2) Continue LTM EEG for 24 hours longer to further  characterize seizure episodes 3) Ativan 2 mg IV for seizure activity >5 minutes 4) Continue seizure precautions   Discussed [facility] statutes, patients with seizures are not allowed to drive until they have been seizure-free for six months Use caution when using heavy equipment or power tools. Avoid working on ladders or at heights. Take showers instead of baths. Ensure the water temperature is not too high on the home water heater. Do not go swimming alone. Do not lock yourself in a room alone (i.e. bathroom). When caring for infants or small children, sit down when holding, feeding, or changing them to  minimize risk of injury to the child in the event you have a seizure. Maintain good sleep hygiene. Avoid alcohol.    Cortney E Ernestina Columbia , MSN, AGACNP-BC [facility] See Amion for schedule and pager information 10/01/2021 10:32 AM    Attending Neurohospitalist Addendum Patient seen and examined with APP/Resident. Agree with the history and physical as documented above. Agree with the plan as documented, which I helped formulate. I have independently reviewed the chart, obtained history, review of systems and examined the patient.I have personally reviewed pertinent head/neck/spine imaging (CT/MRI).  Discussed EEG with Dr. Melynda Ripple.  Discussed the plan with Dr. Thedore Mins.  Discussed the plan in detail with the patient as well. Please feel free to call with any questions.  -- Milon Dikes, MD Neurologist [facility] Pager: [phone]

## 2021-10-01 NOTE — Progress Notes (Signed)
LTM maint complete - no skin breakdown under: Fp1 Fp2 F8 Reapplied Fp1 Fp2 F8 [facility] monitored, Event button test confirmed by [facility].

## 2021-10-01 NOTE — Progress Notes (Signed)
PROGRESS NOTE        PATIENT DETAILS Name: Meredith Park Age: 51 y.o. 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    Significant events: 1/26>> admit to hospitalist service-for suspicion of breakthrough seizures.  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  No acute abnormalities. 09/29/2021>> CT head: No acute intracranial abnormalities.  Microbiology data: 1/26>> COVID/flu PCR: Negative  Subjective:  Patient in bed, appears comfortable, denies any headache, no fever, no chest pain or pressure, no shortness of breath , no abdominal pain. No new focal weakness.   Objective: Vitals: Blood pressure 124/85, pulse 68, temperature 98 F (36.7 C), temperature source Oral, resp. rate 13, height 5\' 3"  (1.6 m), weight 104 kg, last menstrual period 08/28/2020, SpO2 94 %.   Exam:  Awake Alert, No new F.N deficits, Normal affect [place].AT,PERRAL Supple Neck, No JVD,   Symmetrical Chest wall movement, Good air movement bilaterally, CTAB RRR,No Gallops, Rubs or new Murmurs,  +ve B.Sounds, Abd Soft, No tenderness,   No Cyanosis, Clubbing or edema    Pertinent Labs/Radiology: CBC Latest Ref Rng & Units 09/30/2021 09/29/2021 07/12/2021  WBC 4.0 - 10.5 K/uL 7.4 7.0 7.5  Hemoglobin 12.0 - 15.0 g/dL 12.1 12.9 13.3  Hematocrit 36.0 - 46.0 % 35.5(L) 37.7 39.9  Platelets 150 - 400 K/uL 176 199 203    Lab Results  Component Value Date   NA 137 10/01/2021   K 3.6  10/01/2021   CL 108 10/01/2021   CO2 22 10/01/2021     Assessment/Plan: * Seizure ([place]) Getting a long-term EEG, spot EEG unremarkable Case discussed with neurology defer management to them, Vimpat on hold with as needed IV Ativan on board.  Seems to be seizure-free for now.  Continue monitoring closely.  Cannabis abuse-Urine drug screen +ve on 1/26 Counseled-Per patient-she consumes edibles.  Hypokalemia Replete and recheck.  Essential (primary) hypertension- (present on admission) BP stable-continue Coreg/losartan.  Adjustment disorder with mixed anxiety and depressed mood- (present on admission) Follows with therapist-not on any medications.  Alcohol dependence in remission ([place]) In remission for 8 years-goes to [employer] meetings.  No signs of withdrawal symptoms.   Morbid Obesity: Estimated body mass index is 40.61 kg/m as calculated from the following:   Height as of this encounter: 5\' 3"  (1.6 m).   Weight as of this encounter: 104 kg.    DVT Prophylaxis: SQ Lovenox Procedures: None Consults: Neurology Code Status:Full code  Family Communication: None at bedside   Disposition Plan: Status is: Inpt  Diet: Diet Order             Diet regular Room service appropriate? Yes; Fluid consistency: Thin  Diet effective now                     Antimicrobial agents: Anti-infectives (From admission, onward)    None        MEDICATIONS: Scheduled Meds:  carvedilol  12.5 mg Oral BID   enoxaparin (LOVENOX) injection  50 mg Subcutaneous Q24H   losartan  50 mg Oral Daily   potassium chloride  40 mEq Oral Once   sodium chloride flush  3 mL Intravenous Q12H   Continuous Infusions: PRN Meds:.acetaminophen **OR** acetaminophen, ibuprofen, polyethylene glycol   I have personally reviewed following labs and imaging studies  LABORATORY DATA:  Recent Labs  Lab 09/29/21 1152 09/30/21 0136  WBC 7.0 7.4  HGB 12.9 12.1  HCT 37.7 35.5*  PLT 199 176  MCV 81.6  82.2  MCH 27.9 28.0  MCHC 34.2 34.1  RDW 14.0 13.5    Recent Labs  Lab 09/29/21 1152 09/30/21 0136 10/01/21 0105  NA 138 136 137  K 4.2 3.4* 3.6  CL 107 104 108  CO2 23 23 22   GLUCOSE 121* 112* 119*  BUN 13 11 10   CREATININE 0.79 0.79 0.75  CALCIUM 8.9 8.7* 8.9  AST 33 24  --   ALT 46* 38  --   ALKPHOS 92 84  --   BILITOT 0.7 0.6  --   ALBUMIN 4.1 3.7  --   MG 1.8  --  1.9  AMMONIA 22  --   --     RADIOLOGY STUDIES/RESULTS: CT Head Wo Contrast  Result Date: 09/29/2021 CLINICAL DATA:  Altered mental status, nontraumatic. Headache. Patient feels "out of it." EXAM: CT HEAD WITHOUT CONTRAST TECHNIQUE: Contiguous axial images were obtained from the base of the skull through the vertex without intravenous contrast. RADIATION DOSE REDUCTION: This exam was performed according to the departmental dose-optimization program which includes automated exposure control, adjustment of the mA and/or kV according to patient size and/or use of iterative reconstruction technique. COMPARISON:  CT brain 08/05/2021 FINDINGS: Brain: The ventricles are normal in size and configuration. The basilar cisterns are patent. No mass, mass effect, or midline shift. No acute intracranial hemorrhage is seen. No abnormal extra-axial fluid collection. Preservation of the normal cortical gray-white interface without CT evidence of an acute major vascular territorial cortical based infarction. Vascular: No hyperdense vessel or unexpected calcification. Skull: Normal. Negative for fracture or focal lesion. Sinuses/Orbits: The visualized orbits are unremarkable. The visualized paranasal sinuses and mastoid air cells are clear. Other: None. Electronically Signed   By: Yvonne Kendall M.D.   On: 09/29/2021 14:16   Overnight EEG with video  Result Date: 09/30/2021 Lora Havens, MD     10/01/2021  8:56 AM Patient Name: Meredith Park MRN: [id number] Epilepsy Attending: Lora Havens Referring Physician/Provider:  Greta Doom, MD Duration: 09/30/2021 0253 to 10/01/2021 0253 Patient history:  51 year old female with recurrent episodes of confusional states most likely representing right temporal seizures.  EEG to evaluate for seizure. Level of alertness: Awake, asleep AEDs during EEG study: LCM Technical aspects: This EEG study was done with scalp electrodes positioned according to the 10-20 International system of electrode placement. Electrical activity was acquired at a sampling rate of 500Hz  and reviewed with a high frequency filter of 70Hz  and a low frequency filter of 1Hz . EEG data were recorded continuously and digitally stored. Description: The posterior dominant rhythm consists of 9 Hz activity of moderate voltage (25-35 uV) seen predominantly in posterior head regions, symmetric and reactive to eye opening and eye closing. Sleep was characterized by vertex waves, sleep spindles (12 to 14 Hz), maximal frontocentral region. Physiologic photic driving was seen during photic stimulation.  No EEG change was seen during hyperventilation   IMPRESSION: This study is within normal limits. No seizures or epileptiform discharges were seen  throughout the recording. Priyanka Barbra Sarks     LOS: 0 days   Signature  Lala Lund M.D on 10/01/2021 at 11:22 AM   -  To page go to [email or web address]

## 2021-10-01 NOTE — Evaluation (Signed)
Speech Language Pathology Evaluation Patient Details Name: Remiyah Duey MRN: [id number] DOB: 1971/08/02 Today's Date: 10/01/2021 Time: 1730-1755 SLP Time Calculation (min) (ACUTE ONLY): 25 min  Problem List:  Patient Active Problem List   Diagnosis Date Noted   Hypokalemia 09/30/2021   Cannabis abuse-Urine drug screen +ve on 1/26 09/30/2021   Seizure ([place]) 09/29/2021   Alcohol dependence in remission ([place]) 04/12/2018   Adjustment disorder with mixed anxiety and depressed mood 08/26/2017   Essential (primary) hypertension 08/26/2017   Mixed hyperlipidemia 08/26/2017   Past Medical History:  Past Medical History:  Diagnosis Date   Alcohol abuse    Alcohol withdrawal ([place]) 08/28/2018   High cholesterol    Hypertension    Seizures ([place])    Past Surgical History: History reviewed. No pertinent surgical history. HPI:  Kimaria Defrance is a 51 y.o female who presented to the hospital on 1/26 with episodes of confusion/staring-concern for breakthrough seizures. MRI brain: Chronic small vessel disease-incidental calcified pineal gland region cyst; CT negative. PMHx: ETOH abuse (reports sober 2y 74m), HLD and HTN   Assessment / Plan / Recommendation Clinical Impression  Patient participated in completing the SLUMS ([place] Mental Status exam) and received a score of 26, placing her at the top of range for Mild Neurocognitive Disorder (scores 21-26). Her errors were all in sections dealing with delayed recall. She did not exhibit any specific word finding errors but twice she forgot what the topic was that we were discussing during informal conversation. 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 She did report that her coworkers had noticed these cognitive changes as well her therapist (part of her recovery via Alcoholics Anonymous) had noticed she was not able to maintain attention or adequately participate all of the time. SLP is recommending OP SLP services which patient is in agreement with. She will benefit from short term acute care level services however SLP did inform her that if she is to discharge in next 1-2 days, she might not get seen by ST services.    SLP Assessment  SLP Recommendation/Assessment: Patient needs continued Speech [facility] SLP Visit Diagnosis: Cognitive communication deficit (R41.841)    Recommendations for follow up therapy are one component of a multi-disciplinary discharge planning process, led by the attending physician.  Recommendations may be updated based on patient status, additional functional criteria and insurance authorization.    Follow Up Recommendations  Outpatient SLP    Assistance Recommended at Discharge  PRN  Functional Status Assessment Patient has had a recent decline in their functional status and demonstrates the ability to make significant improvements in function in a reasonable and predictable amount of time.  Frequency and Duration min 1 x/week  1 week      SLP Evaluation Cognition  Overall Cognitive Status: Impaired/Different from baseline Arousal/Alertness: Awake/alert Orientation Level: Oriented X4 Year: 2023 Month: January Day of Week: Correct Attention: Alternating Sustained Attention: Appears intact Alternating Attention: Impaired Alternating Attention Impairment: Verbal complex Memory: Impaired Memory Impairment: Retrieval deficit;Other (comment) (recalled 3 of 5 words after 2 minute delay) Awareness: Appears intact Problem Solving: Appears intact Behaviors: Other (comment) Safety/Judgment: Appears  intact       Comprehension   Auditory Comprehension Overall Auditory Comprehension: Appears within functional limits for tasks assessed    Expression Expression Primary Mode of Expression: Verbal Verbal Expression Overall Verbal Expression: Appears within functional limits for tasks assessed Initiation: No impairment Repetition: No impairment Naming: No impairment Pragmatics: No impairment Interfering Components: Attention Other Verbal Expression Comments: Patient reports some word finding issues and forgetting topic during conversation. This was observed by SLP twice during conversation Written Expression Dominant Hand: Right   Oral / Motor  Oral Motor/Sensory Function Overall Oral Motor/Sensory Function: Within functional limits Motor Speech Overall Motor Speech: Appears within functional limits for tasks assessed Respiration: Within functional limits Resonance: Within functional limits Articulation: Within functional limitis Intelligibility: Intelligible Motor Planning: Witnin functional limits Motor Speech Errors: Not applicable           Sonia Baller, MA, CCC-SLP Speech Therapy

## 2021-10-01 NOTE — Progress Notes (Signed)
LTM maint complete - no skin breakdown under: Fp1 Fp2 F8 Reapplied Fp1 F8 [facility] monitored, Event button test confirmed by [facility].

## 2021-10-01 NOTE — Procedures (Addendum)
Patient Name: Meredith Park  MRN: [id number]  Epilepsy Attending: Charlsie Quest  Referring Physician/Provider: Rejeana Brock, MD Duration: 10/01/2021 0253 to 10/02/2021 0253   Patient history:  51 year old female with recurrent episodes of confusional states most likely representing right temporal seizures.  EEG to evaluate for seizure.   Level of alertness: Awake, asleep   AEDs during EEG study: None   Technical aspects: This EEG study was done with scalp electrodes positioned according to the 10-20 International system of electrode placement. Electrical activity was acquired at a sampling rate of 500Hz  and reviewed with a high frequency filter of 70Hz  and a low frequency filter of 1Hz . EEG data were recorded continuously and digitally stored.    Description: The posterior dominant rhythm consists of 9 Hz activity of moderate voltage (25-35 uV) seen predominantly in posterior head regions, symmetric and reactive to eye opening and eye closing. Sleep was characterized by vertex waves, sleep spindles (12 to 14 Hz), maximal frontocentral region.  Physiologic photic driving was seen during photic stimulation.  No EEG change was seen during hyperventilation      Parts of study were difficult to interpret due to significant electrode artifact.  IMPRESSION: This study is within normal limits. No seizures or epileptiform discharges were seen throughout the recording.   Kwinton Maahs 

## 2021-10-01 NOTE — Progress Notes (Signed)
LTM maintain at bedside. No skin breakdown noted. Results pending.  °

## 2021-10-01 NOTE — Evaluation (Signed)
Physical Therapy Evaluation Patient Details Name: Meredith Park MRN: [id number] DOB: 08/12/1971 Today's Date: 10/01/2021  History of Present Illness  Meredith Park is a 51 y.o female who presented to the hospital on 1/26 with episodes of confusion/staring-concern for breakthrough seizures. MRI brain: Chronic small vessel disease-incidental calcified pineal gland region cyst; CT negative. PMHx: ETOH abuse (reports sober 2y 67m), HLD and HTN  Clinical Impression  Pt admitted with/for problems stated above.  Pt generally at a mod I level in a supervised environment..  Pt currently limited functionally due to the problems listed below.  (see problems list.)  Pt will benefit from PT to maximize function and safety to be able to get home safely with available assist.        Recommendations for follow up therapy are one component of a multi-disciplinary discharge planning process, led by the attending physician.  Recommendations may be updated based on patient status, additional functional criteria and insurance authorization.  Follow Up Recommendations No PT follow up    Assistance Recommended at Discharge Intermittent Supervision/Assistance  Patient can return home with the following  Assist for transportation    Equipment Recommendations None recommended by PT  Recommendations for Other Services       Functional Status Assessment Patient has had a recent decline in their functional status and demonstrates the ability to make significant improvements in function in a reasonable and predictable amount of time.     Precautions / Restrictions Precautions Precautions: Fall Precaution Comments: seizure      Mobility  Bed Mobility Overal bed mobility: Modified Independent                  Transfers Overall transfer level: Modified independent Equipment used: None               General transfer comment: no AD, no LOB    Ambulation/Gait Ambulation/Gait  assistance: Modified independent (Device/Increase time) Gait Distance (Feet): 60 Feet (within limitations placed with EEG lines) Assistive device: None Gait Pattern/deviations: Step-through pattern   Gait velocity interpretation: 1.31 - 2.62 ft/sec, indicative of limited community ambulator   General Gait Details: steady with abrupt turns, backing up, stepping over obstacles  Stairs            Wheelchair Mobility    Modified Rankin (Stroke Patients Only)       Balance Overall balance assessment: Needs assistance Sitting-balance support: Feet supported Sitting balance-Leahy Scale: Good     Standing balance support: No upper extremity supported Standing balance-Leahy Scale: Good                               Pertinent Vitals/Pain Pain Assessment Pain Assessment: No/denies pain    Home Living Family/patient expects to be discharged to:: Private residence Living Arrangements: Alone Available Help at Discharge: Family;Available PRN/intermittently Type of Home: House Home Access: Stairs to enter     Alternate Level Stairs-Number of Steps: flight Home Layout: Two level;Bed/bath upstairs Home Equipment: None Additional Comments: pt's daughter visits daily, her mom lives close by and they spend a lot of time together    Prior Function Prior Level of Function : Independent/Modified Independent;Working/employed             Mobility Comments: indep, no AD ADLs Comments: pt has been sober for almost 3 years. indep. works from home. does not drive (2/2 seizures)     Hand Dominance   Dominant Hand: Right  Extremity/Trunk Assessment   Upper Extremity Assessment Upper Extremity Assessment: Overall WFL for tasks assessed    Lower Extremity Assessment Lower Extremity Assessment: Overall WFL for tasks assessed    Cervical / Trunk Assessment Cervical / Trunk Assessment: Normal  Communication   Communication: No difficulties  Cognition  Arousal/Alertness: Awake/alert Behavior During Therapy: WFL for tasks assessed/performed Overall Cognitive Status: Impaired/Different from baseline Area of Impairment: Memory                     Memory: Decreased short-term memory         General Comments: Word finding difficulties noted. Pt changing subject during conversations 2/2 forgetting what question was asked. Reports new STM difficulties.        General Comments General comments (skin integrity, edema, etc.): vss on RA    Exercises     Assessment/Plan    PT Assessment Patient needs continued PT services  PT Problem List Decreased mobility;Other (comment) (dec STM and word finding)       PT Treatment Interventions Gait training;Stair training;Therapeutic activities;Patient/family education    PT Goals (Current goals can be found in the Care Plan section)  Acute Rehab PT Goals Patient Stated Goal: back to work PT Goal Formulation: With patient Time For Goal Achievement: 10/07/21 Potential to Achieve Goals: Good    Frequency Min 2X/week     Co-evaluation               AM-PAC PT "6 Clicks" Mobility  Outcome Measure Help needed turning from your back to your side while in a flat bed without using bedrails?: None Help needed moving from lying on your back to sitting on the side of a flat bed without using bedrails?: None Help needed moving to and from a bed to a chair (including a wheelchair)?: None Help needed standing up from a chair using your arms (e.g., wheelchair or bedside chair)?: None Help needed to walk in hospital room?: None Help needed climbing 3-5 steps with a railing? : None 6 Click Score: 24    End of Session   Activity Tolerance: Patient tolerated treatment well Patient left: in bed;with call bell/phone within reach;with family/visitor present Nurse Communication: Mobility status PT Visit Diagnosis: Other abnormalities of gait and mobility (R26.89)    Time: 1550-1611 PT  Time Calculation (min) (ACUTE ONLY): 21 min   Charges:   PT Evaluation $PT Eval Moderate Complexity: 1 Mod          10/01/2021  Jacinto Halim., PT Acute Rehabilitation Services [phone]  (pager) [phone]  (office)  [name] 10/01/2021, 4:23 PM

## 2021-10-01 NOTE — Evaluation (Addendum)
Occupational Therapy Evaluation Patient Details Name: Meredith Park MRN: [id number] DOB: [birth year] Today's Date: 10/01/2021   History of Present Illness Meredith Park is a 51 y.o female who presented to the hospital on 1/26 with episodes of confusion/staring-concern for breakthrough seizures. MRI brain: Chronic small vessel disease-incidental calcified pineal gland region cyst; CT negative. PMHx: ETOH abuse (reports sober 2y 56m), HLD and HTN   Clinical Impression   [name] reports being indep PTA, she works from home but does not drive (2/2 seizures). She lives alone in a 2 story home, her daughter visits her often and her mother lives close by. Upon evaluation pt demonstrated supervision level mobility and ADLs, limited by EEG this date. Pt was noted with word finding difficulties throughout and STM deficits. She will benefit from OT acutely to address deficits listed below. Recommend pt d/c home with increased supervision from family, no OT follow up.   Pt would also benefit from a SLP cognitive evaluation to address STM and word-finding impairments to ensure safe transition to the home environment.     Recommendations for follow up therapy are one component of a multi-disciplinary discharge planning process, led by the attending physician.  Recommendations may be updated based on patient status, additional functional criteria and insurance authorization.   Follow Up Recommendations  No OT follow up    Assistance Recommended at Discharge Intermittent Supervision/Assistance  Patient can return home with the following Direct supervision/assist for medications management;Direct supervision/assist for financial management;Assist for transportation;Assistance with cooking/housework    Functional Status Assessment  Patient has had a recent decline in their functional status and demonstrates the ability to make significant improvements in function in a reasonable and predictable amount  of time.  Equipment Recommendations  None recommended by OT    Recommendations for Other Services Speech consult (cognitive eval.)     Precautions / Restrictions Precautions Precautions: Fall Precaution Comments: seizure Restrictions Weight Bearing Restrictions: No      Mobility Bed Mobility Overal bed mobility: Modified Independent                  Transfers Overall transfer level: Modified independent Equipment used: None               General transfer comment: no AD, no LOB      Balance Overall balance assessment: Needs assistance Sitting-balance support: Feet supported Sitting balance-Leahy Scale: Good     Standing balance support: No upper extremity supported Standing balance-Leahy Scale: Good Standing balance comment: limited to static standing this session                           ADL either performed or assessed with clinical judgement   ADL       General ADL Comments: pt demonstrated supervision level ADLs, no AD. pt limited this session due to EEG attachments, however overall WFL for tasks assessed. Plan to assess further after EEG is removed with a cognitive/IADL focus.     Vision Baseline Vision/History: 0 No visual deficits Ability to See in Adequate Light: 0 Adequate Vision Assessment?: No apparent visual deficits            Pertinent Vitals/Pain Pain Assessment Pain Assessment: No/denies pain     Hand Dominance Right   Extremity/Trunk Assessment Upper Extremity Assessment Upper Extremity Assessment: Overall WFL for tasks assessed   Lower Extremity Assessment Lower Extremity Assessment: Overall WFL for tasks assessed   Cervical / Trunk Assessment Cervical /  Trunk Assessment: Normal   Communication Communication Communication: No difficulties   Cognition Arousal/Alertness: Awake/alert Behavior During Therapy: WFL for tasks assessed/performed Overall Cognitive Status: Impaired/Different from baseline Area  of Impairment: Memory                     Memory: Decreased short-term memory         General Comments: Word finding difficulties noted. Pt changing subject during conversations 2/2 forgetting what question was asked. Reports new STM difficulties.     General Comments  VSS on RA     Home Living Family/patient expects to be discharged to:: Private residence Living Arrangements: Alone Available Help at Discharge: Family;Available PRN/intermittently Type of Home: House Home Access: Stairs to enter     Home Layout: Two level;Bed/bath upstairs Alternate Level Stairs-Number of Steps: flight   Bathroom Shower/Tub: Producer, television/film/video: Standard     Home Equipment: None   Additional Comments: pt's daughter visits daily, her mom lives close by and they spend a lot of time together      Prior Functioning/Environment Prior Level of Function : Independent/Modified Independent;Working/employed             Mobility Comments: indep, no AD ADLs Comments: pt has been sober for almost 3 years. indep. works from home. does not drive (2/2 seizures)        OT Problem List: Decreased cognition;Decreased safety awareness;Decreased knowledge of precautions      OT Treatment/Interventions: Self-care/ADL training;Therapeutic activities;Cognitive remediation/compensation    OT Goals(Current goals can be found in the care plan section) Acute Rehab OT Goals Patient Stated Goal: home soon OT Goal Formulation: With patient Time For Goal Achievement: 10/15/21 Potential to Achieve Goals: Good ADL Goals Pt Will Transfer to Toilet: Independently;ambulating Pt Will Perform Tub/Shower Transfer: ambulating;Shower transfer;Independently Additional ADL Goal #1: pt will indep complete a medication management IADL task Additional ADL Goal #2: pt will indep verbalize at least 3 compensatory techniques for STM to apply in the home setting  OT Frequency: Min 2X/week        AM-PAC OT "6 Clicks" Daily Activity     Outcome Measure Help from another person eating meals?: None Help from another person taking care of personal grooming?: None Help from another person toileting, which includes using toliet, bedpan, or urinal?: A Little Help from another person bathing (including washing, rinsing, drying)?: A Little Help from another person to put on and taking off regular upper body clothing?: None Help from another person to put on and taking off regular lower body clothing?: None 6 Click Score: 22   End of Session Nurse Communication: Mobility status  Activity Tolerance: Patient tolerated treatment well Patient left: in bed;with call bell/phone within reach;with bed alarm set  OT Visit Diagnosis: Other abnormalities of gait and mobility (R26.89);Muscle weakness (generalized) (M62.81)                Time: 3762-8315 OT Time Calculation (min): 20 min Charges:  OT General Charges $OT Visit: 1 Visit OT Evaluation $OT Eval Moderate Complexity: 1 Mod   Ahrianna Siglin A Rocsi Hazelbaker 10/01/2021, 1:27 PM

## 2021-10-02 LAB — CBC WITH DIFFERENTIAL/PLATELET
Abs Immature Granulocytes: 0.03 10*3/uL (ref 0.00–0.07)
Basophils Absolute: 0 10*3/uL (ref 0.0–0.1)
Basophils Relative: 0 %
Eosinophils Absolute: 0.2 10*3/uL (ref 0.0–0.5)
Eosinophils Relative: 2 %
HCT: 38 % (ref 36.0–46.0)
Hemoglobin: 13.1 g/dL (ref 12.0–15.0)
Immature Granulocytes: 0 %
Lymphocytes Relative: 26 %
Lymphs Abs: 1.8 10*3/uL (ref 0.7–4.0)
MCH: 28.2 pg (ref 26.0–34.0)
MCHC: 34.5 g/dL (ref 30.0–36.0)
MCV: 81.9 fL (ref 80.0–100.0)
Monocytes Absolute: 0.4 10*3/uL (ref 0.1–1.0)
Monocytes Relative: 6 %
Neutro Abs: 4.6 10*3/uL (ref 1.7–7.7)
Neutrophils Relative %: 66 %
Platelets: 192 10*3/uL (ref 150–400)
RBC: 4.64 MIL/uL (ref 3.87–5.11)
RDW: 13.8 % (ref 11.5–15.5)
WBC: 7 10*3/uL (ref 4.0–10.5)
nRBC: 0 % (ref 0.0–0.2)

## 2021-10-02 LAB — COMPREHENSIVE METABOLIC PANEL
ALT: 30 U/L (ref 0–44)
AST: 19 U/L (ref 15–41)
Albumin: 3.8 g/dL (ref 3.5–5.0)
Alkaline Phosphatase: 89 U/L (ref 38–126)
Anion gap: 10 (ref 5–15)
BUN: 11 mg/dL (ref 6–20)
CO2: 20 mmol/L — ABNORMAL LOW (ref 22–32)
Calcium: 8.9 mg/dL (ref 8.9–10.3)
Chloride: 107 mmol/L (ref 98–111)
Creatinine, Ser: 0.81 mg/dL (ref 0.44–1.00)
GFR, Estimated: >60 mL/min (ref >60)
Glucose, Bld: 103 mg/dL — ABNORMAL HIGH (ref 70–99)
Potassium: 4 mmol/L (ref 3.5–5.1)
Sodium: 137 mmol/L (ref 135–145)
Total Bilirubin: 0.7 mg/dL (ref 0.3–1.2)
Total Protein: 6.3 g/dL — ABNORMAL LOW (ref 6.5–8.1)

## 2021-10-02 LAB — MAGNESIUM: Magnesium: 1.9 mg/dL (ref 1.7–2.4)

## 2021-10-02 LAB — GLUCOSE, CAPILLARY
Glucose-Capillary: 120 mg/dL — ABNORMAL HIGH (ref 70–99)
Glucose-Capillary: 95 mg/dL (ref 70–99)
Glucose-Capillary: 171 mg/dL — ABNORMAL HIGH (ref 70–99)

## 2021-10-02 MED ORDER — LORAZEPAM 2 MG/ML IJ SOLN: 1.0000 mg | INTRAMUSCULAR | Status: DC | PRN

## 2021-10-02 NOTE — Progress Notes (Signed)
Neurology Progress Note  Brief HPI: 51 year old patient with history of ETOH abuse (sober for two years), HLD, HTN, seizures and adjustment disorder presents after having three seizure-like episodes at home.  During the first episode, she was working from home and at a meeting and was told that she was acting strangely and was unable to answer questions.  She had the second episode at her therapist's office and was noted to be staring off into space.  She also reports leading her AA meeting, looking at the clock and realizing it was almost time for the meeting to start, then having no memory of events for the next 35 minutes.  Others in the meeting stated that during that time she was acting strangely and no comprehending things that were said to her.  She denied having any tics or abnormal movements like lipsmacking during these times.  Patient then went to the ED and had another episode without tonic-clonic activity but with some confusion afterwards.  Patient states that she often notices an odd smell or taste prior to seizures and that her daughter tells her that she will sometimes "stop" for 10-20 seconds and not respond to stimuli.  No tics occur during these times.  She states that she often has a bad headache after seizures.  Subjective: Patient reports that she is tired of being connected to monitors but is willing to continue EEG monitoring for another 24 hours to further characterize these seizures.  Patient reports that she has been under a significant amount of stress recently with family-related stressors. She states that these seizure like events did not start until she became sober. She states that when she has seizure events she has an aura of a strange smell and often states "I'm hot" or "I don't feel good" prior to seizure activity or just has loud vocalizations prior to larger seizure-like spells. She endorses some automatisms with lip smacking at times during other seizure-like events. She  also states that her personality has recently changed since she became sober where she used to be an introvert and now she is very extroverted.   Exam: Vitals:   10/02/21 0400 10/02/21 0840  BP: 109/67 (!) 111/92  Pulse: 66 66  Resp: 17 16  Temp: 97.6 F (36.4 C) 98.2 F (36.8 C)  SpO2: 90% 94%   Gen: Laying comfortably in bed, in no acute distress Resp: non-labored breathing, no respiratory distress  Neuro: Mental Status: alert and oriented x3, speech fluent, naming intact. Speech is intact without dysarthria.  Patient does complain about some word finding difficulties intermittently but there is no obvious word-finding difficulties during examination.  Motor: moves all extremities spontaneously with antigravity movement. There is no noted asymmetry.  Sensory: Intact to light touch and symmetrical Gait: Deferred  Pertinent Labs: CBC    Component Value Date/Time   WBC 7.0 10/02/2021 0317   RBC 4.64 10/02/2021 0317   HGB 13.1 10/02/2021 0317   HCT 38.0 10/02/2021 0317   PLT 192 10/02/2021 0317   MCV 81.9 10/02/2021 0317   MCH 28.2 10/02/2021 0317   MCHC 34.5 10/02/2021 0317   RDW 13.8 10/02/2021 0317   LYMPHSABS 1.8 10/02/2021 0317   MONOABS 0.4 10/02/2021 0317   EOSABS 0.2 10/02/2021 0317   BASOSABS 0.0 10/02/2021 0317    BMP Latest Ref Rng & Units 10/02/2021 10/01/2021 09/30/2021  Glucose 70 - 99 mg/dL 956(L) 875(I) 433(I)  BUN 6 - 20 mg/dL 11 10 11   Creatinine 0.44 - 1.00  mg/dL 8.12 7.51 7.00  Sodium 135 - 145 mmol/L 137 137 136  Potassium 3.5 - 5.1 mmol/L 4.0 3.6 3.4(L)  Chloride 98 - 111 mmol/L 107 108 104  CO2 22 - 32 mmol/L 20(L) 22 23  Calcium 8.9 - 10.3 mg/dL 8.9 8.9 1.7(C)    Imaging Reviewed: CT head 1/26 with no acute abnormalities  LTM EEG 1/28 - 1/29: IMPRESSION: This study is within normal limits. No seizures or epileptiform discharges were seen throughout the recording. EEG from 08/23/2021 revealed right temporal sharps and intermittent right  temporal slowing.  Assessment: 51 yo patient with history of ETOH abuse, HLD and HTN presents with multiple breakthrough seizure episodes at home. LTM EEG has shown no seizure activity so far, so will continue to hold Vimpat and continue LTM EEG monitoring for 24 hours longer in an attempt to observe and characterize seizure episodes.  Recommendations: 1) Continue to hold Vimpat 2) Continue LTM EEG for 24 hours longer to further characterize seizure episodes 3) Ativan 2 mg IV for seizure activity >5 minutes 4) Continue seizure precautions  Dr Melynda Ripple to follow Monday.   Discussed [facility] statutes, patients with seizures are not allowed to drive until they have been seizure-free for six months Use caution when using heavy equipment or power tools. Avoid working on ladders or at heights. Take showers instead of baths. Ensure the water temperature is not too high on the home water heater. Do not go swimming alone. Do not lock yourself in a room alone (i.e. bathroom). When caring for infants or small children, sit down when holding, feeding, or changing them to minimize risk of injury to the child in the event you have a seizure. Maintain good sleep hygiene. Avoid alcohol.    Kara Mead , MSN, AGACNP-BC [facility] [phone] 10/02/2021 9:22 AM   Attending Neurohospitalist Addendum Patient seen and examined with APP/Resident. Agree with the history and physical as documented above. Agree with the plan as documented, which I helped formulate. I have independently reviewed the chart, obtained history, review of systems and examined the patient.I have personally reviewed pertinent head/neck/spine imaging (CT/MRI). Please feel free to call with any questions.  -- Milon Dikes, MD Neurologist [facility] Pager: [phone]

## 2021-10-02 NOTE — Progress Notes (Signed)
Ltm maintained at bedside. Monitor impedance screen was locked on after restart. And started under child montage instead of adult. No skin break down noted. Photic and HV performed. Results pending.

## 2021-10-02 NOTE — Procedures (Addendum)
Patient Name: Meredith Park  MRN: [id number]  Epilepsy Attending: Charlsie Quest  Referring Physician/Provider: Rejeana Brock, MD Duration: 10/02/2021 0253 to 10/03/2021 0253   Patient history:  51 year old female with recurrent episodes of confusional states most likely representing right temporal seizures.  EEG to evaluate for seizure.   Level of alertness: Awake, asleep   AEDs during EEG study: None   Technical aspects: This EEG study was done with scalp electrodes positioned according to the 10-20 International system of electrode placement. Electrical activity was acquired at a sampling rate of 500Hz  and reviewed with a high frequency filter of 70Hz  and a low frequency filter of 1Hz . EEG data were recorded continuously and digitally stored.    Description: The posterior dominant rhythm consists of 9 Hz activity of moderate voltage (25-35 uV) seen predominantly in posterior head regions, symmetric and reactive to eye opening and eye closing. Sleep was characterized by vertex waves, sleep spindles (12 to 14 Hz), maximal frontocentral region. Small sharp spikes ( SSS) were noted in right frontotemporal region. Physiologic photic driving was seen during photic stimulation.  No EEG change was seen during hyperventilation     IMPRESSION: This study is within normal limits. No seizures or epileptiform discharges were seen throughout the recording.   Evani Shrider 

## 2021-10-02 NOTE — Progress Notes (Signed)
PROGRESS NOTE        PATIENT DETAILS Name: Yamilee Harmes Age: 51 y.o. Sex: female Date of Birth: 23-Apr-1971 Admit Date: 09/29/2021 Admitting Physician Leroy Sea, MD OMV:[id number], [place], [place]  Brief Summary: 51 year old female with history of seizures related to alcohol use-recently evaluated by outpatient neurology with EEG/MRI brain-and subsequently started on Keppra which was switched to Vimpat (due to side effects)-presented to the hospital on 1/26 with episodes of confusion/staring-concern for breakthrough seizures.    Significant events: 1/26>> admit to hospitalist service-for suspicion of breakthrough seizures.  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  No acute abnormalities. 09/29/2021>> CT head: No acute intracranial abnormalities.  Microbiology data: 1/26>> COVID/flu PCR: Negative  Subjective:  Patient in bed, appears comfortable, denies any headache, no fever, no chest pain or pressure, no shortness of breath , no abdominal pain. No new focal weakness.    Objective: Vitals: Blood pressure (!) 111/92, pulse 66, temperature 98.2 F (36.8 C), temperature source Oral, resp. rate 16, height 5\' 3"  (1.6 m), weight 104 kg, last menstrual period 08/28/2020, SpO2 94 %.   Exam:  Awake Alert, No new F.N deficits, Normal affect [place].AT,PERRAL Supple Neck, No JVD,   Symmetrical Chest wall movement, Good air movement bilaterally, CTAB RRR,No Gallops, Rubs or new Murmurs,  +ve B.Sounds, Abd Soft, No tenderness,   No Cyanosis, Clubbing or edema    Assessment/Plan: * Seizure (HCC) Getting a long-term EEG, spot EEG unremarkable Case discussed with neurology defer management to them, Vimpat on hold with as needed IV Ativan on board.  Currently seems to be seizure-free Case discussed with neurologist on 10/02/2021 continue to monitor for another 24 hours  in hopes of capturing an event on EEG  Cannabis abuse-Urine drug screen +ve on 1/26 Counseled-Per patient-she consumes edibles.  Hypokalemia Replete and recheck.  Essential (primary) hypertension- (present on admission) BP stable-continue Coreg/losartan.  Adjustment disorder with mixed anxiety and depressed mood- (present on admission) Follows with therapist-not on any medications.  Alcohol dependence in remission (HCC) In remission for 8 years-goes to AA meetings.  No signs of withdrawal symptoms.   Morbid Obesity: Estimated body mass index is 40.61 kg/m as calculated from the following:   Height as of this encounter: 5\' 3"  (1.6 m).   Weight as of this encounter: 104 kg.    DVT Prophylaxis: SQ Lovenox Procedures: None Consults: Neurology Code Status:Full code  Family Communication: None at bedside   Disposition Plan: Status is: Inpt  Diet: Diet Order             Diet regular Room service appropriate? Yes; Fluid consistency: Thin  Diet effective now                     Antimicrobial agents: Anti-infectives (From admission, onward)    None        MEDICATIONS: Scheduled Meds:  carvedilol  12.5 mg Oral BID   enoxaparin (LOVENOX) injection  50 mg Subcutaneous Q24H   losartan  50 mg Oral Daily   sodium chloride flush  3 mL Intravenous Q12H   Continuous Infusions: PRN Meds:.acetaminophen **OR** acetaminophen, ibuprofen, polyethylene glycol   I have personally reviewed following labs and imaging studies  LABORATORY DATA:  Recent Labs  Lab 09/29/21 1152 09/30/21 0136 10/02/21 0317  WBC 7.0 7.4  7.0  HGB 12.9 12.1 13.1  HCT 37.7 35.5* 38.0  PLT 199 176 192  MCV 81.6 82.2 81.9  MCH 27.9 28.0 28.2  MCHC 34.2 34.1 34.5  RDW 14.0 13.5 13.8  LYMPHSABS  --   --  1.8  MONOABS  --   --  0.4  EOSABS  --   --  0.2  BASOSABS  --   --  0.0    Recent Labs  Lab 09/29/21 1152 09/30/21 0136 10/01/21 0105 10/02/21 0317  NA 138 136 137 137  K 4.2  3.4* 3.6 4.0  CL 107 104 108 107  CO2 23 23 22  20*  GLUCOSE 121* 112* 119* 103*  BUN 13 11 10 11   CREATININE 0.79 0.79 0.75 0.81  CALCIUM 8.9 8.7* 8.9 8.9  AST 33 24  --  19  ALT 46* 38  --  30  ALKPHOS 92 84  --  89  BILITOT 0.7 0.6  --  0.7  ALBUMIN 4.1 3.7  --  3.8  MG 1.8  --  1.9 1.9  AMMONIA 22  --   --   --     RADIOLOGY STUDIES/RESULTS: No results found.   LOS: 1 day   Signature  M.D on 10/02/2021 at 10:06 AM   -  To page go to [email or web address]

## 2021-10-03 ENCOUNTER — Other Ambulatory Visit (HOSPITAL_COMMUNITY): Payer: Self-pay

## 2021-10-03 LAB — CBC WITH DIFFERENTIAL/PLATELET
Abs Immature Granulocytes: 0.02 10*3/uL (ref 0.00–0.07)
Basophils Absolute: 0 10*3/uL (ref 0.0–0.1)
Basophils Relative: 0 %
Eosinophils Absolute: 0.2 10*3/uL (ref 0.0–0.5)
Eosinophils Relative: 2 %
HCT: 40.2 % (ref 36.0–46.0)
Hemoglobin: 13.4 g/dL (ref 12.0–15.0)
Immature Granulocytes: 0 %
Lymphocytes Relative: 24 %
Lymphs Abs: 2 10*3/uL (ref 0.7–4.0)
MCH: 27.9 pg (ref 26.0–34.0)
MCHC: 33.3 g/dL (ref 30.0–36.0)
MCV: 83.8 fL (ref 80.0–100.0)
Monocytes Absolute: 0.5 10*3/uL (ref 0.1–1.0)
Monocytes Relative: 7 %
Neutro Abs: 5.4 10*3/uL (ref 1.7–7.7)
Neutrophils Relative %: 67 %
Platelets: 206 10*3/uL (ref 150–400)
RBC: 4.8 MIL/uL (ref 3.87–5.11)
RDW: 13.9 % (ref 11.5–15.5)
WBC: 8.1 10*3/uL (ref 4.0–10.5)
nRBC: 0 % (ref 0.0–0.2)

## 2021-10-03 LAB — COMPREHENSIVE METABOLIC PANEL
ALT: 29 U/L (ref 0–44)
AST: 18 U/L (ref 15–41)
Albumin: 4 g/dL (ref 3.5–5.0)
Alkaline Phosphatase: 95 U/L (ref 38–126)
Anion gap: 10 (ref 5–15)
BUN: 11 mg/dL (ref 6–20)
CO2: 21 mmol/L — ABNORMAL LOW (ref 22–32)
Calcium: 9.1 mg/dL (ref 8.9–10.3)
Chloride: 108 mmol/L (ref 98–111)
Creatinine, Ser: 0.87 mg/dL (ref 0.44–1.00)
GFR, Estimated: >60 mL/min (ref >60)
Glucose, Bld: 114 mg/dL — ABNORMAL HIGH (ref 70–99)
Potassium: 4.1 mmol/L (ref 3.5–5.1)
Sodium: 139 mmol/L (ref 135–145)
Total Bilirubin: 0.7 mg/dL (ref 0.3–1.2)
Total Protein: 6.5 g/dL (ref 6.5–8.1)

## 2021-10-03 LAB — TSH: TSH: 2.672 u[IU]/mL (ref 0.350–4.500)

## 2021-10-03 LAB — FOLATE: Folate: 23.5 ng/mL (ref >5.9)

## 2021-10-03 LAB — VITAMIN B12: Vitamin B-12: 1348 pg/mL — ABNORMAL HIGH (ref 180–914)

## 2021-10-03 LAB — MAGNESIUM: Magnesium: 1.9 mg/dL (ref 1.7–2.4)

## 2021-10-03 MED ORDER — LACOSAMIDE 50 MG PO TABS
150.0000 mg | ORAL_TABLET | Freq: Every day | ORAL | Status: DC
  Filled 2021-10-03: qty 3

## 2021-10-03 MED ORDER — LACOSAMIDE 50 MG PO TABS
ORAL_TABLET | ORAL | 1 refills | Status: DC
  Filled 2021-10-03: qty 150, 30d supply, fill #0
  Filled 2021-11-09: qty 150, 30d supply, fill #1
  Filled 2021-11-10: qty 150, 30d supply, fill #0

## 2021-10-03 MED ORDER — LACOSAMIDE 50 MG PO TABS
100.0000 mg | ORAL_TABLET | Freq: Every morning | ORAL | Status: DC
  Administered 2021-10-03: 100 mg via ORAL

## 2021-10-03 NOTE — Progress Notes (Signed)
Speech Language Pathology Treatment: Cognitive-Linquistic  Patient Details Name: Meredith Park MRN: [id number] DOB: 11-08-1970 Today's Date: 10/03/2021 Time: 0354-6568 SLP Time Calculation (min) (ACUTE ONLY): 35 min  Assessment / Plan / Recommendation Clinical Impression  Pt seen for higher level cognitive function intervention addressing memory and attention.  Introduced pt to [employer] [facility]) and provided written handout.  Pt completed delayed recall task with 80% accuracy independently.  Pt was able to self correct one item, and required mild cuing for another.  Pt reports frustration with this performance and thinks she should have been at 100% from the start.  Pt also completed divided attention task.  Pt was asked to perform an organizational task and engage in high level conversation with SLP while also attending to what was happening on television. Pt completed task with 100% accuracy independently with additional processing time.  Pt was able to self cue for divided attention task. She did have to pause distractor task to make notes and attend, but was able to resume where she left off.  Pt state that she lost her train of though, but again was able to recall conversation topic and resume it independently.  This marks an improvement from assessment on 1/28.  Pt is an excellent participant in speech therapy and would benefit from ongoing ST to address cognitive function after discharge.  SLP will continue to follow while in-house.    HPI HPI: Meredith Park is a 51 y.o female who presented to the hospital on 1/26 with episodes of confusion/staring-concern for breakthrough seizures. MRI brain: Chronic small vessel disease-incidental calcified pineal gland region cyst; CT negative. PMHx: ETOH abuse (reports sober 2y 51m), HLD and HTN      SLP Plan  Continue with current plan of care      Recommendations for follow up therapy are one component of a multi-disciplinary  discharge planning process, led by the attending physician.  Recommendations may be updated based on patient status, additional functional criteria and insurance authorization.    Recommendations                   Follow Up Recommendations: Outpatient SLP Assistance recommended at discharge: PRN SLP Visit Diagnosis: Cognitive communication deficit (L27.517) Plan: Continue with current plan of care           Kerrie Pleasure, MA, CCC-SLP Acute Rehabilitation Services Office: [phone]   10/03/2021, 10:33 AM

## 2021-10-03 NOTE — Progress Notes (Signed)
PROGRESS NOTE        PATIENT DETAILS Name: Meredith Park Age: 51 y.o. 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    Significant events: 1/26>> admit to hospitalist service-for suspicion of breakthrough seizures.  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  No acute abnormalities. 09/29/2021>> CT head: No acute intracranial abnormalities.  Microbiology data: 1/26>> COVID/flu PCR: Negative  Subjective:  Patient in bed, appears comfortable, denies any headache, no fever, no chest pain or pressure, no shortness of breath , no abdominal pain. No new focal weakness. Thinks might have had a seizure last night.   Objective: Vitals: Blood pressure 109/73, pulse (!) 56, temperature 98.1 F (36.7 C), temperature source Oral, resp. rate 15, height 5\' 3"  (1.6 m), weight 104 kg, last menstrual period 08/28/2020, SpO2 94 %.   Exam:  Awake Alert, No new F.N deficits, Normal affect [place].AT,PERRAL Supple Neck, No JVD,   Symmetrical Chest wall movement, Good air movement bilaterally, CTAB RRR,No Gallops, Rubs or new Murmurs,  +ve B.Sounds, Abd Soft, No tenderness,   No Cyanosis, Clubbing or edema     Assessment/Plan: * Seizure (HCC) Getting a long-term EEG, spot EEG unremarkable Case discussed with neurology defer management to them, Vimpat on hold with as needed IV Ativan on board.  Currently seems to be seizure-free Case discussed with neurologist on 10/02/2021  continue to monitor for another 24 hours in hopes of capturing an event on EEG  Cannabis abuse-Urine drug screen +ve on 1/26 Counseled-Per patient-she consumes edibles.  Hypokalemia Replete and recheck.  Essential (primary) hypertension- (present on admission) BP stable-continue Coreg/losartan.  Adjustment disorder with mixed anxiety and depressed mood- (present on admission) Follows with therapist-not on any medications.  Alcohol dependence in remission ([place]) In remission for 8 years-goes to [employer] meetings.  No signs of withdrawal symptoms.   Morbid Obesity: Estimated body mass index is 40.61 kg/m as calculated from the following:   Height as of this encounter: 5\' 3"  (1.6 m).   Weight as of this encounter: 104 kg.    DVT Prophylaxis: SQ Lovenox Procedures: None Consults: Neurology Code Status:Full code  Family Communication: None at bedside   Disposition Plan: Status is: Inpt  Diet: Diet Order             Diet regular Room service appropriate? Yes; Fluid consistency: Thin  Diet effective now                     Antimicrobial agents: Anti-infectives (From admission, onward)    None        MEDICATIONS: Scheduled Meds:  carvedilol  12.5 mg Oral BID   enoxaparin (LOVENOX) injection  50 mg Subcutaneous Q24H   losartan  50 mg Oral Daily   sodium chloride flush  3 mL Intravenous Q12H   Continuous Infusions: PRN Meds:.acetaminophen **OR** acetaminophen, ibuprofen, LORazepam, polyethylene glycol   I have personally reviewed following labs and imaging studies  LABORATORY DATA:  Recent Labs  Lab 09/29/21  1152 09/30/21 0136 10/02/21 0317 10/03/21 0043  WBC 7.0 7.4 7.0 8.1  HGB 12.9 12.1 13.1 13.4  HCT 37.7 35.5* 38.0 40.2  PLT 199 176 192 206  MCV 81.6 82.2 81.9 83.8  MCH 27.9 28.0 28.2 27.9  MCHC 34.2 34.1 34.5 33.3  RDW 14.0 13.5 13.8 13.9  LYMPHSABS  --   --  1.8 2.0  MONOABS  --   --  0.4 0.5  EOSABS  --   --  0.2 0.2  BASOSABS  --   --   0.0 0.0    Recent Labs  Lab 09/29/21 1152 09/30/21 0136 10/01/21 0105 10/02/21 0317 10/03/21 0043  NA 138 136 137 137 139  K 4.2 3.4* 3.6 4.0 4.1  CL 107 104 108 107 108  CO2 23 23 22  20* 21*  GLUCOSE 121* 112* 119* 103* 114*  BUN 13 11 10 11 11   CREATININE 0.79 0.79 0.75 0.81 0.87  CALCIUM 8.9 8.7* 8.9 8.9 9.1  AST 33 24  --  19 18  ALT 46* 38  --  30 29  ALKPHOS 92 84  --  89 95  BILITOT 0.7 0.6  --  0.7 0.7  ALBUMIN 4.1 3.7  --  3.8 4.0  MG 1.8  --  1.9 1.9 1.9  AMMONIA 22  --   --   --   --     RADIOLOGY STUDIES/RESULTS: No results found.   LOS: 2 days   Signature  Lala Lund M.D on 10/03/2021 at 11:09 AM   -  To page go to [email or web address]

## 2021-10-03 NOTE — Progress Notes (Signed)
Discharge paperwork reviewed with pt. Pt verbalized understanding. Pt's medication from [facility] has been delivered. Pt's mother will transport home via private vehicle. Pt has taken all of her belongings.

## 2021-10-03 NOTE — Progress Notes (Signed)
Physical Therapy Treatment Patient Details Name: Meredith Park MRN: [id number] DOB: [birth year] Today's Date: 10/03/2021   History of Present Illness 51 y.o female who presented to the hospital on 1/26 with episodes of confusion/staring-concern for breakthrough seizures. MRI brain: Chronic small vessel disease-incidental calcified pineal gland region cyst; CT negative. PMHx: ETOH abuse (reports sober 2y 41m), HLD and HTN    PT Comments    Pt was seen for mobility on no AD, able to maintain O2 sats and balance but with occas cues for safety.  Pt is going home with help and will be following up with MD, not going to require follow up PT yet.  Pt is instructed on ROM and strengthening to follow up with on her own.  Continue PT during acute stay as needed.    Recommendations for follow up therapy are one component of a multi-disciplinary discharge planning process, led by the attending physician.  Recommendations may be updated based on patient status, additional functional criteria and insurance authorization.  Follow Up Recommendations  No PT follow up     Assistance Recommended at Discharge Intermittent Supervision/Assistance  Patient can return home with the following Assist for transportation;Assistance with cooking/housework   Equipment Recommendations  None recommended by PT    Recommendations for Other Services       Precautions / Restrictions Precautions Precautions: Fall Precaution Comments: seizure Restrictions Weight Bearing Restrictions: No     Mobility  Bed Mobility Overal bed mobility: Modified Independent                  Transfers Overall transfer level: Modified independent Equipment used: None                    Ambulation/Gait Ambulation/Gait assistance: Modified independent (Device/Increase time) Gait Distance (Feet): 150 Feet Assistive device: None Gait Pattern/deviations: Step-through pattern Gait velocity: reduced Gait velocity  interpretation: <1.31 ft/sec, indicative of household ambulator Pre-gait activities: standing balance ck with lines General Gait Details: pt is requiring an extra moment for sudden turns and to handle turns with correct line up to sit\   Stairs             Wheelchair Mobility    Modified Rankin (Stroke Patients Only)       Balance Overall balance assessment: Needs assistance Sitting-balance support: Feet supported Sitting balance-Leahy Scale: Good     Standing balance support: No upper extremity supported Standing balance-Leahy Scale: Good Standing balance comment: fair dynamic support                            Cognition Arousal/Alertness: Awake/alert Behavior During Therapy: WFL for tasks assessed/performed Overall Cognitive Status: Impaired/Different from baseline Area of Impairment: Memory                     Memory: Decreased short-term memory         General Comments: pt is having trouble with recall of her precautions        Exercises General Exercises - Lower Extremity Ankle Circles/Pumps: AROM, 5 reps Quad Sets: Strengthening, 10 reps Gluteal Sets: Strengthening, 10 reps Hip ABduction/ADduction: AROM, 10 reps    General Comments General comments (skin integrity, edema, etc.): pt is expecting to go home from hosp, does not have interest in practicing stairs as hers are carpeted with B rails      Pertinent Vitals/Pain Pain Assessment Pain Assessment: Faces Faces Pain Scale: No hurt  Home Living                          Prior Function            PT Goals (current goals can now be found in the care plan section) Acute Rehab PT Goals Patient Stated Goal: back to work Progress towards PT goals: Progressing toward goals    Frequency    Min 2X/week      PT Plan Current plan remains appropriate    Co-evaluation              AM-PAC PT "6 Clicks" Mobility   Outcome Measure  Help needed turning  from your back to your side while in a flat bed without using bedrails?: None Help needed moving from lying on your back to sitting on the side of a flat bed without using bedrails?: None Help needed moving to and from a bed to a chair (including a wheelchair)?: None Help needed standing up from a chair using your arms (e.g., wheelchair or bedside chair)?: None Help needed to walk in hospital room?: None Help needed climbing 3-5 steps with a railing? : None 6 Click Score: 24    End of Session Equipment Utilized During Treatment: Gait belt Activity Tolerance: Patient tolerated treatment well Patient left: in bed;with call bell/phone within reach;with family/visitor present Nurse Communication: Mobility status PT Visit Diagnosis: Difficulty in walking, not elsewhere classified (R26.2)     Time: 6237-6283 PT Time Calculation (min) (ACUTE ONLY): 30 min  Charges:  $Gait Training: 8-22 mins $Therapeutic Exercise: 8-22 mins       Ivar Drape 10/03/2021, 3:13 PM  Samul Dada, PT PhD Acute Rehab Dept. Number: [facility] [id number] and [facility] [phone]

## 2021-10-03 NOTE — Discharge Instructions (Signed)
Do not drive, operate heavy machinery, perform activities at heights, swimming or participation in water activities or provide baby sitting services until you have seen by your Neurologist and advised to do so again.  Follow with Primary MD Piedad Climes, [place], PA-C and your neurologist in 7 days   Get CBC, CMP, 2 view Chest X ray -  checked next visit within 1 week by Primary MD    Activity: As tolerated with Full fall precautions use walker/cane & assistance as needed  Disposition Home    Diet: Heart Healthy   Special Instructions: If you have smoked or chewed Tobacco  in the last 2 yrs please stop smoking, stop any regular Alcohol  and or any Recreational drug use.  On your next visit with your primary care physician please Get Medicines reviewed and adjusted.  Please request your Prim.MD to go over all Hospital Tests and Procedure/Radiological results at the follow up, please get all Hospital records sent to your Prim MD by signing hospital release before you go home.  If you experience worsening of your admission symptoms, develop shortness of breath, life threatening emergency, suicidal or homicidal thoughts you must seek medical attention immediately by calling 911 or calling your MD immediately  if symptoms less severe.  You Must read complete instructions/literature along with all the possible adverse reactions/side effects for all the Medicines you take and that have been prescribed to you. Take any new Medicines after you have completely understood and accpet all the possible adverse reactions/side effects.

## 2021-10-03 NOTE — Progress Notes (Addendum)
Subjective: No events overnight.  Patient reports occasionally feeling lightheaded since being on Vimpat.  Also reports hypersexualilty and changing behavior (used to be more introverted but is now very extroverted).  She is not sure if this is related to her medications versus her being sober now.   ROS: negative except above  Examination  Vital signs in last 24 hours: Temp:  [97.7 F (36.5 C)-98.2 F (36.8 C)] 98.1 F (36.7 C) (01/30 0752) Pulse Rate:  [56-79] 60 (01/30 1151) Resp:  [15-19] 17 (01/30 1151) BP: (107-137)/(67-93) 134/88 (01/30 1151) SpO2:  [91 %-95 %] 94 % (01/30 1151)  General: lying in bed, NAD CVS: pulse-normal rate and rhythm RS: breathing comfortably, CTAB Extremities: normal  Neuro: AOX3, CN 2-12 intact grossly, 5/5 in all extremities, FTN intact BL  Basic Metabolic Panel: Recent Labs  Lab 09/29/21 1152 09/30/21 0136 10/01/21 0105 10/02/21 0317 10/03/21 0043  NA 138 136 137 137 139  K 4.2 3.4* 3.6 4.0 4.1  CL 107 104 108 107 108  CO2 23 23 22  20* 21*  GLUCOSE 121* 112* 119* 103* 114*  BUN 13 11 10 11 11   CREATININE 0.79 0.79 0.75 0.81 0.87  CALCIUM 8.9 8.7* 8.9 8.9 9.1  MG 1.8  --  1.9 1.9 1.9    CBC: Recent Labs  Lab 09/29/21 1152 09/30/21 0136 10/02/21 0317 10/03/21 0043  WBC 7.0 7.4 7.0 8.1  NEUTROABS  --   --  4.6 5.4  HGB 12.9 12.1 13.1 13.4  HCT 37.7 35.5* 38.0 40.2  MCV 81.6 82.2 81.9 83.8  PLT 199 176 192 206     Coagulation Studies: No results for input(s): LABPROT, INR in the last 72 hours.  Imaging No new brain imaging overnight  ASSESSMENT AND PLAN: 51 year old female with recurrent episodes of alteration of awareness.   Transient alteration of awareness Cannabis use disorder -Patient has had multiple episodes of alteration of awareness, with speech disturbance. LTM EEG was performed for almost 72 hours after stopping Vimpat.  Small sharp spikes were noted in right frontotemporal region which are not epileptiform.   No definite ictal-interictal abnormality was noted.  No events were recorded. Patient reported these episodes of started after she quit alcohol for over 2 years.  The semiology of the episodes (with an aura of weird smell and taste) does not sound concerning for seizures.  However, patient also reports significant stressors including her son being on the street and using drugs.   -Differential for these episodes include seizures, nonepileptic events, sleep apnea causing daytime drowsiness, less likely toxic-metabolic issues, alteration of awareness due to cannabis use  Recommendations -Recommend increasing Vimpat to 150 mg nightly, continue 100 mg every morning -We will check vitamin B12, folate, TSH, thiamine levels to look for toxic -metabolic causes -If patient notices any further side effects, recommend contacting primary neurologist Dr. Teresa Coombsamara.  We may have to switch patient to a different antiepileptic at that point like oxcarbazepine or zonisamide -Patient has also been referred for evaluation of sleep apnea -If these episodes persist, would recommend epilepsy monitoring unit admission.  I discussed this with patient -Also counseled patient against cannabis use -I discussed seizure precautions including do not drive for 6 months/until cleared by physician -[facility] at 9:15 AM  Seizure precautions: [place], patients with seizures are not allowed to drive until they have been seizure-free for six months and cleared by a physician    Use caution when  using heavy equipment or power tools. Avoid working on ladders or at heights. Take showers instead of baths. Ensure the water temperature is not too high on the home water heater. Do not go swimming alone. Do not lock yourself in a room alone (i.e. bathroom). When caring for infants or small children, sit down when holding, feeding, or changing them to minimize risk of injury to the child  in the event you have a seizure. Maintain good sleep hygiene. Avoid alcohol.    If patient has another seizure, call 911 and bring them back to the ED if: A.  The seizure lasts longer than 5 minutes.      B.  The patient doesn't wake shortly after the seizure or has new problems such as difficulty seeing, speaking or moving following the seizure C.  The patient was injured during the seizure D.  The patient has a temperature over 102 F (39C) E.  The patient vomited during the seizure and now is having trouble breathing    During the Seizure   - First, ensure adequate ventilation and place patients on the floor on their left side  Loosen clothing around the neck and ensure the airway is patent. If the patient is clenching the teeth, do not force the mouth open with any object as this can cause severe damage - Remove all items from the surrounding that can be hazardous. The patient may be oblivious to what's happening and may not even know what he or she is doing. If the patient is confused and wandering, either gently guide him/her away and block access to outside areas - Reassure the individual and be comforting - Call 911. In most cases, the seizure ends before EMS arrives. However, there are cases when seizures may last over 3 to 5 minutes. Or the individual may have developed breathing difficulties or severe injuries. If a pregnant patient or a person with diabetes develops a seizure, it is prudent to call an ambulance. - Finally, if the patient does not regain full consciousness, then call EMS. Most patients will remain confused for about 45 to 90 minutes after a seizure, so you must use judgment in calling for help. - Avoid restraints but make sure the patient is in a bed with padded side rails - Place the individual in a lateral position with the neck slightly flexed; this will help the saliva drain from the mouth and prevent the tongue from falling backward - Remove all nearby furniture  and other hazards from the area - Provide verbal assurance as the individual is regaining consciousness - Provide the patient with privacy if possible - Call for help and start treatment as ordered by the caregiver    After the Seizure (Postictal Stage)   After a seizure, most patients experience confusion, fatigue, muscle pain and/or a headache. Thus, one should permit the individual to sleep. For the next few days, reassurance is essential. Being calm and helping reorient the person is also of importance.   Most seizures are painless and end spontaneously. Seizures are not harmful to others but can lead to complications such as stress on the lungs, brain and the heart. Individuals with prior lung problems may develop labored breathing and respiratory distress.    I have spent a total of  40 minutes with the patient reviewing hospital notes,  test results, labs and examining the patient as well as establishing an assessment and plan that was discussed personally with the patient.  > 50% of  time was spent in direct patient care.   Lindie Spruce Epilepsy [facility] For questions after 5pm please refer to AMION to reach the Neurologist on call

## 2021-10-03 NOTE — Progress Notes (Signed)
Discontinued cEEG study.  Notified [facility] monitoring.  No skin breakdown observed. 

## 2021-10-03 NOTE — Discharge Summary (Signed)
Meredith Park [id number] DOB: Dec 15, 1970 DOA: 09/29/2021  PCP: Elisabeth Cara, PA-C  Admit date: 09/29/2021  Discharge date: 10/03/2021  Admitted From: Home   Disposition:  Home   Recommendations for Outpatient Follow-up:   Follow up with PCP in 1-2 weeks  PCP Please obtain BMP/CBC, 2 view CXR in 1week,  (see Discharge instructions)   PCP Please follow up on the following pending results:    Home Health: None   Equipment/Devices: None Consultations: Neuro Discharge Condition: Stable    CODE STATUS: Full    Diet Recommendation: Heart Healthy   Diet Order             Diet - low sodium heart healthy           Diet regular Room service appropriate? 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     Significant events: 1/26>> admit to hospitalist service-for suspicion of breakthrough seizures.   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  No acute abnormalities. 09/29/2021>> CT head: No acute intracranial abnormalities.   Microbiology data: 1/26>> COVID/flu PCR: Negative                                                                 Hospital Course   Seizure [facility]) She had unremarkable spot EEG and  overnight EEG thus far has been unremarkable, she was seen by neurology, could not have a Cerebral event here however neurology has adjusted her Vimpat dose with 150 mg at night and 100 mg in daytime, will be discharged home with return seizure instructions, follow-up with her PCP and primary neurologist within a week   Cannabis abuse-Urine drug screen +ve on 1/26 Counseled-Per patient-she consumes edibles.   Hypokalemia Replaced and stable.   Essential (primary) hypertension- (present on admission) BP stable-continue Coreg/losartan.   Adjustment disorder with mixed anxiety and depressed mood- (present on admission) Follows with therapist-not on any medications.   Alcohol dependence in remission ([place]) In remission for 8 years-goes to [place] meetings.  No signs of withdrawal symptoms.     Morbid Obesity: Estimated body mass index is 40.  Follow with PCP for weight loss.   Discharge diagnosis     Principal Problem:  Seizure [facility]) Active Problems:   Alcohol dependence in remission ([place])   Adjustment disorder with mixed anxiety and depressed mood   Essential (primary) hypertension   Hypokalemia   Cannabis abuse-Urine drug screen +ve on 1/26    Discharge instructions    Discharge Instructions     Diet - low sodium heart healthy   Complete by: As directed    Discharge instructions   Complete by: As directed    Do not drive, operate heavy machinery, perform activities at heights, swimming or participation in water activities or provide baby sitting services until you have seen by your Neurologist and advised to do so again.  Follow with Primary MD Belva Bertin, [place], PA-C and your neurologist in 7 days   Get CBC, CMP, 2 view Chest X ray -  checked next visit within 1 week by Primary MD    Activity: As tolerated with Full fall precautions use walker/cane & assistance as needed  Disposition Home    Diet: Heart Healthy   Special Instructions: If you have smoked or chewed  Tobacco  in the last 2 yrs please stop smoking, stop any regular Alcohol  and or any Recreational drug use.  On your next visit with your primary care physician please Get Medicines reviewed and adjusted.  Please request your Prim.MD to go over all Hospital Tests and Procedure/Radiological results at the follow up, please get all Hospital records sent to your Prim MD by signing hospital release before you go home.  If you experience worsening of your admission symptoms, develop shortness of breath, life threatening emergency, suicidal or homicidal thoughts you must seek medical attention immediately by calling 911 or calling your MD immediately  if symptoms less severe.  You Must read complete instructions/literature along with all the possible adverse reactions/side effects for all the Medicines you take and that have been prescribed to you. Take any new Medicines after you have completely understood and accpet all the possible adverse reactions/side effects.       Discharge Medications   Allergies as of 10/03/2021       Reactions   Losartan Potassium-hctz    "Out of mind"/high ammonia level   Meperidine Itching        Medication List     TAKE these medications    carvedilol 12.5 MG tablet Commonly known as: COREG Take 1 tablet (12.5 mg total) by mouth 2 (two) times daily.   dimenhyDRINATE 50 MG tablet Commonly known as: DRAMAMINE Take 50 mg by mouth daily as needed for nausea.   folic acid 1 MG tablet Commonly known as: FOLVITE Take 1 tablet (1 mg total) by mouth daily.   Lacosamide 100 MG Tabs Take 100 mg in the morning and 150 mg at night dispense 1 month supply accordingly with 1 refill What changed:  how much to take how to take this when to take this additional instructions   losartan 50 MG tablet Commonly known as: COZAAR Take 50 mg by mouth daily. Take only if blood pressure is over 139.   multivitamin with minerals Tabs tablet Take 1 tablet by mouth  daily.   SUMAtriptan 100 MG tablet Commonly known as: IMITREX Take 100 mg by mouth daily as needed for migraine.   thiamine 100 MG tablet Take 100 mg by mouth daily.   Vitamin D (Ergocalciferol) 1.25 MG (50000 UNIT) Caps capsule Commonly known as: DRISDOL Take 50,000 Units by mouth once a week. [place]  Follow-up Information     [place], [place], [place]. Schedule an appointment as soon as possible for a visit in 1 week(s).   Specialty: Family Medicine Why: And your neurologist within a week Contact information: [address] [place] [place] [address] [phone]         [facility]. Schedule an appointment as soon as possible for a visit in 1 week(s).   Contact information: [address]     [place] [phone]                Major procedures and Radiology Reports - PLEASE review detailed and final reports thoroughly  -       CT Head Wo Contrast  Result Date: 09/29/2021 CLINICAL DATA:  Altered mental status, nontraumatic. Headache. Patient feels "out of it." EXAM: CT HEAD WITHOUT CONTRAST TECHNIQUE: Contiguous axial images were obtained from the base of the skull through the vertex without intravenous contrast. RADIATION DOSE REDUCTION: This exam was performed according to the departmental dose-optimization program which includes automated exposure control, adjustment of the mA and/or kV according to patient size and/or use of iterative reconstruction technique. COMPARISON:  CT brain 08/05/2021 FINDINGS: Brain: The ventricles are normal in size and configuration. The basilar cisterns are patent. No mass, mass effect, or midline shift. No acute intracranial hemorrhage is seen. No abnormal extra-axial fluid collection. Preservation of the normal cortical gray-white interface without CT evidence of an acute major vascular territorial cortical based infarction. Vascular: No hyperdense vessel  or unexpected calcification. Skull: Normal. Negative for fracture or focal lesion. Sinuses/Orbits: The visualized orbits are unremarkable. The visualized paranasal sinuses and mastoid air cells are clear. Other: None. Electronically Signed   By: Yvonne Kendall M.D.   On: 09/29/2021 14:16   MR BRAIN W WO CONTRAST  Result Date: 09/16/2021  [facility] [address], [place], [place] [address] [phone] NEUROIMAGING REPORT STUDY DATE: 09/14/2021 PATIENT NAME: Bonetta Stigers DOB: 1971/08/05 MRN: [id number] ORDERING CLINICIAN: Dr. April Manson CLINICAL HISTORY: 51 year old patient being evaluated for seizure-like activity COMPARISON FILMS: CT head 07/12/2021 EXAM: MRI brain with and without contrast TECHNIQUE: Sagittal T1, axial T1, T2, DWI, ADC map, SWI, FLAIR, coronal T2 and T1 as well as postcontrast axial and coronal T1 images were obtained through the brain CONTRAST: 20 mL IV MultiHance  IMAGING SITE: [facility] imaging at [place]: The brain parenchyma shows nonspecific periventricular and subcortical white matter hyperintensities which may be seen in variety of conditions including small vessel disease, migraine headache, demyelinating, autoimmune, infectious or inflammatory conditions.  None of these involve the corpus callosum, brainstem and cerebellum or show any postcontrast enhancement.  A 7 x 7 mm calcified nonenhancing pineal region cyst is noted incidentally without any associated mass-effect.  Diffusion-weighted imaging is negative for acute ischemia.  SWI images unremarkable except for the calcified pineal cyst.  Subarachnoid space and umbilical system appear normal.  Calvarium shows no abnormalities.  Extra-axial brain structures appear normal.  Orbits appear unremarkable.  Paranasal sinuses show mild chronic inflammatory mucosal thickening.  Pituitary gland is partially atrophied suggestive of partially empty sella.  Visualized portion of upper cervical spine show mild  degenerative changes.  Flow-voids of large vessels of intracranial circulation appear to be patent.  Postcontrast images do not result in abnormal areas of enhancement.   MRI scan of the brain with and without contrast showing only mild nonspecific changes of chronic small vessel disease.  Incidental calcified  small pineal region cyst is noted. INTERPRETING PHYSICIAN: Antony Contras, MD Certified in  Neuroimaging by [place] [employer] and [employer]  Overnight EEG with video  Result Date: 09/30/2021 Lora Havens, MD     10/01/2021  8:56 AM Patient Name: Amonda Plott MRN: [id number] Epilepsy Attending: Lora Havens Referring Physician/Provider: Greta Doom, MD Duration: 09/30/2021 0253 to 10/01/2021 0253 Patient history:  51 year old female with recurrent episodes of confusional states most likely representing right temporal seizures.  EEG to evaluate for seizure. Level of alertness: Awake, asleep AEDs during EEG study: LCM Technical aspects: This EEG study was done with scalp electrodes positioned according to the 10-20 International system of electrode placement. Electrical activity was acquired at a sampling rate of 500Hz  and reviewed with a high frequency filter of 70Hz  and a low frequency filter of 1Hz . EEG data were recorded continuously and digitally stored. Description: The posterior dominant rhythm consists of 9 Hz activity of moderate voltage (25-35 uV) seen predominantly in posterior head regions, symmetric and reactive to eye opening and eye closing. Sleep was characterized by vertex waves, sleep spindles (12 to 14 Hz), maximal frontocentral region. Physiologic photic driving was seen during photic stimulation.  No EEG change was seen during hyperventilation   IMPRESSION: This study is within normal limits. No seizures or epileptiform discharges were seen throughout the recording. Priyanka Barbra Sarks       Today   Subjective     Timeko Tidrick today has no headache,no chest abdominal pain,no new weakness tingling or numbness, feels much better wants to go home today.     Objective   Blood pressure 134/88, pulse 60, temperature 98.1 F (36.7 C), temperature source Oral, resp. rate 17, height 5\' 3"  (1.6 m), weight 104 kg, last menstrual period 08/28/2020, SpO2 94 %.   Intake/Output Summary (Last 24 hours) at 10/03/2021 1204 Last data filed at 10/02/2021 1623 Gross per 24 hour  Intake 150 ml  Output --  Net 150 ml    Exam  Awake Alert, No new F.N deficits,    [place].AT,PERRAL Supple Neck,   Symmetrical Chest wall movement, Good air movement bilaterally, CTAB RRR,No Gallops,   +ve B.Sounds, Abd Soft, Non tender,  No Cyanosis, Clubbing or edema    Data Review   CBC w Diff:  Lab Results  Component Value Date   WBC 8.1 10/03/2021   HGB 13.4 10/03/2021   HCT 40.2 10/03/2021   PLT 206 10/03/2021   LYMPHOPCT 24 10/03/2021   MONOPCT 7 10/03/2021   EOSPCT 2 10/03/2021   BASOPCT 0 10/03/2021    CMP:  Lab Results  Component Value Date   NA 139 10/03/2021   K 4.1 10/03/2021   CL 108 10/03/2021   CO2 21 (L) 10/03/2021   BUN 11 10/03/2021   CREATININE 0.87 10/03/2021   PROT 6.5 10/03/2021   ALBUMIN 4.0 10/03/2021   BILITOT 0.7 10/03/2021   ALKPHOS 95 10/03/2021   AST 18 10/03/2021   ALT 29 10/03/2021  .   Total Time in preparing paper work, data evaluation and todays exam - 21 minutes  Lala Lund M.D on 10/03/2021 at 12:04 PM  [facility]

## 2021-10-03 NOTE — Procedures (Signed)
Patient Name: Meredith Park  MRN: [id number]  Epilepsy Attending: Charlsie Quest  Referring Physician/Provider: Rejeana Brock, MD Duration: 10/03/2021 0253 to 10/03/2021 1054   Patient history:  51 year old female with recurrent episodes of confusional states most likely representing right temporal seizures.  EEG to evaluate for seizure.   Level of alertness: Awake, asleep   AEDs during EEG study: None   Technical aspects: This EEG study was done with scalp electrodes positioned according to the 10-20 International system of electrode placement. Electrical activity was acquired at a sampling rate of 500Hz  and reviewed with a high frequency filter of 70Hz  and a low frequency filter of 1Hz . EEG data were recorded continuously and digitally stored.    Description: The posterior dominant rhythm consists of 9 Hz activity of moderate voltage (25-35 uV) seen predominantly in posterior head regions, symmetric and reactive to eye opening and eye closing. Sleep was characterized by vertex waves, sleep spindles (12 to 14 Hz), maximal frontocentral region. Small sharp spikes ( SSS) were noted in right frontotemporal region.   IMPRESSION: This study is within normal limits. No seizures or epileptiform discharges were seen throughout the recording.   Meredith Park 

## 2021-10-04 ENCOUNTER — Telehealth: Payer: Self-pay | Admitting: Neurology

## 2021-10-04 LAB — LACOSAMIDE: Lacosamide: 5 ug/mL (ref 5.0–10.0)

## 2021-10-04 NOTE — Telephone Encounter (Signed)
Pt called in to let Dr. Teresa Coombs know that she had a seizure a few days ago and was recently discharged from [facility]. Moved up her f/u for 2/6 at 12:45.

## 2021-10-05 LAB — VITAMIN B1: Vitamin B1 (Thiamine): 156 nmol/L (ref 66.5–200.0)

## 2021-10-10 ENCOUNTER — Ambulatory Visit (INDEPENDENT_AMBULATORY_CARE_PROVIDER_SITE_OTHER): Payer: BC Managed Care – PPO | Admitting: Neurology

## 2021-10-10 ENCOUNTER — Encounter: Payer: Self-pay | Admitting: Neurology

## 2021-10-10 VITALS — BP 143/91 | HR 80 | Ht 63.0 in | Wt 231.5 lb

## 2021-10-10 DIAGNOSIS — R569 Unspecified convulsions: Secondary | ICD-10-CM | POA: Diagnosis not present

## 2021-10-10 DIAGNOSIS — Z5181 Encounter for therapeutic drug level monitoring: Secondary | ICD-10-CM

## 2021-10-10 NOTE — Progress Notes (Signed)
[facility]  PATIENT: Meredith Park DOB: 08/28/1971  REQUESTING CLINICIAN: Piedad Climes, [place] E, * HISTORY FROM: Patient and mother  REASON FOR VISIT: Seizure like activity.    HISTORICAL  CHIEF COMPLAINT:  Chief Complaint  Patient presents with   Follow-up    Room 13 w/ mother, [name]. Hospital follow up from admission on 09/29/21. Vimpat 50mg  was increased while there (taking 2 tabs in am and 3 tab in pm). No further seizures since discharge.    INTERVAL HISTORY 10/10/2021:  Patient presents for follow-up, last visit was in December, at that time plan was to get an EEG and brain MRI. EEG completed and showed right temporal sharp and intermittent right temporal slowing. MRI brain with no acute finding.  Patient presented to the emergency department on January 26, at that time she was reporting multiple seizure-like episode, reportedly staring, she stated that she could not understand, could not state her name, could not even remember her mother's name. Episode would last about 10 minutes.  She was admitted to the hospital, had a long-term monitoring for total of 5 days and no seizures were captured, and there was no evidence of epileptiform discharges.  Patient was discharged home with slight increase of Vimpat 150 mg in the morning and 100 at night with plan to follow-up with me. She reports currently she is under a lot of stress due to her son.  Her son has a diagnosis of schizophrenia and he is abusing street drugs, currently he is living in the street.  Her father was able to get him a apartment next to her house and this is again a big source of stress per patient.    HISTORY OF PRESENT ILLNESS:  This is a 51 year old woman with PMHx Seizure in the setting of alcohol withdrawal, hypertension, hyperlipidemia, alcohol abuse in remission who is presenting with complaint of seizures.  Patient said that 10 days ago he was riding in her car, started to get warm felt  like she was responding but family told her the only thing she was saying was okay, okay, okay,.  She was able to pull the car on the side of the road and get out of the car.  Patient reported once she get out of the car, the next thing that she remembers is waking up 40 minutes later in the ambulance, she did not remember the event.  Mother was present and witnessed patient coming out of the car, she was staring, felt like she was going to fall down and she help her to the ground, no abnormal movements noted but patient was unresponsive for about 5 minutes, EMS was called.  Mother also has reported episode of staring, where she will stare, be unresponsive lasting seconds and she will go back to her normal self.  No episode of abnormal movement, no convulsion.  The night before the event patient reported taking her son's Adderall.  Patient stated that she is going through menopause and thinks some of these hot flashes are related to menopause. She reported in the end in the month of June she was admitted for altered mental status, noted that her ammonia level was elevated and was told that it was secondary to the losartan which was discontinued. She reported history of alcohol abuse states that she has been sober for the past 2-1/2 years.  However she does take marijuana cookies at night. Patient also reports loud snoring, daytime fatigue.  She states she has a  smart watch that has been telling her sleep quality is poor, that she has apnea, up to 5 times per hour. She has never been evaluated for sleep apnea.   Handedness: right handed   Seizure Type: Staring type, unresponsive  Current frequency: Last episode was 10 days ago.  Any injuries from seizures: None  Seizure risk factors: Alcohol abuse, No family history of seizure  Previous ASMs: None   Currenty ASMs: None   ASMs side effects: Not applicable  Brain Images: Head CT November 2022 which was normal  Previous EEGs: Not done   OTHER  MEDICAL CONDITIONS: Alcohol abuse in remission, HTN, HLD, sleep apnea  REVIEW OF SYSTEMS: Full 14 system review of systems performed and negative with exception of: as noted in the HPI   ALLERGIES: Allergies  Allergen Reactions   Losartan Potassium-Hctz     "Out of mind"/high ammonia level   Meperidine Itching    HOME MEDICATIONS: Outpatient Medications Prior to Visit  Medication Sig Dispense Refill   carvedilol (COREG) 12.5 MG tablet Take 1 tablet (12.5 mg total) by mouth 2 (two) times daily. 30 tablet 0   dimenhyDRINATE (DRAMAMINE) 50 MG tablet Take 50 mg by mouth daily as needed for nausea.     lacosamide (VIMPAT) 50 MG TABS tablet take 2 tablets (100 mg total) in the morning and 3 tablets (150 mg total) every evening. 150 tablet 1   losartan (COZAAR) 50 MG tablet Take 50 mg by mouth daily. Take only if blood pressure is over 139.     Multiple Vitamin (MULTIVITAMIN WITH MINERALS) TABS tablet Take 1 tablet by mouth daily.     SUMAtriptan (IMITREX) 100 MG tablet Take 100 mg by mouth daily as needed for migraine.     Vitamin D, Ergocalciferol, (DRISDOL) 1.25 MG (50000 UNIT) CAPS capsule Take 50,000 Units by mouth once a week. Sturdays     folic acid (FOLVITE) 1 MG tablet Take 1 tablet (1 mg total) by mouth daily. (Patient not taking: Reported on 09/29/2021) 30 tablet 0   thiamine 100 MG tablet Take 100 mg by mouth daily.     No facility-administered medications prior to visit.    PAST MEDICAL HISTORY: Past Medical History:  Diagnosis Date   Alcohol abuse    Alcohol withdrawal (HCC) 08/28/2018   High cholesterol    Hypertension    Seizures (HCC)     PAST SURGICAL HISTORY: History reviewed. No pertinent surgical history.  FAMILY HISTORY: Family History  Family history unknown: Yes    SOCIAL HISTORY: Social History   Socioeconomic History   Marital status: Divorced    Spouse name: Not on file   Number of children: Not on file   Years of education: Not on file    Highest education level: Not on file  Occupational History   Not on file  Tobacco Use   Smoking status: Never   Smokeless tobacco: Never  Vaping Use   Vaping Use: Some days  Substance and Sexual Activity   Alcohol use: Not Currently   Drug use: Yes    Types: Marijuana   Sexual activity: Not on file  Other Topics Concern   Not on file  Social History Narrative   Not on file   Social Determinants of Health   Financial Resource Strain: Not on file  Food Insecurity: Not on file  Transportation Needs: Not on file  Physical Activity: Not on file  Stress: Not on file  Social Connections: Not on file  Intimate  Partner Violence: Not on file    PHYSICAL EXAM  GENERAL EXAM/CONSTITUTIONAL: Vitals:  Vitals:   10/10/21 1230  BP: (!) 143/91  Pulse: 80  Weight: 231 lb 8 oz (105 kg)  Height: 5\' 3"  (1.6 m)    Body mass index is 41.01 kg/m. Wt Readings from Last 3 Encounters:  10/10/21 231 lb 8 oz (105 kg)  09/29/21 229 lb 4.5 oz (104 kg)  08/17/21 231 lb (104.8 kg)   Patient is in no distress; well developed, nourished and groomed; neck is supple  CARDIOVASCULAR: Examination of carotid arteries is normal; no carotid bruits Regular rate and rhythm, no murmurs Examination of peripheral vascular system by observation and palpation is normal  EYES: Pupils round and reactive to light, Visual fields full to confrontation, Extraocular movements intacts,  No results found.  MUSCULOSKELETAL: Gait, strength, tone, movements noted in Neurologic exam below  NEUROLOGIC: MENTAL STATUS:  No flowsheet data found. awake, alert, oriented to person, place and time recent and remote memory intact normal attention and concentration language fluent, comprehension intact, naming intact fund of knowledge appropriate  CRANIAL NERVE:  2nd, 3rd, 4th, 6th - pupils equal and reactive to light, visual fields full to confrontation, extraocular muscles intact, no nystagmus 5th - facial  sensation symmetric 7th - facial strength symmetric 8th - hearing intact 9th - palate elevates symmetrically, uvula midline 11th - shoulder shrug symmetric 12th - tongue protrusion midline  MOTOR:  normal bulk and tone, full strength in the BUE, BLE  SENSORY:  normal and symmetric to light touch, pinprick, temperature, vibration  COORDINATION:  finger-nose-finger, fine finger movements normal  REFLEXES:  deep tendon reflexes present and symmetric  GAIT/STATION:  normal   DIAGNOSTIC DATA (LABS, IMAGING, TESTING) - I reviewed patient records, labs, notes, testing and imaging myself where available.  Lab Results  Component Value Date   WBC 8.1 10/03/2021   HGB 13.4 10/03/2021   HCT 40.2 10/03/2021   MCV 83.8 10/03/2021   PLT 206 10/03/2021      Component Value Date/Time   NA 139 10/03/2021 0043   K 4.1 10/03/2021 0043   CL 108 10/03/2021 0043   CO2 21 (L) 10/03/2021 0043   GLUCOSE 114 (H) 10/03/2021 0043   BUN 11 10/03/2021 0043   CREATININE 0.87 10/03/2021 0043   CALCIUM 9.1 10/03/2021 0043   PROT 6.5 10/03/2021 0043   ALBUMIN 4.0 10/03/2021 0043   AST 18 10/03/2021 0043   ALT 29 10/03/2021 0043   ALKPHOS 95 10/03/2021 0043   BILITOT 0.7 10/03/2021 0043   GFRNONAA >60 10/03/2021 0043   GFRAA >60 11/10/2018 1016   No results found for: CHOL, HDL, LDLCALC, LDLDIRECT, TRIG No results found for: HGBA1C Lab Results  Component Value Date   VITAMINB12 1,348 (H) 10/03/2021   Lab Results  Component Value Date   TSH 2.672 10/03/2021     Head CT 07/12/2021 No acute intracranial abnormality.   MRI Brain 09/14/2021 MRI scan of the brain with and without contrast showing only mild nonspecific changes of chronic small vessel disease.  Incidental calcified small pineal region cyst is noted   Routine EEG 08/23/2021 Right temporal sharps  Intermittent right temporal slowing    LTM EEG 09/30/2021 This study is within normal limits. No seizures or epileptiform  discharges were seen throughout the recording.   I personally reviewed brain Images and EEG.   ASSESSMENT AND PLAN  51 y.o. year old female  with past medical history of alcohol abuse in remission,  hypertension, hyperlipidemia and 1 event of alcohol withdrawal seizure who is presenting for follow-up for seizure-like event.  She was started on Vimpat 100 mg twice daily after last visit, but presented to the ED on January 26 for additional seizure-like events where she was described to be unresponsive, she could not remember her name, remember her mother's name, there was also episode of temperature changes.  She was admitted, had a long-term monitoring and her EEG was normal, no epileptiform discharges and no seizures.  At that time she also reported increased stress, she is still dealing with increased stress related to her son who is schizophrenic, lives in the street and abuse drugs.  They are trying to get an apartment, actually they found an apartment next to her house but again this is a big source of stress for her.  I explained to the patient that given her routine EEG showed some abnormalities, this event is still not clear, epileptic versus nonepileptic seizure.  For now I will continue the Vimpat, but if she continues to experience seizure-like activity, I will admit her to the EMU for capture and characterization.  She voices understanding and she is comfortable with plans.  I will see her in 6 months follow-up.   1. Seizure-like activity (HCC)   2. Therapeutic drug monitoring     Patient Instructions  Continue with Vimpat 100 mg in the morning and 150 mg at night, I will obtain a Vimpat level and CMP today. Follow-up with your therapist for stress management I informed the patient if she continued to have these event, I will send her to the EMU for further capture and characterization. Follow-up in 6 months or sooner if worse.   Per [facility] statutes, patients with seizures  are not allowed to drive until they have been seizure-free for six months.  Other recommendations include using caution when using heavy equipment or power tools. Avoid working on ladders or at heights. Take showers instead of baths.  Do not swim alone.  Ensure the water temperature is not too high on the home water heater. Do not go swimming alone. Do not lock yourself in a room alone (i.e. bathroom). When caring for infants or small children, sit down when holding, feeding, or changing them to minimize risk of injury to the child in the event you have a seizure. Maintain good sleep hygiene. Avoid alcohol.  Also recommend adequate sleep, hydration, good diet and minimize stress.   During the Seizure  - First, ensure adequate ventilation and place patients on the floor on their left side  Loosen clothing around the neck and ensure the airway is patent. If the patient is clenching the teeth, do not force the mouth open with any object as this can cause severe damage - Remove all items from the surrounding that can be hazardous. The patient may be oblivious to what's happening and may not even know what he or she is doing. If the patient is confused and wandering, either gently guide him/her away and block access to outside areas - Reassure the individual and be comforting - Call 911. In most cases, the seizure ends before EMS arrives. However, there are cases when seizures may last over 3 to 5 minutes. Or the individual may have developed breathing difficulties or severe injuries. If a pregnant patient or a person with diabetes develops a seizure, it is prudent to call an ambulance. - Finally, if the patient does not regain full consciousness, then call EMS.  Most patients will remain confused for about 45 to 90 minutes after a seizure, so you must use judgment in calling for help. - Avoid restraints but make sure the patient is in a bed with padded side rails - Place the individual in a lateral position  with the neck slightly flexed; this will help the saliva drain from the mouth and prevent the tongue from falling backward - Remove all nearby furniture and other hazards from the area - Provide verbal assurance as the individual is regaining consciousness - Provide the patient with privacy if possible - Call for help and start treatment as ordered by the caregiver   After the Seizure (Postictal Stage)  After a seizure, most patients experience confusion, fatigue, muscle pain and/or a headache. Thus, one should permit the individual to sleep. For the next few days, reassurance is essential. Being calm and helping reorient the person is also of importance.  Most seizures are painless and end spontaneously. Seizures are not harmful to others but can lead to complications such as stress on the lungs, brain and the heart. Individuals with prior lung problems may develop labored breathing and respiratory distress.     Orders Placed This Encounter  Procedures   Lacosamide   Basic Metabolic Panel     No orders of the defined types were placed in this encounter.    Return in about 6 months (around 04/09/2022).    Windell NorfolkAmadou Daylene Vandenbosch, MD 10/10/2021, 4:59 PM  [facility] [address], [address] [place], [place] [id number] [phone]

## 2021-10-10 NOTE — Patient Instructions (Addendum)
Continue with Vimpat 100 mg in the morning and 150 mg at night, I will obtain a Vimpat level and CMP today. Follow-up with your therapist for stress management I informed the patient if she continued to have these event, I will send her to the EMU for further capture and characterization. Follow-up in 6 months or sooner if worse.

## 2021-10-11 ENCOUNTER — Encounter: Payer: Self-pay | Admitting: *Deleted

## 2021-10-13 LAB — BASIC METABOLIC PANEL
BUN/Creatinine Ratio: 19 (ref 9–23)
BUN: 12 mg/dL (ref 6–24)
CO2: 21 mmol/L (ref 20–29)
Calcium: 9.7 mg/dL (ref 8.7–10.2)
Chloride: 109 mmol/L — ABNORMAL HIGH (ref 96–106)
Creatinine, Ser: 0.64 mg/dL (ref 0.57–1.00)
Glucose: 99 mg/dL (ref 70–99)
Potassium: 4.2 mmol/L (ref 3.5–5.2)
Sodium: 144 mmol/L (ref 134–144)
eGFR: 108 mL/min/{1.73_m2} (ref >59)

## 2021-10-13 LAB — LACOSAMIDE: Lacosamide: 6.7 ug/mL (ref 5.0–10.0)

## 2021-10-25 ENCOUNTER — Encounter: Payer: Self-pay | Admitting: Neurology

## 2021-10-25 ENCOUNTER — Ambulatory Visit: Payer: BC Managed Care – PPO | Admitting: Neurology

## 2021-10-25 VITALS — BP 105/73 | HR 81 | Ht 63.0 in | Wt 238.8 lb

## 2021-10-25 DIAGNOSIS — R0683 Snoring: Secondary | ICD-10-CM | POA: Diagnosis not present

## 2021-10-25 DIAGNOSIS — R351 Nocturia: Secondary | ICD-10-CM

## 2021-10-25 DIAGNOSIS — G40909 Epilepsy, unspecified, not intractable, without status epilepticus: Secondary | ICD-10-CM

## 2021-10-25 DIAGNOSIS — G4719 Other hypersomnia: Secondary | ICD-10-CM | POA: Diagnosis not present

## 2021-10-25 DIAGNOSIS — G478 Other sleep disorders: Secondary | ICD-10-CM

## 2021-10-25 NOTE — Progress Notes (Signed)
Subjective:    Patient ID: Meredith Park is a 51 y.o. female.  HPI    Huston Foley, MD, PhD [facility] [address], [address] [address] [place], [place] [address]  Dear Amalia Hailey,   I saw your patient, Meredith Park, upon your kind request in my sleep clinic today for initial consultation of her sleep disorder, in particular, concern for underlying obstructive sleep apnea.  The patient is unaccompanied today.  As you know, [name] is a 51 year old right-handed woman with an underlying medical history of seizures, hyperlipidemia, hypertension, alcohol use disorder (in remission x nearly 3 y), and severe obesity with a BMI of over 40, who reports snoring and excessive daytime somnolence as well as poor quality sleep, not waking up rested.  She has an app that tracks her sleep and based on the app she reports loud snoring and also sleep disruption, concerning for pausing in her breathing.  She has not woken up with a sense of gasping for air and is not aware of any family history of sleep apnea, however, she does have a strong family history of early cardiac death.  Her bedtime is generally around 9 PM and rise time between 7 and 8 AM.  She works from home.  She lives alone.  She has a 42 year old daughter who is currently pregnant and due in May, her 90 year old son has mental health issues and she does have quite a bit of stress because of her son.  She has nocturia about 2-4 times per night, denies recurrent morning headaches but does have a history of migraines in the past.  She does not drink caffeine on a day-to-day basis, she has not had any alcohol for nearly 3 years, she does vape nicotine, she has not been a cigarette smoker.  She has 2 dogs and 1 cat in the household, one of the dog sleeps with her on the bed.  She is a side sleeper.  She would be willing to get tested for sleep apnea and consider CPAP therapy. I reviewed the office note from 10/10/2021.  Epworth  sleepiness score is 9 out of 24, fatigue severity score is 52 out of 63.  Her Past Medical History Is Significant For: Past Medical History:  Diagnosis Date   Alcohol abuse    Alcohol withdrawal (HCC) 08/28/2018   High cholesterol    Hypertension    Seizures (HCC)     Her Past Surgical History Is Significant For: History reviewed. No pertinent surgical history.  Her Family History Is Significant For: Family History  Problem Relation Age of Onset   Sleep apnea Neg Hx     Her Social History Is Significant For: Social History   Socioeconomic History   Marital status: Divorced    Spouse name: Not on file   Number of children: Not on file   Years of education: Not on file   Highest education level: Not on file  Occupational History   Not on file  Tobacco Use   Smoking status: Never   Smokeless tobacco: Never  Vaping Use   Vaping Use: Some days  Substance and Sexual Activity   Alcohol use: Not Currently   Drug use: Yes    Types: Marijuana   Sexual activity: Not on file  Other Topics Concern   Not on file  Social History Narrative   Not on file   Social Determinants of Health   Financial Resource Strain: Not on file  Food Insecurity: Not  on file  Transportation Needs: Not on file  Physical Activity: Not on file  Stress: Not on file  Social Connections: Not on file    Her Allergies Are:  Allergies  Allergen Reactions   Losartan Potassium-Hctz     "Out of mind"/high ammonia level   Meperidine Itching  :   Her Current Medications Are:  Outpatient Encounter Medications as of 10/25/2021  Medication Sig   carvedilol (COREG) 12.5 MG tablet Take 1 tablet (12.5 mg total) by mouth 2 (two) times daily.   Cyanocobalamin (B-12 PO) Take by mouth daily.   dimenhyDRINATE (DRAMAMINE) 50 MG tablet Take 50 mg by mouth daily as needed for nausea.   lacosamide (VIMPAT) 50 MG TABS tablet take 2 tablets (100 mg total) in the morning and 3 tablets (150 mg total) every evening.    losartan (COZAAR) 50 MG tablet Take 50 mg by mouth daily. Take only if blood pressure is over 139.   Multiple Vitamin (MULTIVITAMIN WITH MINERALS) TABS tablet Take 1 tablet by mouth daily.   SUMAtriptan (IMITREX) 100 MG tablet Take 100 mg by mouth daily as needed for migraine.   Vitamin D, Ergocalciferol, (DRISDOL) 1.25 MG (50000 UNIT) CAPS capsule Take 50,000 Units by mouth once a week. Sturdays   No facility-administered encounter medications on file as of 10/25/2021.  :   Review of Systems:  Out of a complete 14 point review of systems, all are reviewed and negative with the exception of these symptoms as listed below:   Review of Systems  Neurological:        Pt is here for sleep consult . Pt states she snores,has fatigue throughout the day, has hypertension .Pt denies sleep study,CPAP machine and headaches  ESS:9 FSS:52   Objective:  Neurological Exam  Physical Exam Physical Examination:   Vitals:   10/25/21 0802  BP: 105/73  Pulse: 81    General Examination: The patient is a very pleasant 51 y.o. female in no acute distress. She appears well-developed and well-nourished and well groomed.   HEENT: Normocephalic, atraumatic, pupils are equal, round and reactive to light, extraocular tracking is good without limitation to gaze excursion or nystagmus noted. Hearing is grossly intact. Face is symmetric with normal facial animation. Speech is clear with no dysarthria noted. There is no hypophonia. There is no lip, neck/head, jaw or voice tremor. Neck is supple with full range of passive and active motion. There are no carotid bruits on auscultation. Oropharynx exam reveals: mild mouth dryness, adequate dental hygiene and moderate airway crowding, due to small airway entry, tonsils on the smaller side, Mallampati is class III. Tongue protrudes centrally and palate elevates symmetrically.  Neck circumference of 17 1/4 inches.  Minimal overbite.  Chest: Clear to auscultation  without wheezing, rhonchi or crackles noted.  Heart: S1+S2+0, regular and normal without murmurs, rubs or gallops noted.   Abdomen: Soft, non-tender and non-distended.  Extremities: There is no pitting edema in the distal lower extremities bilaterally.   Skin: Warm and dry without trophic changes noted.   Musculoskeletal: exam reveals no obvious joint deformities.   Neurologically:  Mental status: The patient is awake, alert and oriented in all 4 spheres. Her immediate and remote memory, attention, language skills and fund of knowledge are appropriate. There is no evidence of aphasia, agnosia, apraxia or anomia. Speech is clear with normal prosody and enunciation. Thought process is linear. Mood is normal and affect is normal.  Cranial nerves II - XII are as described above  under HEENT exam.  Motor exam: Normal bulk, strength and tone is noted. There is no tremor. Fine motor skills and coordination: grossly intact.  Cerebellar testing: No dysmetria or intention tremor. There is no truncal or gait ataxia.  Sensory exam: intact to light touch in the upper and lower extremities.  Gait, station and balance: She stands easily. No veering to one side is noted. No leaning to one side is noted. Posture is age-appropriate and stance is narrow based. Gait shows normal stride length and normal pace. No problems turning are noted.   Assessment and plan:  In summary, Irini Leet is a very pleasant 51 y.o.-year old female with an underlying medical history of seizures, hyperlipidemia, hypertension, alcohol use disorder (in remission x nearly 3 y), and severe obesity with a BMI of over 40, whose history and physical exam are concerning for obstructive sleep apnea (OSA). I had a long chat with the patient about my findings and the diagnosis of OSA, its prognosis and treatment options. We talked about medical treatments, surgical interventions and non-pharmacological approaches. I explained in  particular the risks and ramifications of untreated moderate to severe OSA, especially with respect to developing cardiovascular disease down the Road, including congestive heart failure, difficult to treat hypertension, cardiac arrhythmias, or stroke. Even type 2 diabetes has, in part, been linked to untreated OSA. Symptoms of untreated OSA include daytime sleepiness, memory problems, mood irritability and mood disorder such as depression and anxiety, lack of energy, as well as recurrent headaches, especially morning headaches. We talked about nicotine cessation and trying to maintain a healthy lifestyle in general, as well as the importance of weight control. I recommended the following at this time: sleep study.  I outlined the differences between a laboratory attended sleep study versus home sleep test. I explained the sleep test procedure to the patient and also outlined possible surgical and non-surgical treatment options of OSA, including the use of a custom-made dental device (which would require a referral to a specialist dentist or oral surgeon), upper airway surgical options, such as traditional UPPP or a novel less invasive surgical option in the form of Inspire hypoglossal nerve stimulation (which would involve a referral to an ENT surgeon). I also explained the CPAP treatment option to the patient, who indicated that she would be willing to try CPAP if the need arises. I explained the importance of being compliant with PAP treatment, not only for insurance purposes but primarily to improve Her symptoms, and for the patient's long term health benefit, including to reduce Her cardiovascular risks. I answered all her questions today and the patient was in agreement. I plan to see her back after the sleep study is completed and encouraged her to call with any interim questions, concerns, problems or updates.   Thank you very much for allowing me to participate in the care of this nice patient. If I  can be of any further assistance to you please do not hesitate to call me at [phone].  Sincerely,   Huston Foley, MD, PhD

## 2021-10-25 NOTE — Patient Instructions (Signed)

## 2021-11-01 ENCOUNTER — Emergency Department (HOSPITAL_COMMUNITY)
Admission: EM | Admit: 2021-11-01 | Discharge: 2021-11-02 | Disposition: A | Discharge status: Home / Self Care | Payer: BC Managed Care – PPO | Attending: Emergency Medicine | Admitting: Emergency Medicine

## 2021-11-01 ENCOUNTER — Encounter (HOSPITAL_COMMUNITY): Payer: Self-pay

## 2021-11-01 ENCOUNTER — Other Ambulatory Visit: Payer: Self-pay

## 2021-11-01 DIAGNOSIS — Z5900 Homelessness unspecified: Secondary | ICD-10-CM | POA: Insufficient documentation

## 2021-11-01 DIAGNOSIS — R569 Unspecified convulsions: Secondary | ICD-10-CM | POA: Diagnosis not present

## 2021-11-01 DIAGNOSIS — Z79899 Other long term (current) drug therapy: Secondary | ICD-10-CM | POA: Insufficient documentation

## 2021-11-01 DIAGNOSIS — I10 Essential (primary) hypertension: Secondary | ICD-10-CM | POA: Diagnosis not present

## 2021-11-01 DIAGNOSIS — Y900 Blood alcohol level of less than 20 mg/100 ml: Secondary | ICD-10-CM | POA: Diagnosis not present

## 2021-11-01 LAB — COMPREHENSIVE METABOLIC PANEL
ALT: 80 U/L — ABNORMAL HIGH (ref 0–44)
AST: 36 U/L (ref 15–41)
Albumin: 4.1 g/dL (ref 3.5–5.0)
Alkaline Phosphatase: 85 U/L (ref 38–126)
Anion gap: 9 (ref 5–15)
BUN: 10 mg/dL (ref 6–20)
CO2: 22 mmol/L (ref 22–32)
Calcium: 9 mg/dL (ref 8.9–10.3)
Chloride: 105 mmol/L (ref 98–111)
Creatinine, Ser: 0.84 mg/dL (ref 0.44–1.00)
GFR, Estimated: >60 mL/min (ref >60)
Glucose, Bld: 131 mg/dL — ABNORMAL HIGH (ref 70–99)
Potassium: 3.9 mmol/L (ref 3.5–5.1)
Sodium: 136 mmol/L (ref 135–145)
Total Bilirubin: 0.5 mg/dL (ref 0.3–1.2)
Total Protein: 6.6 g/dL (ref 6.5–8.1)

## 2021-11-01 LAB — URINALYSIS, ROUTINE W REFLEX MICROSCOPIC
Bilirubin Urine: NEGATIVE
Glucose, UA: NEGATIVE mg/dL
Hgb urine dipstick: NEGATIVE
Ketones, ur: NEGATIVE mg/dL
Leukocytes,Ua: NEGATIVE
Nitrite: NEGATIVE
Protein, ur: NEGATIVE mg/dL
Specific Gravity, Urine: 1.008 (ref 1.005–1.030)
pH: 7 (ref 5.0–8.0)

## 2021-11-01 LAB — CBC WITH DIFFERENTIAL/PLATELET
Abs Immature Granulocytes: 0.01 10*3/uL (ref 0.00–0.07)
Basophils Absolute: 0 10*3/uL (ref 0.0–0.1)
Basophils Relative: 0 %
Eosinophils Absolute: 0.1 10*3/uL (ref 0.0–0.5)
Eosinophils Relative: 1 %
HCT: 40 % (ref 36.0–46.0)
Hemoglobin: 13 g/dL (ref 12.0–15.0)
Immature Granulocytes: 0 %
Lymphocytes Relative: 18 %
Lymphs Abs: 1.3 10*3/uL (ref 0.7–4.0)
MCH: 27.8 pg (ref 26.0–34.0)
MCHC: 32.5 g/dL (ref 30.0–36.0)
MCV: 85.5 fL (ref 80.0–100.0)
Monocytes Absolute: 0.4 10*3/uL (ref 0.1–1.0)
Monocytes Relative: 5 %
Neutro Abs: 5.6 10*3/uL (ref 1.7–7.7)
Neutrophils Relative %: 76 %
Platelets: 208 10*3/uL (ref 150–400)
RBC: 4.68 MIL/uL (ref 3.87–5.11)
RDW: 13.7 % (ref 11.5–15.5)
WBC: 7.4 10*3/uL (ref 4.0–10.5)
nRBC: 0 % (ref 0.0–0.2)

## 2021-11-01 LAB — ETHANOL: Alcohol, Ethyl (B): <10 mg/dL (ref <10)

## 2021-11-01 LAB — AMMONIA: Ammonia: 29 umol/L (ref 9–35)

## 2021-11-01 NOTE — ED Provider Triage Note (Signed)
Emergency Medicine Provider Triage Evaluation Note  Meredith Park , a 51 y.o. female  was evaluated in triage.  Pt complains of seizures.  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  Reports over the past 2 days she thinks she has had absence seizure's.  She reports these typically build up and then she has grand mal seizure.  No witnessed grand mal seizure activity but her daughter reports she has definitely had at least one absence seizure.  She reports that this time she just feels foggy.  She reports she was admitted for this once before and at that time she was told she also had high ammonia levels, history of alcohol abuse but has been sober for the past 3 years.  Followed by Dr. April Manson at [facility] neurology.  Work-up thus far has not showed a clear etiology for her seizure episodes, Meredith Park had recommended that if she continues having episodes she may need admission to [facility] for extended monitoring.  Currently reports very mild headache and just feeling a bit fatigued and foggy.  EMS reports mental status was initially slow but has improved in transit.  Review of Systems  Positive: Seizures, foggy headed, mild headache Negative: Visual changes, fevers, chest pain, shortness of breath, abdominal pain  Physical Exam  BP (!) 160/99    Pulse 75    Temp 98.1 F (36.7 C) (Oral)    Resp 16    LMP 08/28/2020    SpO2 97%  Gen:   Awake, no distress   Resp:  Normal effort  MSK:   Moves extremities without difficulty  Other:  No focal neurodeficits  Medical Decision Making  Medically screening exam initiated at 10:11 PM.  Appropriate orders placed.  Meredith Park was informed that the remainder of the evaluation will be completed by another provider, this initial triage assessment does not replace that evaluation, and the importance of remaining in the ED until their evaluation is complete.     Meredith Park, [place] 11/01/21 2217

## 2021-11-01 NOTE — ED Provider Notes (Signed)
[facility] EMERGENCY DEPARTMENT Provider Note  CSN: [id number] Arrival date & time: 11/01/21 2154  Chief Complaint(s) Seizures  HPI Meredith Park is a 51 y.o. female with past medical history listed below including seizures on Vimpat, being followed by [facility] neurology.  She presents for absence seizure while on the phone with her daughter.  Reports that she feels foggy headed and at times is forgetful.  Daughter reports repeated statements. These are typical seizures for the patient.  Typically last for 10 to 15 minutes.  Today it lasted 15-20 mins.  States that she might have had 2-3 earlier in the day as well. Patient is now back to her baseline mental status  Patient has a history of prior alcohol abuse but has been sober for the past 3 years.  Does admit to eating marijuana cookies at night to help her sleep.  She has been compliant with her medication.  Denies any recent fevers or infections.  Reports that she has been under a lot of stress recently related to her schizophrenic son dealing with drug use and homelessness.   On review of records, the note from neurology on February 6 noted that the seizures are at times related to increased stress.  She was admitted for prolonged EEG after altered mental status state which did not show any epileptic pattern.  There is still try to work her up for epilepsy versus nonepileptic seizures.  Neurology recommended EMU stay if the episodes continue. Her Vimpat level WNL  During prior admission patient had elevated ammonia level in June 2022. Prior PCP clinic notes noted that she was on Imitrex for migraines.  The history is provided by the patient and a relative.   Past Medical History Past Medical History:  Diagnosis Date   Alcohol abuse    Alcohol withdrawal ([place]) 08/28/2018   High cholesterol    Hypertension    Seizures ([place])    Patient Active Problem List   Diagnosis Date Noted   Hypokalemia 09/30/2021    Cannabis abuse-Urine drug screen +ve on 1/26 09/30/2021   Seizure ([place]) 09/29/2021   Alcohol dependence in remission ([place]) 04/12/2018   Adjustment disorder with mixed anxiety and depressed mood 08/26/2017   Essential (primary) hypertension 08/26/2017   Mixed hyperlipidemia 08/26/2017   Home Medication(s) Prior to Admission medications   Medication Sig Start Date End Date Taking? Authorizing Provider  carvedilol (COREG) 12.5 MG tablet Take 1 tablet (12.5 mg total) by mouth 2 (two) times daily. 08/24/17   Domenic Moras, PA-C  Cyanocobalamin (B-12 PO) Take by mouth daily.    [provider]  dimenhyDRINATE (DRAMAMINE) 50 MG tablet Take 50 mg by mouth daily as needed for nausea.    [provider]  lacosamide (VIMPAT) 50 MG TABS tablet take 2 tablets (100 mg total) in the morning and 3 tablets (150 mg total) every evening. 10/03/21 12/02/21  Thurnell Lose, MD  losartan (COZAAR) 50 MG tablet Take 50 mg by mouth daily. Take only if blood pressure is over 139. 07/12/21   [provider]  Multiple Vitamin (MULTIVITAMIN WITH MINERALS) TABS tablet Take 1 tablet by mouth daily. 08/30/18   Aline August, MD  SUMAtriptan (IMITREX) 100 MG tablet Take 100 mg by mouth daily as needed for migraine. 03/30/17   [provider]  Vitamin D, Ergocalciferol, (DRISDOL) 1.25 MG (50000 UNIT) CAPS capsule Take 50,000 Units by mouth once a week. Sturdays 09/03/21   [provider]  Allergies Losartan potassium-hctz and Meperidine  Review of Systems Review of Systems As noted in HPI  Physical Exam Vital Signs  I have reviewed the triage vital signs Emergency department women please Purcell Nails bring him atrophy BP 127/85    Pulse 66    Temp 98.1 F (36.7 C) (Oral)    Resp 19    Ht 5\' 3"  (1.6 m)    Wt 108 kg    LMP 08/28/2020    SpO2 99%    BMI  42.18 kg/m   Physical Exam Vitals reviewed.  Constitutional:      General: She is not in acute distress.    Appearance: She is well-developed. She is not diaphoretic.  HENT:     Head: Normocephalic and atraumatic.     Nose: Nose normal.  Eyes:     General: No scleral icterus.       Right eye: No discharge.        Left eye: No discharge.     Conjunctiva/sclera: Conjunctivae normal.     Pupils: Pupils are equal, round, and reactive to light.  Cardiovascular:     Rate and Rhythm: Normal rate and regular rhythm.     Heart sounds: No murmur heard.   No friction rub. No gallop.  Pulmonary:     Effort: Pulmonary effort is normal. No respiratory distress.     Breath sounds: Normal breath sounds. No stridor. No rales.  Abdominal:     General: There is no distension.     Palpations: Abdomen is soft.     Tenderness: There is no abdominal tenderness.  Musculoskeletal:        General: No tenderness.     Cervical back: Normal range of motion and neck supple.  Skin:    General: Skin is warm and dry.     Findings: No erythema or rash.  Neurological:     Mental Status: She is alert and oriented to person, place, and time.     Comments: Mental Status:  Alert and oriented to person, place, and time.  Attention and concentration normal.  Speech clear.  Recent memory is intact  Cranial Nerves:  II Visual Fields: Intact to confrontation. Visual fields intact. III, IV, VI: Pupils equal and reactive to light and near. Full eye movement without nystagmus  V Facial Sensation: Normal. No weakness of masticatory muscles  VII: No facial weakness or asymmetry  VIII Auditory Acuity: Grossly normal  IX/X: The uvula is midline; the palate elevates symmetrically  XI: Normal sternocleidomastoid and trapezius strength  XII: The tongue is midline. No atrophy or fasciculations.   Motor System: Muscle Strength: 5/5 and symmetric in the upper and lower extremities. No pronation or drift.  Muscle Tone:  Tone and muscle bulk are normal in the upper and lower extremities.  Coordination: Intact finger-to-nose. No tremor.  Sensation: Intact to light touch. Gait: Routine gait normal.     ED Results and Treatments Labs (all labs ordered are listed, but only abnormal results are displayed) Labs Reviewed  COMPREHENSIVE METABOLIC PANEL - Abnormal; Notable for the following components:      Result Value   Glucose, Bld 131 (*)    ALT 80 (*)    All other components within normal limits  URINALYSIS, ROUTINE W REFLEX MICROSCOPIC - Abnormal; Notable for the following components:   APPearance HAZY (*)    All other components within normal limits  CBC WITH DIFFERENTIAL/PLATELET  AMMONIA  ETHANOL  RAPID URINE DRUG SCREEN, HOSP PERFORMED  EKG  EKG Interpretation  Date/Time:  Tuesday November 01 2021 22:04:34 EST Ventricular Rate:  78 PR Interval:  146 QRS Duration: 90 QT Interval:  374 QTC Calculation: 426 R Axis:   31 Text Interpretation: Sinus rhythm with Premature atrial complexes Otherwise normal ECG When compared with ECG of 02-Oct-2021 10:03, PREVIOUS ECG IS PRESENT Confirmed by Addison Lank [phone]) on 11/02/2021 12:39:29 AM       Radiology No results found.  Pertinent labs & imaging results that were available during my care of the patient were reviewed by me and considered in my medical decision making (see MDM for details).  Medications Ordered in ED Medications - No data to display                                                                                                                                   Procedures Procedures  (including critical care time)  Medical Decision Making / ED Course    Complexity of Problem:  Co morbidities that complicate the patient evaluation Adjustment disorder, anxiety and depression Migraines   Additional  history obtained: Daughter Record review noted in HPI  Patient's presenting problem/concern and DDX listed below: Absence seizure Epilepsy versus nonepileptic. Possible complex migraine headaches.  We will assess for any electrolyte derangements evidence of occult urinary tract infection.    Complexity of Data:   Cardiac Monitoring: The patient was maintained on a cardiac monitor.   I personally viewed and interpreted the cardiac monitored which showed an underlying rhythm of normal sinus rhythm with a rate in the 60s to 70s.  Infrequent PACs.   Laboratory Tests ordered listed below with my independent interpretation: CBC without leukocytosis or anemia CMP without significant electrolyte derangements or renal sufficiency. LFTs reassuring. Ammonia level within normal limits EtOH negative Urine without evidence of infection     Imaging Studies ordered listed below with my independent interpretation: Considered obtaining a CT head but on review of records, patient had a CT head obtained in January of this year that was negative for any mass effect.      ED Course:    Based on above concerns, hospitalization possible:  no  Assessment, Intervention, and Reassessment: Possible seizure No evidence of metabolic or electrolytes derangements.  No infections. Monitored for 3 hours w/o recurrence. Intervention at this time. Consulted and spoke with Dr. Theda Sers from our neurology service to discuss case.  He agreed the patient did not require admission at this time any further work-up, repeated imaging, or medication adjustment needed. I will reach out to the patient's neurologist to inform him of her visit today so they can schedule her EMU.   Final Clinical Impression(s) / ED Diagnoses Final diagnoses:  Seizure-like activity ([place])   The patient appears reasonably screened and/or stabilized for discharge and I doubt any other medical condition or other [facility] requiring further  screening, evaluation, or treatment in the ED at this time  prior to discharge. Safe for discharge with strict return precautions.  Disposition: Discharge  Condition: Good  I have discussed the results, Dx and Tx plan with the patient/family who expressed understanding and agree(s) with the plan. Discharge instructions discussed at length. The patient/family was given strict return precautions who verbalized understanding of the instructions. No further questions at time of discharge.    ED Discharge Orders     None        Follow Up: Mariel Sleet [id number] [place] [address] [phone]  Call  as needed  Alric Ran, [place] [address] [phone]  Call  if you have not been called about your appointment           This chart was dictated using voice recognition software.  Despite best efforts to proofread,  errors can occur which can change the documentation meaning.    Fatima Blank, MD 11/02/21 [phone]

## 2021-11-01 NOTE — ED Triage Notes (Signed)
Pt presents to the ED from home via [facility] with complaints of seizures. Daughter called EMS after pt had absent seizure at home, had several over the last few days. HX of same with grand mal after multiple absent seizures. EMS states she was initially disoriented but now a/ox4.

## 2021-11-02 ENCOUNTER — Other Ambulatory Visit: Payer: Self-pay | Admitting: Neurology

## 2021-11-02 NOTE — ED Notes (Signed)
Discharge instructions reviewed with patient and family. Patient and family verbalized understanding of instructions. Follow-up care and medications were reviewed. Patient ambulatory with steady gait. VSS upon discharge.  °

## 2021-11-10 ENCOUNTER — Other Ambulatory Visit (HOSPITAL_BASED_OUTPATIENT_CLINIC_OR_DEPARTMENT_OTHER): Payer: Self-pay

## 2021-11-10 ENCOUNTER — Other Ambulatory Visit: Payer: Self-pay | Admitting: Neurology

## 2021-11-10 MED ORDER — LACOSAMIDE 50 MG PO TABS: ORAL_TABLET | ORAL | 5 refills | Status: DC

## 2021-11-10 NOTE — Telephone Encounter (Signed)
Pt is requesting a refill for lacosamide (VIMPAT) 50 MG TABS tablet . ? ?Pharmacy: [facility] [phone] ? ?

## 2021-11-17 ENCOUNTER — Encounter: Payer: Self-pay | Admitting: Neurology

## 2021-11-28 ENCOUNTER — Ambulatory Visit (INDEPENDENT_AMBULATORY_CARE_PROVIDER_SITE_OTHER): Payer: BC Managed Care – PPO | Admitting: Neurology

## 2021-11-28 DIAGNOSIS — G4733 Obstructive sleep apnea (adult) (pediatric): Secondary | ICD-10-CM

## 2021-11-28 DIAGNOSIS — R0683 Snoring: Secondary | ICD-10-CM

## 2021-11-28 DIAGNOSIS — G478 Other sleep disorders: Secondary | ICD-10-CM

## 2021-11-28 DIAGNOSIS — G4719 Other hypersomnia: Secondary | ICD-10-CM

## 2021-11-28 DIAGNOSIS — R351 Nocturia: Secondary | ICD-10-CM

## 2021-11-28 DIAGNOSIS — G40909 Epilepsy, unspecified, not intractable, without status epilepticus: Secondary | ICD-10-CM

## 2021-11-30 ENCOUNTER — Ambulatory Visit: Payer: BC Managed Care – PPO | Admitting: Neurology

## 2021-11-30 NOTE — Procedures (Signed)
?  ? ?  [facility] ? ?HOME SLEEP TEST (Watch PAT) REPORT ? ?STUDY DATE: 11/28/2021 ? ?DOB: 1971-08-11 ? ?MRN: [id number] ? ?ORDERING CLINICIAN: Huston Foley, MD, PhD ?  ?REFERRING CLINICIAN: Dr. Teresa Coombs ? ?CLINICAL INFORMATION/HISTORY: 51 year old right-handed woman with an underlying medical history of seizures, hyperlipidemia, hypertension, alcohol use disorder (in remission x nearly 3 y), and severe obesity with a BMI of over 40, who reports snoring and excessive daytime somnolence as well as poor quality sleep, not waking up rested.   ? ?Epworth sleepiness score: 9/24. ? ?BMI: 42.6 kg/m? ? ?FINDINGS:  ? ?Sleep Summary:  ? ?Total Recording Time (hours, min): 9 hours, 59 minutes ? ?Total Sleep Time (hours, min):  7 hours, 40 minutes  ? ?Percent REM (%):    24.4%  ? ?Respiratory Indices:  ? ?Calculated pAHI (per hour):  46.4/hour        ? ?REM pAHI:    59.8/hour      ? ?NREM pAHI: 42.3/hour ? ?Oxygen Saturation Statistics:  ?  ?Oxygen Saturation (%) Mean: 94%  ? ?Minimum oxygen saturation (%):                 80%  ? ?O2 Saturation Range (%): 80-99%   ? ?O2 Saturation (minutes) <=88%: 1.6 min ? ?Pulse Rate Statistics:  ? ?Pulse Mean (bpm):    71/min   ? ?Pulse Range (43-121/min)  ? ?IMPRESSION: OSA (obstructive sleep apnea), severe ? ?RECOMMENDATION:  ?This home sleep test demonstrates severe obstructive sleep apnea with a total AHI of 46.4/hour and O2 nadir of 80%.  Moderate to loud snoring was detected throughout the night.  Treatment with positive airway pressure is highly recommended. This will require - ideally - a full night CPAP titration study for proper treatment settings, O2 monitoring and mask fitting. For now, the patient will be advised to proceed with an autoPAP titration/trial at home.  A laboratory attended titration study can be considered in the future for optimization of her treatment and better tolerance of therapy.  Alternative treatment options are limited secondary to the severity  of the patient's sleep disordered breathing.  Concomitant weight loss is highly recommended. Please note, that untreated obstructive sleep apnea may carry additional perioperative morbidity. Patients with significant obstructive sleep apnea should receive perioperative PAP therapy and the surgeons and particularly the anesthesiologist should be informed of the diagnosis and the severity of the sleep disordered breathing. ?The patient should be cautioned not to drive, work at heights, or operate dangerous or heavy equipment when tired or sleepy. Review and reiteration of good sleep hygiene measures should be pursued with any patient. Other causes of the patient's symptoms, including circadian rhythm disturbances, an underlying mood disorder, medication effect and/or an underlying medical problem cannot be ruled out based on this test. Clinical correlation is recommended. The patient and his referring provider will be notified of the test results. The patient will be seen in follow up in sleep clinic at [facility]. ? ?I certify that I have reviewed the raw data recording prior to the issuance of this report in accordance with the standards of the American Academy of Sleep Medicine (AASM). ? ?INTERPRETING PHYSICIAN:  ? ?Huston Foley, MD, PhD  ?Board Certified in Neurology and Sleep Medicine ? ?[facility] ?[address], [address] ?[place], [place] [address] ?([phone] ? ? ? ? ? ? ? ? ? ? ? ? ? ? ? ?

## 2021-11-30 NOTE — Addendum Note (Signed)
Addended by: Huston Foley on: 11/30/2021 05:19 PM ? ? Modules accepted: Orders ? ?

## 2021-11-30 NOTE — Progress Notes (Signed)
See procedure note.

## 2021-12-01 ENCOUNTER — Telehealth: Payer: Self-pay | Admitting: *Deleted

## 2021-12-01 NOTE — Telephone Encounter (Signed)
Spoke with patient we did the results of her home sleep test which showed obstructive sleep apnea in the severe range.  Patient aware the recommendation for this is in the form of AutoPap.  We discussed the difference between AutoPap and CPAP.  Patient is amenable to proceeding with this.  She understands the insurance compliance requirements which includes using the machine for at least 4 hours at night, however our goal for her is to use the machine all night long.  Patient also aware of insurance requirement to have an initial follow-up appointment between 31 and 90 days after set up.  We scheduled this appointment with Shanda Bumps NP for Wednesday, June 7 at 3:45 PM arrival 15 to 30 minutes early with machine and power cord.  Patient's questions were answered.  She may have further questions regarding her conditions that she will ask in a follow-up appointment.  Patient was very appreciative for the call.  She would like to follow-up letter to come to my chart.  DME will be [facility].  Patient did not have a preference. She will watch for a call from them. ? ?Order, Furniture conservator/restorer, office note, sleep study result all faxed to [facility]. Received a receipt of confirmation. Report sent to referring MD. Letter sent to pt.  ?

## 2021-12-01 NOTE — Telephone Encounter (Signed)
-----   Message from Huston Foley, MD sent at 11/30/2021  5:19 PM EDT ----- ?Patient referred by Dr. Teresa Coombs, seen by me on 10/25/2021, patient had a HST on 11/28/2021.   ? ?Please call and notify the patient that the recent home sleep test showed obstructive sleep apnea in the severe range. I recommend treatment for this in the form of autoPAP, which means, that we don't have to bring her in for a sleep study with CPAP, but will let her start using a so called autoPAP machine at home, through a DME company (of her choice, or as per insurance requirement). The DME representative will fit the patient with a mask of choice, educate her on how to use the machine, how to put the mask on, etc. I have placed an order in the chart. Please send the order to a local DME, talk to patient, send report to referring MD. Please also reinforce the need for compliance with treatment. We will need a FU in sleep clinic for 10 weeks post-PAP set up, please arrange that with me or one of our NPs. Thanks,  ? ?Huston Foley, MD, PhD ?[facility]) ? ? ? ? ?

## 2021-12-11 ENCOUNTER — Encounter (HOSPITAL_COMMUNITY): Payer: Self-pay

## 2021-12-11 ENCOUNTER — Emergency Department (HOSPITAL_COMMUNITY): Payer: BC Managed Care – PPO

## 2021-12-11 ENCOUNTER — Emergency Department (HOSPITAL_COMMUNITY)
Admission: EM | Admit: 2021-12-11 | Discharge: 2021-12-11 | Disposition: A | Discharge status: Home / Self Care | Payer: BC Managed Care – PPO | Attending: Emergency Medicine | Admitting: Emergency Medicine

## 2021-12-11 ENCOUNTER — Other Ambulatory Visit: Payer: Self-pay

## 2021-12-11 DIAGNOSIS — T148XXA Other injury of unspecified body region, initial encounter: Secondary | ICD-10-CM

## 2021-12-11 DIAGNOSIS — S0083XA Contusion of other part of head, initial encounter: Secondary | ICD-10-CM | POA: Diagnosis not present

## 2021-12-11 DIAGNOSIS — I10 Essential (primary) hypertension: Secondary | ICD-10-CM | POA: Insufficient documentation

## 2021-12-11 DIAGNOSIS — W19XXXA Unspecified fall, initial encounter: Secondary | ICD-10-CM | POA: Diagnosis not present

## 2021-12-11 DIAGNOSIS — Z79899 Other long term (current) drug therapy: Secondary | ICD-10-CM | POA: Insufficient documentation

## 2021-12-11 DIAGNOSIS — R519 Headache, unspecified: Secondary | ICD-10-CM

## 2021-12-11 DIAGNOSIS — G40909 Epilepsy, unspecified, not intractable, without status epilepticus: Secondary | ICD-10-CM | POA: Diagnosis not present

## 2021-12-11 DIAGNOSIS — S0990XA Unspecified injury of head, initial encounter: Secondary | ICD-10-CM | POA: Diagnosis present

## 2021-12-11 LAB — CBC WITH DIFFERENTIAL/PLATELET
Abs Immature Granulocytes: 0.09 10*3/uL — ABNORMAL HIGH (ref 0.00–0.07)
Basophils Absolute: 0 10*3/uL (ref 0.0–0.1)
Basophils Relative: 0 %
Eosinophils Absolute: 0 10*3/uL (ref 0.0–0.5)
Eosinophils Relative: 0 %
HCT: 43 % (ref 36.0–46.0)
Hemoglobin: 14.3 g/dL (ref 12.0–15.0)
Immature Granulocytes: 1 %
Lymphocytes Relative: 5 %
Lymphs Abs: 0.8 10*3/uL (ref 0.7–4.0)
MCH: 28.1 pg (ref 26.0–34.0)
MCHC: 33.3 g/dL (ref 30.0–36.0)
MCV: 84.5 fL (ref 80.0–100.0)
Monocytes Absolute: 0.5 10*3/uL (ref 0.1–1.0)
Monocytes Relative: 3 %
Neutro Abs: 14.3 10*3/uL — ABNORMAL HIGH (ref 1.7–7.7)
Neutrophils Relative %: 91 %
Platelets: 188 10*3/uL (ref 150–400)
RBC: 5.09 MIL/uL (ref 3.87–5.11)
RDW: 13.5 % (ref 11.5–15.5)
WBC: 15.7 10*3/uL — ABNORMAL HIGH (ref 4.0–10.5)
nRBC: 0 % (ref 0.0–0.2)

## 2021-12-11 LAB — COMPREHENSIVE METABOLIC PANEL
ALT: 56 U/L — ABNORMAL HIGH (ref 0–44)
AST: 36 U/L (ref 15–41)
Albumin: 4.5 g/dL (ref 3.5–5.0)
Alkaline Phosphatase: 83 U/L (ref 38–126)
Anion gap: 10 (ref 5–15)
BUN: 10 mg/dL (ref 6–20)
CO2: 21 mmol/L — ABNORMAL LOW (ref 22–32)
Calcium: 9.2 mg/dL (ref 8.9–10.3)
Chloride: 107 mmol/L (ref 98–111)
Creatinine, Ser: 1 mg/dL (ref 0.44–1.00)
GFR, Estimated: >60 mL/min (ref >60)
Glucose, Bld: 119 mg/dL — ABNORMAL HIGH (ref 70–99)
Potassium: 4.3 mmol/L (ref 3.5–5.1)
Sodium: 138 mmol/L (ref 135–145)
Total Bilirubin: 1 mg/dL (ref 0.3–1.2)
Total Protein: 7 g/dL (ref 6.5–8.1)

## 2021-12-11 LAB — URINALYSIS, ROUTINE W REFLEX MICROSCOPIC
Bilirubin Urine: NEGATIVE
Glucose, UA: NEGATIVE mg/dL
Hgb urine dipstick: NEGATIVE
Ketones, ur: NEGATIVE mg/dL
Leukocytes,Ua: NEGATIVE
Nitrite: NEGATIVE
Protein, ur: NEGATIVE mg/dL
Specific Gravity, Urine: 1.011 (ref 1.005–1.030)
pH: 7 (ref 5.0–8.0)

## 2021-12-11 LAB — CBG MONITORING, ED: Glucose-Capillary: 144 mg/dL — ABNORMAL HIGH (ref 70–99)

## 2021-12-11 MED ORDER — SUMATRIPTAN SUCCINATE 100 MG PO TABS: 100.0000 mg | ORAL_TABLET | Freq: Every day | ORAL | 0 refills | Status: DC | PRN

## 2021-12-11 MED ORDER — PROCHLORPERAZINE EDISYLATE 10 MG/2ML IJ SOLN
10.0000 mg | Freq: Once | INTRAMUSCULAR | Status: AC
  Administered 2021-12-11: 10 mg via INTRAVENOUS
  Filled 2021-12-11: qty 2

## 2021-12-11 MED ORDER — LACOSAMIDE 50 MG PO TABS: ORAL_TABLET | ORAL | 5 refills | Status: DC

## 2021-12-11 MED ORDER — DIPHENHYDRAMINE HCL 50 MG/ML IJ SOLN
25.0000 mg | Freq: Once | INTRAMUSCULAR | Status: AC
Start: 2021-12-11 — End: 2021-12-11
  Administered 2021-12-11: 25 mg via INTRAVENOUS
  Filled 2021-12-11: qty 1

## 2021-12-11 MED ORDER — HALOPERIDOL LACTATE 5 MG/ML IJ SOLN
3.0000 mg | Freq: Once | INTRAMUSCULAR | Status: AC
Start: 2021-12-11 — End: 2021-12-11
  Administered 2021-12-11: 3 mg via INTRAVENOUS
  Filled 2021-12-11: qty 1

## 2021-12-11 MED ORDER — LEVETIRACETAM IN NACL 1000 MG/100ML IV SOLN
1000.0000 mg | Freq: Once | INTRAVENOUS | Status: AC
  Administered 2021-12-11: 1000 mg via INTRAVENOUS
  Filled 2021-12-11: qty 100

## 2021-12-11 MED ORDER — MAGNESIUM SULFATE 2 GM/50ML IV SOLN
2.0000 g | Freq: Once | INTRAVENOUS | Status: AC
  Administered 2021-12-11: 2 g via INTRAVENOUS
  Filled 2021-12-11: qty 50

## 2021-12-11 NOTE — ED Notes (Signed)
Patient transported to CT 

## 2021-12-11 NOTE — ED Triage Notes (Signed)
Pt BIB [facility] from home d/t reporting she had a seizure last night. Pt has Hx of seizures & denies missing any meds. Unable to recall anything about where she was at when the seizure started, but she does have a bump on her head from the fall & endorses n/v. Her last seizure was in January & has seen a neurologist since then. 138/98, 80 bpm, NSR, 100% on RA, CBG 130. 20g PIV in Lt hand, was given 4 mg zofran.  ?

## 2021-12-11 NOTE — ED Provider Notes (Signed)
?[facility] EMERGENCY DEPARTMENT ?Provider Note ? ? ?CSN: [id number] ?Arrival date & time: 12/11/21  1141 ? ?  ? ?History ? ?Chief Complaint  ?Patient presents with  ? Seizures  ? ? ?Nakyiah Kuck is a 51 y.o. female. ? ?HPI ? ?  ? ?51 year old female with a history of hypertension, seizures, history of alcohol abuse and withdrawal who has been sober, presents with concern for seizure, nausea and vomiting. ? ? ?Sober for 3 years per patient ?On vimpat ?Last night between 9-10PM, has hematoma to forehead, doesn't remember, can usually tell if she has a seizure, thought she did ?Woke up at 10AM today, nausea, severe headache different than prior postictal symptoms.  Hx of migraines, had been imporved since stopping etoh. Ran out of carvedilol 4 days ago, but otherwise taking seizure medications. Last seizure was January when she was seen here and admitted. Total of 5 seizures.  Zofran via EMS for n/v. Not on anticoagulation.   ?Past Medical History:  ?Diagnosis Date  ? Alcohol abuse   ? Alcohol withdrawal (HCC) 08/28/2018  ? High cholesterol   ? Hypertension   ? Seizures (HCC)   ?  ? ?Home Medications ?Prior to Admission medications   ?Medication Sig Start Date End Date Taking? Authorizing Provider  ?carvedilol (COREG) 12.5 MG tablet Take 1 tablet (12.5 mg total) by mouth 2 (two) times daily. 08/24/17  Yes Fayrene Helper, PA-C  ?Cyanocobalamin (B-12 PO) Take by mouth daily.   Yes [provider]  ?losartan (COZAAR) 50 MG tablet Take 50 mg by mouth daily. Take only if blood pressure is over 139. 07/12/21  Yes [provider]  ?Multiple Vitamin (MULTIVITAMIN WITH MINERALS) TABS tablet Take 1 tablet by mouth daily. 08/30/18  Yes Glade Lloyd, MD  ?Vitamin D, Ergocalciferol, (DRISDOL) 1.25 MG (50000 UNIT) CAPS capsule Take 50,000 Units by mouth once a week. Tuesdays 09/03/21  Yes [provider]  ?lacosamide (VIMPAT) 50 MG TABS tablet take 3 tablets (150 mg total) in the  morning and 3 tablets (150 mg total) every evening. 12/11/21   Alvira Monday, MD  ?SUMAtriptan (IMITREX) 100 MG tablet Take 1 tablet (100 mg total) by mouth daily as needed for migraine. 12/11/21   Alvira Monday, MD  ?   ? ?Allergies    ?Losartan potassium-hctz and Meperidine   ? ?Review of Systems   ?Review of Systems ?See above ?Physical Exam ?Updated Vital Signs ?BP (!) 150/93   Pulse 82   Temp 98.6 ?F (37 ?C)   Resp 14   LMP 08/28/2020   SpO2 97%  ?Physical Exam ?Vitals and nursing note reviewed.  ?Constitutional:   ?   General: She is not in acute distress. ?   Appearance: She is well-developed. She is not diaphoretic.  ?HENT:  ?   Head: Normocephalic and atraumatic.  ?Eyes:  ?   Conjunctiva/sclera: Conjunctivae normal.  ?Cardiovascular:  ?   Rate and Rhythm: Normal rate and regular rhythm.  ?   Heart sounds: Normal heart sounds. No murmur heard. ?  No friction rub. No gallop.  ?Pulmonary:  ?   Effort: Pulmonary effort is normal. No respiratory distress.  ?   Breath sounds: Normal breath sounds. No wheezing or rales.  ?Abdominal:  ?   General: There is no distension.  ?   Palpations: Abdomen is soft.  ?   Tenderness: There is no abdominal tenderness. There is no guarding.  ?Musculoskeletal:     ?   General: No  tenderness.  ?   Cervical back: Normal range of motion.  ?Skin: ?   General: Skin is warm and dry.  ?   Findings: No erythema or rash.  ?Neurological:  ?   Mental Status: She is alert and oriented to person, place, and time.  ?   GCS: GCS eye subscore is 4. GCS verbal subscore is 5. GCS motor subscore is 6.  ?   Motor: No weakness.  ? ? ?ED Results / Procedures / Treatments   ?Labs ?(all labs ordered are listed, but only abnormal results are displayed) ?Labs Reviewed  ?CBC WITH DIFFERENTIAL/PLATELET - Abnormal; Notable for the following components:  ?    Result Value  ? WBC 15.7 (*)   ? Neutro Abs 14.3 (*)   ? Abs Immature Granulocytes 0.09 (*)   ? All other components within normal limits   ?COMPREHENSIVE METABOLIC PANEL - Abnormal; Notable for the following components:  ? CO2 21 (*)   ? Glucose, Bld 119 (*)   ? ALT 56 (*)   ? All other components within normal limits  ?URINALYSIS, ROUTINE W REFLEX MICROSCOPIC - Abnormal; Notable for the following components:  ? APPearance HAZY (*)   ? All other components within normal limits  ?CBG MONITORING, ED - Abnormal; Notable for the following components:  ? Glucose-Capillary 144 (*)   ? All other components within normal limits  ?LACOSAMIDE  ? ? ?EKG ?EKG Interpretation ? ?Date/Time:  Sunday December 11 2021 11:49:13 EDT ?Ventricular Rate:  79 ?PR Interval:  142 ?QRS Duration: 93 ?QT Interval:  380 ?QTC Calculation: 436 ?R Axis:   -9 ?Text Interpretation: Sinus rhythm Atrial premature complex Low voltage, precordial leads No significant change since last tracing Confirmed by Alvira MondaySchlossman, Sivan Cuello ([id number]) on 12/11/2021 11:56:52 AM ? ?Radiology ?CT Head Wo Contrast ? ?Result Date: 12/11/2021 ?CLINICAL DATA:  Head trauma, coagulopathy. EXAM: CT HEAD WITHOUT CONTRAST TECHNIQUE: Contiguous axial images were obtained from the base of the skull through the vertex without intravenous contrast. RADIATION DOSE REDUCTION: This exam was performed according to the departmental dose-optimization program which includes automated exposure control, adjustment of the mA and/or kV according to patient size and/or use of iterative reconstruction technique. COMPARISON:  Head CT dated 09/29/2021. FINDINGS: Brain: Ventricles are stable in size and configuration. No mass, hemorrhage, edema or other evidence of acute parenchymal abnormality. No extra-axial hemorrhage. Vascular: Chronic calcified atherosclerotic changes of the large vessels at the skull base. No unexpected hyperdense vessel. Skull: Normal. Negative for fracture or focal lesion. Sinuses/Orbits: No acute finding. Other: None. IMPRESSION: No acute findings. No intracranial mass, hemorrhage or edema. No skull fracture. Electronically  Signed   By: Bary RichardStan  Maynard M.D.   On: 12/11/2021 12:59   ? ?Procedures ?Procedures  ? ? ?Medications Ordered in ED ?Medications  ?levETIRAcetam (KEPPRA) IVPB 1000 mg/100 mL premix (0 mg Intravenous Stopped 12/11/21 1252)  ?prochlorperazine (COMPAZINE) injection 10 mg (10 mg Intravenous Given 12/11/21 1210)  ?diphenhydrAMINE (BENADRYL) injection 25 mg (25 mg Intravenous Given 12/11/21 1212)  ?haloperidol lactate (HALDOL) injection 3 mg (3 mg Intravenous Given 12/11/21 1418)  ?magnesium sulfate IVPB 2 g 50 mL (0 g Intravenous Stopped 12/11/21 1439)  ? ? ?ED Course/ Medical Decision Making/ A&P ?  ?                        ?Medical Decision Making ?Amount and/or Complexity of Data Reviewed ?Labs: ordered. ?Radiology: ordered. ? ?Risk ?Prescription drug management. ? ? ? ?  51 year old female with a history of hypertension, seizures, history of alcohol abuse and withdrawal who has been sober, presents with concern for seizure, nausea and vomiting.  Does not remember details of last night, signs of head trauma, feels similar to prior seizures.  CT head completed given trauma and headache, shows no sign of intracranial hemorrhage.  Labs show no clinically significant electrolyte abnormalities, anemia.  She has a nonspecific leukocytosis.  Does describe some diarrhea in addition to nausea and vomiting.  Consider other viral etiology.  Has history of past migraines, with continuing headache after the seizure which may be secondary to migraine and concussion.  Have low suspicion for occult subarachnoid hemorrhage, given more likely migraine/concussion/postictal headache, and do not feel that lumbar puncture is indicated at this time.  She is given headache cocktail with Compazine and Benadryl, given magnesium/haldol with some improvement but continuing headache-reports she prefers to go home to rest.  Given rx for sumatriptan hand which she had been on previously as needed for migraine. ? ?Loaded with Keppra given seizure.  Discussed  with neurology given no clear trigger, will increase lacosamide to 150 mg twice a day from 100 mg in the morning and 150 at night.  Lacosamide level is pending.  Further history, do feel that possible viral infection lo

## 2021-12-12 ENCOUNTER — Telehealth: Payer: Self-pay | Admitting: Neurology

## 2021-12-12 MED ORDER — LACOSAMIDE 50 MG PO TABS: ORAL_TABLET | ORAL | 5 refills | Status: DC

## 2021-12-12 NOTE — Telephone Encounter (Signed)
I spoke to the patient. Two issues: ? ?1) She went to the pharmacy to pick up her lacosamide today. They only had enough in stock for two days. This will void the remainder of the prescription. They will have the other tablets delivered tomorrow. New script will need to be sent. Attached to this note for approval. ? ?2) The patient reports having a seizure during the night this past Saturday. She remembers getting up to go the bathroom but nothing else. Her mother came over to her house because she could not get in touch with her Sunday morning. Found her in bed sleeping at 10am. States she was tired with headache, abrasion on forehead, bruise on side of head. Back to baseline now. Denies any missed doses of medication.  ?

## 2021-12-12 NOTE — Telephone Encounter (Signed)
Pt called in regards to needing lacosamide (VIMPAT) 50 MG TABS tablet, her pharmacy ([facility] [phone])  only gave her 2 days worth. Pt had a seizure early Sunday morning, due to seizure she is now to be taking 3 tablets in morning and 3 tablets in evening. Would like a call back.  ?

## 2021-12-12 NOTE — Telephone Encounter (Signed)
Rx of Vimpat sent to the pharmacy

## 2021-12-13 NOTE — Telephone Encounter (Signed)
DME: [facility] ?Ph: [phone] ?Fax: [phone] ?[facility]  ?Setup 12/05/21 (appt needed 01/05/22-03/06/22) ? ?Appt scheduled for 02/08/22 at 3:45 PM. ?

## 2021-12-13 NOTE — Telephone Encounter (Addendum)
Left patient detailed message (ok per DPR). Notified her that her refill has been sent to the pharmacy and there will be no change her in treatment plan. Provided our number to call back with any questions. ?

## 2021-12-13 NOTE — Telephone Encounter (Signed)
At this time no, continue with Vimpat 150 mg BID. 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 She had a 5 day EMU admission and  EEG was normal.

## 2021-12-14 LAB — LACOSAMIDE: Lacosamide: 3.7 ug/mL — ABNORMAL LOW (ref 5.0–10.0)

## 2021-12-28 ENCOUNTER — Encounter: Payer: Self-pay | Admitting: *Deleted

## 2021-12-28 ENCOUNTER — Encounter: Payer: Self-pay | Admitting: Neurology

## 2021-12-28 NOTE — Telephone Encounter (Signed)
I spoke to the patient. ED visit on 12/11/21 for breakthrough seizure. Lacosamide level 3.7. Patient reports being sick and vomiting around the time of the visit (threw up meds). WBC elevated. It was felt a viral infection my have lowered her seizure threshold. She is taking the increased dose of lacosamide at 150mg  BID. Doing well. No further seizures reports. She will continue her medication, call for any seizure activity and keep her pending appt. She understands the [place] law related to driving restrictions. Same letter previously given to her has been sent through [facility] with the new date.  ?

## 2022-02-08 ENCOUNTER — Ambulatory Visit: Payer: BC Managed Care – PPO | Admitting: Adult Health

## 2022-02-08 ENCOUNTER — Encounter: Payer: Self-pay | Admitting: Adult Health

## 2022-02-08 VITALS — BP 133/96 | HR 69 | Ht 62.0 in | Wt 238.0 lb

## 2022-02-08 DIAGNOSIS — Z9989 Dependence on other enabling machines and devices: Secondary | ICD-10-CM

## 2022-02-08 DIAGNOSIS — G40909 Epilepsy, unspecified, not intractable, without status epilepticus: Secondary | ICD-10-CM

## 2022-02-08 DIAGNOSIS — G4733 Obstructive sleep apnea (adult) (pediatric): Secondary | ICD-10-CM | POA: Diagnosis not present

## 2022-02-08 NOTE — Progress Notes (Signed)
[facility] [address] [place]. [place] [address] [phone]       OFFICE FOLLOW UP NOTE  [name] Date of Birth:  04-04-1971 Medical Record Number:  [id number]   Reason for visit: Initial CPAP follow-up    SUBJECTIVE:   CHIEF COMPLAINT:  Chief Complaint  Patient presents with   Obstructive Sleep Apnea    RM 3 alone  Pt is well and stable, no concerns     HPI:   Update 02/08/2022 JM: Patient is being seen for initial CPAP follow-up visit.  Completed HST 3/27 which showed severe OSA with total AHI of 46.4/h and O2 nadir of 80%.  Recommend initiating AutoPap with possible need of titration study in the future if indicated.  CPAP initiated on 12/05/2021.  Review of compliance report as below.  She reports tolerating CPAP very well and has already noticed significant benefit.  She has more energy during the day and sleeping better at night. She will have occasional leaks but she has found she will occasionally remove her mask while sleeping. She otherwise has no concerns or issues with leaking.  She she also believes her seizures have improved since starting CPAP, seizures have not been as severe and currently occurring 1 time per month.  She is followed by Dr. Teresa Coombs.  Has follow-up visit scheduled in August.  Epworth Sleepiness Scale 8/24 (prior to CPAP 9/24) Fatigue severity scale 26/63          History provided for reference purposes only Consult visit 10/25/2021 Dr. 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  She has an app that tracks her sleep and based on the app she reports loud snoring and also sleep disruption, concerning for pausing in her breathing.   She has not woken up with a sense of gasping for air and is not aware of any family history of sleep apnea, however, she does have a strong family history of early cardiac death.  Her bedtime is generally around 9 PM and rise time between 7 and 8 AM.  She works from home.  She lives alone.  She has a 50 year old daughter who is currently pregnant and due in May, her 45 year old son has mental health issues and she does have quite a bit of stress because of her son.  She has nocturia about 2-4 times per night, denies recurrent morning headaches but does have a history of migraines in the past.  She does not drink caffeine on a day-to-day basis, she has not had any alcohol for nearly 3 years, she does vape nicotine, she has not been a cigarette smoker.  She has 2 dogs and 1 cat in the household, one of the dog sleeps with her on the bed.  She is a side sleeper.  She would be willing to get tested for sleep apnea and consider CPAP therapy. I reviewed the office note from 10/10/2021.  Epworth sleepiness score is 9 out of 24, fatigue severity score is 52 out of 63.    ROS:   14 system review of systems performed and negative with exception of those listed in HPI  PMH:  Past Medical History:  Diagnosis Date   Alcohol abuse    Alcohol withdrawal (  HCC) 08/28/2018   High cholesterol    Hypertension    Seizures (HCC)     PSH: History reviewed. No pertinent surgical history.  Social History:  Social History   Socioeconomic History   Marital status: Divorced    Spouse name: Not on file   Number of children: Not on file   Years of education: Not on file   Highest education level: Not on file  Occupational History   Not on file  Tobacco Use   Smoking status: Never   Smokeless tobacco: Never  Vaping Use   Vaping Use: Some days  Substance and Sexual Activity   Alcohol use: Not Currently   Drug use: Yes    Types: Marijuana   Sexual activity: Not on file  Other Topics Concern   Not on file   Social History Narrative   Not on file   Social Determinants of Health   Financial Resource Strain: Not on file  Food Insecurity: Not on file  Transportation Needs: Not on file  Physical Activity: Not on file  Stress: Not on file  Social Connections: Not on file  Intimate Partner Violence: Not on file    Family History:  Family History  Problem Relation Age of Onset   Sleep apnea Neg Hx     Medications:   Current Outpatient Medications on File Prior to Visit  Medication Sig Dispense Refill   carvedilol (COREG) 12.5 MG tablet Take 1 tablet (12.5 mg total) by mouth 2 (two) times daily. 30 tablet 0   Cyanocobalamin (B-12 PO) Take by mouth daily.     lacosamide (VIMPAT) 50 MG TABS tablet take 3 tablets (150 mg total) in the morning and 3 tablets (150 mg total) every evening. 180 tablet 5   losartan (COZAAR) 50 MG tablet Take 50 mg by mouth daily. Take only if blood pressure is over 139.     Multiple Vitamin (MULTIVITAMIN WITH MINERALS) TABS tablet Take 1 tablet by mouth daily.     SUMAtriptan (IMITREX) 100 MG tablet Take 1 tablet (100 mg total) by mouth daily as needed for migraine. 10 tablet 0   Vitamin D, Ergocalciferol, (DRISDOL) 1.25 MG (50000 UNIT) CAPS capsule Take 50,000 Units by mouth once a week. Tuesdays     No current facility-administered medications on file prior to visit.    Allergies:   Allergies  Allergen Reactions   Losartan Potassium-Hctz     "Out of mind"/high ammonia level   Meperidine Itching      OBJECTIVE:  Physical Exam  Vitals:   02/08/22 1538  BP: (!) 133/96  Pulse: 69  Weight: 238 lb (108 kg)  Height: 5\' 2"  (1.575 m)   Body mass index is 43.53 kg/m. No results found.  General: Morbidly obese very pleasant middle-age Caucasian female, seated, in no evident distress Head: head normocephalic and atraumatic.   Neck: supple with no carotid or supraclavicular bruits Cardiovascular: regular rate and rhythm, no murmurs Musculoskeletal:  no deformity Skin:  no rash/petichiae Vascular:  Normal pulses all extremities   Neurologic Exam Mental Status: Awake and fully alert.  Fluent speech and language.  Oriented to place and time. Recent and remote memory intact. Attention span, concentration and fund of knowledge appropriate. Mood and affect appropriate.  Cranial Nerves: Pupils equal, briskly reactive to light. Extraocular movements full without nystagmus. Visual fields full to confrontation. Hearing intact. Facial sensation intact. Face, tongue, palate moves normally and symmetrically.  Motor: Normal bulk and tone. Normal strength in all tested  extremity muscles Sensory.: intact to touch , pinprick , position and vibratory sensation.  Coordination: Rapid alternating movements normal in all extremities. Finger-to-nose and heel-to-shin performed accurately bilaterally. Gait and Station: Arises from chair without difficulty. Stance is normal. Gait demonstrates normal stride length and balance without use of AD. Tandem walk and heel toe without difficulty.  Reflexes: 1+ and symmetric. Toes downgoing.         ASSESSMENT: Meredith Park is a 51 y.o. year old female with new diagnosis of severe sleep apnea per HST completed in 11/2021 and initiation of CPAP on 12/05/2021.  Followed by Dr. Teresa Coombsamara for seizure disorder reporting approximately 1 seizure per month but not as severe as previously     PLAN:  OSA on CPAP : Satisfactory compliance with residual AHI 5.1.  She does have some leaks which may be contributing and hopefully will improve as she continues to get used to her mask.  Discussed continued nightly usage with ensuring greater than 4 hours nightly for optimal benefit and per insurance purposes.  Continue to follow with DME company for any needed supplies or CPAP related concerns Seizure disorder: will send message to Dr. Teresa Coombsamara and nurse to see if sooner follow-up visit is needed to discuss continued  seizures     Follow up in 6 months or call earlier if needed   CC:  PCP: Piedad ClimesFulbright, [place] E, PA-C    I spent 26 minutes of face-to-face and non-face-to-face time with patient.  This included previsit chart review, lab review, study review, order entry, electronic health record documentation, patient education regarding diagnosis of sleep apnea with review and discussion of compliance report, brief discussion regarding seizure disorder and answered all other questions to patient's satisfaction   Ihor AustinJessica McCue, [facility] LLCGNP-BC  [facility] [address] [place], [place] [id number]  Phone [phone] Fax [phone] Note: This document was prepared with digital dictation and possible smart phrase technology. Any transcriptional errors that result from this process are unintentional.

## 2022-03-08 ENCOUNTER — Telehealth: Payer: Self-pay | Admitting: Adult Health

## 2022-03-08 ENCOUNTER — Encounter: Payer: Self-pay | Admitting: *Deleted

## 2022-03-08 NOTE — Telephone Encounter (Signed)
Hi Meredith Park,   No additional recommendation, I will see her next month for follow up.   Thank you  Dr. Teresa Coombs

## 2022-03-08 NOTE — Telephone Encounter (Signed)
Called patient who stated she is feeling fine now. She got lacosamide IV and migraine meds in ED. She stated the lacosamide dose she received was higher than her normal dose. She went back to taking 150 mg two times a day. She  requested this note be sent to Dr Teresa Coombs. I advised her if he has any recommendations I will call her back Patient verbalized understanding, appreciation.

## 2022-03-08 NOTE — Telephone Encounter (Signed)
Pt has called to report a bad seizure that she had on Saturday night, 07-01. Please call pt to discuss, she went to ED

## 2022-03-20 ENCOUNTER — Telehealth: Payer: Self-pay | Admitting: Adult Health

## 2022-03-20 NOTE — Telephone Encounter (Signed)
I spoke with the patient. [facility] is requesting patient's medical records for review. The patient is requesting that her records would be faxed to them.

## 2022-03-20 NOTE — Telephone Encounter (Signed)
Pt requesting referral for another neurologist sent to: Tim Lair, [facility]. Phone [phone] Fax: [phone]  Would like a call from the nurse.

## 2022-04-10 ENCOUNTER — Ambulatory Visit: Payer: BC Managed Care – PPO | Admitting: Neurology

## 2022-04-10 ENCOUNTER — Encounter: Payer: Self-pay | Admitting: Neurology

## 2022-04-10 VITALS — BP 116/82 | HR 70 | Ht 62.0 in | Wt 233.5 lb

## 2022-04-10 DIAGNOSIS — G40909 Epilepsy, unspecified, not intractable, without status epilepticus: Secondary | ICD-10-CM

## 2022-04-10 DIAGNOSIS — Z5181 Encounter for therapeutic drug level monitoring: Secondary | ICD-10-CM

## 2022-04-10 MED ORDER — NAYZILAM 5 MG/0.1ML NA SOLN: 5.0000 mg | NASAL | 3 refills | Status: DC | PRN

## 2022-04-10 MED ORDER — LACOSAMIDE 200 MG PO TABS: 200.0000 mg | ORAL_TABLET | Freq: Two times a day (BID) | ORAL | 3 refills | Status: DC

## 2022-04-10 NOTE — Patient Instructions (Signed)
Increase Vimpat to 200 mg twice daily  Will check a Vimpat level today  Contact me if you continue to have breakthrough seizures, will consider adding a second agent and EMU referral  Nayzilam as rescue medications  Follow up in 6 months or sooner if worse

## 2022-04-10 NOTE — Progress Notes (Signed)
[facility]  PATIENT: Meredith Park DOB: August 11, 1971  REQUESTING CLINICIAN: Belva Bertin, [place], * HISTORY FROM: Patient and mother  REASON FOR VISIT: Seizure like activity.    HISTORICAL  CHIEF COMPLAINT:  Chief Complaint  Patient presents with   Follow-up    Rm 13. Accompanied by daughter. Pt reports seizure where she was admitted to the ED on 7/1. Seizures have occurred on the first of the month for the past few months.    INTERVAL HISTORY 04/10/2022:  Meredith Park presents today for follow-up, she is accompanied by her daughter who provides some history.  Since last visit she continues to have seizures.  She reports having small seizures where she will stare, have speech arrest and others where she will have generalized convulsion.  She reports that her last generalized convulsion was on July 1.  Patient reports remembering sitting down, had odd smell, then feeling hot then she remembers waking up from the floor with people around her.  She did have urinary incontinence and tongue biting.  She reports being under a lot of stress surrounding the date of the seizure.  She is currently on Vimpat 150 mg, denies any side effect of the medication report compliance.  She has noted her last few generalized convulsion occurred around the first of the month.    INTERVAL HISTORY 10/10/2021:  Patient presents for follow-up, last visit was in December, at that time plan was to get an EEG and brain MRI. EEG completed and showed right temporal sharp and intermittent right temporal slowing. MRI brain with no acute finding.  Patient presented to the emergency department on January 26, at that time she was reporting multiple seizure-like episode, reportedly staring, she stated that she could not understand, could not state her name, could not even remember her mother's name. Episode would last about 10 minutes.  She was admitted to the hospital, had a long-term monitoring for total of 5  days and no seizures were captured, and there was no evidence of epileptiform discharges.  Patient was discharged home with slight increase of Vimpat 150 mg in the morning and 100 at night with plan to follow-up with me. She reports currently she is under a lot of stress due to her son.  Her son has a diagnosis of schizophrenia and he is abusing street drugs, currently he is living in the street.  Her father was able to get him a apartment next to her house and this is again a big source of stress per patient.   HISTORY OF PRESENT ILLNESS:  This is a 51 year old woman with PMHx Seizure in the setting of alcohol withdrawal, hypertension, hyperlipidemia, alcohol abuse in remission who is presenting with complaint of seizures.  Patient said that 10 days ago he was riding in her car, started to get warm felt like she was responding but family told her the only thing she was saying was okay, okay, okay,.  She was able to pull the car on the side of the road and get out of the car.  Patient reported once she get out of the car, the next thing that she remembers is waking up 40 minutes later in the ambulance, she did not remember the event.  Mother was present and witnessed patient coming out of the car, she was staring, felt like she was going to fall down and she help her to the ground, no abnormal movements noted but patient was unresponsive for about 5 minutes, EMS was  called.  Mother also has reported episode of staring, where she will stare, be unresponsive lasting seconds and she will go back to her normal self.  No episode of abnormal movement, no convulsion.  The night before the event patient reported taking her son's Adderall.  Patient stated that she is going through menopause and thinks some of these hot flashes are related to menopause. She reported in the end in the month of June she was admitted for altered mental status, noted that her ammonia level was elevated and was told that it was secondary to  the losartan which was discontinued. She reported history of alcohol abuse states that she has been sober for the past 2-1/2 years.  However she does take marijuana cookies at night. Patient also reports loud snoring, daytime fatigue.  She states she has a smart watch that has been telling her sleep quality is poor, that she has apnea, up to 5 times per hour. She has never been evaluated for sleep apnea.   Handedness: right handed   Seizure Type: Staring type, unresponsive  Current frequency: Last episode was 10 days ago.  Any injuries from seizures: None  Seizure risk factors: Alcohol abuse, No family history of seizure  Previous ASMs: None   Currenty ASMs: None   ASMs side effects: Not applicable  Brain Images: Head CT November 2022 which was normal  Previous EEGs: Not done   OTHER MEDICAL CONDITIONS: Alcohol abuse in remission, HTN, HLD, sleep apnea  REVIEW OF SYSTEMS: Full 14 system review of systems performed and negative with exception of: as noted in the HPI   ALLERGIES: Allergies  Allergen Reactions   Losartan Potassium-Hctz     "Out of mind"/high ammonia level   Meperidine Itching    HOME MEDICATIONS: Outpatient Medications Prior to Visit  Medication Sig Dispense Refill   carvedilol (COREG) 12.5 MG tablet Take 1 tablet (12.5 mg total) by mouth 2 (two) times daily. 30 tablet 0   losartan (COZAAR) 50 MG tablet Take 50 mg by mouth daily. Take only if blood pressure is over 139.     Multiple Vitamin (MULTIVITAMIN WITH MINERALS) TABS tablet Take 1 tablet by mouth daily.     SUMAtriptan (IMITREX) 100 MG tablet Take 1 tablet (100 mg total) by mouth daily as needed for migraine. 10 tablet 0   Vitamin D, Ergocalciferol, (DRISDOL) 1.25 MG (50000 UNIT) CAPS capsule Take 50,000 Units by mouth once a week. Tuesdays     lacosamide (VIMPAT) 50 MG TABS tablet take 3 tablets (150 mg total) in the morning and 3 tablets (150 mg total) every evening. 180 tablet 5   Cyanocobalamin  (B-12 PO) Take by mouth daily.     No facility-administered medications prior to visit.    PAST MEDICAL HISTORY: Past Medical History:  Diagnosis Date   Alcohol abuse    Alcohol withdrawal (HCC) 08/28/2018   High cholesterol    Hypertension    Seizures (HCC)     PAST SURGICAL HISTORY: History reviewed. No pertinent surgical history.  FAMILY HISTORY: Family History  Problem Relation Age of Onset   Sleep apnea Neg Hx     SOCIAL HISTORY: Social History   Socioeconomic History   Marital status: Divorced    Spouse name: Not on file   Number of children: Not on file   Years of education: Not on file   Highest education level: Not on file  Occupational History   Not on file  Tobacco Use   Smoking status:  Never   Smokeless tobacco: Never  Vaping Use   Vaping Use: Some days  Substance and Sexual Activity   Alcohol use: Not Currently   Drug use: Yes    Types: Marijuana   Sexual activity: Not on file  Other Topics Concern   Not on file  Social History Narrative   Not on file   Social Determinants of Health   Financial Resource Strain: Not on file  Food Insecurity: Not on file  Transportation Needs: Not on file  Physical Activity: Not on file  Stress: Not on file  Social Connections: Not on file  Intimate Partner Violence: Not on file    PHYSICAL EXAM  GENERAL EXAM/CONSTITUTIONAL: Vitals:  Vitals:   04/10/22 1134  BP: 116/82  Pulse: 70  Weight: 233 lb 8 oz (105.9 kg)  Height: 5\' 2"  (1.575 m)     Body mass index is 42.71 kg/m. Wt Readings from Last 3 Encounters:  04/10/22 233 lb 8 oz (105.9 kg)  02/08/22 238 lb (108 kg)  11/01/21 238 lb 1.6 oz (108 kg)   Patient is in no distress; well developed, nourished and groomed; neck is supple  EYES: Pupils round and reactive to light, Visual fields full to confrontation, Extraocular movements intacts,  No results found.  MUSCULOSKELETAL: Gait, strength, tone, movements noted in Neurologic exam  below  NEUROLOGIC: MENTAL STATUS:      No data to display         awake, alert, oriented to person, place and time recent and remote memory intact normal attention and concentration language fluent, comprehension intact, naming intact fund of knowledge appropriate  CRANIAL NERVE:  2nd, 3rd, 4th, 6th - pupils equal and reactive to light, visual fields full to confrontation, extraocular muscles intact, no nystagmus 5th - facial sensation symmetric 7th - facial strength symmetric 8th - hearing intact 9th - palate elevates symmetrically, uvula midline 11th - shoulder shrug symmetric 12th - tongue protrusion midline  MOTOR:  normal bulk and tone, full strength in the BUE, BLE  SENSORY:  normal and symmetric to light touch, pinprick, temperature, vibration  COORDINATION:  finger-nose-finger, fine finger movements normal  REFLEXES:  deep tendon reflexes present and symmetric  GAIT/STATION:  normal   DIAGNOSTIC DATA (LABS, IMAGING, TESTING) - I reviewed patient records, labs, notes, testing and imaging myself where available.  Lab Results  Component Value Date   WBC 15.7 (H) 12/11/2021   HGB 14.3 12/11/2021   HCT 43.0 12/11/2021   MCV 84.5 12/11/2021   PLT 188 12/11/2021      Component Value Date/Time   NA 138 12/11/2021 1238   NA 144 10/10/2021 1300   K 4.3 12/11/2021 1238   CL 107 12/11/2021 1238   CO2 21 (L) 12/11/2021 1238   GLUCOSE 119 (H) 12/11/2021 1238   BUN 10 12/11/2021 1238   BUN 12 10/10/2021 1300   CREATININE 1.00 12/11/2021 1238   CALCIUM 9.2 12/11/2021 1238   PROT 7.0 12/11/2021 1238   ALBUMIN 4.5 12/11/2021 1238   AST 36 12/11/2021 1238   ALT 56 (H) 12/11/2021 1238   ALKPHOS 83 12/11/2021 1238   BILITOT 1.0 12/11/2021 1238   GFRNONAA >60 12/11/2021 1238   GFRAA >60 11/10/2018 1016   No results found for: "CHOL", "HDL", "LDLCALC", "LDLDIRECT", "TRIG" No results found for: "HGBA1C" Lab Results  Component Value Date   VITAMINB12 1,348  (H) 10/03/2021   Lab Results  Component Value Date   TSH 2.672 10/03/2021    Head CT  07/12/2021 No acute intracranial abnormality.  MRI Brain 09/14/2021 MRI scan of the brain with and without contrast showing only mild nonspecific changes of chronic small vessel disease. Incidental calcified small pineal region cyst is noted  Routine EEG 08/23/2021 Right temporal sharps  Intermittent right temporal slowing   LTM EEG 09/30/2021 This study is within normal limits. No seizures or epileptiform discharges were seen throughout the recording.   I personally reviewed brain Images and EEG.   ASSESSMENT AND PLAN  51 y.o. year old female  with past medical history of alcohol abuse in remission, hypertension, hyperlipidemia and 1 event of alcohol withdrawal seizure who is presenting for follow-up for seizure-like event.  She continues to have seizures despite being compliant with Vimpat 150 mg. Her last seizure was on July 1. At this time, I will check a Vimpat level, but will increase the Vimpat to 200 mg BID. Advise patient to contact me if she continues to have breakthrough seizures, at that time, will add a second agent but will also referred her back to the EMU. She is comfortable with plan. I will also provide her with a abortive medication, Nayzilam. Follow up in 6 months or sooner if worse.    1. Seizure disorder ([place])   2. Therapeutic drug monitoring     Patient Instructions  Increase Vimpat to 200 mg twice daily  Will check a Vimpat level today  Contact me if you continue to have breakthrough seizures, will consider adding a second agent and EMU referral  Nayzilam as rescue medications  Follow up in 6 months or sooner if worse    Per [facility] statutes, patients with seizures are not allowed to drive until they have been seizure-free for six months.  Other recommendations include using caution when using heavy equipment or power tools. Avoid working on ladders or at  heights. Take showers instead of baths.  Do not swim alone.  Ensure the water temperature is not too high on the home water heater. Do not go swimming alone. Do not lock yourself in a room alone (i.e. bathroom). When caring for infants or small children, sit down when holding, feeding, or changing them to minimize risk of injury to the child in the event you have a seizure. Maintain good sleep hygiene. Avoid alcohol.  Also recommend adequate sleep, hydration, good diet and minimize stress.   During the Seizure  - First, ensure adequate ventilation and place patients on the floor on their left side  Loosen clothing around the neck and ensure the airway is patent. If the patient is clenching the teeth, do not force the mouth open with any object as this can cause severe damage - Remove all items from the surrounding that can be hazardous. The patient may be oblivious to what's happening and may not even know what he or she is doing. If the patient is confused and wandering, either gently guide him/her away and block access to outside areas - Reassure the individual and be comforting - Call 911. In most cases, the seizure ends before EMS arrives. However, there are cases when seizures may last over 3 to 5 minutes. Or the individual may have developed breathing difficulties or severe injuries. If a pregnant patient or a person with diabetes develops a seizure, it is prudent to call an ambulance. - Finally, if the patient does not regain full consciousness, then call EMS. Most patients will remain confused for about 45 to 90 minutes after a seizure, so you  must use judgment in calling for help. - Avoid restraints but make sure the patient is in a bed with padded side rails - Place the individual in a lateral position with the neck slightly flexed; this will help the saliva drain from the mouth and prevent the tongue from falling backward - Remove all nearby furniture and other hazards from the area -  Provide verbal assurance as the individual is regaining consciousness - Provide the patient with privacy if possible - Call for help and start treatment as ordered by the caregiver   After the Seizure (Postictal Stage)  After a seizure, most patients experience confusion, fatigue, muscle pain and/or a headache. Thus, one should permit the individual to sleep. For the next few days, reassurance is essential. Being calm and helping reorient the person is also of importance.  Most seizures are painless and end spontaneously. Seizures are not harmful to others but can lead to complications such as stress on the lungs, brain and the heart. Individuals with prior lung problems may develop labored breathing and respiratory distress.     Orders Placed This Encounter  Procedures   Lacosamide     Meds ordered this encounter  Medications   lacosamide (VIMPAT) 200 MG TABS tablet    Sig: Take 1 tablet (200 mg total) by mouth 2 (two) times daily. take 3 tablets (150 mg total) in the morning and 3 tablets (150 mg total) every evening.    Dispense:  180 tablet    Refill:  3   Midazolam (NAYZILAM) 5 MG/0.1ML SOLN    Sig: Place 5 mg into the nose as needed.    Dispense:  2 each    Refill:  3     Return in about 6 months (around 10/11/2022).    Alric Ran, MD 04/10/2022, 4:11 PM  [facility] [address] [place], [place] [address] [phone]

## 2022-04-12 ENCOUNTER — Telehealth: Payer: Self-pay

## 2022-04-12 NOTE — Telephone Encounter (Signed)
Nayzilam 5 mg has been sent via cmm and received instant approval.   (Key: [id number])  This request has been approved.  Please note any additional information provided by [employer] at the bottom of your screen.[id number];BWIOMB:[id number];Review Type:Prior Auth;Coverage Start Date:03/13/2022;Coverage End Date:04/12/2023;

## 2022-04-13 LAB — LACOSAMIDE: Lacosamide: 8.7 ug/mL (ref 5.0–10.0)

## 2022-06-06 ENCOUNTER — Telehealth: Payer: Self-pay | Admitting: *Deleted

## 2022-06-06 NOTE — Telephone Encounter (Signed)
Recent download showed improvement of residual AHI at 1.9 (previously 5.1). no changes needed today. Continue nightly use of CPAP. Will follow up as scheduled in December.  Thank you.

## 2022-08-15 NOTE — Progress Notes (Deleted)
[facility] [address] [place]. [place] [address] [phone]       OFFICE FOLLOW UP NOTE  [name] Date of Birth:  [birth year] Medical Record Number:  [id number]   Reason for visit: CPAP follow-up    SUBJECTIVE:   CHIEF COMPLAINT:  No chief complaint on file.   HPI:   Update 08/16/2022 JM: Patient returns for 8-month CPAP compliance visit.         History provided for reference purposes only Update 02/08/2022 JM: Patient is being seen for initial CPAP follow-up visit.  Completed HST 3/27 which showed severe OSA with total AHI of 46.4/h and O2 nadir of 80%.  Recommend initiating AutoPap with possible need of titration study in the future if indicated.  CPAP initiated on 12/05/2021.  Review of compliance report as below.  She reports tolerating CPAP very well and has already noticed significant benefit.  She has more energy during the day and sleeping better at night. She will have occasional leaks but she has found she will occasionally remove her mask while sleeping. She otherwise has no concerns or issues with leaking.  She she also believes her seizures have improved since starting CPAP, seizures have not been as severe and currently occurring 1 time per month.  She is followed by Dr. Teresa Coombs.  Has follow-up visit scheduled in August.  Epworth Sleepiness Scale 8/24 (prior to CPAP 9/24) Fatigue severity scale 26/63        Consult visit 10/25/2021 Dr. Frances Furbish: [name] is a 51 year old right-handed woman with an underlying medical history of seizures, hyperlipidemia, hypertension, alcohol use disorder (in remission x nearly 3 y), and severe obesity with a BMI of over 40, who reports snoring and excessive daytime somnolence as well as poor quality sleep, not waking up rested.  She has an app that tracks her sleep and based on the app she reports loud snoring and also sleep disruption, concerning for pausing in her breathing.  She has not woken  up with a sense of gasping for air and is not aware of any family history of sleep apnea, however, she does have a strong family history of early cardiac death.  Her bedtime is generally around 9 PM and rise time between 7 and 8 AM.  She works from home.  She lives alone.  She has a 43 year old daughter who is currently pregnant and due in May, her 37 year old son has mental health issues and she does have quite a bit of stress because of her son.  She has nocturia about 2-4 times per night, denies recurrent morning headaches but does have a history of migraines in the past.  She does not drink caffeine on a day-to-day basis, she has not had any alcohol for nearly 3 years, she does vape nicotine, she has not been a cigarette smoker.  She has 2 dogs and 1 cat in the household, one of the dog sleeps with her on the bed.  She is a side sleeper.  She would be willing to get tested for sleep apnea and consider CPAP therapy. I reviewed the office note from 10/10/2021.  Epworth sleepiness score is 9 out of 24, fatigue severity score is 52 out of 63.    ROS:   14 system review of systems performed and negative with exception of those listed in HPI  PMH:  Past Medical History:  Diagnosis Date   Alcohol abuse    Alcohol withdrawal (HCC) 08/28/2018   High cholesterol  Hypertension    Seizures (HCC)     PSH: No past surgical history on file.  Social History:  Social History   Socioeconomic History   Marital status: Divorced    Spouse name: Not on file   Number of children: Not on file   Years of education: Not on file   Highest education level: Not on file  Occupational History   Not on file  Tobacco Use   Smoking status: Never   Smokeless tobacco: Never  Vaping Use   Vaping Use: Some days  Substance and Sexual Activity   Alcohol use: Not Currently   Drug use: Yes    Types: Marijuana   Sexual activity: Not on file  Other Topics Concern   Not on file  Social History Narrative   Not on  file   Social Determinants of Health   Financial Resource Strain: Not on file  Food Insecurity: Not on file  Transportation Needs: Not on file  Physical Activity: Not on file  Stress: Not on file  Social Connections: Not on file  Intimate Partner Violence: Not on file    Family History:  Family History  Problem Relation Age of Onset   Sleep apnea Neg Hx     Medications:   Current Outpatient Medications on File Prior to Visit  Medication Sig Dispense Refill   carvedilol (COREG) 12.5 MG tablet Take 1 tablet (12.5 mg total) by mouth 2 (two) times daily. 30 tablet 0   lacosamide (VIMPAT) 200 MG TABS tablet Take 1 tablet (200 mg total) by mouth 2 (two) times daily. take 3 tablets (150 mg total) in the morning and 3 tablets (150 mg total) every evening. 180 tablet 3   losartan (COZAAR) 50 MG tablet Take 50 mg by mouth daily. Take only if blood pressure is over 139.     Midazolam (NAYZILAM) 5 MG/0.1ML SOLN Place 5 mg into the nose as needed. 2 each 3   Multiple Vitamin (MULTIVITAMIN WITH MINERALS) TABS tablet Take 1 tablet by mouth daily.     SUMAtriptan (IMITREX) 100 MG tablet Take 1 tablet (100 mg total) by mouth daily as needed for migraine. 10 tablet 0   Vitamin D, Ergocalciferol, (DRISDOL) 1.25 MG (50000 UNIT) CAPS capsule Take 50,000 Units by mouth once a week. Tuesdays     No current facility-administered medications on file prior to visit.    Allergies:   Allergies  Allergen Reactions   Losartan Potassium-Hctz     "Out of mind"/high ammonia level   Meperidine Itching      OBJECTIVE:  Physical Exam  There were no vitals filed for this visit.  There is no height or weight on file to calculate BMI. No results found.  General: Morbidly obese very pleasant middle-age Caucasian female, seated, in no evident distress Head: head normocephalic and atraumatic.   Neck: supple with no carotid or supraclavicular bruits Cardiovascular: regular rate and rhythm, no  murmurs Musculoskeletal: no deformity Skin:  no rash/petichiae Vascular:  Normal pulses all extremities   Neurologic Exam Mental Status: Awake and fully alert.  Fluent speech and language.  Oriented to place and time. Recent and remote memory intact. Attention span, concentration and fund of knowledge appropriate. Mood and affect appropriate.  Cranial Nerves: Pupils equal, briskly reactive to light. Extraocular movements full without nystagmus. Visual fields full to confrontation. Hearing intact. Facial sensation intact. Face, tongue, palate moves normally and symmetrically.  Motor: Normal bulk and tone. Normal strength in all tested extremity muscles  Sensory.: intact to touch , pinprick , position and vibratory sensation.  Coordination: Rapid alternating movements normal in all extremities. Finger-to-nose and heel-to-shin performed accurately bilaterally. Gait and Station: Arises from chair without difficulty. Stance is normal. Gait demonstrates normal stride length and balance without use of AD. Tandem walk and heel toe without difficulty.  Reflexes: 1+ and symmetric. Toes downgoing.         ASSESSMENT: Meredith Park is a 51 y.o. year old female with new diagnosis of severe sleep apnea per HST completed in 11/2021 and initiation of CPAP on 12/05/2021.  Followed by Dr. Teresa Coombs for seizure disorder reporting approximately 1 seizure per month but not as severe as previously     PLAN:  OSA on CPAP : Satisfactory compliance with residual AHI 5.1.  She does have some leaks which may be contributing and hopefully will improve as she continues to get used to her mask.  Discussed continued nightly usage with ensuring greater than 4 hours nightly for optimal benefit and per insurance purposes.  Continue to follow with DME company for any needed supplies or CPAP related concerns Seizure disorder: will send message to Dr. Teresa Coombs and nurse to see if sooner follow-up visit is needed to discuss  continued seizures     Follow up in 6 months or call earlier if needed   CC:  PCP: Piedad Climes, [place] E, PA-C    I spent 26 minutes of face-to-face and non-face-to-face time with patient.  This included previsit chart review, lab review, study review, order entry, electronic health record documentation, patient education regarding diagnosis of sleep apnea with review and discussion of compliance report, brief discussion regarding seizure disorder and answered all other questions to patient's satisfaction   Ihor Austin, [facility]  [facility] [address] [place], [place] [address]  Phone [phone] Fax [phone] Note: This document was prepared with digital dictation and possible smart phrase technology. Any transcriptional errors that result from this process are unintentional.

## 2022-08-16 ENCOUNTER — Ambulatory Visit: Payer: BC Managed Care – PPO | Admitting: Adult Health

## 2022-08-16 ENCOUNTER — Encounter: Payer: Self-pay | Admitting: Adult Health

## 2022-10-18 ENCOUNTER — Ambulatory Visit: Payer: Self-pay | Admitting: Neurology

## 2022-11-06 ENCOUNTER — Inpatient Hospital Stay (HOSPITAL_BASED_OUTPATIENT_CLINIC_OR_DEPARTMENT_OTHER)
Admission: EM | Admit: 2022-11-06 | Discharge: 2022-11-09 | DRG: 101 | Disposition: A | Discharge status: Home / Self Care | Payer: 59 | Attending: Internal Medicine | Admitting: Internal Medicine

## 2022-11-06 ENCOUNTER — Emergency Department (HOSPITAL_BASED_OUTPATIENT_CLINIC_OR_DEPARTMENT_OTHER): Payer: 59

## 2022-11-06 ENCOUNTER — Encounter (HOSPITAL_BASED_OUTPATIENT_CLINIC_OR_DEPARTMENT_OTHER): Payer: Self-pay | Admitting: Urology

## 2022-11-06 DIAGNOSIS — F1010 Alcohol abuse, uncomplicated: Secondary | ICD-10-CM

## 2022-11-06 DIAGNOSIS — I10 Essential (primary) hypertension: Secondary | ICD-10-CM | POA: Diagnosis present

## 2022-11-06 DIAGNOSIS — F419 Anxiety disorder, unspecified: Secondary | ICD-10-CM | POA: Diagnosis present

## 2022-11-06 DIAGNOSIS — E7800 Pure hypercholesterolemia, unspecified: Secondary | ICD-10-CM | POA: Diagnosis present

## 2022-11-06 DIAGNOSIS — G40909 Epilepsy, unspecified, not intractable, without status epilepticus: Principal | ICD-10-CM | POA: Diagnosis present

## 2022-11-06 DIAGNOSIS — R569 Unspecified convulsions: Principal | ICD-10-CM

## 2022-11-06 DIAGNOSIS — R413 Other amnesia: Secondary | ICD-10-CM | POA: Diagnosis present

## 2022-11-06 DIAGNOSIS — R748 Abnormal levels of other serum enzymes: Secondary | ICD-10-CM | POA: Diagnosis present

## 2022-11-06 DIAGNOSIS — E8729 Other acidosis: Secondary | ICD-10-CM

## 2022-11-06 DIAGNOSIS — Z885 Allergy status to narcotic agent status: Secondary | ICD-10-CM

## 2022-11-06 DIAGNOSIS — R7401 Elevation of levels of liver transaminase levels: Secondary | ICD-10-CM | POA: Diagnosis present

## 2022-11-06 DIAGNOSIS — M6282 Rhabdomyolysis: Secondary | ICD-10-CM

## 2022-11-06 DIAGNOSIS — G40919 Epilepsy, unspecified, intractable, without status epilepticus: Secondary | ICD-10-CM

## 2022-11-06 DIAGNOSIS — E871 Hypo-osmolality and hyponatremia: Secondary | ICD-10-CM

## 2022-11-06 DIAGNOSIS — Z888 Allergy status to other drugs, medicaments and biological substances status: Secondary | ICD-10-CM

## 2022-11-06 DIAGNOSIS — R000 Tachycardia, unspecified: Secondary | ICD-10-CM | POA: Diagnosis present

## 2022-11-06 DIAGNOSIS — F1210 Cannabis abuse, uncomplicated: Secondary | ICD-10-CM | POA: Diagnosis present

## 2022-11-06 DIAGNOSIS — E872 Acidosis, unspecified: Secondary | ICD-10-CM | POA: Diagnosis present

## 2022-11-06 DIAGNOSIS — Z79899 Other long term (current) drug therapy: Secondary | ICD-10-CM

## 2022-11-06 DIAGNOSIS — F17290 Nicotine dependence, other tobacco product, uncomplicated: Secondary | ICD-10-CM | POA: Diagnosis present

## 2022-11-06 DIAGNOSIS — D72829 Elevated white blood cell count, unspecified: Secondary | ICD-10-CM

## 2022-11-06 DIAGNOSIS — R9431 Abnormal electrocardiogram [ECG] [EKG]: Secondary | ICD-10-CM

## 2022-11-06 DIAGNOSIS — F32A Depression, unspecified: Secondary | ICD-10-CM | POA: Diagnosis present

## 2022-11-06 LAB — URINALYSIS, ROUTINE W REFLEX MICROSCOPIC
Bilirubin Urine: NEGATIVE
Glucose, UA: NEGATIVE mg/dL
Ketones, ur: 80 mg/dL — AB
Leukocytes,Ua: NEGATIVE
Nitrite: NEGATIVE
Protein, ur: 100 mg/dL — AB
Specific Gravity, Urine: >=1.03 (ref 1.005–1.030)
pH: 5.5 (ref 5.0–8.0)

## 2022-11-06 LAB — COMPREHENSIVE METABOLIC PANEL
ALT: 103 U/L — ABNORMAL HIGH (ref 0–44)
AST: 57 U/L — ABNORMAL HIGH (ref 15–41)
Albumin: 4.8 g/dL (ref 3.5–5.0)
Alkaline Phosphatase: 119 U/L (ref 38–126)
Anion gap: 20 — ABNORMAL HIGH (ref 5–15)
BUN: 17 mg/dL (ref 6–20)
CO2: 14 mmol/L — ABNORMAL LOW (ref 22–32)
Calcium: 9 mg/dL (ref 8.9–10.3)
Chloride: 97 mmol/L — ABNORMAL LOW (ref 98–111)
Creatinine, Ser: 1.03 mg/dL — ABNORMAL HIGH (ref 0.44–1.00)
GFR, Estimated: >60 mL/min (ref >60)
Glucose, Bld: 159 mg/dL — ABNORMAL HIGH (ref 70–99)
Potassium: 4.3 mmol/L (ref 3.5–5.1)
Sodium: 131 mmol/L — ABNORMAL LOW (ref 135–145)
Total Bilirubin: 1.9 mg/dL — ABNORMAL HIGH (ref 0.3–1.2)
Total Protein: 7.5 g/dL (ref 6.5–8.1)

## 2022-11-06 LAB — CBC
HCT: 42.9 % (ref 36.0–46.0)
Hemoglobin: 14.7 g/dL (ref 12.0–15.0)
MCH: 27.9 pg (ref 26.0–34.0)
MCHC: 34.3 g/dL (ref 30.0–36.0)
MCV: 81.4 fL (ref 80.0–100.0)
Platelets: 323 10*3/uL (ref 150–400)
RBC: 5.27 MIL/uL — ABNORMAL HIGH (ref 3.87–5.11)
RDW: 13.7 % (ref 11.5–15.5)
WBC: 12.6 10*3/uL — ABNORMAL HIGH (ref 4.0–10.5)
nRBC: 0 % (ref 0.0–0.2)

## 2022-11-06 LAB — CK: Total CK: 476 U/L — ABNORMAL HIGH (ref 38–234)

## 2022-11-06 LAB — URINALYSIS, MICROSCOPIC (REFLEX): WBC, UA: NONE SEEN WBC/hpf (ref 0–5)

## 2022-11-06 LAB — RAPID URINE DRUG SCREEN, HOSP PERFORMED
Amphetamines: NOT DETECTED
Barbiturates: NOT DETECTED
Benzodiazepines: NOT DETECTED
Cocaine: NOT DETECTED
Opiates: NOT DETECTED
Tetrahydrocannabinol: POSITIVE — AB

## 2022-11-06 LAB — LIPASE, BLOOD: Lipase: 26 U/L (ref 11–51)

## 2022-11-06 LAB — ETHANOL: Alcohol, Ethyl (B): <10 mg/dL (ref <10)

## 2022-11-06 LAB — MAGNESIUM: Magnesium: 2.4 mg/dL (ref 1.7–2.4)

## 2022-11-06 MED ORDER — LACTATED RINGERS IV BOLUS
1000.0000 mL | Freq: Once | INTRAVENOUS | Status: AC
Start: 2022-11-06 — End: 2022-11-06
  Administered 2022-11-06: 1000 mL via INTRAVENOUS

## 2022-11-06 MED ORDER — METOCLOPRAMIDE HCL 5 MG/ML IJ SOLN
10.0000 mg | Freq: Once | INTRAMUSCULAR | Status: AC
  Administered 2022-11-06: 10 mg via INTRAVENOUS
  Filled 2022-11-06: qty 2

## 2022-11-06 MED ORDER — LEVETIRACETAM IN NACL 1000 MG/100ML IV SOLN
2000.0000 mg | Freq: Once | INTRAVENOUS | Status: AC
  Administered 2022-11-06: 2000 mg via INTRAVENOUS
  Filled 2022-11-06: qty 200

## 2022-11-06 MED ORDER — SUMATRIPTAN SUCCINATE 6 MG/0.5ML ~~LOC~~ SOLN
6.0000 mg | Freq: Once | SUBCUTANEOUS | Status: AC
  Administered 2022-11-06: 6 mg via SUBCUTANEOUS
  Filled 2022-11-06: qty 0.5

## 2022-11-06 MED ORDER — DIAZEPAM 5 MG/ML IJ SOLN
5.0000 mg | Freq: Once | INTRAMUSCULAR | Status: AC
  Administered 2022-11-06: 5 mg via INTRAVENOUS
  Filled 2022-11-06: qty 2

## 2022-11-06 NOTE — Progress Notes (Signed)
Plan of Care Note for accepted transfer   Patient: Meredith Park MRN: [id number]   DOA: 11/06/2022  Facility requesting transfer: [facility] Requesting Provider: Sherwood Gambler, MD Reason for transfer: Seizure Facility course:  52 year old female with history of epilepsy and alcohol abuse presents with seizures.  History is mostly from the mom but also somewhat from the patient.  Patient has started drinking again for the first time in 4 years starting a few days ago and quit yesterday.  She has been vomiting all day today.  She did take 1 dose of her Lamictal this morning but otherwise has not been taking it for the past several days while drinking.  She has had 2-3 seizures today and then while in the waiting room her mom noted that she had another 1.  The patient was having twitching and shaking of her right face and right arm which then developed into a generalized tonic/clonic seizure.  When she came to the ER, BP was 163/95 with heart rate of 102 and later 126 with otherwise normal vital signs.  Labs revealed mild hyponatremia and hypochloremia with CO2 of 14 and glucose of 159 and anion gap of 20.  Magnesium was 2.4, AST 57 and ALT 103 with total bili of 1.9.  CBC showed mild leukocytosis 12.6.  UA showed 80 ketones in her protein with specific gravity more than 1003 and rare bacteria with no WBCs.  CK was 476  I recommended a stat head CT scan without contrast specially given history of alcoholism.  The patient was given 10 mg of IV Reglan, 2 g of IV Keppra and 1 L of IV lactated Ringer's bolus 5 mg of IV Valium.  Plan of care: The patient is accepted for admission to Telemetry unit, at [facility]..  She will need a neurology consult and EEG when she comes.  The patient will be under the care and supervision of the ED physician until arrival to [facility].  Author: Christel Mormon, MD 11/06/2022  Check [email or web address] for on-call coverage.  Nursing staff, Please call [place] number on Amion as soon as patient's arrival, so appropriate admitting provider can evaluate the pt.

## 2022-11-06 NOTE — ED Triage Notes (Signed)
Pt states h/o alcoholic  Pt last ETOH drink, missed seizure meds (Lamotigine) x 2 days Has had multiple seizures today  Reports nausea and blood noted in sputum  4 years sober prior to 3 days ago, 1/2 gallon ETOH daily x 3 days

## 2022-11-06 NOTE — ED Notes (Signed)
Pt actively seizing in the lobby and pulled back to T14. MD Regenia Skeeter at the bedside for examination and assessment. Orders will be placed for pt. RT at bedside.

## 2022-11-06 NOTE — ED Notes (Signed)
[place] for transport @ 2132

## 2022-11-06 NOTE — ED Provider Notes (Signed)
[place] EMERGENCY DEPARTMENT AT [place] Provider Note   CSN: [id number] Arrival date & time: 11/06/22  1831     History  Chief Complaint  Patient presents with   Seizures    Meredith Park is a 52 y.o. female.  HPI 52 year old female with history of epilepsy and alcohol abuse presents with seizures.  History is mostly from the mom but also somewhat from the patient.  Patient has started drinking again for the first time in 4 years starting a few days ago and quit yesterday.  She has been vomiting all day today.  She did take 1 dose of her Lamictal this morning but otherwise has not been taking it for the past several days while drinking.  She has had 2-3 seizures today and then while in the waiting room her mom noted that she had another 1.  The patient was having twitching and shaking of her right face and right arm which then developed into a generalized tonic/clonic seizure.  Home Medications Prior to Admission medications   Medication Sig Start Date End Date Taking? Authorizing Provider  carvedilol (COREG) 12.5 MG tablet Take 1 tablet (12.5 mg total) by mouth 2 (two) times daily. 08/24/17   Domenic Moras, PA-C  lacosamide (VIMPAT) 200 MG TABS tablet Take 1 tablet (200 mg total) by mouth 2 (two) times daily. take 3 tablets (150 mg total) in the morning and 3 tablets (150 mg total) every evening. 04/10/22 04/05/23  Alric Ran, MD  losartan (COZAAR) 50 MG tablet Take 50 mg by mouth daily. Take only if blood pressure is over 139. 07/12/21   [provider]  Midazolam (NAYZILAM) 5 MG/0.1ML SOLN Place 5 mg into the nose as needed. 04/10/22   Alric Ran, MD  Multiple Vitamin (MULTIVITAMIN WITH MINERALS) TABS tablet Take 1 tablet by mouth daily. 08/30/18   Aline August, MD  SUMAtriptan (IMITREX) 100 MG tablet Take 1 tablet (100 mg total) by mouth daily as needed for migraine. 12/11/21   Gareth Morgan, MD  Vitamin D, Ergocalciferol, (DRISDOL) 1.25 MG (50000  UNIT) CAPS capsule Take 50,000 Units by mouth once a week. Tuesdays 09/03/21   [provider]      Allergies    Losartan potassium-hctz and Meperidine    Review of Systems   Review of Systems  Gastrointestinal:  Positive for vomiting.  Neurological:  Positive for seizures and headaches (typical after seizures).    Physical Exam Updated Vital Signs BP (!) 154/90   Pulse 87   Temp 98.4 F (36.9 C) (Oral)   Resp 13   Ht '5\' 2"'$  (1.575 m)   Wt 105.9 kg   LMP 08/28/2020   SpO2 95%   BMI 42.70 kg/m  Physical Exam Vitals and nursing note reviewed.  Constitutional:      Appearance: She is well-developed.  HENT:     Head: Normocephalic and atraumatic.  Cardiovascular:     Rate and Rhythm: Regular rhythm. Tachycardia present.     Heart sounds: Normal heart sounds.  Pulmonary:     Effort: Pulmonary effort is normal.     Breath sounds: Normal breath sounds.  Abdominal:     General: There is no distension.     Palpations: Abdomen is soft.     Tenderness: There is no abdominal tenderness.  Musculoskeletal:     Cervical back: No rigidity.  Skin:    General: Skin is warm and dry.  Neurological:     Mental Status: She is alert.  ED Results / Procedures / Treatments   Labs (all labs ordered are listed, but only abnormal results are displayed) Labs Reviewed  COMPREHENSIVE METABOLIC PANEL - Abnormal; Notable for the following components:      Result Value   Sodium 131 (*)    Chloride 97 (*)    CO2 14 (*)    Glucose, Bld 159 (*)    Creatinine, Ser 1.03 (*)    AST 57 (*)    ALT 103 (*)    Total Bilirubin 1.9 (*)    Anion gap 20 (*)    All other components within normal limits  CBC - Abnormal; Notable for the following components:   WBC 12.6 (*)    RBC 5.27 (*)    All other components within normal limits  URINALYSIS, ROUTINE W REFLEX MICROSCOPIC - Abnormal; Notable for the following components:   Hgb urine dipstick SMALL (*)    Ketones, ur 80 (*)     Protein, ur 100 (*)    All other components within normal limits  RAPID URINE DRUG SCREEN, HOSP PERFORMED - Abnormal; Notable for the following components:   Tetrahydrocannabinol POSITIVE (*)    All other components within normal limits  URINALYSIS, MICROSCOPIC (REFLEX) - Abnormal; Notable for the following components:   Bacteria, UA RARE (*)    All other components within normal limits  CK - Abnormal; Notable for the following components:   Total CK 476 (*)    All other components within normal limits  LIPASE, BLOOD  ETHANOL  MAGNESIUM  LAMOTRIGINE LEVEL    EKG EKG Interpretation  Date/Time:  Monday November 06 2022 19:24:37 EST Ventricular Rate:  128 PR Interval:  133 QRS Duration: 102 QT Interval:  347 QTC Calculation: 507 R Axis:   80 Text Interpretation: Sinus tachycardia with irregular rate Borderline ST depression, diffuse leads Borderline prolonged QT interval Confirmed by Sherwood Gambler [phone]) on 11/06/2022 10:09:56 PM  Radiology CT Head Wo Contrast  Result Date: 11/06/2022 CLINICAL DATA:  Seizure disorder EXAM: CT HEAD WITHOUT CONTRAST TECHNIQUE: Contiguous axial images were obtained from the base of the skull through the vertex without intravenous contrast. RADIATION DOSE REDUCTION: This exam was performed according to the departmental dose-optimization program which includes automated exposure control, adjustment of the mA and/or kV according to patient size and/or use of iterative reconstruction technique. COMPARISON:  12/11/2021 FINDINGS: Brain: No evidence of acute infarction, hemorrhage, hydrocephalus, extra-axial collection or mass lesion/mass effect. Vascular: No hyperdense vessel or unexpected calcification. Skull: Normal. Negative for fracture or focal lesion. Sinuses/Orbits: No acute finding. Other: None. IMPRESSION: No acute intracranial abnormality noted. Electronically Signed   By: Inez Catalina M.D.   On: 11/06/2022 22:25    Procedures Procedures     Medications Ordered in ED Medications  diazepam (VALIUM) injection 5 mg (5 mg Intravenous Given 11/06/22 1925)  lactated ringers bolus 1,000 mL (0 mLs Intravenous Stopped 11/06/22 2126)  levETIRAcetam (KEPPRA) IVPB 1000 mg/100 mL premix (0 mg Intravenous Stopped 11/06/22 2007)  metoCLOPramide (REGLAN) injection 10 mg (10 mg Intravenous Given 11/06/22 2001)  SUMAtriptan (IMITREX) injection 6 mg (6 mg Subcutaneous Given 11/06/22 2219)    ED Course/ Medical Decision Making/ A&P Clinical Course as of 11/06/22 2318  Mon Nov 06, 2022  1941 I discussed case with neuro, Dr. Curly Shores.  She recommends IV Keppra load, 2000 mg and we will put in a care plan.  Otherwise patient will need to be admitted for monitoring for further seizures.  Currently she is maintaining  her airway and doing well though a little sleepy after the IV Valium. [SG]  2154 Dr. Sidney Ace requests head CT. At this time she has a headache and is requesting imitrex. She normally gets headaches after seizures and this feels similar. Would normally treat with imitrex. She is not currently altered and feels back to her baseline. Will order CT head for hospitalist given their concern [SG]    Clinical Course User Index [SG] Sherwood Gambler, MD                             Medical Decision Making Amount and/or Complexity of Data Reviewed Labs: ordered.    Details: Mild leukocytosis.  Mildly elevated LFTs, slightly worse than most recent check.  Anion gap acidosis likely from seizure. Radiology: ordered and independent interpretation performed.    Details: No head bleed on head CT ECG/medicine tests: ordered and independent interpretation performed.    Details: Sinus tachycardia with mildly prolonged QTc.  Risk Prescription drug management. Decision regarding hospitalization.   Patient presents with multiple seizures today.  Most likely from noncompliance and alcohol abuse.  I do not think she is an actual alcohol withdrawal.  Her vital signs  are much better after treatment.  She is complaining of a headache as above and was given Imitrex and she has a longstanding history of these headaches.  As above neurology has given recommendations in the chart and we have loaded her with Keppra and she got a dose of Valium.  Otherwise she is gotten some fluids.  She has received antiemetics.  Highly doubt CNS infection.  She is back to her baseline on repeat evaluation (at first was mildly post-ictal). Discussed with Dr. Sidney Ace for admission.        Final Clinical Impression(s) / ED Diagnoses Final diagnoses:  Seizures [facility]    Rx / DC Orders ED Discharge Orders     None         Sherwood Gambler, MD 11/06/22 2319

## 2022-11-06 NOTE — Plan of Care (Signed)
Telephone/curbside discussion  Discussed patient with Dr. Verner Chol and briefly reviewed chart.  In brief this is a 52 year old with a past medical history significant for focal seizures with secondary generalization as well as alcohol abuse history.  She had been sober for several years but then binged for the last few days and in this setting was not adherent with her lamotrigine.  She has had multiple seizures today in that setting, but is coming around in the ED setting.  I was asked to review her chart and make antiseizure medication suggestions.  Notably she has not tolerated Keppra in the past due to mood issues, however her QTc is prolonged at this time making Vimpat not a very good option although she has done well on a combination of Vimpat and lamotrigine in the past she did taper provide due to "feeling high" combination per chart review  - Keppra 2000 mg load, then 1000 mg BID x 1 week, then 500 mg BID x 1 week then stop (please note despite warnings of our EMR is much less likely to cause QTc prolongation Vimpat) - As she has been off lamotrigine less than 1 week per history, would resume at home dose after confirmation (250 mg BID per notes)   - If it has been 1-2 weeks she has been off of lamotrigine, restart at half home dose for one week, then resume at home dose - If it has been > 2 weeks, restart dose escalation as if de novo (and will need longer cross taper of Keppra) - Admit to confirm seizure control - Correct any electrolyte derangements including checking magnesium - Check CK and monitor for rhabdomyolysis - Consider CIWA protocol in case she has had a longer history of recent alcohol use, consider empiric thiamine and B12 - Head CT if not fully back to baseline - EEG only needed if not returning to baseline or having further seizures - Additionally, full neurology consult if not returning to baseline or having further seizures after Keppra load  Lesleigh Noe  MD-PhD [facility] [phone] Telecoverage for emergent consults (code strokes, status) and brief curbside recommendations at [facility] [place] is from 8 AM to to 8 PM by [facility].  For a full routine consults [facility] neurohospitalist should be contacted; information can be found on AMION

## 2022-11-06 NOTE — ED Notes (Signed)
RN gave pt water, ok per Regenia Skeeter MD. Pt tolerated well

## 2022-11-07 DIAGNOSIS — Z888 Allergy status to other drugs, medicaments and biological substances status: Secondary | ICD-10-CM | POA: Diagnosis not present

## 2022-11-07 DIAGNOSIS — G40919 Epilepsy, unspecified, intractable, without status epilepticus: Secondary | ICD-10-CM

## 2022-11-07 DIAGNOSIS — M6282 Rhabdomyolysis: Secondary | ICD-10-CM | POA: Diagnosis present

## 2022-11-07 DIAGNOSIS — R413 Other amnesia: Secondary | ICD-10-CM | POA: Diagnosis present

## 2022-11-07 DIAGNOSIS — E871 Hypo-osmolality and hyponatremia: Secondary | ICD-10-CM | POA: Diagnosis present

## 2022-11-07 DIAGNOSIS — R9431 Abnormal electrocardiogram [ECG] [EKG]: Secondary | ICD-10-CM | POA: Diagnosis present

## 2022-11-07 DIAGNOSIS — R000 Tachycardia, unspecified: Secondary | ICD-10-CM | POA: Diagnosis present

## 2022-11-07 DIAGNOSIS — E8729 Other acidosis: Secondary | ICD-10-CM

## 2022-11-07 DIAGNOSIS — G40909 Epilepsy, unspecified, not intractable, without status epilepticus: Secondary | ICD-10-CM | POA: Diagnosis present

## 2022-11-07 DIAGNOSIS — E7800 Pure hypercholesterolemia, unspecified: Secondary | ICD-10-CM | POA: Diagnosis present

## 2022-11-07 DIAGNOSIS — F1210 Cannabis abuse, uncomplicated: Secondary | ICD-10-CM | POA: Diagnosis present

## 2022-11-07 DIAGNOSIS — R569 Unspecified convulsions: Secondary | ICD-10-CM | POA: Diagnosis present

## 2022-11-07 DIAGNOSIS — D72829 Elevated white blood cell count, unspecified: Secondary | ICD-10-CM | POA: Diagnosis present

## 2022-11-07 DIAGNOSIS — I10 Essential (primary) hypertension: Secondary | ICD-10-CM | POA: Diagnosis present

## 2022-11-07 DIAGNOSIS — R7401 Elevation of levels of liver transaminase levels: Secondary | ICD-10-CM | POA: Diagnosis present

## 2022-11-07 DIAGNOSIS — Z79899 Other long term (current) drug therapy: Secondary | ICD-10-CM | POA: Diagnosis not present

## 2022-11-07 DIAGNOSIS — F17290 Nicotine dependence, other tobacco product, uncomplicated: Secondary | ICD-10-CM | POA: Diagnosis present

## 2022-11-07 DIAGNOSIS — F1010 Alcohol abuse, uncomplicated: Secondary | ICD-10-CM

## 2022-11-07 DIAGNOSIS — R748 Abnormal levels of other serum enzymes: Secondary | ICD-10-CM | POA: Diagnosis present

## 2022-11-07 DIAGNOSIS — F419 Anxiety disorder, unspecified: Secondary | ICD-10-CM | POA: Diagnosis present

## 2022-11-07 DIAGNOSIS — F32A Depression, unspecified: Secondary | ICD-10-CM | POA: Diagnosis present

## 2022-11-07 DIAGNOSIS — E872 Acidosis, unspecified: Secondary | ICD-10-CM | POA: Diagnosis present

## 2022-11-07 DIAGNOSIS — Z885 Allergy status to narcotic agent status: Secondary | ICD-10-CM | POA: Diagnosis not present

## 2022-11-07 LAB — CBC
HCT: 36.6 % (ref 36.0–46.0)
Hemoglobin: 12.5 g/dL (ref 12.0–15.0)
MCH: 28.7 pg (ref 26.0–34.0)
MCHC: 34.2 g/dL (ref 30.0–36.0)
MCV: 84.1 fL (ref 80.0–100.0)
Platelets: 178 10*3/uL (ref 150–400)
RBC: 4.35 MIL/uL (ref 3.87–5.11)
RDW: 13.3 % (ref 11.5–15.5)
WBC: 9 10*3/uL (ref 4.0–10.5)
nRBC: 0 % (ref 0.0–0.2)

## 2022-11-07 LAB — COMPREHENSIVE METABOLIC PANEL
ALT: 78 U/L — ABNORMAL HIGH (ref 0–44)
AST: 36 U/L (ref 15–41)
Albumin: 4.1 g/dL (ref 3.5–5.0)
Alkaline Phosphatase: 94 U/L (ref 38–126)
Anion gap: 16 — ABNORMAL HIGH (ref 5–15)
BUN: 14 mg/dL (ref 6–20)
CO2: 16 mmol/L — ABNORMAL LOW (ref 22–32)
Calcium: 8.5 mg/dL — ABNORMAL LOW (ref 8.9–10.3)
Chloride: 100 mmol/L (ref 98–111)
Creatinine, Ser: 0.99 mg/dL (ref 0.44–1.00)
GFR, Estimated: >60 mL/min (ref >60)
Glucose, Bld: 95 mg/dL (ref 70–99)
Potassium: 4.1 mmol/L (ref 3.5–5.1)
Sodium: 132 mmol/L — ABNORMAL LOW (ref 135–145)
Total Bilirubin: 1.7 mg/dL — ABNORMAL HIGH (ref 0.3–1.2)
Total Protein: 6.3 g/dL — ABNORMAL LOW (ref 6.5–8.1)

## 2022-11-07 LAB — OSMOLALITY: Osmolality: 288 mOsm/kg (ref 275–295)

## 2022-11-07 LAB — LACTIC ACID, PLASMA: Lactic Acid, Venous: 1.1 mmol/L (ref 0.5–1.9)

## 2022-11-07 LAB — CK: Total CK: 677 U/L — ABNORMAL HIGH (ref 38–234)

## 2022-11-07 LAB — HIV ANTIBODY (ROUTINE TESTING W REFLEX): HIV Screen 4th Generation wRfx: NONREACTIVE

## 2022-11-07 MED ORDER — LAMOTRIGINE 100 MG PO TABS
250.0000 mg | ORAL_TABLET | Freq: Two times a day (BID) | ORAL | Status: DC
  Administered 2022-11-07 – 2022-11-09 (×6): 250 mg via ORAL
  Filled 2022-11-07 (×6): qty 3

## 2022-11-07 MED ORDER — CLONAZEPAM 0.5 MG PO TABS
0.5000 mg | ORAL_TABLET | Freq: Three times a day (TID) | ORAL | Status: DC
  Administered 2022-11-07 – 2022-11-09 (×6): 0.5 mg via ORAL
  Filled 2022-11-07 (×6): qty 1

## 2022-11-07 MED ORDER — LORAZEPAM 1 MG PO TABS: 1.0000 mg | ORAL_TABLET | ORAL | Status: DC | PRN

## 2022-11-07 MED ORDER — SODIUM CHLORIDE 0.9 % IV SOLN: INTRAVENOUS | Status: AC

## 2022-11-07 MED ORDER — FOLIC ACID 1 MG PO TABS
1.0000 mg | ORAL_TABLET | Freq: Every day | ORAL | Status: DC
  Administered 2022-11-07 – 2022-11-09 (×3): 1 mg via ORAL
  Filled 2022-11-07 (×3): qty 1

## 2022-11-07 MED ORDER — ADULT MULTIVITAMIN W/MINERALS CH
1.0000 | ORAL_TABLET | Freq: Every day | ORAL | Status: DC
  Administered 2022-11-07 – 2022-11-09 (×3): 1 via ORAL
  Filled 2022-11-07 (×3): qty 1

## 2022-11-07 MED ORDER — KETOROLAC TROMETHAMINE 15 MG/ML IJ SOLN
15.0000 mg | Freq: Four times a day (QID) | INTRAMUSCULAR | Status: DC | PRN
  Administered 2022-11-07: 15 mg via INTRAVENOUS
  Filled 2022-11-07: qty 1

## 2022-11-07 MED ORDER — THIAMINE HCL 100 MG/ML IJ SOLN: 100.0000 mg | Freq: Every day | INTRAMUSCULAR | Status: DC

## 2022-11-07 MED ORDER — SODIUM CHLORIDE 0.9 % IV SOLN: INTRAVENOUS | Status: DC

## 2022-11-07 MED ORDER — HEPARIN SODIUM (PORCINE) 5000 UNIT/ML IJ SOLN
5000.0000 [IU] | Freq: Three times a day (TID) | INTRAMUSCULAR | Status: DC
  Administered 2022-11-07 – 2022-11-09 (×7): 5000 [IU] via SUBCUTANEOUS
  Filled 2022-11-07 (×7): qty 1

## 2022-11-07 MED ORDER — CARVEDILOL 6.25 MG PO TABS
6.2500 mg | ORAL_TABLET | Freq: Two times a day (BID) | ORAL | Status: DC
  Administered 2022-11-07 – 2022-11-09 (×5): 6.25 mg via ORAL
  Filled 2022-11-07 (×5): qty 1

## 2022-11-07 MED ORDER — LEVETIRACETAM 500 MG PO TABS
1000.0000 mg | ORAL_TABLET | Freq: Two times a day (BID) | ORAL | Status: DC
  Administered 2022-11-07 – 2022-11-08 (×3): 1000 mg via ORAL
  Filled 2022-11-07 (×3): qty 2

## 2022-11-07 MED ORDER — LORAZEPAM 2 MG/ML IJ SOLN
1.0000 mg | INTRAMUSCULAR | Status: DC | PRN
  Administered 2022-11-09: 4 mg via INTRAVENOUS
  Filled 2022-11-07: qty 2

## 2022-11-07 MED ORDER — VITAMIN D (ERGOCALCIFEROL) 1.25 MG (50000 UNIT) PO CAPS
50000.0000 [IU] | ORAL_CAPSULE | ORAL | Status: DC
  Administered 2022-11-07: 50000 [IU] via ORAL
  Filled 2022-11-07: qty 1

## 2022-11-07 MED ORDER — THIAMINE MONONITRATE 100 MG PO TABS
100.0000 mg | ORAL_TABLET | Freq: Every day | ORAL | Status: DC
  Administered 2022-11-07 – 2022-11-09 (×3): 100 mg via ORAL
  Filled 2022-11-07 (×3): qty 1

## 2022-11-07 MED ORDER — KETOROLAC TROMETHAMINE 15 MG/ML IJ SOLN
15.0000 mg | Freq: Once | INTRAMUSCULAR | Status: AC
  Administered 2022-11-07: 15 mg via INTRAVENOUS
  Filled 2022-11-07: qty 1

## 2022-11-07 MED ORDER — SUMATRIPTAN SUCCINATE 100 MG PO TABS
100.0000 mg | ORAL_TABLET | Freq: Every day | ORAL | Status: DC | PRN
  Filled 2022-11-07: qty 1

## 2022-11-07 MED ORDER — METOCLOPRAMIDE HCL 5 MG/ML IJ SOLN
10.0000 mg | Freq: Four times a day (QID) | INTRAMUSCULAR | Status: DC | PRN
  Administered 2022-11-07 (×2): 10 mg via INTRAVENOUS
  Filled 2022-11-07 (×3): qty 2

## 2022-11-07 NOTE — Consult Note (Addendum)
NEURO HOSPITALIST CONSULT NOTE   Requesting physician: Dr. Candiss Norse  Reason for Consult: Seizure activity  History obtained from:  Patient and Chart     HPI:                                                                                                                                          Meredith Park is a 52 y.o. female with a PMHx of EtOH abuse and withdrawal, hypercholesterolemia, HTN and complicated seizures who presented to the [facility] ED yesterday evening after having had 2-3 seizures the same day. She endorsed having missed her Lamotrigine for the past 2 days, except for one pill on the Monday morning. She stated that she had been 4 years sober until 3 days prior to presenting, after which she had been consuming 1/2 gallon ETOH daily x 3 days, then quit again on Sunday. She had been vomiting all day Monday. While still in the ED lobby, she started actively seizing. The patient was having twitching and shaking of her right face and right arm which then developed into a generalized tonic/clonic seizure. EtOH level in the ED was < 10. EDP felt that clinically, she was not in actual EtOH withdrawal. She was complaining of a headache. Neurology was called and Keppra load was recommended. She was mildly postictal on repeat evaluation by EDP.    Assessment per Dr. Curly Shores was as follows:  "Notably she has not tolerated Keppra in the past due to mood issues, however her QTc is prolonged at this time making Vimpat not a very good option although she has done well on a combination of Vimpat and lamotrigine in the past she did taper provide due to "feeling high" combination per chart review."   Plan per Dr. Curly Shores was to continue Keppra at 1000 mg BID x 1 week, then 500 mg BID x 1 week then stop. It was also recommended that she be resumed on her home dose of lamotrigine given that she had only been off lamotrigine for less than 1 week. Lamotrigine dosing was to  be continued at 250 mg BID. It was also recommended to admit to confirm seizure control, correct any electrolyte derangements including checking magnesium, to check CK for monitoring of possible rhabdomyolysis, and to consider CIWA protocol in case she has had a longer history of recent alcohol use. It was also recommended to consider empiric thiamine and B12. VPA and Dilantin were not good options given her EtOH consumption history.   The patient now endorses having breakthrough seizures 1 or 2 episodes every month. Also claims that she has breakthrough seizures regardless of seizure medication compliance once or twice a month. She endorsed allergy to Keppra and inability to tolerate it.  [facility] follows at [facility] clinic.   Past Medical History:  Diagnosis Date   Alcohol abuse    Alcohol withdrawal ([place]) 08/28/2018   High cholesterol    Hypertension    Seizures ([place])     History reviewed. No pertinent surgical history.  Family History  Problem Relation Age of Onset   Sleep apnea Neg Hx              Social History:  reports that she has never smoked. She has never used smokeless tobacco. She reports current alcohol use. She reports that she does not currently use drugs after having used the following drugs: Marijuana. She does vape nicotine.   Allergies  Allergen Reactions   Losartan Potassium-Hctz     "Out of mind"/high ammonia level   Meperidine Itching    MEDICATIONS:                                                                                                                      Scheduled:  carvedilol  6.25 mg Oral BID   folic acid  1 mg Oral Daily   heparin injection (subcutaneous)  5,000 Units Subcutaneous Q8H   ketorolac  15 mg Intravenous Once   lamoTRIgine  250 mg Oral BID   levETIRAcetam  1,000 mg Oral BID   multivitamin with minerals  1 tablet Oral Daily   thiamine  100 mg Oral Daily   Or   thiamine  100 mg Intravenous Daily   Vitamin D  (Ergocalciferol)  50,000 Units Oral Weekly   Continuous:  sodium chloride 100 mL/hr at 11/07/22 0713     ROS:                                                                                                                                       Not currently having any withdrawal symptoms. No current complaints other than as documented in the HPI.    Blood pressure (!) 159/90, pulse 100, temperature 98.1 F (36.7 C), temperature source Oral, resp. rate (!) 21, height '5\' 2"'$  (1.575 m), weight 105.9 kg, last menstrual period 08/28/2020, SpO2 98 %.   General Examination:  Physical Exam  HEENT-  Normocephalic, no lesions, without obvious abnormality.  Normal external eye and conjunctiva.   Cardiovascular-regular rhythm on the monitor Lungs-unlabored respirations on room air Extremities- Warm, dry and intact Musculoskeletal-no joint tenderness, deformity or swelling Skin-warm and dry, no hyperpigmentation, vitiligo, or suspicious lesions  Neurological Examination Mental Status: Alert, oriented, thought content appropriate.  Good insight and judgment and recall of recent events.  Speech fluent without evidence of aphasia.  Able to follow 3 step commands without difficulty. Cranial Nerves: II: Pupils equal round and reactive III,IV, VI: ptosis not present, extra-ocular motions intact bilaterally  V,VII: smile symmetric, facial light touch sensation normal bilaterally VIII: hearing normal bilaterally IX,X: Phonation normal XI: bilateral shoulder shrug metrical XII: midline tongue extension Motor: Right : Upper extremity   5/5    Left:     Upper extremity   5/5  Lower extremity   5/5     Lower extremity   5/5 Tone and bulk:normal tone throughout; no atrophy noted Sensory: light touch intact and symmetrical throughout, bilaterally Cerebellar: normal finger-to-nose bilaterally Gait:  Deferred   Lab Results: Basic Metabolic Panel: Recent Labs  Lab 11/06/22 1841 11/06/22 1931 11/07/22 0545  NA 131*  --  132*  K 4.3  --  4.1  CL 97*  --  100  CO2 14*  --  16*  GLUCOSE 159*  --  95  BUN 17  --  14  CREATININE 1.03*  --  0.99  CALCIUM 9.0  --  8.5*  MG  --  2.4  --     CBC: Recent Labs  Lab 11/06/22 1841 11/07/22 0545  WBC 12.6* 9.0  HGB 14.7 12.5  HCT 42.9 36.6  MCV 81.4 84.1  PLT 323 178    Cardiac Enzymes: Recent Labs  Lab 11/06/22 1931 11/07/22 0545  CKTOTAL 476* 677*   Lamotrigine level pending  Lipid Panel: No results for input(s): "CHOL", "TRIG", "HDL", "CHOLHDL", "VLDL", "LDLCALC" in the last 168 hours.  Imaging: CT Head Wo Contrast  Result Date: 11/06/2022 CLINICAL DATA:  Seizure disorder EXAM: CT HEAD WITHOUT CONTRAST TECHNIQUE: Contiguous axial images were obtained from the base of the skull through the vertex without intravenous contrast. RADIATION DOSE REDUCTION: This exam was performed according to the departmental dose-optimization program which includes automated exposure control, adjustment of the mA and/or kV according to patient size and/or use of iterative reconstruction technique. COMPARISON:  12/11/2021 FINDINGS: Brain: No evidence of acute infarction, hemorrhage, hydrocephalus, extra-axial collection or mass lesion/mass effect. Vascular: No hyperdense vessel or unexpected calcification. Skull: Normal. Negative for fracture or focal lesion. Sinuses/Orbits: No acute finding. Other: None. IMPRESSION: No acute intracranial abnormality noted. Electronically Signed   By: Inez Catalina M.D.   On: 11/06/2022 22:25     Assessment: 52 year old patient with history of alcohol abuse, hyperlipidemia, hypertension and seizures on Lamictal at home presented after having 2 or 3 seizures in the setting of a relapse of alcohol use and missing several doses of Lamictal.  At baseline, patient has 1-2 breakthrough seizures a month despite usual  compliance with Lamictal.   - CT head shows no acute abnormalities.   - Patient has no history of serious head injury, meningitis, encephalitis or other seizure risk factors.   - Patient has had difficulty tolerating different antiepileptic drugs in the past.  She is unable to take Keppra due to symptoms of irritability and intense anger which occurred after only 4 days of use previously. - She has not been  able to tolerate Vimpat in the past and her QTc is also prolonged. - Valproic acid and Dilantin are not good options given EtOH consumption history and elevated liver enzymes.   - Will start clonazepam 0.5 mg every 8 hours as an adjunct antiepileptic medication.  Recommendations: -Continue Lamictal 250 mg twice daily -Start clonazepam 0.5 mg every 8 hours -Continue seizure precautions including no driving until seizure free for 6 months.  -Outpatient Neurology follow up -Neurohospitalist service will sign off. Please call if there are additional questions.    Patient seen by NP and then by MD,  Cortney E Carron Curie , MSN, AGACNP-BC [facility] 11/07/2022 1:18 PM   Electronically signed: Dr. Kerney Elbe 11/07/2022, 8:29 AM

## 2022-11-07 NOTE — H&P (Signed)
History and Physical    Meredith Park [id number] DOB: [birth year] DOA: 11/06/2022  PCP: Elisabeth Cara, PA-C  Patient coming from: [facility] ED  Chief Complaint: Seizures  HPI: Meredith Park is a 52 y.o. female with medical history significant of seizure disorder, alcohol abuse, hypertension, hyperlipidemia, cannabis abuse, anxiety, depression presented to the ED after 2 or 3 seizures at home.  Reportedly binge drinking for several days and in this setting was not adherent with her lamotrigine.  Afebrile.  Tachycardic and hypertensive on arrival to the ED. While in the waiting room, she had another seizure.  Labs significant for WBC 12.6, sodium 131, bicarb 14, anion gap 20, glucose 159, creatinine 1.0, AST 57, ALT 103, alk phos 119, T. bili 1.9, lipase normal, blood ethanol level undetectable, UDS positive for THC, magnesium 2.4, CK 476, UA not suggestive of infection.  CT head negative for acute finding. ED physician discussed the case with neurology on the phone, recommended Keppra 2000 mg loading dose, then 1000 mg twice daily x 1 week then stop.  Recommended resuming her home dose of lamotrigine after confirmation if she has been off lamotrigine for less than a week but if patient has not taken it for longer than a week then restart at one half of home dose x 1 week, then resume at home dose.  Recommended holding Vimpat due to QT prolongation.  Recommended EEG only if not returning to baseline or having further seizures.  Patient received Valium 5 mg, Reglan, Imitrex, IV Keppra 2000 mg, and 1 L LR bolus in the ED.  Lamotrigine level pending.  Patient states she was an alcoholic and has been sober for the past 4 years until this weekend on Friday when she started binge drinking vodka again.  Last drink was >24 hours ago.  States she is no longer on Vimpat and her current home seizure medication is lamotrigine 250 mg twice daily.  She has not taken lamotrigine for the past 3 or 4 days  since she started drinking heavily again.  She reports at least 2 seizures at home and another one in the emergency room.  Also reports headaches.  Denies fevers, cough, shortness breath, or chest pain.  Review of Systems:  Review of Systems  All other systems reviewed and are negative.   Past Medical History:  Diagnosis Date   Alcohol abuse    Alcohol withdrawal ([place]) 08/28/2018   High cholesterol    Hypertension    Seizures ([place])     History reviewed. No pertinent surgical history.   reports that she has never smoked. She has never used smokeless tobacco. She reports current alcohol use. She reports that she does not currently use drugs after having used the following drugs: Marijuana.  Allergies  Allergen Reactions   Losartan Potassium-Hctz     "Out of mind"/high ammonia level   Meperidine Itching    Family History  Problem Relation Age of Onset   Sleep apnea Neg Hx     Prior to Admission medications   Medication Sig Start Date End Date Taking? Authorizing Provider  carvedilol (COREG) 12.5 MG tablet Take 1 tablet (12.5 mg total) by mouth 2 (two) times daily. 08/24/17   Domenic Moras, PA-C  lacosamide (VIMPAT) 200 MG TABS tablet Take 1 tablet (200 mg total) by mouth 2 (two) times daily. take 3 tablets (150 mg total) in the morning and 3 tablets (150 mg total) every evening. 04/10/22 04/05/23  Alric Ran, MD  losartan (COZAAR)  50 MG tablet Take 50 mg by mouth daily. Take only if blood pressure is over 139. 07/12/21   [provider]  Midazolam (NAYZILAM) 5 MG/0.1ML SOLN Place 5 mg into the nose as needed. 04/10/22   Alric Ran, MD  Multiple Vitamin (MULTIVITAMIN WITH MINERALS) TABS tablet Take 1 tablet by mouth daily. 08/30/18   Aline August, MD  SUMAtriptan (IMITREX) 100 MG tablet Take 1 tablet (100 mg total) by mouth daily as needed for migraine. 12/11/21   Gareth Morgan, MD  Vitamin D, Ergocalciferol, (DRISDOL) 1.25 MG (50000 UNIT) CAPS capsule Take 50,000 Units  by mouth once a week. Tuesdays 09/03/21   [provider]    Physical Exam: Vitals:   11/06/22 2130 11/06/22 2200 11/06/22 2239 11/07/22 0045  BP: 125/76 (!) 154/90  (!) 148/88  Pulse: 90 87  100  Resp: (!) 22 13  (!) 21  Temp:   98.4 F (36.9 C)   TempSrc:   Oral   SpO2: 98% 95%  98%  Weight:      Height:        Physical Exam Vitals reviewed.  Constitutional:      General: She is not in acute distress. HENT:     Head: Normocephalic and atraumatic.  Eyes:     Extraocular Movements: Extraocular movements intact.  Cardiovascular:     Rate and Rhythm: Normal rate and regular rhythm.     Pulses: Normal pulses.  Pulmonary:     Effort: Pulmonary effort is normal. No respiratory distress.     Breath sounds: Normal breath sounds. No wheezing or rales.  Abdominal:     General: Bowel sounds are normal. There is no distension.     Palpations: Abdomen is soft.     Tenderness: There is no abdominal tenderness.  Musculoskeletal:     Cervical back: Normal range of motion.     Right lower leg: No edema.     Left lower leg: No edema.  Skin:    General: Skin is warm and dry.  Neurological:     General: No focal deficit present.     Mental Status: She is alert and oriented to person, place, and time.     Labs on Admission: I have personally reviewed following labs and imaging studies  CBC: Recent Labs  Lab 11/06/22 1841  WBC 12.6*  HGB 14.7  HCT 42.9  MCV 81.4  PLT [id number]   Basic Metabolic Panel: Recent Labs  Lab 11/06/22 1841 11/06/22 1931  NA 131*  --   K 4.3  --   CL 97*  --   CO2 14*  --   GLUCOSE 159*  --   BUN 17  --   CREATININE 1.03*  --   CALCIUM 9.0  --   MG  --  2.4   GFR: Estimated Creatinine Clearance: 73.9 mL/min (A) (by C-G formula based on SCr of 1.03 mg/dL (H)). Liver Function Tests: Recent Labs  Lab 11/06/22 1841  AST 57*  ALT 103*  ALKPHOS 119  BILITOT 1.9*  PROT 7.5  ALBUMIN 4.8   Recent Labs  Lab 11/06/22 1841  LIPASE  26   No results for input(s): "AMMONIA" in the last 168 hours. Coagulation Profile: No results for input(s): "INR", "PROTIME" in the last 168 hours. Cardiac Enzymes: Recent Labs  Lab 11/06/22 1931  CKTOTAL 476*   BNP (last 3 results) No results for input(s): "PROBNP" in the last 8760 hours. HbA1C: No results for input(s): "HGBA1C" in the  last 72 hours. CBG: No results for input(s): "GLUCAP" in the last 168 hours. Lipid Profile: No results for input(s): "CHOL", "HDL", "LDLCALC", "TRIG", "CHOLHDL", "LDLDIRECT" in the last 72 hours. Thyroid Function Tests: No results for input(s): "TSH", "T4TOTAL", "FREET4", "T3FREE", "THYROIDAB" in the last 72 hours. Anemia Panel: No results for input(s): "VITAMINB12", "FOLATE", "FERRITIN", "TIBC", "IRON", "RETICCTPCT" in the last 72 hours. Urine analysis:    Component Value Date/Time   COLORURINE YELLOW 11/06/2022 1856   APPEARANCEUR CLEAR 11/06/2022 1856   LABSPEC >=1.030 11/06/2022 1856   PHURINE 5.5 11/06/2022 1856   GLUCOSEU NEGATIVE 11/06/2022 1856   HGBUR SMALL (A) 11/06/2022 1856   BILIRUBINUR NEGATIVE 11/06/2022 1856   KETONESUR 80 (A) 11/06/2022 1856   PROTEINUR 100 (A) 11/06/2022 1856   NITRITE NEGATIVE 11/06/2022 1856   LEUKOCYTESUR NEGATIVE 11/06/2022 1856    Radiological Exams on Admission: CT Head Wo Contrast  Result Date: 11/06/2022 CLINICAL DATA:  Seizure disorder EXAM: CT HEAD WITHOUT CONTRAST TECHNIQUE: Contiguous axial images were obtained from the base of the skull through the vertex without intravenous contrast. RADIATION DOSE REDUCTION: This exam was performed according to the departmental dose-optimization program which includes automated exposure control, adjustment of the mA and/or kV according to patient size and/or use of iterative reconstruction technique. COMPARISON:  12/11/2021 FINDINGS: Brain: No evidence of acute infarction, hemorrhage, hydrocephalus, extra-axial collection or mass lesion/mass effect. Vascular:  No hyperdense vessel or unexpected calcification. Skull: Normal. Negative for fracture or focal lesion. Sinuses/Orbits: No acute finding. Other: None. IMPRESSION: No acute intracranial abnormality noted. Electronically Signed   By: Inez Catalina M.D.   On: 11/06/2022 22:25    EKG: Independently reviewed.  Sinus tachycardia, QTc 507.  Assessment and Plan  Breakthrough seizures Patient was binge drinking for the past 3 or 4 days and in this setting was not adherent with her lamotrigine.  UDS positive for THC.  Magnesium level normal.  CT head negative for acute finding.  Appreciate neurology recommendations.  Patient received loading dose IV Keppra 2000 mg in the ED, continue Keppra 1000 mg twice daily x 1 week.  Continue her home dose of lamotrigine 250 mg twice daily. Lamotrigine level pending.  Hold Vimpat due to QT prolongation per neurology.  EEG only if not returning to baseline or having further seizures per neurology.  Seizure precautions.  Alcohol abuse CIWA protocol; Ativan as needed.  Thiamine, folate, and multivitamin.  QT prolongation QTc 507.  Potassium and magnesium within normal range.  Cardiac monitoring and avoid QT prolonging drugs.  Hold for Vimpat as recommended by neurology.  Repeat EKG in a.m.  Mild leukocytosis Likely reactive, no infectious signs or symptoms at this time.  Repeat CBC in a.m.  Mild hyponatremia IV fluid hydration and continue to monitor.  Check serum osmolarity.  Elevated CK/mild rhabdomyolysis Due to seizures.  Continue IV fluid hydration and trend CK.  High anion gap metabolic acidosis Likely due to seizures, check lactate.  Continue IV fluid hydration and repeat metabolic panel.  Elevated liver enzymes Likely due to mild rhabdomyolysis and alcohol abuse.  Patient is not endorsing any abdominal pain, nausea, or vomiting at this time.  Continue to monitor LFTs.  Hypertension: Systolic currently in the 140s. Hyperlipidemia Anxiety and  depression Pharmacy med rec pending.  DVT prophylaxis: SCDs Code Status: Full Code (discussed with the patient) Family Communication: No family available at this time. Level of care: Telemetry bed Admission status: It is my clinical opinion that admission to INPATIENT is reasonable and necessary  because of the expectation that this patient will require hospital care that crosses at least 2 midnights to treat this condition based on the medical complexity of the problems presented.  Given the aforementioned information, the predictability of an adverse outcome is felt to be significant.   Shela Leff MD [facility]  If 7PM-7AM, please contact night-coverage [email or web address]  11/07/2022, 1:36 AM

## 2022-11-07 NOTE — Progress Notes (Signed)
PROGRESS NOTE                                                                                                                                                                                                             Patient Demographics:    Meredith Park, is a 52 y.o. female, DOB - [birth year], [id number]  Outpatient Primary MD for the patient is Belva Bertin, [place], PA-C    LOS - 0  Admit date - 11/06/2022    Chief Complaint  Patient presents with   Seizures       Brief Narrative (HPI from H&P)   52 y.o. female with medical history significant of seizure disorder that she has 1-2 breakthrough seizures every month and follows at [facility] neurology office, alcohol abuse, hypertension, hyperlipidemia, cannabis abuse, anxiety, depression presented to the ED after 2 or 3 seizures at home.  Reportedly binge drinking for several days and in this setting was not adherent with her lamotrigine, in the ER she was found to be dehydrated, head CT was negative, she was admitted for breakthrough seizures and alcohol overuse.   Subjective:    Meredith Park today has, mild generalized headache, No chest pain, No abdominal pain - No Nausea, No new weakness tingling or numbness, no SOB.   Assessment  & Plan :    Breakthrough seizures - Patient was binge drinking for the past 3 or 4 days and in this setting was not adherent with her lamotrigine. UDS positive for THC.  Magnesium level normal.  CT head negative for acute finding.  She has been started on Keppra and Lamictal per neurology recommendation along with CIWA protocol, patient claims she has Keppra allergy, currently close to her baseline will request neurology to provide medication assistance have informed neurologist on-call Dr. Cheral Marker.   Alcohol abuse CIWA protocol; Ativan as needed.  Thiamine, folate, and multivitamin.  Is been counseled to quit alcohol and all  recreational drug use.   QT prolongation QTc 507.  Electrolytes were replaced and resolved on EKG on 11/07/2022.   Mild leukocytosis Likely reactive, no infectious signs or symptoms at this time.  Repeat CBC in a.m.   Mild hyponatremia IV fluid hydration and continue to monitor.  Check serum osmolarity.   Elevated CK/mild rhabdomyolysis Due to seizures.  Continue IV fluid hydration and trend CK.  High anion gap metabolic acidosis Likely due to seizures, check lactate.  Continue IV fluid hydration and repeat metabolic panel.   Elevated liver enzymes Likely due to mild rhabdomyolysis and alcohol abuse.  Patient is not endorsing any abdominal pain, nausea, or vomiting at this time.  Continue to monitor LFTs.   Hypertension: Resumed Coreg at lower than home dose Hyperlipidemia -  statin. Anxiety and depression Currently on CIWA protocol.        Condition - Fair  Family Communication  :  None  Code Status :  None  Consults  :  Neuro -Phone  PUD Prophylaxis :    Procedures  :     CT Head - Non acute      Disposition Plan  :    Status is: Inpatient   DVT Prophylaxis  :  Heparin  SCDs Start: 11/07/22 0239    Lab Results  Component Value Date   PLT 178 11/07/2022    Diet :  Diet Order             Diet Heart Room service appropriate? Yes; Fluid consistency: Thin  Diet effective now                    Inpatient Medications  Scheduled Meds:  carvedilol  6.25 mg Oral BID   folic acid  1 mg Oral Daily   ketorolac  15 mg Intravenous Once   lamoTRIgine  250 mg Oral BID   levETIRAcetam  1,000 mg Oral BID   multivitamin with minerals  1 tablet Oral Daily   thiamine  100 mg Oral Daily   Or   thiamine  100 mg Intravenous Daily   Vitamin D (Ergocalciferol)  50,000 Units Oral Weekly   Continuous Infusions:  sodium chloride 100 mL/hr at 11/07/22 0713   PRN Meds:.ketorolac, LORazepam **OR** LORazepam, metoCLOPramide (REGLAN) injection,  SUMAtriptan  Antibiotics  :    Anti-infectives (From admission, onward)    None         Objective:   Vitals:   11/06/22 2130 11/06/22 2200 11/06/22 2239 11/07/22 0045  BP: 125/76 (!) 154/90  (!) 148/88  Pulse: 90 87  100  Resp: (!) 22 13  (!) 21  Temp:   98.4 F (36.9 C)   TempSrc:   Oral   SpO2: 98% 95%  98%  Weight:      Height:        Wt Readings from Last 3 Encounters:  11/06/22 105.9 kg  04/10/22 105.9 kg  02/08/22 108 kg     Intake/Output Summary (Last 24 hours) at 11/07/2022 0753 Last data filed at 11/06/2022 2126 Gross per 24 hour  Intake 1200 ml  Output --  Net 1200 ml     Physical Exam  Awake Alert, No new F.N deficits, Normal affect [place].AT,PERRAL Supple Neck, No JVD,   Symmetrical Chest wall movement, Good air movement bilaterally, CTAB RRR,No Gallops,Rubs or new Murmurs,  +ve B.Sounds, Abd Soft, No tenderness,   No Cyanosis, Clubbing or edema        Data Review:    Recent Labs  Lab 11/06/22 1841 11/07/22 0545  WBC 12.6* 9.0  HGB 14.7 12.5  HCT 42.9 36.6  PLT 323 178  MCV 81.4 84.1  MCH 27.9 28.7  MCHC 34.3 34.2  RDW 13.7 13.3    Recent Labs  Lab 11/06/22 1841 11/06/22 1931 11/07/22 0545  NA 131*  --  132*  K 4.3  --  4.1  CL 97*  --  100  CO2 14*  --  16*  ANIONGAP 20*  --  16*  GLUCOSE 159*  --  95  BUN 17  --  14  CREATININE 1.03*  --  0.99  AST 57*  --  36  ALT 103*  --  78*  ALKPHOS 119  --  94  BILITOT 1.9*  --  1.7*  ALBUMIN 4.8  --  4.1  LATICACIDVEN  --   --  1.1  MG  --  2.4  --   CALCIUM 9.0  --  8.5*    Radiology Reports CT Head Wo Contrast  Result Date: 11/06/2022 CLINICAL DATA:  Seizure disorder EXAM: CT HEAD WITHOUT CONTRAST TECHNIQUE: Contiguous axial images were obtained from the base of the skull through the vertex without intravenous contrast. RADIATION DOSE REDUCTION: This exam was performed according to the departmental dose-optimization program which includes automated exposure control,  adjustment of the mA and/or kV according to patient size and/or use of iterative reconstruction technique. COMPARISON:  12/11/2021 FINDINGS: Brain: No evidence of acute infarction, hemorrhage, hydrocephalus, extra-axial collection or mass lesion/mass effect. Vascular: No hyperdense vessel or unexpected calcification. Skull: Normal. Negative for fracture or focal lesion. Sinuses/Orbits: No acute finding. Other: None. IMPRESSION: No acute intracranial abnormality noted. Electronically Signed   By: Inez Catalina M.D.   On: 11/06/2022 22:25      Signature  -   Lala Lund M.D on 11/07/2022 at 7:53 AM   -  To page go to [email or web address]

## 2022-11-08 MED ORDER — SODIUM CHLORIDE 0.9 % IV SOLN: INTRAVENOUS | Status: AC

## 2022-11-08 NOTE — Evaluation (Signed)
Occupational Therapy Evaluation Patient Details Name: Meredith Park MRN: [id number] DOB: 09-16-1970 Today's Date: 11/08/2022   History of Present Illness Meredith Park is a 52 yo female who presented to the ED after 2 or 3 seizures at home after reportedly binge drinking for several days and in this setting was not adherent with her lamotrigine. PMHx: seizure disorder that she has 1-2 breakthrough seizures every month, alcohol abuse, hypertension, hyperlipidemia, cannabis abuse, anxiety, depression   Clinical Impression   Meredith Park was evaluated s/p the above admission list. She is indep at baseline and works from home. Upon evaluation she was indep with mobility and ADLs, no LOB or safety concern noted. Pt reported STM deficits that worsen with each seizure, and is interested in OP services to address this. SLP cognitive consult recommended. Pt does not need further OT services, will sign off. Recommend d/c to home with support of family as needed.       Recommendations for follow up therapy are one component of a multi-disciplinary discharge planning process, led by the attending physician.  Recommendations may be updated based on patient status, additional functional criteria and insurance authorization.   Follow Up Recommendations  No OT follow up     Assistance Recommended at Discharge PRN  Patient can return home with the following Assist for transportation    Functional Status Assessment  Patient has had a recent decline in their functional status and demonstrates the ability to make significant improvements in function in a reasonable and predictable amount of time.  Equipment Recommendations  None recommended by OT    Recommendations for Other Services Speech consult (Cognitive evaluation)     Precautions / Restrictions Precautions Precautions: Fall Restrictions Weight Bearing Restrictions: No      Mobility Bed Mobility Overal bed mobility: Independent                   Transfers Overall transfer level: Independent                        Balance Overall balance assessment: No apparent balance deficits (not formally assessed)                                         ADL either performed or assessed with clinical judgement   ADL Overall ADL's : Independent                                       General ADL Comments: Pt up in room completing ADLs upon arrival, no AD, no LOB no safety concerns noted.     Vision Baseline Vision/History: 0 No visual deficits Vision Assessment?: No apparent visual deficits     Perception Perception Perception Tested?: No   Praxis Praxis Praxis tested?: Not tested    Pertinent Vitals/Pain       Hand Dominance Right   Extremity/Trunk Assessment Upper Extremity Assessment Upper Extremity Assessment: Overall WFL for tasks assessed   Lower Extremity Assessment Lower Extremity Assessment: Overall WFL for tasks assessed   Cervical / Trunk Assessment Cervical / Trunk Assessment: Normal   Communication Communication Communication: No difficulties   Cognition Arousal/Alertness: Awake/alert Behavior During Therapy: WFL for tasks assessed/performed Overall Cognitive Status: Impaired/Different from baseline Area of Impairment: Memory  Memory: Decreased short-term memory         General Comments: Overall WFl for tasks assessed. Pt reports that with each seizure she notices that her STM worsens; she is interested in OP services for cognition     General Comments  VSS on RA    Exercises     Shoulder Instructions      Home Living Family/patient expects to be discharged to:: Private residence Living Arrangements: Alone Available Help at Discharge: Family;Available PRN/intermittently Type of Home: House Home Access: Stairs to enter Entrance Stairs-Number of Steps: 3 Entrance Stairs-Rails: None Home Layout: Two  level;Bed/bath upstairs Alternate Level Stairs-Number of Steps: flight   Bathroom Shower/Tub: Occupational psychologist: Standard     Home Equipment: Crutches          Prior Functioning/Environment Prior Level of Function : Independent/Modified Independent             Mobility Comments: indep, no AD ADLs Comments: does not drive 2/2 seizures, works as a Education officer, 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        OT Problem List: Decreased cognition      OT Treatment/Interventions:      OT Goals(Current goals can be found in the care plan section) Acute Rehab OT Goals Patient Stated Goal: home OT Goal Formulation: With patient Time For Goal Achievement: 11/08/22  OT Frequency:      Co-evaluation              AM-PAC OT "6 Clicks" Daily Activity     Outcome Measure Help from another person eating meals?: None Help from another person taking care of personal grooming?: None Help from another person toileting, which includes using toliet, bedpan, or urinal?: None Help from another person bathing (including washing, rinsing, drying)?: None Help from another person to put on and taking off regular upper body clothing?: None Help from another person to put on and taking off regular lower body clothing?: None 6 Click Score: 24   End of Session Nurse Communication: Mobility status  Activity Tolerance: Patient tolerated treatment well Patient left: in bed;with call bell/phone within reach  OT Visit Diagnosis: Muscle weakness (generalized) (M62.81)                Time: [id number] OT Time Calculation (min): 18 min Charges:  OT General Charges $OT Visit: 1 Visit OT Evaluation $OT Eval Low Complexity: [place], OTR/L Acute Rehabilitation Services Office [phone] Secure Chat Communication Preferred   Elliot Cousin 11/08/2022, 4:29 PM

## 2022-11-08 NOTE — Hospital Course (Addendum)
52 y.o.f w/ history of seizure disorder that she has 1-2 breakthrough seizures every month and follows at [facility] neurology office, alcohol abuse, hypertension, hyperlipidemia, cannabis abuse, anxiety, depression presented to the ED after 2 or 3 seizures at home.Reportedly binge drinking for several days and in this setting was not adherent with her lamotrigine In the ED-CT head no acute finding, labs with hyponatremia metabolic acidosis leukocytosis UA WBC normal, creatinine CT, alcohol less than 10 UDS positive for [facility], neurology was consulted and admitted for breakthrough seizure and alcohol overuse.  Neurology adjusted AEDs-added clonazepam continued on home Lamictal.  She had rhabdomyolysis monitored overnight on IV fluids and rhabdo improved.  Keppra was discontinued and she did have anger isseus which she attributed to  "keppra rage" improved with Ativan, no withdrawal signs no tremors. This am she ate well. Ambulated well. She feels comfortable for discharge home today

## 2022-11-08 NOTE — Plan of Care (Signed)

## 2022-11-08 NOTE — Progress Notes (Signed)
PROGRESS NOTE Meredith Park  [id number] DOB: Nov 25, 1970 DOA: 11/06/2022 PCP: Elisabeth Cara, PA-C  Brief Narrative/Hospital Course: 52 y.o.f w/ history of seizure disorder that she has 1-2 breakthrough seizures every month and follows at [facility] neurology office, alcohol abuse, hypertension, hyperlipidemia, cannabis abuse, anxiety, depression presented to the ED after 2 or 3 seizures at home.Reportedly binge drinking for several days and in this setting was not adherent with her lamotrigine  In the ED-CT head no acute finding, labs with hyponatremia metabolic acidosis leukocytosis UA WBC normal, creatinine CT, alcohol less than 10 UDS positive for [facility], neurology was consulted and admitted for breakthrough seizure and alcohol overuse.  Neurology adjusted AEDs.    Subjective: Seen and examined this morning resting comfortably, no complaints saturating well at 94% room air Labs reviewed sodium improving bicarb improving at 16 leukocytosis resolved, CK of 677 AST ALT down trended last CIWA score at 3 this morning No tremors Complains of sore muscle short-term memory loss forgetfulness  Assessment and Plan: Principal Problem:   Breakthrough seizure ([place]) Active Problems:   Alcohol abuse   QT prolongation   Leukocytosis   Hyponatremia   Rhabdomyolysis   High anion gap metabolic acidosis   Seizure disorder with breakthrough seizure: CT head no acute finding, suspect noncompliance alcohol use as etiology/trigger. She has had difficulty tolerating different AEDsin past-there is report of Keppra causing irritability, not able to tolerate Vimpat and QTc prolonged.  Started on clonazepam 0.5 every 8 hours as an adjunct AED, continue Lamictal.  Discussed with Dr. Cheral Marker from neurology this morning discontinued Keppra, continue current meds,seizure precaution, no driving until seizure-free for 6 months advise outpatient neurology follow-up.   Alcohol abuseUDS W/ THC: No signs of  withdrawal, has been alcohol free for 4 years until last week whe she heard of new of possible lay off from work. continue CIWA protocol Ativan -last score this morning was at 3 thiamine folate multivitamin, cessation encouraged to quit alcohol and all recreational drug.  Motivated to continue to quit has already spoken with the AA friends   Elevated CK/mild rhabdomyolysis: CK slightly up 677 encourage oral hydration-continue IV fluid hydration for next 24 send repeat CK in a.m.   Recent Labs  Lab 11/06/22 1931 11/07/22 0545  CKTOTAL 476* 677*    Transaminitis likely from rhabdomyolysis and alcohol use no abdominal discomfort or pain-repeat LFTs improving.   QT prolongation -resolved on EKG 3/5   Mild leukocytosis likely reactive.  #1-resolved on repeat CBC Mild hyponatremia resolved High anion gap metabolic acidosis bicarb improving but normal lactic acid likely from dehydration. Hypertension controlled on Coreg at lower dose Hyperlipidemia on statin Anxiety/depression-mood stable   Morbid Obesity:Patient's Body mass index is 42.7 kg/m. : Will benefit with PCP follow-up, weight loss  healthy lifestyle and outpatient sleep evaluation.   DVT prophylaxis: heparin injection 5,000 Units Start: 11/07/22 0900 SCDs Start: 11/07/22 0239 Code Status:   Code Status: Full Code Family Communication: plan of care discussed with patient at bedside. Patient status is: Inpatient because of seizure, rhabdomyolysis Level of care: Telemetry Medical   Dispo: The patient is from: home            Anticipated disposition: Home tomorrow if remains stable Objective: Vitals last 24 hrs: Vitals:   11/07/22 1638 11/07/22 2007 11/07/22 2354 11/08/22 0417  BP: 114/73 (!) 142/90 107/76 (!) 122/103  Pulse: 69 68 73 75  Resp: '20 19 16 20  '$ Temp: (!) 97.4 F (36.3 C) 97.7  F (36.5 C) 97.8 F (36.6 C) 97.6 F (36.4 C)  TempSrc: Oral Oral Oral Oral  SpO2:  97% 90% 94%  Weight:      Height:       Weight  change:   Physical Examination: General exam: alert awake, older than stated age HEENT:Oral mucosa moist, Ear/Nose WNL grossly Respiratory system: bilaterally clear BS, no use of accessory muscle Cardiovascular system: S1 & S2 +, No JVD. Gastrointestinal system: Abdomen soft,NT,ND, BS+ Nervous System:Alert, awake, moving extremities. Extremities: LE edema neg,distal peripheral pulses palpable.  Skin: No rashes,no icterus. MSK: Normal muscle bulk,tone, power  Medications reviewed:  Scheduled Meds:  carvedilol  6.25 mg Oral BID   clonazePAM  0.5 mg Oral [id number]   folic acid  1 mg Oral Daily   heparin injection (subcutaneous)  5,000 Units Subcutaneous Q8H   lamoTRIgine  250 mg Oral BID   levETIRAcetam  1,000 mg Oral BID   multivitamin with minerals  1 tablet Oral Daily   thiamine  100 mg Oral Daily   Or   thiamine  100 mg Intravenous Daily   Vitamin D (Ergocalciferol)  50,000 Units Oral Weekly   Continuous Infusions:  sodium chloride      Diet Order             Diet Heart Room service appropriate? Yes; Fluid consistency: Thin  Diet effective now                   Intake/Output Summary (Last 24 hours) at 11/08/2022 0919 Last data filed at 11/07/2022 1707 Gross per 24 hour  Intake 979.28 ml  Output --  Net 979.28 ml   Net IO Since Admission: 2,179.28 mL [11/08/22 0919]  Wt Readings from Last 3 Encounters:  11/06/22 105.9 kg  04/10/22 105.9 kg  02/08/22 108 kg     Unresulted Labs (From admission, onward)     Start     Ordered   11/09/22 0500  CK  Tomorrow morning,   R        11/08/22 0919   11/06/22 1929  Lamotrigine level  Once,   URGENT        11/06/22 1928          Data Reviewed: I have personally reviewed following labs and imaging studies CBC: Recent Labs  Lab 11/06/22 1841 11/07/22 0545  WBC 12.6* 9.0  HGB 14.7 12.5  HCT 42.9 36.6  MCV 81.4 84.1  PLT 323 0000000   Basic Metabolic Panel: Recent Labs  Lab 11/06/22 1841 11/06/22 1931 11/07/22 0545   NA 131*  --  132*  K 4.3  --  4.1  CL 97*  --  100  CO2 14*  --  16*  GLUCOSE 159*  --  95  BUN 17  --  14  CREATININE 1.03*  --  0.99  CALCIUM 9.0  --  8.5*  MG  --  2.4  --    GFR: Estimated Creatinine Clearance: 76.8 mL/min (by C-G formula based on SCr of 0.99 mg/dL). Liver Function Tests: Recent Labs  Lab 11/06/22 1841 11/07/22 0545  AST 57* 36  ALT 103* 78*  ALKPHOS 119 94  BILITOT 1.9* 1.7*  PROT 7.5 6.3*  ALBUMIN 4.8 4.1   Recent Labs  Lab 11/06/22 1841  LIPASE 26   No results for input(s): "AMMONIA" in the last 168 hours. Recent Labs  Lab 11/07/22 0545  LATICACIDVEN 1.1    No results found for this or any previous visit (  from the past 240 hour(s)).  Antimicrobials: Anti-infectives (From admission, onward)    None      Culture/Microbiology No results found for: "SDES", "SPECREQUEST", "CULT", "REPTSTATUS"  Radiology Studies: CT Head Wo Contrast  Result Date: 11/06/2022 CLINICAL DATA:  Seizure disorder EXAM: CT HEAD WITHOUT CONTRAST TECHNIQUE: Contiguous axial images were obtained from the base of the skull through the vertex without intravenous contrast. RADIATION DOSE REDUCTION: This exam was performed according to the departmental dose-optimization program which includes automated exposure control, adjustment of the mA and/or kV according to patient size and/or use of iterative reconstruction technique. COMPARISON:  12/11/2021 FINDINGS: Brain: No evidence of acute infarction, hemorrhage, hydrocephalus, extra-axial collection or mass lesion/mass effect. Vascular: No hyperdense vessel or unexpected calcification. Skull: Normal. Negative for fracture or focal lesion. Sinuses/Orbits: No acute finding. Other: None. IMPRESSION: No acute intracranial abnormality noted. Electronically Signed   By: Inez Catalina M.D.   On: 11/06/2022 22:25     LOS: 1 day   Antonieta Pert, MD [facility]  11/08/2022, 9:19 AM

## 2022-11-09 LAB — CK: Total CK: 338 U/L — ABNORMAL HIGH (ref 38–234)

## 2022-11-09 LAB — LAMOTRIGINE LEVEL: Lamotrigine Lvl: 8.1 ug/mL (ref 2.0–20.0)

## 2022-11-09 MED ORDER — VITAMIN B-1 100 MG PO TABS: 100.0000 mg | ORAL_TABLET | Freq: Every day | ORAL | 0 refills | Status: AC

## 2022-11-09 MED ORDER — FOLIC ACID 1 MG PO TABS: 1.0000 mg | ORAL_TABLET | Freq: Every day | ORAL | 0 refills | Status: AC

## 2022-11-09 MED ORDER — CLONAZEPAM 0.5 MG PO TABS: 0.5000 mg | ORAL_TABLET | Freq: Three times a day (TID) | ORAL | 0 refills | Status: DC

## 2022-11-09 NOTE — Discharge Instructions (Signed)
Per [place] DMV statutes, patients with seizures are not allowed to drive until  they have been seizure-free for six months. Use caution when using heavy equipment or power tools. Avoid working on ladders or at heights. Take showers instead of baths. Ensure the water temperature is not too high on the home water heater. Do not go swimming alone. When caring for infants or small children, sit down when holding, feeding, or changing them to minimize risk of injury to the child in the event you have a seizure.  Maintain good sleep hygiene. Avoid alcohol.  

## 2022-11-09 NOTE — Progress Notes (Signed)
Physical Therapy Evaluation Patient Details Name: Meredith Park MRN: [id number] DOB: 1971-07-29 Today's Date: 11/09/2022  History of Present Illness  Meredith Park is a 52 yo female who presented to the ED after 2 or 3 seizures at home after reportedly binge drinking for several days and in this setting was not adherent with her lamotrigine. PMHx: seizure disorder that she has 1-2 breakthrough seizures every month, alcohol abuse, hypertension, hyperlipidemia, cannabis abuse, anxiety, depression  Clinical Impression  Pt was seen for bedrest related gait changes, with minimal balance change that is not leading to an outright LOB.  Talked with her about getting therapy and mobility staff to increase walking distances as well as challenging very high level balance.  Given pt is from home to work and currently not driving, will not recommend further therapy as she should be able to increase activity well.  Follow acutely to encourage distances and highest balance.       Recommendations for follow up therapy are one component of a multi-disciplinary discharge planning process, led by the attending physician.  Recommendations may be updated based on patient status, additional functional criteria and insurance authorization.  Follow Up Recommendations No PT follow up      Assistance Recommended at Discharge PRN  Patient can return home with the following  Assist for transportation    Equipment Recommendations None recommended by PT  Recommendations for Other Services       Functional Status Assessment Patient has had a recent decline in their functional status and demonstrates the ability to make significant improvements in function in a reasonable and predictable amount of time.     Precautions / Restrictions Precautions Precautions: Fall Restrictions Weight Bearing Restrictions: No      Mobility  Bed Mobility Overal bed mobility: Modified Independent                   Transfers Overall transfer level: Modified independent                      Ambulation/Gait Ambulation/Gait assistance: Supervision (for safety) Gait Distance (Feet): 250 Feet Assistive device: None Gait Pattern/deviations: Step-through pattern, Wide base of support, Decreased stride length Gait velocity: reduced Gait velocity interpretation: <1.31 ft/sec, indicative of household ambulator Pre-gait activities: standing balance ck General Gait Details: pt compensates for minor lateral instability  Stairs Stairs: Yes Stairs assistance: Supervision, Min guard Stair Management: One rail Right, One rail Left, Alternating pattern, Forwards Number of Stairs: 18 (9 x 2) General stair comments: encouraged pt to take her time, with rails is safely able to balance  Wheelchair Mobility    Modified Rankin (Stroke Patients Only)       Balance Overall balance assessment: Needs assistance Sitting-balance support: Feet supported Sitting balance-Leahy Scale: Good     Standing balance support: No upper extremity supported Standing balance-Leahy Scale: Fair Standing balance comment: pt is noted to have minor balance changes but has compensatory strategies, and no outright LOB occurs                             Pertinent Vitals/Pain Pain Assessment Pain Assessment: No/denies pain    Home Living Family/patient expects to be discharged to:: Private residence Living Arrangements: Alone Available Help at Discharge: Family;Available PRN/intermittently Type of Home: House Home Access: Stairs to enter Entrance Stairs-Rails: None Entrance Stairs-Number of Steps: 3 Alternate Level Stairs-Number of Steps: flight but two sets Home Layout:  Two level;Bed/bath upstairs Home Equipment: Crutches Additional Comments: falls from seizures    Prior Function Prior Level of Function : Independent/Modified Independent             Mobility Comments: no AD ADLs Comments:  working from home with no license currently d/t seizures     Hand Dominance   Dominant Hand: Right    Extremity/Trunk Assessment   Upper Extremity Assessment Upper Extremity Assessment: Defer to OT evaluation    Lower Extremity Assessment Lower Extremity Assessment: Overall WFL for tasks assessed    Cervical / Trunk Assessment Cervical / Trunk Assessment: Normal  Communication   Communication: No difficulties  Cognition Arousal/Alertness: Awake/alert Behavior During Therapy: WFL for tasks assessed/performed Overall Cognitive Status: No family/caregiver present to determine baseline cognitive functioning Area of Impairment: Safety/judgement                         Safety/Judgement: Decreased awareness of safety     General Comments: able to walk with unsteady gait and is minimally unaware of the changes        General Comments General comments (skin integrity, edema, etc.): no changes of vitals to walk    Exercises     Assessment/Plan    PT Assessment Patient does not need any further PT services  PT Problem List         PT Treatment Interventions      PT Goals (Current goals can be found in the Care Plan section)  Acute Rehab PT Goals Patient Stated Goal: to make a decision about work being potentially cut PT Goal Formulation: With patient Time For Goal Achievement: 11/13/22 Potential to Achieve Goals: Good    Frequency       Co-evaluation               AM-PAC PT "6 Clicks" Mobility  Outcome Measure Help needed turning from your back to your side while in a flat bed without using bedrails?: None Help needed moving from lying on your back to sitting on the side of a flat bed without using bedrails?: None Help needed moving to and from a bed to a chair (including a wheelchair)?: None Help needed standing up from a chair using your arms (e.g., wheelchair or bedside chair)?: None Help needed to walk in hospital room?: A Little Help  needed climbing 3-5 steps with a railing? : A Little 6 Click Score: 22    End of Session Equipment Utilized During Treatment: Gait belt Activity Tolerance: Patient tolerated treatment well Patient left: in bed;with call bell/phone within reach Nurse Communication: Mobility status PT Visit Diagnosis: Other (comment) (falls with seizures)    Time: [id number] PT Time Calculation (min) (ACUTE ONLY): 29 min   Charges:   PT Evaluation $PT Eval Moderate Complexity: 1 Mod PT Treatments $Gait Training: 8-22 mins       Ramond Dial 11/09/2022, 12:19 PM  Mee Hives, PT PhD Acute Rehab Dept. Number: [place] and [place]

## 2022-11-09 NOTE — Progress Notes (Signed)
Patient agreed to cpap. Brought cpap to room, did not want to wear at this time.

## 2022-11-09 NOTE — Progress Notes (Signed)
[id number] Patient screaming and cursing in her room. Upon assessment patient was standing near her bed, very agitated, yelling and stated she's about to rip IV and all the wires off of her and start throwing things. Stated she has "keppra rage". Also noted pulling her hair. Ativan given and patient calmed down. Patient currently resting in bed with eyes closed. Will continue to monitor.

## 2022-11-09 NOTE — Plan of Care (Signed)
  Problem: Education: Goal: Knowledge of General Education information will improve Description: Including pain rating scale, medication(s)/side effects and non-pharmacologic comfort measures Outcome: Adequate for Discharge   Problem: Health Behavior/Discharge Planning: Goal: Ability to manage health-related needs will improve Outcome: Adequate for Discharge   Problem: Clinical Measurements: Goal: Ability to maintain clinical measurements within normal limits will improve Outcome: Adequate for Discharge Goal: Will remain free from infection Outcome: Adequate for Discharge Goal: Diagnostic test results will improve Outcome: Adequate for Discharge Goal: Respiratory complications will improve Outcome: Adequate for Discharge Goal: Cardiovascular complication will be avoided Outcome: Adequate for Discharge   Problem: Activity: Goal: Risk for activity intolerance will decrease Outcome: Adequate for Discharge   Problem: Nutrition: Goal: Adequate nutrition will be maintained Outcome: Adequate for Discharge   Problem: Coping: Goal: Level of anxiety will decrease Outcome: Adequate for Discharge   Problem: Elimination: Goal: Will not experience complications related to bowel motility Outcome: Adequate for Discharge Goal: Will not experience complications related to urinary retention Outcome: Adequate for Discharge   Problem: Pain Managment: Goal: General experience of comfort will improve Outcome: Adequate for Discharge   Problem: Safety: Goal: Ability to remain free from injury will improve Outcome: Adequate for Discharge   Problem: Skin Integrity: Goal: Risk for impaired skin integrity will decrease Outcome: Adequate for Discharge   Problem: Acute Rehab PT Goals(only PT should resolve) Goal: Pt Will Perform Standing Balance Or Pre-Gait Outcome: Adequate for Discharge Goal: Pt Will Ambulate Outcome: Adequate for Discharge Goal: Pt Will Go Up/Down Stairs Outcome:  Adequate for Discharge

## 2022-11-09 NOTE — Discharge Summary (Signed)
Physician Discharge Summary  Erie Coors [id number] DOB: 1971-07-22 DOA: 11/06/2022  PCP: Elisabeth Cara, PA-C  Admit date: 11/06/2022 Discharge date: 11/09/2022 Recommendations for Outpatient Follow-up:  Follow up with PCP in 1 weeks-call for appointment Please obtain BMP/CBC in one week  Discharge Dispo: home Discharge Condition: Stable Code Status:   Code Status: Full Code Diet recommendation:  Diet Order             Diet Heart Room service appropriate? Yes; Fluid consistency: Thin  Diet effective now                    Brief/Interim Summary: 52 y.o.f w/ history of seizure disorder that she has 1-2 breakthrough seizures every month and follows at [facility] neurology office, alcohol abuse, hypertension, hyperlipidemia, cannabis abuse, anxiety, depression presented to the ED after 2 or 3 seizures at home.Reportedly binge drinking for several days and in this setting was not adherent with her lamotrigine In the ED-CT head no acute finding, labs with hyponatremia metabolic acidosis leukocytosis UA WBC normal, creatinine CT, alcohol less than 10 UDS positive for [facility], neurology was consulted and admitted for breakthrough seizure and alcohol overuse.  Neurology adjusted AEDs-added clonazepam continued on home Lamictal.  She had rhabdomyolysis monitored overnight on IV fluids and rhabdo improved.  Keppra was discontinued and she did have anger isseus which she attributed to  "keppra rage" improved with Ativan, no withdrawal signs no tremors. This am she ate well. Ambulated well. She feels comfortable for discharge home today    Discharge Diagnoses:  Principal Problem:   Breakthrough seizure ([place]) Active Problems:   Alcohol abuse   QT prolongation   Leukocytosis   Hyponatremia   Rhabdomyolysis   High anion gap metabolic acidosis Seizure disorder with breakthrough seizure: CT head no acute finding, suspect noncompliance alcohol use as etiology/trigger. She has had  difficulty tolerating different AEDsin past-there is report of Keppra causing irritability, not able to tolerate Vimpat and QTc prolonged.  Started on clonazepam 0.5 every 8 hours as an adjunct AED, continue Lamictal.  Discussed with Dr. Cheral Marker neurology has signed.  Patient is stable for discharge on Lamictal, benzodiazepine clonazepam, especially discussed about risk of alcohol consumption in the setting of AED that may lead to respiratory depression and death. NO driving until seizure-free for 6 months advise outpatient neurology follow-up.  I discussed with Dr. Cheral Marker from neurology for prescription for antiseizure medication, he informed me he is unable to send an outpatient Rx from epic so  he advised me to send 30-day supplies which I did, patient has been informed again not to drink alcohol while she is taking clonazepam given risk of respiratory depression that could lead to death.   Alcohol abuseUDS W/ THC: No signs of withdrawal, has been alcohol free for 4 years until last week whe she heard of new of possible lay off from work.Motivated to continue to quit has already spoken with the AA friends   Elevated CK/mild rhabdomyolysis: Improving continue oral hydration  Recent Labs  Lab 11/06/22 1931 11/07/22 0545 11/09/22 0541  CKTOTAL 476* 677* 338*    Transaminitis likely from rhabdomyolysis and alcohol use no abdominal discomfort or pain-repeat LFTs improving.   QT prolongation -resolved on EKG 3/5   Mild leukocytosis likely reactive.  #1-resolved on repeat CBC Mild hyponatremia resolved High anion gap metabolic acidosis bicarb improving but normal lactic acid likely from dehydration. Hypertension controlled on Coreg at lower dose Hyperlipidemia on statin Anxiety/depression-mood stable  Morbid Obesity:Patient's Body mass index is 42.7 kg/m. : Will benefit with PCP follow-up, weight loss  healthy lifestyle and outpatient sleep  evaluation.   Consults: Neurology Subjective: Alert awake oriented resting comfortably no tremors or withdrawal.  Discharge Exam: Vitals:   11/09/22 0348 11/09/22 0939  BP: 131/82 (!) 132/104  Pulse: 69   Resp: 18 12  Temp: 98.1 F (36.7 C) 98.1 F (36.7 C)  SpO2: 95%    General: Pt is alert, awake, not in acute distress Cardiovascular: RRR, S1/S2 +, no rubs, no gallops Respiratory: CTA bilaterally, no wheezing, no rhonchi Abdominal: Soft, NT, ND, bowel sounds + Extremities: no edema, no cyanosis  Discharge Instructions  Discharge Instructions     Discharge instructions   Complete by: As directed    Please follow-up with neurology at [facility] within a week and with PCP as well  You have been prescribed benzodiazepine medication which should not be mixed with alcohol with any form or shape so PLEASE stay away from any kind of alcohol, taking alcohol and benzodiazepine/clonazepam together can cause respiratory depression and death.  Per [facility] statutes, patients with seizures are not allowed to drive until  they have been seizure-free for six months. Use caution when using heavy equipment or power tools. Avoid working on ladders or at heights. Take showers instead of baths. Ensure the water temperature is not too high on the home water heater. Do not go swimming alone. When caring for infants or small children, sit down when holding, feeding, or changing them to minimize risk of injury to the child in the event you have a seizure.  Maintain good sleep hygiene. Avoid alcohol.     Please call call MD or return to ER for similar or worsening recurring problem that brought you to hospital or if any fever,nausea/vomiting,abdominal pain, uncontrolled pain, chest pain,  shortness of breath or any other alarming symptoms.  Please follow-up your doctor as instructed in a week time and call the office for appointment.  Please avoid alcohol, smoking, or any other illicit  substance and maintain healthy habits including taking your regular medications as prescribed.  You were cared for by a hospitalist during your hospital stay. If you have any questions about your discharge medications or the care you received while you were in the hospital after you are discharged, you can call the unit and ask to speak with the hospitalist on call if the hospitalist that took care of you is not available.  Once you are discharged, your primary care physician will handle any further medical issues. Please note that NO REFILLS for any discharge medications will be authorized once you are discharged, as it is imperative that you return to your primary care physician (or establish a relationship with a primary care physician if you do not have one) for your aftercare needs so that they can reassess your need for medications and monitor your lab values   Increase activity slowly   Complete by: As directed    No driving for 6 months until seizure-free      Allergies as of 11/09/2022       Reactions   Losartan Potassium-hctz    "Out of mind"/high ammonia level   Meperidine Itching        Medication List     TAKE these medications    atorvastatin 10 MG tablet Commonly known as: LIPITOR Take 10 mg by mouth daily.   carvedilol 12.5 MG tablet Commonly known as: COREG  Take 1 tablet (12.5 mg total) by mouth 2 (two) times daily.   clonazePAM 0.5 MG tablet Commonly known as: KLONOPIN Take 1 tablet (0.5 mg total) by mouth every 8 (eight) hours.   folic acid 1 MG tablet Commonly known as: FOLVITE Take 1 tablet (1 mg total) by mouth daily. Start taking on: November 10, 2022   ibuprofen 200 MG tablet Commonly known as: ADVIL Take 600 mg by mouth every 6 (six) hours as needed for moderate pain.   lamoTRIgine 50 MG Tbdp Take 50 mg by mouth 2 (two) times daily.   lamoTRIgine 200 MG tablet Commonly known as: LAMICTAL Take 200 mg by mouth 2 (two) times daily.   losartan 50  MG tablet Commonly known as: COZAAR Take 50 mg by mouth daily. Take only if blood pressure is over 139.   Nayzilam 5 MG/0.1ML Soln Generic drug: Midazolam Place 5 mg into the nose as directed. As needed for seizures.   sertraline 50 MG tablet Commonly known as: ZOLOFT Take 50 mg by mouth daily.   SUMAtriptan 100 MG tablet Commonly known as: IMITREX Take 1 tablet (100 mg total) by mouth daily as needed for migraine.   thiamine 100 MG tablet Commonly known as: Vitamin B-1 Take 1 tablet (100 mg total) by mouth daily. Start taking on: November 10, 2022        Follow-up [place], [place] Follow up in 1 week(s).   Specialty: Family Medicine Contact information: [id number] [place] [address] [phone]         [facility] neurology office Follow up in 3 day(s).                 Allergies  Allergen Reactions   Losartan Potassium-Hctz     "Out of mind"/high ammonia level   Meperidine Itching    The results of significant diagnostics from this hospitalization (including imaging, microbiology, ancillary and laboratory) are listed below for reference.    Microbiology: No results found for this or any previous visit (from the past 240 hour(s)).  Procedures/Studies: CT Head Wo Contrast  Result Date: 11/06/2022 CLINICAL DATA:  Seizure disorder EXAM: CT HEAD WITHOUT CONTRAST TECHNIQUE: Contiguous axial images were obtained from the base of the skull through the vertex without intravenous contrast. RADIATION DOSE REDUCTION: This exam was performed according to the departmental dose-optimization program which includes automated exposure control, adjustment of the mA and/or kV according to patient size and/or use of iterative reconstruction technique. COMPARISON:  12/11/2021 FINDINGS: Brain: No evidence of acute infarction, hemorrhage, hydrocephalus, extra-axial collection or mass lesion/mass effect. Vascular: No hyperdense  vessel or unexpected calcification. Skull: Normal. Negative for fracture or focal lesion. Sinuses/Orbits: No acute finding. Other: None. IMPRESSION: No acute intracranial abnormality noted. Electronically Signed   By: Inez Catalina M.D.   On: 11/06/2022 22:25    Labs: BNP (last 3 results) No results for input(s): "BNP" in the last 8760 hours. Basic Metabolic Panel: Recent Labs  Lab 11/06/22 1841 11/06/22 1931 11/07/22 0545  NA 131*  --  132*  K 4.3  --  4.1  CL 97*  --  100  CO2 14*  --  16*  GLUCOSE 159*  --  95  BUN 17  --  14  CREATININE 1.03*  --  0.99  CALCIUM 9.0  --  8.5*  MG  --  2.4  --    Liver Function Tests: Recent Labs  Lab  11/06/22 1841 11/07/22 0545  AST 57* 36  ALT 103* 78*  ALKPHOS 119 94  BILITOT 1.9* 1.7*  PROT 7.5 6.3*  ALBUMIN 4.8 4.1   Recent Labs  Lab 11/06/22 1841  LIPASE 26   No results for input(s): "AMMONIA" in the last 168 hours. CBC: Recent Labs  Lab 11/06/22 1841 11/07/22 0545  WBC 12.6* 9.0  HGB 14.7 12.5  HCT 42.9 36.6  MCV 81.4 84.1  PLT 323 178   Cardiac Enzymes: Recent Labs  Lab 11/06/22 1931 11/07/22 0545 11/09/22 0541  CKTOTAL 476* 677* 338*   Anemia work up No results for input(s): "VITAMINB12", "FOLATE", "FERRITIN", "TIBC", "IRON", "RETICCTPCT" in the last 72 hours. Urinalysis    Component Value Date/Time   COLORURINE YELLOW 11/06/2022 1856   APPEARANCEUR CLEAR 11/06/2022 1856   LABSPEC >=1.030 11/06/2022 1856   PHURINE 5.5 11/06/2022 1856   GLUCOSEU NEGATIVE 11/06/2022 1856   HGBUR SMALL (A) 11/06/2022 1856   BILIRUBINUR NEGATIVE 11/06/2022 1856   KETONESUR 80 (A) 11/06/2022 1856   PROTEINUR 100 (A) 11/06/2022 1856   NITRITE NEGATIVE 11/06/2022 1856   LEUKOCYTESUR NEGATIVE 11/06/2022 1856   Sepsis Labs Recent Labs  Lab 11/06/22 1841 11/07/22 0545  WBC 12.6* 9.0   Microbiology No results found for this or any previous visit (from the past 240 hour(s)).   Time coordinating discharge: 25  minutes  SIGNED: Antonieta Pert, MD  [facility] 11/09/2022, 2:11 PM  If 7PM-7AM, please contact night-coverage [email or web address]

## 2022-11-09 NOTE — Progress Notes (Signed)
Explained discharge instructions to patient. Reviewed follow up appointment and next medication administration times. Also reviewed education. Patient verbalized having an understanding for instructions given. All belongings are in the patient's possession to include TOC meds. IV and telemetry were removed. CCMD was notified. No other needs verbalized. Transported downstairs for discharge.

## 2022-11-09 NOTE — Plan of Care (Signed)

## 2022-11-09 NOTE — Progress Notes (Signed)
  Transition of Care [facility]) Screening Note   Patient Details  Name: Meredith Park Date of Birth: 13-Feb-1971   Transition of Care [facility]) CM/SW Contact:    Benard Halsted, LCSW Phone Number: 11/09/2022, 8:58 AM    CSW received ETOH consult; noted patient with agitation this morning. Will meet with patient once more appropriate and able to participate in assessment.

## 2022-11-22 ENCOUNTER — Emergency Department (HOSPITAL_BASED_OUTPATIENT_CLINIC_OR_DEPARTMENT_OTHER)
Admission: EM | Admit: 2022-11-22 | Discharge: 2022-11-22 | Disposition: A | Discharge status: Home / Self Care | Payer: 59 | Attending: Emergency Medicine | Admitting: Emergency Medicine

## 2022-11-22 ENCOUNTER — Encounter (HOSPITAL_BASED_OUTPATIENT_CLINIC_OR_DEPARTMENT_OTHER): Payer: Self-pay | Admitting: Urology

## 2022-11-22 DIAGNOSIS — R780 Finding of alcohol in blood: Secondary | ICD-10-CM

## 2022-11-22 LAB — CBC WITH DIFFERENTIAL/PLATELET
Abs Immature Granulocytes: 0.02 10*3/uL (ref 0.00–0.07)
Basophils Absolute: 0 10*3/uL (ref 0.0–0.1)
Basophils Relative: 0 %
Eosinophils Absolute: 0.1 10*3/uL (ref 0.0–0.5)
Eosinophils Relative: 1 %
HCT: 39.1 % (ref 36.0–46.0)
Hemoglobin: 13.2 g/dL (ref 12.0–15.0)
Immature Granulocytes: 0 %
Lymphocytes Relative: 18 %
Lymphs Abs: 1.4 10*3/uL (ref 0.7–4.0)
MCH: 28 pg (ref 26.0–34.0)
MCHC: 33.8 g/dL (ref 30.0–36.0)
MCV: 82.8 fL (ref 80.0–100.0)
Monocytes Absolute: 0.5 10*3/uL (ref 0.1–1.0)
Monocytes Relative: 6 %
Neutro Abs: 5.9 10*3/uL (ref 1.7–7.7)
Neutrophils Relative %: 75 %
Platelets: 290 10*3/uL (ref 150–400)
RBC: 4.72 MIL/uL (ref 3.87–5.11)
RDW: 13.6 % (ref 11.5–15.5)
WBC: 7.9 10*3/uL (ref 4.0–10.5)
nRBC: 0 % (ref 0.0–0.2)

## 2022-11-22 LAB — COMPREHENSIVE METABOLIC PANEL
ALT: 49 U/L — ABNORMAL HIGH (ref 0–44)
AST: 43 U/L — ABNORMAL HIGH (ref 15–41)
Albumin: 4.3 g/dL (ref 3.5–5.0)
Alkaline Phosphatase: 115 U/L (ref 38–126)
Anion gap: 15 (ref 5–15)
BUN: 13 mg/dL (ref 6–20)
CO2: 19 mmol/L — ABNORMAL LOW (ref 22–32)
Calcium: 8.5 mg/dL — ABNORMAL LOW (ref 8.9–10.3)
Chloride: 99 mmol/L (ref 98–111)
Creatinine, Ser: 0.76 mg/dL (ref 0.44–1.00)
GFR, Estimated: >60 mL/min (ref >60)
Glucose, Bld: 97 mg/dL (ref 70–99)
Potassium: 3.4 mmol/L — ABNORMAL LOW (ref 3.5–5.1)
Sodium: 133 mmol/L — ABNORMAL LOW (ref 135–145)
Total Bilirubin: 1 mg/dL (ref 0.3–1.2)
Total Protein: 6.8 g/dL (ref 6.5–8.1)

## 2022-11-22 LAB — ETHANOL: Alcohol, Ethyl (B): 101 mg/dL — ABNORMAL HIGH (ref <10)

## 2022-11-22 NOTE — ED Provider Notes (Signed)
[place] [facility] [place] Provider Note   CSN: [id number] Arrival date & time: 11/22/22  1150     History  Chief Complaint  Patient presents with   Alcohol Problem    Meredith Park is a 52 y.o. female.  Patient here with alcohol intoxication.  Brought in by a friend to make sure she is doing well.  She has been sober for 4 years but last 2 weeks she has been drinking on and off.  She denies any nausea vomit abdominal pain.  Nothing makes it worse or better.  She is doing well mentally.  The history is provided by the patient.       Home Medications Prior to Admission medications   Medication Sig Start Date End Date Taking? Authorizing Provider  atorvastatin (LIPITOR) 10 MG tablet Take 10 mg by mouth daily. 07/20/22   [provider]  carvedilol (COREG) 12.5 MG tablet Take 1 tablet (12.5 mg total) by mouth 2 (two) times daily. 08/24/17   Domenic Moras, PA-C  clonazePAM (KLONOPIN) 0.5 MG tablet Take 1 tablet (0.5 mg total) by mouth every 8 (eight) hours. 11/09/22 12/09/22  Antonieta Pert, MD  folic acid (FOLVITE) 1 MG tablet Take 1 tablet (1 mg total) by mouth daily. 11/10/22 12/10/22  Antonieta Pert, MD  ibuprofen (ADVIL) 200 MG tablet Take 600 mg by mouth every 6 (six) hours as needed for moderate pain.    [provider]  lamoTRIgine (LAMICTAL) 200 MG tablet Take 200 mg by mouth 2 (two) times daily. 10/14/22   [provider]  lamoTRIgine 50 MG TBDP Take 50 mg by mouth 2 (two) times daily. 09/15/22   [provider]  losartan (COZAAR) 50 MG tablet Take 50 mg by mouth daily. Take only if blood pressure is over 139. 07/12/21   [provider]  Midazolam (NAYZILAM) 5 MG/0.1ML SOLN Place 5 mg into the nose as directed. As needed for seizures.    [provider]  sertraline (ZOLOFT) 50 MG tablet Take 50 mg by mouth daily. Patient not taking: Reported on 11/07/2022 10/16/22   [provider]  SUMAtriptan  (IMITREX) 100 MG tablet Take 1 tablet (100 mg total) by mouth daily as needed for migraine. 12/11/21   Gareth Morgan, MD  thiamine (VITAMIN B-1) 100 MG tablet Take 1 tablet (100 mg total) by mouth daily. 11/10/22 12/10/22  Antonieta Pert, MD      Allergies    Losartan potassium-hctz and Meperidine    Review of Systems   Review of Systems  Physical Exam Updated Vital Signs BP (!) 172/116 (BP Location: Left Arm)   Pulse (!) 101   Temp 98.9 F (37.2 C) (Oral)   Resp 18   Ht 5\' 2"  (1.575 m)   Wt 105.9 kg   LMP 08/28/2020   SpO2 98%   BMI 42.70 kg/m  Physical Exam Vitals and nursing note reviewed.  Constitutional:      General: She is not in acute distress.    Appearance: She is well-developed.  HENT:     Head: Normocephalic and atraumatic.  Eyes:     Conjunctiva/sclera: Conjunctivae normal.  Cardiovascular:     Rate and Rhythm: Normal rate and regular rhythm.     Pulses: Normal pulses.     Heart sounds: No murmur heard. Pulmonary:     Effort: Pulmonary effort is normal. No respiratory distress.     Breath sounds: Normal breath sounds.  Abdominal:  Palpations: Abdomen is soft.     Tenderness: There is no abdominal tenderness.  Musculoskeletal:        General: No swelling.     Cervical back: Neck supple.  Skin:    General: Skin is warm and dry.     Capillary Refill: Capillary refill takes less than 2 seconds.  Neurological:     Mental Status: She is alert.  Psychiatric:        Mood and Affect: Mood normal.     ED Results / Procedures / Treatments   Labs (all labs ordered are listed, but only abnormal results are displayed) Labs Reviewed  ETHANOL - Abnormal; Notable for the following components:      Result Value   Alcohol, Ethyl (B) 101 (*)    All other components within normal limits  COMPREHENSIVE METABOLIC PANEL - Abnormal; Notable for the following components:   Sodium 133 (*)    Potassium 3.4 (*)    CO2 19 (*)    Calcium 8.5 (*)    AST 43 (*)    ALT 49  (*)    All other components within normal limits  CBC WITH DIFFERENTIAL/PLATELET    EKG None  Radiology No results found.  Procedures Procedures    Medications Ordered in ED Medications - No data to display  ED Course/ Medical Decision Making/ A&P                             Medical Decision Making Amount and/or Complexity of Data Reviewed Labs: ordered.   Roda Trentman is here with alcohol intoxication.  Clinically she appears sober.  Unremarkable vitals.  No fever.  Screening labs are unremarkable.  Alcohol level 100.  She has been sober for 4 years.  Now been drinking again the last 2 weeks.  She is not showing any signs of withdrawal symptoms.  She has resources to help with her sobriety.  Mentally she is in a good space.  She understands return precautions.  Discharged in good condition.  This chart was dictated using voice recognition software.  Despite best efforts to proofread,  errors can occur which can change the documentation meaning.         Final Clinical Impression(s) / ED Diagnoses Final diagnoses:  Elevated blood alcohol level, blood alcohol level not specified    Rx / DC Orders ED Discharge Orders     None         Lennice Sites, DO 11/22/22 1325

## 2022-11-22 NOTE — Discharge Instructions (Signed)
Please use your resources that you have used in the past to help with your sobriety.  Your blood work today is normal.  Please reach out to rehab facilities if needed.  Please attend an [facility] meeting.

## 2022-11-22 NOTE — ED Triage Notes (Signed)
Pt states been drinking since Sunday, states h/o alcoholism  States was sober for 4 years prior to consumption Pt states " I just want to make sure I'm not dying" pt states being tired Last etoh was this morning  State "just want my etoh level checked"  H/o epilepsy, took meds as prescribed

## 2022-11-23 ENCOUNTER — Encounter (HOSPITAL_BASED_OUTPATIENT_CLINIC_OR_DEPARTMENT_OTHER): Payer: Self-pay | Admitting: Pediatrics

## 2022-11-23 ENCOUNTER — Other Ambulatory Visit: Payer: Self-pay

## 2022-11-23 ENCOUNTER — Emergency Department (HOSPITAL_BASED_OUTPATIENT_CLINIC_OR_DEPARTMENT_OTHER)
Admission: EM | Admit: 2022-11-23 | Discharge: 2022-11-23 | Disposition: A | Discharge status: Home / Self Care | Payer: 59 | Attending: Emergency Medicine | Admitting: Emergency Medicine

## 2022-11-23 DIAGNOSIS — R748 Abnormal levels of other serum enzymes: Secondary | ICD-10-CM | POA: Insufficient documentation

## 2022-11-23 DIAGNOSIS — G40909 Epilepsy, unspecified, not intractable, without status epilepticus: Secondary | ICD-10-CM | POA: Insufficient documentation

## 2022-11-23 DIAGNOSIS — Z79899 Other long term (current) drug therapy: Secondary | ICD-10-CM | POA: Diagnosis not present

## 2022-11-23 DIAGNOSIS — Z0000 Encounter for general adult medical examination without abnormal findings: Secondary | ICD-10-CM

## 2022-11-23 LAB — CBC WITH DIFFERENTIAL/PLATELET
Abs Immature Granulocytes: 0.02 10*3/uL (ref 0.00–0.07)
Basophils Absolute: 0 10*3/uL (ref 0.0–0.1)
Basophils Relative: 0 %
Eosinophils Absolute: 0 10*3/uL (ref 0.0–0.5)
Eosinophils Relative: 0 %
HCT: 40 % (ref 36.0–46.0)
Hemoglobin: 13.8 g/dL (ref 12.0–15.0)
Immature Granulocytes: 0 %
Lymphocytes Relative: 9 %
Lymphs Abs: 0.6 10*3/uL — ABNORMAL LOW (ref 0.7–4.0)
MCH: 28.1 pg (ref 26.0–34.0)
MCHC: 34.5 g/dL (ref 30.0–36.0)
MCV: 81.5 fL (ref 80.0–100.0)
Monocytes Absolute: 0.3 10*3/uL (ref 0.1–1.0)
Monocytes Relative: 4 %
Neutro Abs: 5.6 10*3/uL (ref 1.7–7.7)
Neutrophils Relative %: 87 %
Platelets: 232 10*3/uL (ref 150–400)
RBC: 4.91 MIL/uL (ref 3.87–5.11)
RDW: 13.3 % (ref 11.5–15.5)
WBC: 6.5 10*3/uL (ref 4.0–10.5)
nRBC: 0 % (ref 0.0–0.2)

## 2022-11-23 LAB — CK: Total CK: 335 U/L — ABNORMAL HIGH (ref 38–234)

## 2022-11-23 LAB — COMPREHENSIVE METABOLIC PANEL
ALT: 40 U/L (ref 0–44)
AST: 35 U/L (ref 15–41)
Albumin: 5 g/dL (ref 3.5–5.0)
Alkaline Phosphatase: 118 U/L (ref 38–126)
Anion gap: 14 (ref 5–15)
BUN: 11 mg/dL (ref 6–20)
CO2: 23 mmol/L (ref 22–32)
Calcium: 9.7 mg/dL (ref 8.9–10.3)
Chloride: 96 mmol/L — ABNORMAL LOW (ref 98–111)
Creatinine, Ser: 0.84 mg/dL (ref 0.44–1.00)
GFR, Estimated: >60 mL/min (ref >60)
Glucose, Bld: 143 mg/dL — ABNORMAL HIGH (ref 70–99)
Potassium: 3.5 mmol/L (ref 3.5–5.1)
Sodium: 133 mmol/L — ABNORMAL LOW (ref 135–145)
Total Bilirubin: 1.1 mg/dL (ref 0.3–1.2)
Total Protein: 7.3 g/dL (ref 6.5–8.1)

## 2022-11-23 LAB — ETHANOL: Alcohol, Ethyl (B): <10 mg/dL (ref <10)

## 2022-11-23 MED ORDER — SODIUM CHLORIDE 0.9 % IV BOLUS
1000.0000 mL | Freq: Once | INTRAVENOUS | Status: AC
  Administered 2022-11-23: 1000 mL via INTRAVENOUS

## 2022-11-23 NOTE — ED Provider Notes (Addendum)
[place] EM[facility]GENCY [facility]MENT AT [place] Provider Note   CSN: [id number] Arrival date & time: 11/23/22  1149     History  Chief Complaint  Patient presents with   Seizures    Meredith Park is a 52 y.o. female.  HPI   52 year old female with past medical history of seizures, alcohol abuse presents the emergency department with concern for "previously elevated CK".  Patient was admitted earlier this month with breakthrough seizures.  Her medications were adjusted and she was discharged home.  She relapsed with alcohol use and was evaluated the other day.  Her workup was reassuring.  She returns today with concern that they never repeated her CK to prove that it was downtrending.  She denies any other acute changes in her health.  States that her last drink of alcohol was Monday.  No acute withdrawal symptoms.  States that she is been compliant with her Lamictal and no missed doses.  Home Medications Prior to Admission medications   Medication Sig Start Date End Date Taking? Authorizing Provider  atorvastatin (LIPITOR) 10 MG tablet Take 10 mg by mouth daily. 07/20/22   [provider]  carvedilol (COREG) 12.5 MG tablet Take 1 tablet (12.5 mg total) by mouth 2 (two) times daily. 08/24/17   Domenic Moras, PA-C  clonazePAM (KLONOPIN) 0.5 MG tablet Take 1 tablet (0.5 mg total) by mouth every 8 (eight) hours. 11/09/22 12/09/22  Antonieta Pert, MD  folic acid (FOLVITE) 1 MG tablet Take 1 tablet (1 mg total) by mouth daily. 11/10/22 12/10/22  Antonieta Pert, MD  ibuprofen (ADVIL) 200 MG tablet Take 600 mg by mouth every 6 (six) hours as needed for moderate pain.    [provider]  lamoTRIgine (LAMICTAL) 200 MG tablet Take 200 mg by mouth 2 (two) times daily. 10/14/22   [provider]  lamoTRIgine 50 MG TBDP Take 50 mg by mouth 2 (two) times daily. 09/15/22   [provider]  losartan (COZAAR) 50 MG tablet Take 50 mg by mouth daily. Take only if blood  pressure is over 139. 07/12/21   [provider]  Midazolam (NAYZILAM) 5 MG/0.1ML SOLN Place 5 mg into the nose as directed. As needed for seizures.    [provider]  sertraline (ZOLOFT) 50 MG tablet Take 50 mg by mouth daily. Patient not taking: Reported on 11/07/2022 10/16/22   [provider]  SUMAtriptan (IMITREX) 100 MG tablet Take 1 tablet (100 mg total) by mouth daily as needed for migraine. 12/11/21   Gareth Morgan, MD  thiamine (VITAMIN B-1) 100 MG tablet Take 1 tablet (100 mg total) by mouth daily. 11/10/22 12/10/22  Antonieta Pert, MD      Allergies    Meperidine, Hydrochlorothiazide, and Keppra [levetiracetam]    Review of Systems   Review of Systems  Constitutional:  Negative for fever.  Respiratory:  Negative for shortness of breath.   Cardiovascular:  Negative for chest pain.  Gastrointestinal:  Negative for abdominal pain.  Musculoskeletal:  Negative for back pain.    Physical Exam Updated Vital Signs BP (!) 153/110 (BP Location: Right Arm)   Pulse 72   Temp 98.4 F (36.9 C) (Oral)   Resp 17   Ht 5\' 2"  (1.575 m)   Wt 105.9 kg   LMP 08/28/2020   SpO2 99%   BMI 42.70 kg/m  Physical Exam Vitals and nursing note reviewed.  Constitutional:      Appearance: Normal appearance.  HENT:  Head: Normocephalic.     Mouth/Throat:     Mouth: Mucous membranes are moist.  Cardiovascular:     Rate and Rhythm: Normal rate.  Pulmonary:     Effort: Pulmonary effort is normal. No respiratory distress.  Abdominal:     Palpations: Abdomen is soft.     Tenderness: There is no abdominal tenderness.  Skin:    General: Skin is warm.  Neurological:     Mental Status: She is alert and oriented to person, place, and time. Mental status is at baseline.  Psychiatric:        Mood and Affect: Mood normal.     ED Results / Procedures / Treatments   Labs (all labs ordered are listed, but only abnormal results are displayed) Labs Reviewed  CBC WITH  DIFFERENTIAL/PLATELET - Abnormal; Notable for the following components:      Result Value   Lymphs Abs 0.6 (*)    All other components within normal limits  COMPREHENSIVE METABOLIC PANEL - Abnormal; Notable for the following components:   Sodium 133 (*)    Chloride 96 (*)    Glucose, Bld 143 (*)    All other components within normal limits  CK - Abnormal; Notable for the following components:   Total CK 335 (*)    All other components within normal limits  ETHANOL    EKG None  Radiology No results found.  Procedures Procedures    Medications Ordered in ED Medications  sodium chloride 0.9 % bolus 1,000 mL (1,000 mLs Intravenous New Bag/Given 11/23/22 1436)    ED Course/ Medical Decision Making/ A&P                             Medical Decision Making Amount and/or Complexity of Data Reviewed Labs: ordered.   52 year old female presents emergency department with concern for having repeat CK done.  She recently had an admission for breakthrough seizures where her CK was elevated.  Vital signs are stable on my evaluation.  She is neuro intact.  No acute complaints.  Blood work is reassuring and baseline, CK continues to be downtrending.  She was given IV hydration while she was here in the department.  Patient instructed for outpatient follow-up.  No seizure-like activity.  Patient at this time appears safe and stable for discharge and close outpatient follow up. Discharge plan and strict return to ED precautions discussed, patient verbalizes understanding and agreement.        Final Clinical Impression(s) / ED Diagnoses Final diagnoses:  None    Rx / DC Orders ED Discharge Orders     None         Lorelle Gibbs, DO 11/23/22 1524    Tanaya Dunigan, Alvin Critchley, DO 11/23/22 1525

## 2022-11-23 NOTE — ED Triage Notes (Signed)
Reports concern for seizure episodes, last one was yesterday and last alcohol intake was Monday. Stated she is concern for CK level as well as last time it was elevated.

## 2022-11-23 NOTE — ED Notes (Signed)
Discharge instructions reviewed with patient. Patient verbalizes understanding, no further questions at this time. Medications and follow up information provided. No acute distress noted at time of departure.  

## 2022-11-23 NOTE — Discharge Instructions (Signed)
You have been seen and discharged from the emergency department.  Your CK is appropriately downtrending, you are given IV fluids here in the department.  Stable hydrated, stay compliant with her seizure medication.  Follow-up with your primary provider for further evaluation and further care. Take home medications as prescribed. If you have any worsening symptoms or further concerns for your health please return to an emergency department for further evaluation.

## 2022-11-26 ENCOUNTER — Other Ambulatory Visit: Payer: Self-pay | Admitting: Neurology

## 2023-08-07 IMAGING — CT CT HEAD W/O CM
3 series · 15 of 47 positions shown, 18 images · non-contrast
Comparison: CT brain 08/05/2021

CLINICAL DATA: Altered mental status, nontraumatic. Headache.
Patient feels "out of it."



[Series 2: head wo · axial · 0.40mm/px · z∈[-172,-37]mm · 9 of 33 slices shown, 12 images]
[im 3/33  brain]
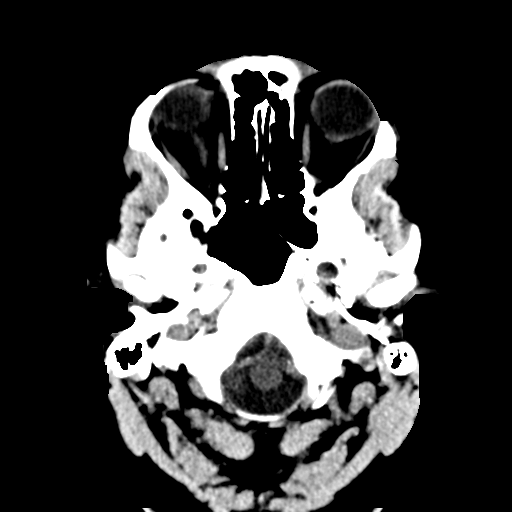
[im 3/33  bone]
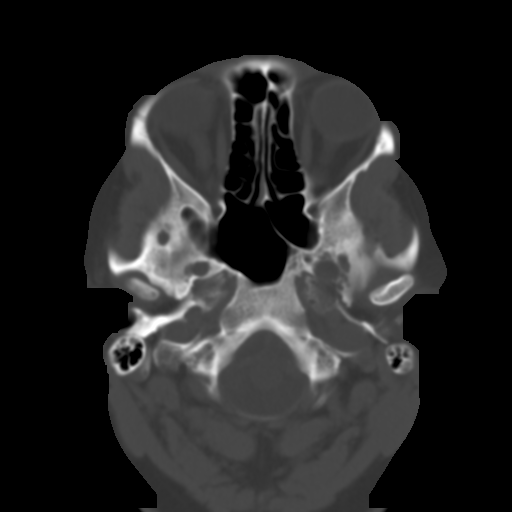
[im 6/33  brain]
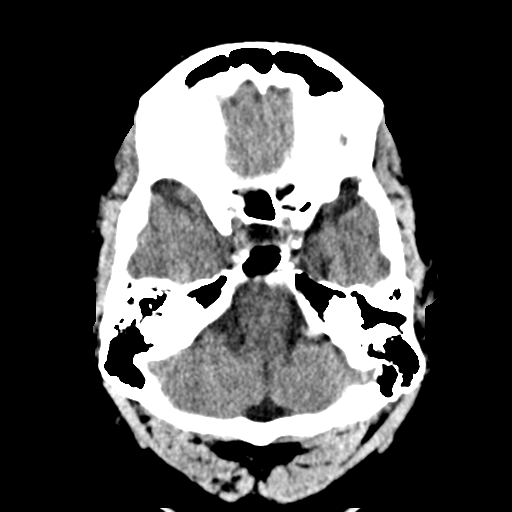
[im 9/33  brain]
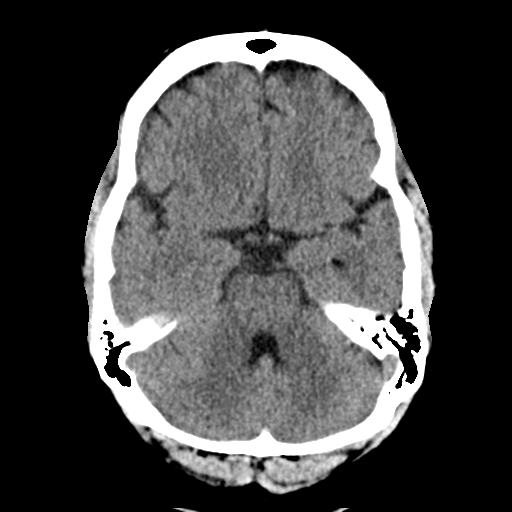
[im 13/33  brain]
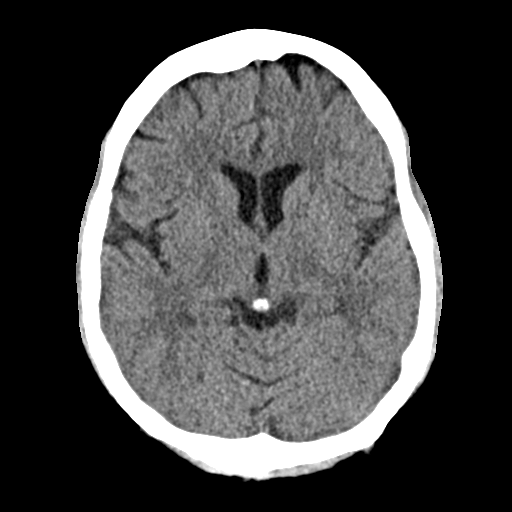
[im 17/33  brain]
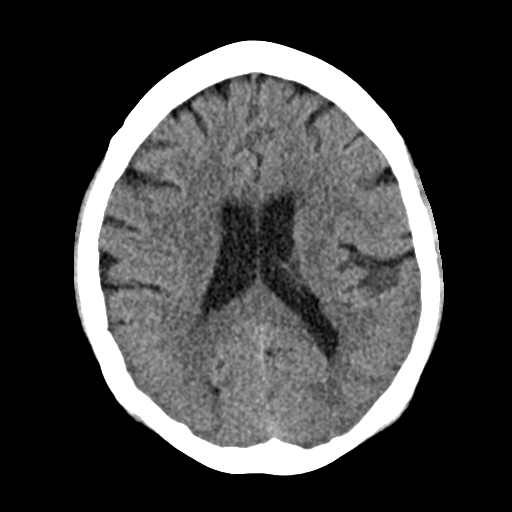
[im 17/33  bone]
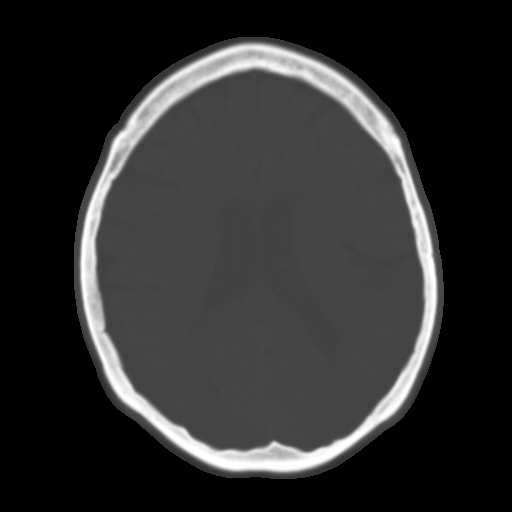
[im 20/33  brain]
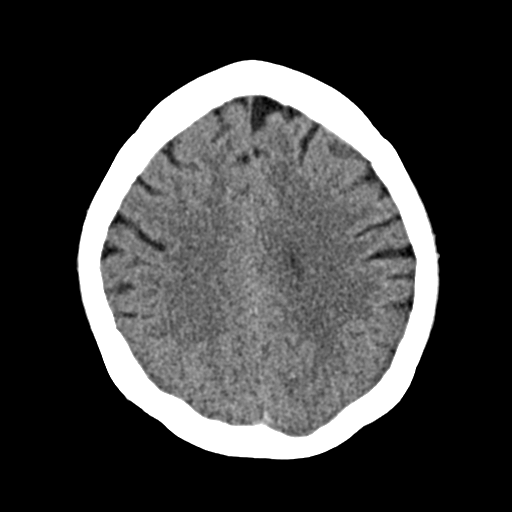
[im 24/33  brain]
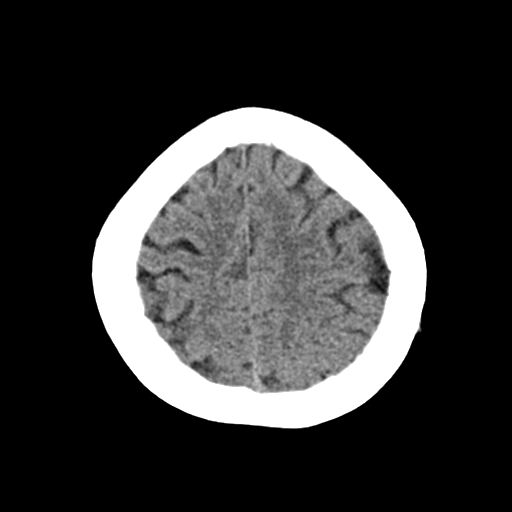
[im 27/33  brain]
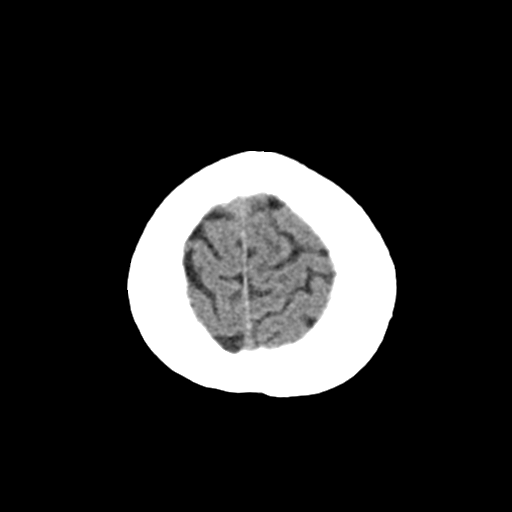
[im 30/33  brain]
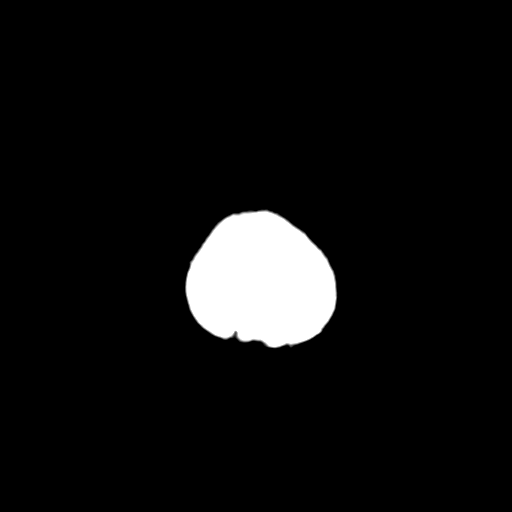
[im 30/33  bone]
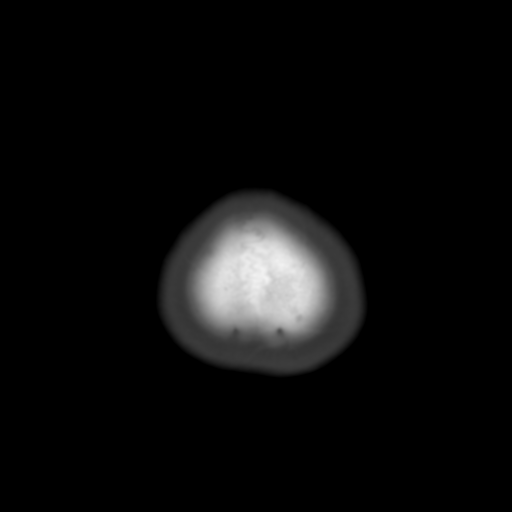

[Series 4: coronal soft · coronal · 0.33mm/px · 3 of 68 slices shown]
[im 23/68  brain]
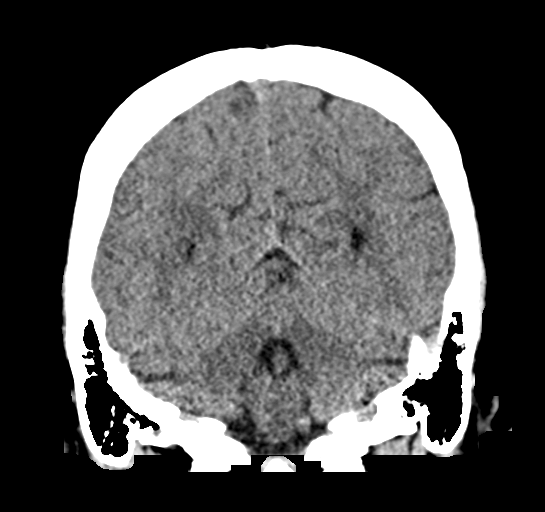
[im 30/68  brain]
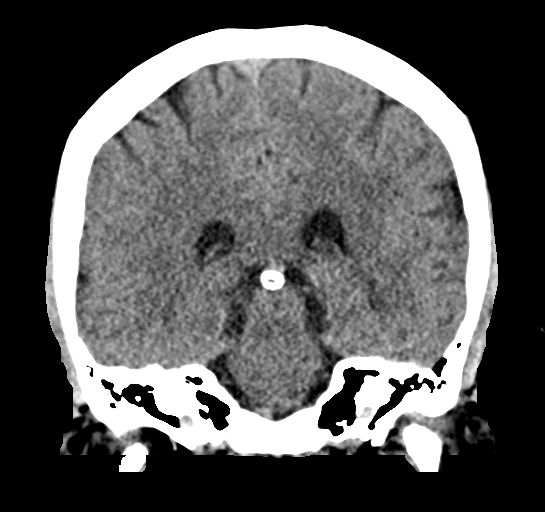
[im 38/68  brain]
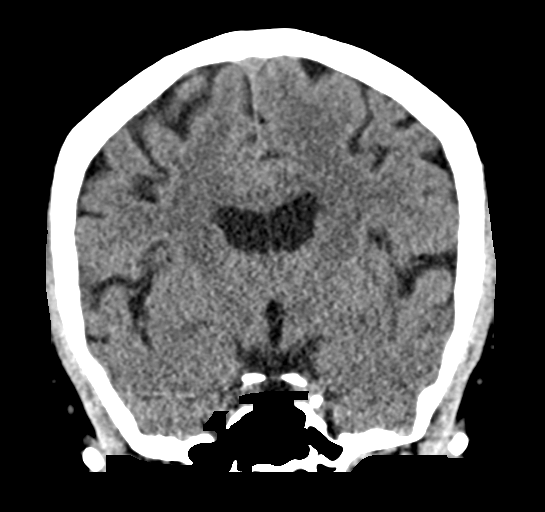

[Series 5: sag soft · sagittal · 0.32mm/px · 3 of 55 slices shown]
[im 19/55  brain]
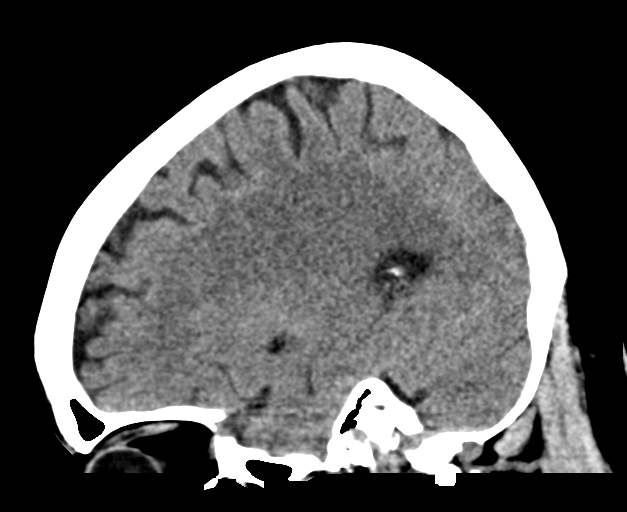
[im 28/55  brain]
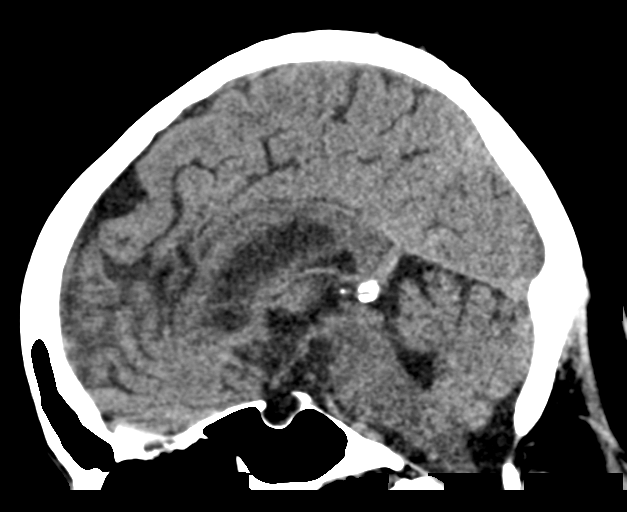
[im 37/55  brain]
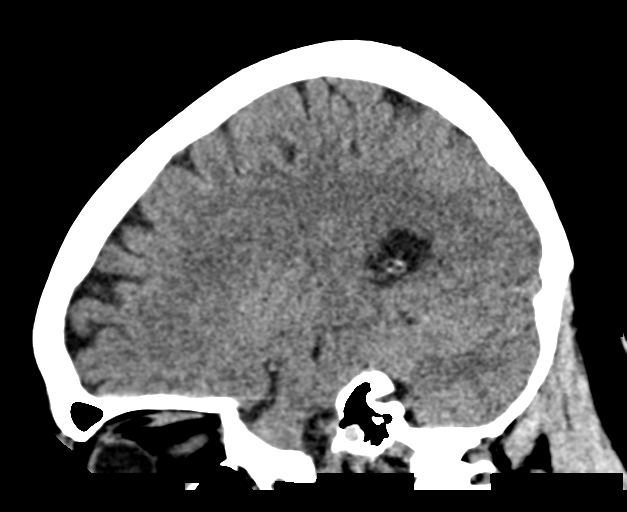

[15 of 47 positions shown; findings below may reference images not displayed]

FINDINGS: Brain: The ventricles are normal in size and configuration. The
basilar cisterns are patent.

No mass, mass effect, or midline shift.

No acute intracranial hemorrhage is seen.

No abnormal extra-axial fluid collection.

Preservation of the normal cortical gray-white interface without CT
evidence of an acute major vascular territorial cortical based
infarction.

Vascular: No hyperdense vessel or unexpected calcification.

Skull: Normal. Negative for fracture or focal lesion.

Sinuses/Orbits: The visualized orbits are unremarkable. The
visualized paranasal sinuses and mastoid air cells are clear.

Other: None.

## 2023-11-27 ENCOUNTER — Emergency Department (HOSPITAL_COMMUNITY)

## 2023-11-27 ENCOUNTER — Inpatient Hospital Stay (HOSPITAL_COMMUNITY)
Admission: EM | Admit: 2023-11-27 | Discharge: 2024-01-02 | DRG: 870 | Disposition: A | Discharge status: Skilled Nursing Facility | Attending: Internal Medicine | Admitting: Internal Medicine

## 2023-11-27 DIAGNOSIS — I63542 Cerebral infarction due to unspecified occlusion or stenosis of left cerebellar artery: Secondary | ICD-10-CM | POA: Diagnosis not present

## 2023-11-27 DIAGNOSIS — Z6824 Body mass index (BMI) 24.0-24.9, adult: Secondary | ICD-10-CM

## 2023-11-27 DIAGNOSIS — A419 Sepsis, unspecified organism: Secondary | ICD-10-CM | POA: Diagnosis present

## 2023-11-27 DIAGNOSIS — F17290 Nicotine dependence, other tobacco product, uncomplicated: Secondary | ICD-10-CM | POA: Diagnosis present

## 2023-11-27 DIAGNOSIS — G8194 Hemiplegia, unspecified affecting left nondominant side: Secondary | ICD-10-CM | POA: Diagnosis not present

## 2023-11-27 DIAGNOSIS — G9349 Other encephalopathy: Secondary | ICD-10-CM | POA: Diagnosis present

## 2023-11-27 DIAGNOSIS — Z7189 Other specified counseling: Secondary | ICD-10-CM | POA: Diagnosis not present

## 2023-11-27 DIAGNOSIS — I700 Atherosclerosis of aorta: Secondary | ICD-10-CM | POA: Diagnosis present

## 2023-11-27 DIAGNOSIS — F209 Schizophrenia, unspecified: Secondary | ICD-10-CM | POA: Diagnosis present

## 2023-11-27 DIAGNOSIS — D689 Coagulation defect, unspecified: Secondary | ICD-10-CM | POA: Diagnosis present

## 2023-11-27 DIAGNOSIS — Z885 Allergy status to narcotic agent status: Secondary | ICD-10-CM

## 2023-11-27 DIAGNOSIS — I3139 Other pericardial effusion (noninflammatory): Secondary | ICD-10-CM | POA: Diagnosis present

## 2023-11-27 DIAGNOSIS — E870 Hyperosmolality and hypernatremia: Secondary | ICD-10-CM | POA: Diagnosis not present

## 2023-11-27 DIAGNOSIS — G40901 Epilepsy, unspecified, not intractable, with status epilepticus: Secondary | ICD-10-CM | POA: Diagnosis present

## 2023-11-27 DIAGNOSIS — J9600 Acute respiratory failure, unspecified whether with hypoxia or hypercapnia: Secondary | ICD-10-CM | POA: Diagnosis not present

## 2023-11-27 DIAGNOSIS — I639 Cerebral infarction, unspecified: Secondary | ICD-10-CM | POA: Diagnosis not present

## 2023-11-27 DIAGNOSIS — R1310 Dysphagia, unspecified: Secondary | ICD-10-CM | POA: Diagnosis not present

## 2023-11-27 DIAGNOSIS — D696 Thrombocytopenia, unspecified: Secondary | ICD-10-CM | POA: Diagnosis present

## 2023-11-27 DIAGNOSIS — I10 Essential (primary) hypertension: Secondary | ICD-10-CM | POA: Diagnosis not present

## 2023-11-27 DIAGNOSIS — I5021 Acute systolic (congestive) heart failure: Secondary | ICD-10-CM | POA: Diagnosis present

## 2023-11-27 DIAGNOSIS — G40909 Epilepsy, unspecified, not intractable, without status epilepticus: Secondary | ICD-10-CM | POA: Diagnosis not present

## 2023-11-27 DIAGNOSIS — R0609 Other forms of dyspnea: Secondary | ICD-10-CM | POA: Diagnosis not present

## 2023-11-27 DIAGNOSIS — R7401 Elevation of levels of liver transaminase levels: Secondary | ICD-10-CM | POA: Diagnosis not present

## 2023-11-27 DIAGNOSIS — R188 Other ascites: Secondary | ICD-10-CM | POA: Diagnosis present

## 2023-11-27 DIAGNOSIS — R509 Fever, unspecified: Secondary | ICD-10-CM | POA: Diagnosis not present

## 2023-11-27 DIAGNOSIS — D6489 Other specified anemias: Secondary | ICD-10-CM | POA: Diagnosis present

## 2023-11-27 DIAGNOSIS — I6381 Other cerebral infarction due to occlusion or stenosis of small artery: Secondary | ICD-10-CM | POA: Diagnosis not present

## 2023-11-27 DIAGNOSIS — R579 Shock, unspecified: Secondary | ICD-10-CM | POA: Diagnosis not present

## 2023-11-27 DIAGNOSIS — F1011 Alcohol abuse, in remission: Secondary | ICD-10-CM | POA: Diagnosis present

## 2023-11-27 DIAGNOSIS — D72829 Elevated white blood cell count, unspecified: Secondary | ICD-10-CM | POA: Diagnosis not present

## 2023-11-27 DIAGNOSIS — E876 Hypokalemia: Secondary | ICD-10-CM | POA: Diagnosis present

## 2023-11-27 DIAGNOSIS — I214 Non-ST elevation (NSTEMI) myocardial infarction: Secondary | ICD-10-CM | POA: Diagnosis not present

## 2023-11-27 DIAGNOSIS — R748 Abnormal levels of other serum enzymes: Secondary | ICD-10-CM | POA: Diagnosis not present

## 2023-11-27 DIAGNOSIS — E43 Unspecified severe protein-calorie malnutrition: Secondary | ICD-10-CM | POA: Diagnosis present

## 2023-11-27 DIAGNOSIS — H5702 Anisocoria: Secondary | ICD-10-CM | POA: Diagnosis present

## 2023-11-27 DIAGNOSIS — F1911 Other psychoactive substance abuse, in remission: Secondary | ICD-10-CM

## 2023-11-27 DIAGNOSIS — N170 Acute kidney failure with tubular necrosis: Secondary | ICD-10-CM | POA: Diagnosis present

## 2023-11-27 DIAGNOSIS — R569 Unspecified convulsions: Principal | ICD-10-CM

## 2023-11-27 DIAGNOSIS — G936 Cerebral edema: Secondary | ICD-10-CM | POA: Diagnosis present

## 2023-11-27 DIAGNOSIS — Z781 Physical restraint status: Secondary | ICD-10-CM

## 2023-11-27 DIAGNOSIS — J9601 Acute respiratory failure with hypoxia: Secondary | ICD-10-CM | POA: Diagnosis present

## 2023-11-27 DIAGNOSIS — M6282 Rhabdomyolysis: Secondary | ICD-10-CM | POA: Diagnosis present

## 2023-11-27 DIAGNOSIS — J690 Pneumonitis due to inhalation of food and vomit: Secondary | ICD-10-CM | POA: Diagnosis present

## 2023-11-27 DIAGNOSIS — I429 Cardiomyopathy, unspecified: Secondary | ICD-10-CM | POA: Diagnosis present

## 2023-11-27 DIAGNOSIS — R4182 Altered mental status, unspecified: Secondary | ICD-10-CM

## 2023-11-27 DIAGNOSIS — K7200 Acute and subacute hepatic failure without coma: Secondary | ICD-10-CM | POA: Diagnosis present

## 2023-11-27 DIAGNOSIS — E878 Other disorders of electrolyte and fluid balance, not elsewhere classified: Secondary | ICD-10-CM | POA: Diagnosis present

## 2023-11-27 DIAGNOSIS — E782 Mixed hyperlipidemia: Secondary | ICD-10-CM | POA: Diagnosis not present

## 2023-11-27 DIAGNOSIS — N179 Acute kidney failure, unspecified: Secondary | ICD-10-CM | POA: Diagnosis not present

## 2023-11-27 DIAGNOSIS — R739 Hyperglycemia, unspecified: Secondary | ICD-10-CM | POA: Diagnosis not present

## 2023-11-27 DIAGNOSIS — R570 Cardiogenic shock: Secondary | ICD-10-CM | POA: Diagnosis present

## 2023-11-27 DIAGNOSIS — I110 Hypertensive heart disease with heart failure: Secondary | ICD-10-CM | POA: Diagnosis present

## 2023-11-27 DIAGNOSIS — E871 Hypo-osmolality and hyponatremia: Secondary | ICD-10-CM | POA: Diagnosis not present

## 2023-11-27 DIAGNOSIS — I2489 Other forms of acute ischemic heart disease: Secondary | ICD-10-CM | POA: Diagnosis present

## 2023-11-27 DIAGNOSIS — R609 Edema, unspecified: Secondary | ICD-10-CM | POA: Diagnosis not present

## 2023-11-27 DIAGNOSIS — E872 Acidosis, unspecified: Secondary | ICD-10-CM | POA: Diagnosis present

## 2023-11-27 DIAGNOSIS — R4701 Aphasia: Secondary | ICD-10-CM | POA: Diagnosis not present

## 2023-11-27 DIAGNOSIS — R6521 Severe sepsis with septic shock: Secondary | ICD-10-CM | POA: Diagnosis present

## 2023-11-27 DIAGNOSIS — G9340 Encephalopathy, unspecified: Secondary | ICD-10-CM | POA: Diagnosis not present

## 2023-11-27 DIAGNOSIS — E7800 Pure hypercholesterolemia, unspecified: Secondary | ICD-10-CM | POA: Diagnosis present

## 2023-11-27 DIAGNOSIS — T380X5A Adverse effect of glucocorticoids and synthetic analogues, initial encounter: Secondary | ICD-10-CM | POA: Diagnosis not present

## 2023-11-27 DIAGNOSIS — R061 Stridor: Secondary | ICD-10-CM | POA: Diagnosis not present

## 2023-11-27 DIAGNOSIS — Z888 Allergy status to other drugs, medicaments and biological substances status: Secondary | ICD-10-CM

## 2023-11-27 DIAGNOSIS — K567 Ileus, unspecified: Secondary | ICD-10-CM | POA: Diagnosis not present

## 2023-11-27 DIAGNOSIS — R414 Neurologic neglect syndrome: Secondary | ICD-10-CM | POA: Diagnosis not present

## 2023-11-27 DIAGNOSIS — I5082 Biventricular heart failure: Secondary | ICD-10-CM | POA: Diagnosis present

## 2023-11-27 DIAGNOSIS — R9082 White matter disease, unspecified: Secondary | ICD-10-CM | POA: Diagnosis present

## 2023-11-27 DIAGNOSIS — Z79899 Other long term (current) drug therapy: Secondary | ICD-10-CM

## 2023-11-27 DIAGNOSIS — Z751 Person awaiting admission to adequate facility elsewhere: Secondary | ICD-10-CM

## 2023-11-27 DIAGNOSIS — D649 Anemia, unspecified: Secondary | ICD-10-CM | POA: Diagnosis not present

## 2023-11-27 DIAGNOSIS — D75839 Thrombocytosis, unspecified: Secondary | ICD-10-CM | POA: Diagnosis present

## 2023-11-27 DIAGNOSIS — E8721 Acute metabolic acidosis: Secondary | ICD-10-CM | POA: Diagnosis not present

## 2023-11-27 DIAGNOSIS — Z515 Encounter for palliative care: Secondary | ICD-10-CM | POA: Diagnosis not present

## 2023-11-27 LAB — CBC
HCT: 43.6 % (ref 36.0–46.0)
Hemoglobin: 13.8 g/dL (ref 12.0–15.0)
MCH: 27.2 pg (ref 26.0–34.0)
MCHC: 31.7 g/dL (ref 30.0–36.0)
MCV: 86 fL (ref 80.0–100.0)
Platelets: 197 10*3/uL (ref 150–400)
RBC: 5.07 MIL/uL (ref 3.87–5.11)
RDW: 15.3 % (ref 11.5–15.5)
WBC: 16.1 10*3/uL — ABNORMAL HIGH (ref 4.0–10.5)
nRBC: 0 % (ref 0.0–0.2)

## 2023-11-27 LAB — RESP PANEL BY RT-PCR (RSV, FLU A&B, COVID)  RVPGX2
Influenza A by PCR: NEGATIVE
Influenza B by PCR: NEGATIVE
Resp Syncytial Virus by PCR: NEGATIVE
SARS Coronavirus 2 by RT PCR: NEGATIVE

## 2023-11-27 LAB — COMPREHENSIVE METABOLIC PANEL
ALT: 185 U/L — ABNORMAL HIGH (ref 0–44)
AST: 297 U/L — ABNORMAL HIGH (ref 15–41)
Albumin: 4 g/dL (ref 3.5–5.0)
Alkaline Phosphatase: 87 U/L (ref 38–126)
Anion gap: 15 (ref 5–15)
BUN: 17 mg/dL (ref 6–20)
CO2: 10 mmol/L — ABNORMAL LOW (ref 22–32)
Calcium: 8.5 mg/dL — ABNORMAL LOW (ref 8.9–10.3)
Chloride: 112 mmol/L — ABNORMAL HIGH (ref 98–111)
Creatinine, Ser: 2.46 mg/dL — ABNORMAL HIGH (ref 0.44–1.00)
GFR, Estimated: 23 mL/min — ABNORMAL LOW (ref >60)
Glucose, Bld: 103 mg/dL — ABNORMAL HIGH (ref 70–99)
Potassium: 4.5 mmol/L (ref 3.5–5.1)
Sodium: 137 mmol/L (ref 135–145)
Total Bilirubin: 1 mg/dL (ref 0.0–1.2)
Total Protein: 6.2 g/dL — ABNORMAL LOW (ref 6.5–8.1)

## 2023-11-27 LAB — RAPID URINE DRUG SCREEN, HOSP PERFORMED
Amphetamines: NOT DETECTED
Barbiturates: NOT DETECTED
Benzodiazepines: POSITIVE — AB
Cocaine: NOT DETECTED
Opiates: NOT DETECTED
Tetrahydrocannabinol: POSITIVE — AB

## 2023-11-27 LAB — CBC WITH DIFFERENTIAL/PLATELET
Abs Immature Granulocytes: 0.32 10*3/uL — ABNORMAL HIGH (ref 0.00–0.07)
Basophils Absolute: 0 10*3/uL (ref 0.0–0.1)
Basophils Relative: 0 %
Eosinophils Absolute: 0 10*3/uL (ref 0.0–0.5)
Eosinophils Relative: 0 %
HCT: 43.8 % (ref 36.0–46.0)
Hemoglobin: 13.9 g/dL (ref 12.0–15.0)
Immature Granulocytes: 2 %
Lymphocytes Relative: 9 %
Lymphs Abs: 1.4 10*3/uL (ref 0.7–4.0)
MCH: 27.5 pg (ref 26.0–34.0)
MCHC: 31.7 g/dL (ref 30.0–36.0)
MCV: 86.7 fL (ref 80.0–100.0)
Monocytes Absolute: 1 10*3/uL (ref 0.1–1.0)
Monocytes Relative: 6 %
Neutro Abs: 13.8 10*3/uL — ABNORMAL HIGH (ref 1.7–7.7)
Neutrophils Relative %: 83 %
Platelets: 186 10*3/uL (ref 150–400)
RBC: 5.05 MIL/uL (ref 3.87–5.11)
RDW: 15.2 % (ref 11.5–15.5)
WBC: 16.5 10*3/uL — ABNORMAL HIGH (ref 4.0–10.5)
nRBC: 0 % (ref 0.0–0.2)

## 2023-11-27 LAB — URINALYSIS, ROUTINE W REFLEX MICROSCOPIC
Bilirubin Urine: NEGATIVE
Glucose, UA: NEGATIVE mg/dL
Ketones, ur: NEGATIVE mg/dL
Leukocytes,Ua: NEGATIVE
Nitrite: NEGATIVE
Protein, ur: 100 mg/dL — AB
Specific Gravity, Urine: 1.009 (ref 1.005–1.030)
pH: 6 (ref 5.0–8.0)

## 2023-11-27 LAB — PROTIME-INR
INR: 1.4 — ABNORMAL HIGH (ref 0.8–1.2)
Prothrombin Time: 17.2 s — ABNORMAL HIGH (ref 11.4–15.2)

## 2023-11-27 LAB — I-STAT CG4 LACTIC ACID, ED: Lactic Acid, Venous: 1.3 mmol/L (ref 0.5–1.9)

## 2023-11-27 LAB — ACETAMINOPHEN LEVEL: Acetaminophen (Tylenol), Serum: <10 ug/mL — ABNORMAL LOW (ref 10–30)

## 2023-11-27 LAB — SALICYLATE LEVEL: Salicylate Lvl: <7 mg/dL — ABNORMAL LOW (ref 7.0–30.0)

## 2023-11-27 LAB — LACTIC ACID, PLASMA: Lactic Acid, Venous: 2.7 mmol/L (ref 0.5–1.9)

## 2023-11-27 LAB — AMMONIA: Ammonia: 67 umol/L — ABNORMAL HIGH (ref 9–35)

## 2023-11-27 LAB — HCG, SERUM, QUALITATIVE: Preg, Serum: NEGATIVE

## 2023-11-27 LAB — CBG MONITORING, ED: Glucose-Capillary: 101 mg/dL — ABNORMAL HIGH (ref 70–99)

## 2023-11-27 LAB — ETHANOL: Alcohol, Ethyl (B): <10 mg/dL (ref <10)

## 2023-11-27 LAB — OSMOLALITY, URINE: Osmolality, Ur: 352 mosm/kg (ref 300–900)

## 2023-11-27 MED ORDER — DOCUSATE SODIUM 100 MG PO CAPS: 100.0000 mg | ORAL_CAPSULE | Freq: Two times a day (BID) | ORAL | Status: DC | PRN

## 2023-11-27 MED ORDER — ACETAMINOPHEN 650 MG RE SUPP
650.0000 mg | Freq: Once | RECTAL | Status: AC
  Administered 2023-11-27: 650 mg via RECTAL
  Filled 2023-11-27: qty 1

## 2023-11-27 MED ORDER — SODIUM CHLORIDE 0.9 % IV SOLN
200.0000 mg | Freq: Once | INTRAVENOUS | Status: AC
  Administered 2023-11-27: 200 mg via INTRAVENOUS
  Filled 2023-11-27: qty 20

## 2023-11-27 MED ORDER — THIAMINE HCL 100 MG/ML IJ SOLN: 100.0000 mg | Freq: Every day | INTRAMUSCULAR | Status: DC

## 2023-11-27 MED ORDER — POLYETHYLENE GLYCOL 3350 17 G PO PACK: 17.0000 g | PACK | Freq: Every day | ORAL | Status: DC | PRN

## 2023-11-27 MED ORDER — SODIUM CHLORIDE 0.9 % IV SOLN: 1.0000 mg | Freq: Once | INTRAVENOUS | Status: DC

## 2023-11-27 MED ORDER — SODIUM CHLORIDE 0.9 % IV BOLUS
1000.0000 mL | Freq: Once | INTRAVENOUS | Status: AC
  Administered 2023-11-27: 1000 mL via INTRAVENOUS

## 2023-11-27 MED ORDER — ONDANSETRON HCL 4 MG/2ML IJ SOLN
4.0000 mg | Freq: Once | INTRAMUSCULAR | Status: AC
  Administered 2023-11-27: 4 mg via INTRAVENOUS

## 2023-11-27 MED ORDER — FOLIC ACID 5 MG/ML IJ SOLN
1.0000 mg | Freq: Once | INTRAMUSCULAR | Status: AC
  Administered 2023-11-28: 1 mg via INTRAVENOUS
  Filled 2023-11-27: qty 0.2

## 2023-11-27 MED ORDER — ADULT MULTIVITAMIN LIQUID CH
15.0000 mL | Freq: Every day | ORAL | Status: DC
  Filled 2023-11-27: qty 15

## 2023-11-27 MED ORDER — IPRATROPIUM-ALBUTEROL 0.5-2.5 (3) MG/3ML IN SOLN
3.0000 mL | Freq: Four times a day (QID) | RESPIRATORY_TRACT | Status: DC | PRN
  Administered 2023-12-05: 3 mL via RESPIRATORY_TRACT
  Filled 2023-11-27: qty 3

## 2023-11-27 MED ORDER — ENOXAPARIN SODIUM 30 MG/0.3ML IJ SOSY: 30.0000 mg | PREFILLED_SYRINGE | INTRAMUSCULAR | Status: DC

## 2023-11-27 NOTE — ED Triage Notes (Signed)
 Pt BIB [facility] from home. Pts friend reports last spoke to pt at 1030 and pt c/o headache. On friends arrival pt was found unresponsive, laying between bed and nightstand with vomit all over and active seizing. EMS reports pt had continuous full body seizure the entire time on scene; GCS 3. 10 mg IM versed given; seizing stopped. Pt requiring BMV initially, on arrival to hospital pt had NRB at 15L.

## 2023-11-27 NOTE — ED Provider Notes (Signed)
 [place] EMERGENCY DEPARTMENT AT [facility] Provider Note   CSN: [id number] Arrival date & time: 11/27/23  2026     History {Add pertinent medical, surgical, social history, OB history to HPI:1} Chief Complaint  Patient presents with   Seizures    Meredith Park is a 53 y.o. female.  She is brought in by EMS after being found unresponsive and seizing.  She was having bilateral tonic-clonic shaking at home.  Last seen normal was 10 AM.  She was given 10 mg of midazolam with improvement in her shaking.  They were assisting her with bag valve ventilations and currently on a nonrebreather.  She is unresponsive and unable to answer any questions, level 5 caveat.  Review of her medical record shows she does have a seizure disorder and also alcohol use  The history is provided by the EMS personnel.  Seizures Seizure activity on arrival: no   Seizure type:  Grand mal Postictal symptoms: somnolence   Return to baseline: no   PTA treatment:  Midazolam History of seizures: yes        Home Medications Prior to Admission medications   Medication Sig Start Date End Date Taking? Authorizing Provider  atorvastatin (LIPITOR) 10 MG tablet Take 10 mg by mouth daily. 07/20/22   [provider]  carvedilol (COREG) 12.5 MG tablet Take 1 tablet (12.5 mg total) by mouth 2 (two) times daily. 08/24/17   Fayrene Helper, PA-C  clonazePAM (KLONOPIN) 0.5 MG tablet Take 1 tablet (0.5 mg total) by mouth every 8 (eight) hours. 11/09/22 12/09/22  Lanae Boast, MD  ibuprofen (ADVIL) 200 MG tablet Take 600 mg by mouth every 6 (six) hours as needed for moderate pain.    [provider]  lamoTRIgine (LAMICTAL) 200 MG tablet Take 200 mg by mouth 2 (two) times daily. 10/14/22   [provider]  lamoTRIgine 50 MG TBDP Take 50 mg by mouth 2 (two) times daily. 09/15/22   [provider]  losartan (COZAAR) 50 MG tablet Take 50 mg by mouth daily. Take only if blood pressure is  over 139. 07/12/21   [provider]  Midazolam (NAYZILAM) 5 MG/0.1ML SOLN PLACE 5 MG INTO THE NOSE AS NEEDED. 11/30/22   Windell Norfolk, MD  sertraline (ZOLOFT) 50 MG tablet Take 50 mg by mouth daily. Patient not taking: Reported on 11/07/2022 10/16/22   [provider]  SUMAtriptan (IMITREX) 100 MG tablet Take 1 tablet (100 mg total) by mouth daily as needed for migraine. 12/11/21   Alvira Monday, MD      Allergies    Meperidine, Hydrochlorothiazide, and Keppra [levetiracetam]    Review of Systems   Review of Systems  Neurological:  Positive for seizures.    Physical Exam Updated Vital Signs BP (!) 89/68   Pulse (!) 118   Resp (!) 22   LMP 08/28/2020   SpO2 (!) 89%  Physical Exam Vitals and nursing note reviewed.  Constitutional:      General: She is not in acute distress.    Appearance: Normal appearance. She is well-developed.  HENT:     Head: Normocephalic and atraumatic.  Eyes:     Conjunctiva/sclera: Conjunctivae normal.  Cardiovascular:     Rate and Rhythm: Regular rhythm. Tachycardia present.     Heart sounds: No murmur heard. Pulmonary:     Effort: Pulmonary effort is normal. No respiratory distress.     Breath sounds: Normal breath sounds.  Abdominal:     Palpations: Abdomen  is soft.     Tenderness: There is no abdominal tenderness.  Musculoskeletal:        General: No swelling.     Cervical back: Neck supple.  Skin:    General: Skin is warm and dry.     Capillary Refill: Capillary refill takes less than 2 seconds.  Neurological:     Comments: She is spontaneously breathing.  She is not awake and does not withdraw to pain.  She is not following any commands.     ED Results / Procedures / Treatments   Labs (all labs ordered are listed, but only abnormal results are displayed) Labs Reviewed  CULTURE, BLOOD (ROUTINE X 2)  CULTURE, BLOOD (ROUTINE X 2)  CBC WITH DIFFERENTIAL/PLATELET  COMPREHENSIVE METABOLIC PANEL  URINALYSIS, ROUTINE W  REFLEX MICROSCOPIC  HCG, SERUM, QUALITATIVE  LACTIC ACID, PLASMA  AMMONIA  SALICYLATE LEVEL  ACETAMINOPHEN LEVEL  RAPID URINE DRUG SCREEN, HOSP PERFORMED  ETHANOL  PROTIME-INR  CBG MONITORING, ED    EKG None  Radiology No results found.  Procedures Procedures  {Document cardiac monitor, telemetry assessment procedure when appropriate:1}  Medications Ordered in ED Medications - No data to display  ED Course/ Medical Decision Making/ A&P Clinical Course as of 11/27/23 2336  Tue Nov 27, 2023  2056 Patient requiring nonrebreather to keep her sats up.  She is febrile.  Tylenol ordered.  Chest x-ray does not show any obvious gross infiltrate. [MB]  2229 Reviewed case with neurology Dr. Derry Lory.  He was going to get her set up for an EEG.  He also ordered her some Vimpat IV. [MB]  2237 Discussed with critical care Dr. Reita May who will be down to evaluate patient. [MB]  2303 Patient was seen by critical care and they are going to take the patient to the ICU for further evaluation. [MB]    Clinical Course User Index [MB] Terrilee Files, MD   {   Click here for ABCD2, HEART and other calculatorsREFRESH Note before signing :1}                              Medical Decision Making Amount and/or Complexity of Data Reviewed Labs: ordered. Radiology: ordered.  Risk OTC drugs. Prescription drug management. Decision regarding hospitalization.   This patient complains of ***; this involves an extensive number of treatment Options and is a complaint that carries with it a high risk of complications and morbidity. The differential includes ***  I ordered, reviewed and interpreted labs, which included *** I ordered medication *** and reviewed PMP when indicated. I ordered imaging studies which included *** and I independently    visualized and interpreted imaging which showed *** Additional history obtained from *** Previous records obtained and reviewed *** I consulted ***  and discussed lab and imaging findings and discussed disposition.  Cardiac monitoring reviewed, *** Social determinants considered, *** Critical Interventions: ***  After the interventions stated above, I reevaluated the patient and found *** Admission and further testing considered, ***   {Document critical care time when appropriate:1} {Document review of labs and clinical decision tools ie heart score, Chads2Vasc2 etc:1}  {Document your independent review of radiology images, and any outside records:1} {Document your discussion with family members, caretakers, and with consultants:1} {Document social determinants of health affecting pt's care:1} {Document your decision making why or why not admission, treatments were needed:1} Final Clinical Impression(s) / ED Diagnoses Final diagnoses:  None  Rx / DC Orders ED Discharge Orders     None

## 2023-11-27 NOTE — H&P (Signed)
 NAME:  Meredith Park, MRN:  [id number], DOB:  [birth year], LOS: 0 ADMISSION DATE:  11/27/2023, CONSULTATION DATE: 3/25 REFERRING MD: Dr. Charm Barges EDP, CHIEF COMPLAINT: Seizure  History of Present Illness:  53 year old female with past medical history as below, which is significant for seizure disorder followed at [facility] neurology, alcohol abuse reportedly sober for 1 year, hypertension, marijuana use, and hyperlipidemia.  Seizures are managed with Lamictal and recently added topamax with better control of seizures.  Last seen by neurology 07/19/2023.  Family reports she did have a mixup with her seizure medications a week prior to arrival.  She ran out of one of her medications and did not realize it.  This has been remedied and she is back taking both medications.  The patient was in her usual state of health until 3/25 when she was found down beside her bed by her family at approximately 7 PM.  Last known well around 2:30 PM.  Her mother found her and noticed her leg shaking and she was unresponsive.  EMS was called upon EMS arrival she was noted to have continuous full body seizure as well as vomiting.  She was treated with 10 mg of IM Versed with cessation of seizures.  She remained minimally responsive in the emergency department.  She was hypoxic requiring 10 L of oxygen.  Chest x-ray was unremarkable.  CT of the head and C-spine were unremarkable for acute issues with the exception of findings associated with chronic paranasal sinusitis.  Due to poor responsiveness PCCM was asked to evaluate for ICU admission.  Pertinent  Medical History   has a past medical history of Alcohol abuse, Alcohol withdrawal (HCC) (08/28/2018), High cholesterol, Hypertension, and Seizures (HCC).   Significant Hospital Events: Including procedures, antibiotic start and stop dates in addition to other pertinent events   3/25 admit to ICU  Interim History / Subjective:    Objective   Blood pressure 100/81, pulse (!)  101, temperature (!) 101.9 F (38.8 C), temperature source Rectal, resp. rate (!) 25, last menstrual period 08/28/2020, SpO2 100%.        Intake/Output Summary (Last 24 hours) at 11/27/2023 2308 Last data filed at 11/27/2023 2220 Gross per 24 hour  Intake 1500 ml  Output --  Net 1500 ml   There were no vitals filed for this visit.  Examination: General: Middle-age female with normal body habitus in no acute distress HENT: Normocephalic, atraumatic, pupils 5 mm and sluggish Lungs: Clear bilateral breath sounds Cardiovascular: Regular rate and rhythm, no MRG Abdomen: Soft, nontender, nondistended Extremities: No acute deformity.  No edema. Neuro: Obtunded,  Minimal groaning to painful stimuli.  Left pupil slightly larger than the right.  Resolved Hospital Problem list     Assessment & Plan:   Status epilepticus: -Neurology consulted -Admit to ICU -EEG -Vimpat -As needed Ativan -Will intubate for airway protection upon arrival to ICU -ABG -Empiric meningitis coverage -Lumbar puncture -Follow cultures  Acute respiratory failure with hypoxia: needing 10L [place] Presumably aspiration pneumonia not yet showing on chest x-ray Rule out PE considering clear chest x-ray -Supplemental oxygen -Will proceed with intubation upon arrival to ICU -Aspiration covered by meningitis antibiotics -Doppler legs and check echo  Sepsis secondary aspiration vs CNS infection - ABX as above - Trend WBC and fever curve.   History of hypertension -Holding carvedilol due to borderline blood pressures  AKI Metabolic acidosis: nongap, lactic 2.7 - Hydrate - Renal ultrasound - Bladder scan - Check CK - consider Bicarb drip -  ABG pending - Check osmolar gap  Transaminitis - Hydrate - Trend  History of ETOH abuse - reportedly sober for over a year - Thiamine, folate, MVI   Best Practice (right click and "Reselect all SmartList Selections" daily)   Diet/type: NPO DVT prophylaxis  LMWH Pressure ulcer(s): pressure ulcer assessment deferred  GI prophylaxis: PPI Lines: N/A Foley:  N/A Code Status:  full code Last date of multidisciplinary goals of care discussion [ ]   Labs   CBC: Recent Labs  Lab 11/27/23 2058  WBC 16.5*  NEUTROABS 13.8*  HGB 13.9  HCT 43.8  MCV 86.7  PLT 186    Basic Metabolic Panel: Recent Labs  Lab 11/27/23 2058  NA 137  K 4.5  CL 112*  CO2 10*  GLUCOSE 103*  BUN 17  CREATININE 2.46*  CALCIUM 8.5*   GFR: CrCl cannot be calculated (Unknown ideal weight.). Recent Labs  Lab 11/27/23 2055 11/27/23 2058  WBC  --  16.5*  LATICACIDVEN 2.7*  --     Liver Function Tests: Recent Labs  Lab 11/27/23 2058  AST 297*  ALT 185*  ALKPHOS 87  BILITOT 1.0  PROT 6.2*  ALBUMIN 4.0   No results for input(s): "LIPASE", "AMYLASE" in the last 168 hours. Recent Labs  Lab 11/27/23 2055  AMMONIA 67*    ABG No results found for: "PHART", "PCO2ART", "PO2ART", "HCO3", "TCO2", "ACIDBASEDEF", "O2SAT"   Coagulation Profile: Recent Labs  Lab 11/27/23 2058  INR 1.4*    Cardiac Enzymes: No results for input(s): "CKTOTAL", "CKMB", "CKMBINDEX", "TROPONINI" in the last 168 hours.  HbA1C: No results found for: "HGBA1C"  CBG: Recent Labs  Lab 11/27/23 2031  GLUCAP 101*    Review of Systems:   Patient is encephalopathic and/or intubated; therefore, history has been obtained from chart review.    Past Medical History:  She,  has a past medical history of Alcohol abuse, Alcohol withdrawal (HCC) (08/28/2018), High cholesterol, Hypertension, and Seizures (HCC).   Surgical History:  No past surgical history on file.   Social History:   reports that she has never smoked. She has never used smokeless tobacco. She reports current alcohol use. She reports that she does not currently use drugs after having used the following drugs: Marijuana.   Family History:  Her family history is negative for Sleep apnea.    Allergies Allergies  Allergen Reactions   Meperidine Itching   Hydrochlorothiazide Other (See Comments)    Raises ammonia level   Keppra [Levetiracetam] Other (See Comments)    Behavioral "makes her angry"     Home Medications  Prior to Admission medications   Medication Sig Start Date End Date Taking? Authorizing Provider  atorvastatin (LIPITOR) 10 MG tablet Take 10 mg by mouth daily. 07/20/22   [provider]  carvedilol (COREG) 12.5 MG tablet Take 1 tablet (12.5 mg total) by mouth 2 (two) times daily. 08/24/17   Fayrene Helper, PA-C  clonazePAM (KLONOPIN) 0.5 MG tablet Take 1 tablet (0.5 mg total) by mouth every 8 (eight) hours. 11/09/22 11/27/23  Lanae Boast, MD  ibuprofen (ADVIL) 200 MG tablet Take 600 mg by mouth every 6 (six) hours as needed for moderate pain.    [provider]  lamoTRIgine (LAMICTAL) 200 MG tablet Take 200 mg by mouth 2 (two) times daily. 10/14/22   [provider]  lamoTRIgine 50 MG TBDP Take 50 mg by mouth 2 (two) times daily. 09/15/22   [provider]  losartan (COZAAR) 50 MG tablet Take  50 mg by mouth daily. Take only if blood pressure is over 139. 07/12/21   [provider]  Midazolam (NAYZILAM) 5 MG/0.1ML SOLN PLACE 5 MG INTO THE NOSE AS NEEDED. 11/30/22   Windell Norfolk, MD  sertraline (ZOLOFT) 50 MG tablet Take 50 mg by mouth daily. Patient not taking: Reported on 11/07/2022 10/16/22   [provider]  SUMAtriptan (IMITREX) 100 MG tablet Take 1 tablet (100 mg total) by mouth daily as needed for migraine. 12/11/21   Alvira Monday, MD  topiramate (TOPAMAX) 100 MG tablet Take 100 mg by mouth daily. 05/31/23   [provider]     Critical care time: 53 minutes     Joneen Roach, AGACNP-BC [facility]  See Amion for personal pager PCCM on call pager [phone] until 7pm. Please call Elink 7p-7a. [phone]  11/27/2023 11:49 PM

## 2023-11-27 NOTE — ED Notes (Signed)
 Called social work and left a VM

## 2023-11-27 NOTE — Progress Notes (Signed)
 Brief neuro Note:  On my exam, patient has no gag, no corneals, obtunded mentation and no response to pain. Despite elevated creatinine, CT angio head and neck is warranted to rule out basilar thrombosis. Althou, seizures and status can explain patchy brainstem reflexes but given the time sensative nature of identifying a basilar thrombus, we will get CTA h + n despite AKI.  Erick Blinks [facility]

## 2023-11-28 ENCOUNTER — Inpatient Hospital Stay (HOSPITAL_COMMUNITY)

## 2023-11-28 ENCOUNTER — Other Ambulatory Visit (HOSPITAL_COMMUNITY)

## 2023-11-28 ENCOUNTER — Encounter (HOSPITAL_COMMUNITY)

## 2023-11-28 DIAGNOSIS — R579 Shock, unspecified: Secondary | ICD-10-CM

## 2023-11-28 DIAGNOSIS — J9601 Acute respiratory failure with hypoxia: Secondary | ICD-10-CM | POA: Diagnosis not present

## 2023-11-28 DIAGNOSIS — R0609 Other forms of dyspnea: Secondary | ICD-10-CM | POA: Diagnosis not present

## 2023-11-28 DIAGNOSIS — A419 Sepsis, unspecified organism: Secondary | ICD-10-CM

## 2023-11-28 DIAGNOSIS — R569 Unspecified convulsions: Secondary | ICD-10-CM | POA: Diagnosis not present

## 2023-11-28 DIAGNOSIS — G40901 Epilepsy, unspecified, not intractable, with status epilepticus: Secondary | ICD-10-CM | POA: Diagnosis not present

## 2023-11-28 DIAGNOSIS — E8721 Acute metabolic acidosis: Secondary | ICD-10-CM

## 2023-11-28 DIAGNOSIS — N179 Acute kidney failure, unspecified: Secondary | ICD-10-CM | POA: Diagnosis not present

## 2023-11-28 DIAGNOSIS — R7401 Elevation of levels of liver transaminase levels: Secondary | ICD-10-CM

## 2023-11-28 LAB — POCT I-STAT 7, (LYTES, BLD GAS, ICA,H+H)
Acid-base deficit: 12 mmol/L — ABNORMAL HIGH (ref 0.0–2.0)
Acid-base deficit: 10 mmol/L — ABNORMAL HIGH (ref 0.0–2.0)
Bicarbonate: 16.6 mmol/L — ABNORMAL LOW (ref 20.0–28.0)
Bicarbonate: 16.6 mmol/L — ABNORMAL LOW (ref 20.0–28.0)
Calcium, Ion: 1.01 mmol/L — ABNORMAL LOW (ref 1.15–1.40)
Calcium, Ion: 1.03 mmol/L — ABNORMAL LOW (ref 1.15–1.40)
HCT: 37 % (ref 36.0–46.0)
HCT: 41 % (ref 36.0–46.0)
Hemoglobin: 12.6 g/dL (ref 12.0–15.0)
Hemoglobin: 13.9 g/dL (ref 12.0–15.0)
O2 Saturation: 100 %
O2 Saturation: 100 %
Patient temperature: 99
Patient temperature: 97.9
Potassium: 2.8 mmol/L — ABNORMAL LOW (ref 3.5–5.1)
Potassium: 2.8 mmol/L — ABNORMAL LOW (ref 3.5–5.1)
Sodium: 142 mmol/L (ref 135–145)
Sodium: 140 mmol/L (ref 135–145)
TCO2: 18 mmol/L — ABNORMAL LOW (ref 22–32)
TCO2: 18 mmol/L — ABNORMAL LOW (ref 22–32)
pCO2 arterial: 47.6 mmHg (ref 32–48)
pCO2 arterial: 36.3 mmHg (ref 32–48)
pH, Arterial: 7.15 — CL (ref 7.35–7.45)
pH, Arterial: 7.266 — ABNORMAL LOW (ref 7.35–7.45)
pO2, Arterial: 353 mmHg — ABNORMAL HIGH (ref 83–108)
pO2, Arterial: 223 mmHg — ABNORMAL HIGH (ref 83–108)

## 2023-11-28 LAB — LACTIC ACID, PLASMA: Lactic Acid, Venous: 1.6 mmol/L (ref 0.5–1.9)

## 2023-11-28 LAB — GLUCOSE, CAPILLARY
Glucose-Capillary: 157 mg/dL — ABNORMAL HIGH (ref 70–99)
Glucose-Capillary: 196 mg/dL — ABNORMAL HIGH (ref 70–99)

## 2023-11-28 LAB — OSMOLALITY: Osmolality: 307 mosm/kg — ABNORMAL HIGH (ref 275–295)

## 2023-11-28 LAB — BASIC METABOLIC PANEL
Anion gap: 12 (ref 5–15)
BUN: 35 mg/dL — ABNORMAL HIGH (ref 6–20)
CO2: 19 mmol/L — ABNORMAL LOW (ref 22–32)
Calcium: 6.7 mg/dL — ABNORMAL LOW (ref 8.9–10.3)
Chloride: 103 mmol/L (ref 98–111)
Creatinine, Ser: 3.06 mg/dL — ABNORMAL HIGH (ref 0.44–1.00)
GFR, Estimated: 18 mL/min — ABNORMAL LOW (ref >60)
Glucose, Bld: 189 mg/dL — ABNORMAL HIGH (ref 70–99)
Potassium: 3.5 mmol/L (ref 3.5–5.1)
Sodium: 134 mmol/L — ABNORMAL LOW (ref 135–145)

## 2023-11-28 LAB — HIV ANTIBODY (ROUTINE TESTING W REFLEX): HIV Screen 4th Generation wRfx: NONREACTIVE

## 2023-11-28 LAB — MENINGITIS/ENCEPHALITIS PANEL (CSF)

## 2023-11-28 LAB — CK: Total CK: 16667 U/L — ABNORMAL HIGH (ref 38–234)

## 2023-11-28 LAB — CSF CELL COUNT WITH DIFFERENTIAL
RBC Count, CSF: 1045 /mm3 — ABNORMAL HIGH
Tube #: 14
WBC, CSF: 3 /mm3 (ref 0–5)

## 2023-11-28 LAB — ECHOCARDIOGRAM COMPLETE
Area-P 1/2: 3.91 cm2
Calc EF: 47.1 %
Height: 62.008 in
S' Lateral: 3.9 cm
Single Plane A2C EF: 47.5 %
Single Plane A4C EF: 52.7 %
Weight: 2282.2 [oz_av]

## 2023-11-28 LAB — MAGNESIUM: Magnesium: 2.1 mg/dL (ref 1.7–2.4)

## 2023-11-28 LAB — COMPREHENSIVE METABOLIC PANEL
ALT: 279 U/L — ABNORMAL HIGH (ref 0–44)
AST: 652 U/L — ABNORMAL HIGH (ref 15–41)
Albumin: 3.1 g/dL — ABNORMAL LOW (ref 3.5–5.0)
Alkaline Phosphatase: 74 U/L (ref 38–126)
Anion gap: 15 (ref 5–15)
BUN: 27 mg/dL — ABNORMAL HIGH (ref 6–20)
CO2: 17 mmol/L — ABNORMAL LOW (ref 22–32)
Calcium: 7.8 mg/dL — ABNORMAL LOW (ref 8.9–10.3)
Chloride: 109 mmol/L (ref 98–111)
Creatinine, Ser: 2.8 mg/dL — ABNORMAL HIGH (ref 0.44–1.00)
GFR, Estimated: 20 mL/min — ABNORMAL LOW (ref >60)
Glucose, Bld: 103 mg/dL — ABNORMAL HIGH (ref 70–99)
Potassium: 2.6 mmol/L — CL (ref 3.5–5.1)
Sodium: 141 mmol/L (ref 135–145)
Total Bilirubin: 1.5 mg/dL — ABNORMAL HIGH (ref 0.0–1.2)
Total Protein: 4.8 g/dL — ABNORMAL LOW (ref 6.5–8.1)

## 2023-11-28 LAB — CREATININE, SERUM
Creatinine, Ser: 2.38 mg/dL — ABNORMAL HIGH (ref 0.44–1.00)
GFR, Estimated: 24 mL/min — ABNORMAL LOW (ref >60)

## 2023-11-28 LAB — CBC
HCT: 43.4 % (ref 36.0–46.0)
Hemoglobin: 14.1 g/dL (ref 12.0–15.0)
MCH: 27.6 pg (ref 26.0–34.0)
MCHC: 32.5 g/dL (ref 30.0–36.0)
MCV: 85.1 fL (ref 80.0–100.0)
Platelets: 93 10*3/uL — ABNORMAL LOW (ref 150–400)
RBC: 5.1 MIL/uL (ref 3.87–5.11)
RDW: 15.5 % (ref 11.5–15.5)
WBC: 15.6 10*3/uL — ABNORMAL HIGH (ref 4.0–10.5)
nRBC: 0.3 % — ABNORMAL HIGH (ref 0.0–0.2)

## 2023-11-28 LAB — PROTEIN AND GLUCOSE, CSF
Glucose, CSF: 71 mg/dL — ABNORMAL HIGH (ref 40–70)
Total  Protein, CSF: 74 mg/dL — ABNORMAL HIGH (ref 15–45)

## 2023-11-28 LAB — SODIUM, URINE, RANDOM: Sodium, Ur: 129 mmol/L

## 2023-11-28 LAB — MRSA NEXT GEN BY PCR, NASAL: MRSA by PCR Next Gen: NOT DETECTED

## 2023-11-28 LAB — CREATININE, URINE, RANDOM: Creatinine, Urine: 37 mg/dL

## 2023-11-28 MED ORDER — POTASSIUM CHLORIDE 10 MEQ/100ML IV SOLN
10.0000 meq | INTRAVENOUS | Status: AC
  Administered 2023-11-28 (×4): 10 meq via INTRAVENOUS
  Filled 2023-11-28 (×4): qty 100

## 2023-11-28 MED ORDER — POTASSIUM CHLORIDE 20 MEQ PO PACK
40.0000 meq | PACK | Freq: Once | ORAL | Status: AC
  Administered 2023-11-28: 40 meq
  Filled 2023-11-28: qty 2

## 2023-11-28 MED ORDER — VANCOMYCIN HCL 750 MG/150ML IV SOLN
750.0000 mg | INTRAVENOUS | Status: DC
  Administered 2023-11-29: 750 mg via INTRAVENOUS
  Filled 2023-11-28: qty 150

## 2023-11-28 MED ORDER — SODIUM CHLORIDE 0.9 % IV SOLN
250.0000 mL | INTRAVENOUS | Status: AC
  Administered 2023-11-28: 250 mL via INTRAVENOUS

## 2023-11-28 MED ORDER — ROCURONIUM BROMIDE 10 MG/ML (PF) SYRINGE
100.0000 mg | PREFILLED_SYRINGE | Freq: Once | INTRAVENOUS | Status: DC
  Filled 2023-11-28: qty 10

## 2023-11-28 MED ORDER — STERILE WATER FOR INJECTION IV SOLN
INTRAVENOUS | Status: DC
  Filled 2023-11-28: qty 150
  Filled 2023-11-28: qty 1000
  Filled 2023-11-28: qty 150

## 2023-11-28 MED ORDER — DOCUSATE SODIUM 50 MG/5ML PO LIQD
100.0000 mg | Freq: Two times a day (BID) | ORAL | Status: DC
Start: 2023-11-28 — End: 2023-12-12
  Administered 2023-11-28 – 2023-12-10 (×20): 100 mg
  Filled 2023-11-28 (×20): qty 10

## 2023-11-28 MED ORDER — ETOMIDATE 2 MG/ML IV SOLN
30.0000 mg | Freq: Once | INTRAVENOUS | Status: DC
  Filled 2023-11-28: qty 20

## 2023-11-28 MED ORDER — ETOMIDATE 2 MG/ML IV SOLN
10.0000 mg | Freq: Once | INTRAVENOUS | Status: AC
  Administered 2023-11-28: 10 mg via INTRAVENOUS

## 2023-11-28 MED ORDER — IOHEXOL 350 MG/ML SOLN
75.0000 mL | Freq: Once | INTRAVENOUS | Status: AC | PRN
  Administered 2023-11-28: 75 mL via INTRAVENOUS

## 2023-11-28 MED ORDER — ADULT MULTIVITAMIN W/MINERALS CH
1.0000 | ORAL_TABLET | Freq: Every day | ORAL | Status: DC
  Administered 2023-11-28 – 2024-01-02 (×34): 1
  Filled 2023-11-28 (×34): qty 1

## 2023-11-28 MED ORDER — OSMOLITE 1.5 CAL PO LIQD
1000.0000 mL | ORAL | Status: AC
  Administered 2023-11-28 – 2023-12-24 (×22): 1000 mL
  Filled 2023-11-28 (×4): qty 1000

## 2023-11-28 MED ORDER — ORAL CARE MOUTH RINSE: 15.0000 mL | OROMUCOSAL | Status: DC | PRN

## 2023-11-28 MED ORDER — ETOMIDATE 2 MG/ML IV SOLN
INTRAVENOUS | Status: AC
  Filled 2023-11-28: qty 20

## 2023-11-28 MED ORDER — DEXTROSE 5 % IV SOLN
700.0000 mg | Freq: Once | INTRAVENOUS | Status: AC
  Administered 2023-11-28: 700 mg via INTRAVENOUS
  Filled 2023-11-28: qty 14

## 2023-11-28 MED ORDER — VASOPRESSIN 20 UNITS/100 ML INFUSION FOR SHOCK
0.0000 [IU]/min | INTRAVENOUS | Status: DC
  Administered 2023-11-28 – 2023-12-01 (×6): 0.03 [IU]/min via INTRAVENOUS
  Filled 2023-11-28 (×6): qty 100

## 2023-11-28 MED ORDER — SODIUM CHLORIDE 0.9 % IV SOLN
2.0000 g | Freq: Three times a day (TID) | INTRAVENOUS | Status: DC
  Administered 2023-11-28 – 2023-11-29 (×3): 2 g via INTRAVENOUS
  Filled 2023-11-28 (×3): qty 2000

## 2023-11-28 MED ORDER — CALCIUM GLUCONATE-NACL 1-0.675 GM/50ML-% IV SOLN
1.0000 g | Freq: Once | INTRAVENOUS | Status: AC
  Administered 2023-11-28: 1000 mg via INTRAVENOUS
  Filled 2023-11-28: qty 50

## 2023-11-28 MED ORDER — PROPOFOL 1000 MG/100ML IV EMUL: 0.0000 ug/kg/min | INTRAVENOUS | Status: DC

## 2023-11-28 MED ORDER — FENTANYL CITRATE PF 50 MCG/ML IJ SOSY
50.0000 ug | PREFILLED_SYRINGE | INTRAMUSCULAR | Status: DC | PRN
  Administered 2023-12-02 – 2023-12-03 (×6): 50 ug via INTRAVENOUS
  Administered 2023-12-03: 100 ug via INTRAVENOUS
  Administered 2023-12-03 (×3): 50 ug via INTRAVENOUS
  Administered 2023-12-04: 100 ug via INTRAVENOUS
  Administered 2023-12-04: 50 ug via INTRAVENOUS
  Administered 2023-12-04 (×4): 100 ug via INTRAVENOUS
  Administered 2023-12-04: 50 ug via INTRAVENOUS
  Administered 2023-12-04 (×2): 100 ug via INTRAVENOUS
  Administered 2023-12-05: 50 ug via INTRAVENOUS
  Administered 2023-12-05: 100 ug via INTRAVENOUS
  Administered 2023-12-05: 50 ug via INTRAVENOUS
  Administered 2023-12-05: 100 ug via INTRAVENOUS
  Filled 2023-11-28 (×2): qty 1
  Filled 2023-11-28 (×2): qty 2
  Filled 2023-11-28: qty 1
  Filled 2023-11-28: qty 2
  Filled 2023-11-28 (×3): qty 1
  Filled 2023-11-28 (×3): qty 2
  Filled 2023-11-28: qty 1
  Filled 2023-11-28: qty 2
  Filled 2023-11-28: qty 1
  Filled 2023-11-28: qty 2
  Filled 2023-11-28 (×2): qty 1
  Filled 2023-11-28 (×2): qty 2
  Filled 2023-11-28 (×4): qty 1
  Filled 2023-11-28: qty 2

## 2023-11-28 MED ORDER — MIDAZOLAM-SODIUM CHLORIDE 100-0.9 MG/100ML-% IV SOLN
0.5000 mg/h | INTRAVENOUS | Status: DC
  Administered 2023-11-28: 2 mg/h via INTRAVENOUS
  Filled 2023-11-28: qty 100

## 2023-11-28 MED ORDER — LACTATED RINGERS IV SOLN: INTRAVENOUS | Status: DC

## 2023-11-28 MED ORDER — DEXMEDETOMIDINE HCL IN NACL 400 MCG/100ML IV SOLN
0.0000 ug/kg/h | INTRAVENOUS | Status: DC
  Administered 2023-11-28 – 2023-11-29 (×3): 0.4 ug/kg/h via INTRAVENOUS
  Administered 2023-11-29: 0.6 ug/kg/h via INTRAVENOUS
  Administered 2023-12-01: 0.5 ug/kg/h via INTRAVENOUS
  Administered 2023-12-02: 0.2 ug/kg/h via INTRAVENOUS
  Administered 2023-12-02: 0.7 ug/kg/h via INTRAVENOUS
  Administered 2023-12-03: 0.9 ug/kg/h via INTRAVENOUS
  Administered 2023-12-03 (×2): 0.7 ug/kg/h via INTRAVENOUS
  Administered 2023-12-04 – 2023-12-05 (×7): 1.2 ug/kg/h via INTRAVENOUS
  Filled 2023-11-28 (×2): qty 100
  Filled 2023-11-28: qty 200
  Filled 2023-11-28 (×14): qty 100

## 2023-11-28 MED ORDER — ROCURONIUM BROMIDE 10 MG/ML (PF) SYRINGE
60.0000 mg | PREFILLED_SYRINGE | Freq: Once | INTRAVENOUS | Status: AC
  Administered 2023-11-28: 60 mg via INTRAVENOUS

## 2023-11-28 MED ORDER — NOREPINEPHRINE 4 MG/250ML-% IV SOLN
2.0000 ug/min | INTRAVENOUS | Status: DC
  Administered 2023-11-28: 20 ug/min via INTRAVENOUS
  Administered 2023-11-28: 6 ug/min via INTRAVENOUS
  Administered 2023-11-28 (×2): 20 ug/min via INTRAVENOUS
  Administered 2023-11-28: 10 ug/min via INTRAVENOUS
  Administered 2023-11-29: 21 ug/min via INTRAVENOUS
  Administered 2023-11-29: 12 ug/min via INTRAVENOUS
  Administered 2023-11-29: 20 ug/min via INTRAVENOUS
  Administered 2023-11-29 (×3): 17 ug/min via INTRAVENOUS
  Administered 2023-11-30: 12 ug/min via INTRAVENOUS
  Administered 2023-11-30: 14 ug/min via INTRAVENOUS
  Administered 2023-11-30 – 2023-12-02 (×2): 4 ug/min via INTRAVENOUS
  Administered 2023-12-03: 3 ug/min via INTRAVENOUS
  Administered 2023-12-04 – 2023-12-06 (×3): 2 ug/min via INTRAVENOUS
  Administered 2023-12-07: 5 ug/min via INTRAVENOUS
  Administered 2023-12-08: 4 ug/min via INTRAVENOUS
  Administered 2023-12-10: 2 ug/min via INTRAVENOUS
  Filled 2023-11-28 (×13): qty 250
  Filled 2023-11-28: qty 500
  Filled 2023-11-28 (×5): qty 250

## 2023-11-28 MED ORDER — MIDAZOLAM HCL 2 MG/2ML IJ SOLN
4.0000 mg | Freq: Once | INTRAMUSCULAR | Status: AC
  Administered 2023-11-28: 4 mg via INTRAVENOUS
  Filled 2023-11-28: qty 4

## 2023-11-28 MED ORDER — VITAL HIGH PROTEIN PO LIQD: 1000.0000 mL | ORAL | Status: DC

## 2023-11-28 MED ORDER — ACETAMINOPHEN 325 MG PO TABS
650.0000 mg | ORAL_TABLET | Freq: Four times a day (QID) | ORAL | Status: DC | PRN
  Administered 2023-12-02 – 2023-12-03 (×2): 650 mg
  Filled 2023-11-28 (×2): qty 2

## 2023-11-28 MED ORDER — FAMOTIDINE 20 MG PO TABS
20.0000 mg | ORAL_TABLET | Freq: Every day | ORAL | Status: DC
  Administered 2023-11-28 – 2023-12-10 (×13): 20 mg
  Filled 2023-11-28 (×13): qty 1

## 2023-11-28 MED ORDER — FAMOTIDINE 20 MG PO TABS: 20.0000 mg | ORAL_TABLET | Freq: Two times a day (BID) | ORAL | Status: DC

## 2023-11-28 MED ORDER — POLYETHYLENE GLYCOL 3350 17 G PO PACK
17.0000 g | PACK | Freq: Every day | ORAL | Status: DC
  Administered 2023-11-28 – 2023-12-06 (×3): 17 g
  Filled 2023-11-28 (×4): qty 1

## 2023-11-28 MED ORDER — LEVETIRACETAM IN NACL 1000 MG/100ML IV SOLN
1000.0000 mg | INTRAVENOUS | Status: AC
  Administered 2023-11-28 (×2): 1000 mg via INTRAVENOUS
  Filled 2023-11-28 (×2): qty 100

## 2023-11-28 MED ORDER — THIAMINE HCL 100 MG/ML IJ SOLN
250.0000 mg | Freq: Every day | INTRAVENOUS | Status: AC
  Administered 2023-11-30 – 2023-12-05 (×6): 250 mg via INTRAVENOUS
  Filled 2023-11-28 (×6): qty 2.5

## 2023-11-28 MED ORDER — DEXTROSE 5 % IV SOLN
10.0000 mg/kg | INTRAVENOUS | Status: DC
  Administered 2023-11-28: 630 mg via INTRAVENOUS
  Filled 2023-11-28 (×2): qty 12.6

## 2023-11-28 MED ORDER — CHLORHEXIDINE GLUCONATE CLOTH 2 % EX PADS
6.0000 | MEDICATED_PAD | Freq: Every day | CUTANEOUS | Status: DC
  Administered 2023-11-28 – 2023-12-11 (×15): 6 via TOPICAL

## 2023-11-28 MED ORDER — MIDAZOLAM HCL 2 MG/2ML IJ SOLN
INTRAMUSCULAR | Status: AC
  Filled 2023-11-28: qty 2

## 2023-11-28 MED ORDER — SODIUM CHLORIDE 0.9 % IV SOLN
2.0000 g | Freq: Once | INTRAVENOUS | Status: AC
  Administered 2023-11-28: 2 g via INTRAVENOUS
  Filled 2023-11-28: qty 2000

## 2023-11-28 MED ORDER — CLONAZEPAM 0.5 MG PO TABS
0.5000 mg | ORAL_TABLET | Freq: Two times a day (BID) | ORAL | Status: DC
  Administered 2023-11-28 – 2023-11-29 (×3): 0.5 mg
  Filled 2023-11-28 (×3): qty 1

## 2023-11-28 MED ORDER — SODIUM BICARBONATE 8.4 % IV SOLN
INTRAVENOUS | Status: AC
  Administered 2023-11-28: 100 meq
  Filled 2023-11-28: qty 100

## 2023-11-28 MED ORDER — FOLIC ACID 1 MG PO TABS
1.0000 mg | ORAL_TABLET | Freq: Every day | ORAL | Status: DC
  Administered 2023-11-29 – 2023-12-31 (×32): 1 mg
  Filled 2023-11-28 (×32): qty 1

## 2023-11-28 MED ORDER — SODIUM CHLORIDE 0.9 % IV SOLN
2.0000 g | Freq: Two times a day (BID) | INTRAVENOUS | Status: DC
  Administered 2023-11-28 – 2023-11-29 (×4): 2 g via INTRAVENOUS
  Filled 2023-11-28 (×4): qty 20

## 2023-11-28 MED ORDER — LEVETIRACETAM IN NACL 500 MG/100ML IV SOLN
500.0000 mg | Freq: Two times a day (BID) | INTRAVENOUS | Status: DC
  Administered 2023-11-28 – 2023-12-01 (×7): 500 mg via INTRAVENOUS
  Filled 2023-11-28 (×8): qty 100

## 2023-11-28 MED ORDER — PROSOURCE TF20 ENFIT COMPATIBL EN LIQD
60.0000 mL | Freq: Every day | ENTERAL | Status: DC
  Administered 2023-11-28 – 2023-12-06 (×9): 60 mL
  Filled 2023-11-28 (×10): qty 60

## 2023-11-28 MED ORDER — NOREPINEPHRINE 4 MG/250ML-% IV SOLN
INTRAVENOUS | Status: AC
Start: 2023-11-28 — End: 2023-11-28
  Filled 2023-11-28: qty 250

## 2023-11-28 MED ORDER — MIDAZOLAM HCL 2 MG/2ML IJ SOLN
2.0000 mg | Freq: Once | INTRAMUSCULAR | Status: AC
  Administered 2023-11-28: 2 mg via INTRAVENOUS

## 2023-11-28 MED ORDER — FENTANYL CITRATE PF 50 MCG/ML IJ SOSY
50.0000 ug | PREFILLED_SYRINGE | INTRAMUSCULAR | Status: AC | PRN
  Administered 2023-12-01 – 2023-12-02 (×3): 50 ug via INTRAVENOUS
  Filled 2023-11-28 (×2): qty 1

## 2023-11-28 MED ORDER — LACTATED RINGERS IV BOLUS
500.0000 mL | Freq: Once | INTRAVENOUS | Status: AC
  Administered 2023-11-28: 1000 mL via INTRAVENOUS

## 2023-11-28 MED ORDER — ORAL CARE MOUTH RINSE
15.0000 mL | OROMUCOSAL | Status: DC
  Administered 2023-11-28 – 2023-12-05 (×90): 15 mL via OROMUCOSAL

## 2023-11-28 MED ORDER — THIAMINE HCL 100 MG/ML IJ SOLN
100.0000 mg | Freq: Every day | INTRAMUSCULAR | Status: DC
  Administered 2023-12-06 – 2023-12-11 (×6): 100 mg via INTRAVENOUS
  Filled 2023-11-28 (×6): qty 2

## 2023-11-28 MED ORDER — SODIUM CHLORIDE 0.9 % IV SOLN: 2000.0000 mg | Freq: Once | INTRAVENOUS | Status: DC

## 2023-11-28 MED ORDER — ROCURONIUM BROMIDE 10 MG/ML (PF) SYRINGE
PREFILLED_SYRINGE | INTRAVENOUS | Status: AC
  Filled 2023-11-28: qty 10

## 2023-11-28 MED ORDER — THIAMINE HCL 100 MG/ML IJ SOLN
500.0000 mg | Freq: Three times a day (TID) | INTRAVENOUS | Status: AC
  Administered 2023-11-28 – 2023-11-29 (×5): 500 mg via INTRAVENOUS
  Filled 2023-11-28: qty 5
  Filled 2023-11-28 (×2): qty 500
  Filled 2023-11-28 (×3): qty 5

## 2023-11-28 MED ORDER — SODIUM CHLORIDE 0.9 % IV SOLN
200.0000 mg | Freq: Two times a day (BID) | INTRAVENOUS | Status: DC
  Administered 2023-11-28 – 2023-11-30 (×5): 200 mg via INTRAVENOUS
  Filled 2023-11-28 (×7): qty 20

## 2023-11-28 MED ORDER — VANCOMYCIN HCL 2000 MG/400ML IV SOLN
2000.0000 mg | Freq: Once | INTRAVENOUS | Status: AC
  Administered 2023-11-28: 2000 mg via INTRAVENOUS
  Filled 2023-11-28: qty 400

## 2023-11-28 MED ORDER — LACTATED RINGERS IV BOLUS
1000.0000 mL | Freq: Once | INTRAVENOUS | Status: AC
  Administered 2023-11-28: 1000 mL via INTRAVENOUS

## 2023-11-28 NOTE — Progress Notes (Signed)
 Pharmacy Antibiotic Note  Meredith Park is a 53 y.o. female admitted on 11/27/2023 with  seizures, rule out meningitis .  Pharmacy has been consulted for Vancomycin, Ceftriaxone, Ampicillin, Acyclovir dosing. WBC is elevated. Noted renal dysfunction.   Plan: Vancomycin 750 mg IV q24h >>>No estimated AUC-using traditional dosing with rule out meningitis  Ceftriaxone 2g IV q12h Ampicillin 2g IV q8h  Acyclovir 10 mg/kg IV q24h Trend WBC, temp, renal function  F/U infectious work-up Drug levels as indicated  Height: 5' 2.01" (157.5 cm) Weight: 63 kg (138 lb 14.2 oz) IBW/kg (Calculated) : 50.12  Temp (24hrs), Avg:101.9 F (38.8 C), Min:101.9 F (38.8 C), Max:101.9 F (38.8 C)  Recent Labs  Lab 11/27/23 2055 11/27/23 2058 11/27/23 2345 11/27/23 2347  WBC  --  16.1*  16.5*  --   --   CREATININE  --  2.46*  --   --   LATICACIDVEN 2.7*  --  1.6 1.3    Estimated Creatinine Clearance: 23.4 mL/min (A) (by C-G formula based on SCr of 2.46 mg/dL (H)).    Allergies  Allergen Reactions   Meperidine Itching   Hydrochlorothiazide Other (See Comments)    Raises ammonia level   Keppra [Levetiracetam] Other (See Comments)    Behavioral "makes her angry"    Abran Duke, PharmD, BCPS Clinical Pharmacist Phone: [phone]

## 2023-11-28 NOTE — Procedures (Signed)
 Lumbar Puncture Procedure Note  Meredith Park  [id number]  1970/11/06  Date:11/28/23  Time:2:31 PM   Provider Performing:Arieon Corcoran   Procedure: Lumbar Puncture (16073)  Indication(s) Rule out meningitis  Consent Risks of the procedure as well as the alternatives and risks of each were explained to the patient and/or caregiver.  Consent for the procedure was obtained and is signed in the bedside chart  Anesthesia Topical only with 1% lidocaine    Time Out Verified patient identification, verified procedure, site/side was marked, verified correct patient position, special equipment/implants available, medications/allergies/relevant history reviewed, required imaging and test results available.   Sterile Technique Maximal sterile technique including sterile barrier drape, hand hygiene, sterile gown, sterile gloves, mask, hair covering.    Procedure Description Using palpation, approximate location of L3-L4 space identified.   Lidocaine used to anesthetize skin and subcutaneous tissue overlying this area.  A 20g spinal needle was then used to access the subarachnoid space. Opening pressure:Not obtained. Closing pressure:Not obtained. 16 ml CSF obtained. Fluid is pink tinged/xanthochromic  Complications/Tolerance None; patient tolerated the procedure well.  EBL none  Specimen(s) CSF - cell count, Gram stain, cytology and meningitis panel.   Lynnell Catalan, MD [facility] ICU Physician [facility] [place] [facility]  Pager: [phone] Or Epic Secure Chat After hours: [phone].  11/28/2023, 2:34 PM

## 2023-11-28 NOTE — Procedures (Signed)
 Central Venous Catheter Insertion Procedure Note  Meredith Park  [id number]  07/27/1971  Date:11/28/23  Time:1:06 PM   Provider Performing:Sophiya Morello Celine Mans   Procedure: Insertion of Non-tunneled Central Venous Catheter(36556) without US guidance  Indication(s) Medication administration and Difficult access  Consent Risks of the procedure as well as the alternatives and risks of each were explained to the patient and/or caregiver.  Consent for the procedure was obtained and is signed in the bedside chart  Anesthesia Topical only with 1% lidocaine   Timeout Verified patient identification, verified procedure, site/side was marked, verified correct patient position, special equipment/implants available, medications/allergies/relevant history reviewed, required imaging and test results available.  Sterile Technique Maximal sterile technique including full sterile barrier drape, hand hygiene, sterile gown, sterile gloves, mask, hair covering, sterile ultrasound probe cover (if used).  Procedure Description Area of catheter insertion was cleaned with chlorhexidine and draped in sterile fashion.  Without real-time ultrasound guidance a central venous catheter was placed into the left subclavian vein. Nonpulsatile blood flow and easy flushing noted in all ports.  The catheter was sutured in place and sterile dressing applied.  Complications/Tolerance None; patient tolerated the procedure well. Chest X-ray is ordered to verify placement for internal jugular or subclavian cannulation.   Chest x-ray is not ordered for femoral cannulation.  EBL Minimal  Specimen(s) None   Rutherford Guys, PA - [facility] [place] [facility] For pager details, please see AMION or use Epic chat  After 1900, please call ELINK for cross coverage needs 11/28/2023, 1:06 PM

## 2023-11-28 NOTE — Consult Note (Signed)
 NEUROLOGY CONSULT NOTE   Date of service: November 28, 2023 Patient Name: Meredith Park MRN:  [id number] DOB:  1970-10-19 Chief Complaint: "seizures" Requesting Provider: Gwynn Burly, MD  History of Present Illness  Meredith Park is a 53 y.o. female with hx of alcohol and substance use, Htn, seizures on lamotrigine and topamax who presents with seizure of unclear duration. Was fine in the afternoon when family saw her around 1430. Around 1930, they were unable to get in touch with her. Checked in on her and found her wedged between bed and the nightstand in her room. Her left arm was jerking and eyes rolled back. EMS called. She got Versed and was brought in to the ED in the evening.  She has been observed for several hours and continues to be obtunded and therefore we were consulted for further evaluation and workup.  On my evaluation, she is obtunded. Most of the histroy is provided by daughter and mother at the bedside.  Daughter reports that she ran out of her lamotrigine but got it filled last week and resumed at her home dose and has been taking it for 4 days.  No recent fevers, chills, URI, UTI or gastroenteritis like symptoms. She was fine and at her baseline when family last saw her at 1430.  ROS  Unable to ascertain due to obtunded mentation.  Past History   Past Medical History:  Diagnosis Date   Alcohol abuse    Alcohol withdrawal (HCC) 08/28/2018   High cholesterol    Hypertension    Seizures (HCC)     No past surgical history on file.  Family History: Family History  Problem Relation Age of Onset   Sleep apnea Neg Hx     Social History  reports that she has never smoked. She has never used smokeless tobacco. She reports current alcohol use. She reports that she does not currently use drugs after having used the following drugs: Marijuana.  Allergies  Allergen Reactions   Meperidine Itching   Hydrochlorothiazide Other (See Comments)    Raises  ammonia level   Keppra [Levetiracetam] Other (See Comments)    Behavioral "makes her angry"    Medications   Current Facility-Administered Medications:    Chlorhexidine Gluconate Cloth 2 % PADS 6 each, 6 each, Topical, Daily, Duayne Cal, NP, 6 each at 11/28/23 0016   docusate sodium (COLACE) capsule 100 mg, 100 mg, Oral, BID PRN, Reita May, Rutwij, MD   enoxaparin (LOVENOX) injection 30 mg, 30 mg, Subcutaneous, Q24H, Joshi, Rutwij, MD   folic acid injection 1 mg, 1 mg, Intravenous, Once, Joshi, Rutwij, MD   ipratropium-albuterol (DUONEB) 0.5-2.5 (3) MG/3ML nebulizer solution 3 mL, 3 mL, Nebulization, Q6H PRN, Reita May, Rutwij, MD   multivitamin liquid 15 mL, 15 mL, Per Tube, Daily, Duayne Cal, NP   polyethylene glycol (MIRALAX / GLYCOLAX) packet 17 g, 17 g, Oral, Daily PRN, Reita May, Rutwij, MD   thiamine (VITAMIN B1) injection 100 mg, 100 mg, Intravenous, Daily, Duayne Cal, NP  Vitals   Vitals:   11/27/23 2230 11/27/23 2245 11/27/23 2255 11/27/23 2315  BP: 103/85 103/83 104/84 93/77  Pulse: (!) 102 (!) 103 (!) 103 (!) 101  Resp: (!) 35 (!) 26 19 (!) 24  Temp:      TempSrc:      SpO2: 100% 100% 100% (!) 89%    There is no height or weight on file to calculate BMI.  Physical Exam   General: Laying comfortably in bed;  in no acute distress.  HENT: Normal oropharynx and mucosa. Normal external appearance of ears and nose.  Neck: Supple, no pain or tenderness  CV: No JVD. No peripheral edema.  Pulmonary: Symmetric Chest rise. Normal respiratory effort.  Abdomen: Soft to touch, non-tender.  Ext: No cyanosis, edema, or deformity  Skin: No rash. Normal palpation of skin.   Musculoskeletal: Normal digits and nails by inspection. No clubbing.   Neurologic Examination  Mental status/Cognition: obtunded no response to voice or loud clap or to noxious stimuli Speech/language: mute, no speech Cranial nerves:   CN II Pupils equal and reactive to light, unable to assess for VF  deficits.   CN III,IV,VI Dolls eye reflex is present   CN V Corneas absent BL   CN VII No facial grimace to noxious stimuli   CN VIII Does not turn head towards speech.   CN IX & X No gag, spontaneous cough   CN XI Head is midline.   CN XII Does not protrude tongue on command.   Motor:  Muscle bulk: Poor, tone flaccid in all extremities No spontaneous movement noted.  No movement to noxious stimuli noted in any of the extremities.  Coordination/Complex Motor:  Unable to assess.  Labs/Imaging/Neurodiagnostic studies   CBC:  Recent Labs  Lab 2023/12/13 2058  WBC 16.1*  16.5*  NEUTROABS 13.8*  HGB 13.8  13.9  HCT 43.6  43.8  MCV 86.0  86.7  PLT 197  186   Basic Metabolic Panel:  Lab Results  Component Value Date   NA 137 13-Dec-2023   K 4.5 Dec 13, 2023   CO2 10 (L) 12-13-2023   GLUCOSE 103 (H) 13-Dec-2023   BUN 17 December 13, 2023   CREATININE 2.46 (H) 13-Dec-2023   CALCIUM 8.5 (L) 12-13-2023   GFRNONAA 23 (L) 12-13-2023   GFRAA >60 11/10/2018   Lipid Panel: No results found for: "LDLCALC" HgbA1c: No results found for: "HGBA1C" Urine Drug Screen:     Component Value Date/Time   LABOPIA NONE DETECTED Dec 13, 2023 2207   COCAINSCRNUR NONE DETECTED Dec 13, 2023 2207   LABBENZ POSITIVE (A) 2023-12-13 2207   AMPHETMU NONE DETECTED 13-Dec-2023 2207   THCU POSITIVE (A) December 13, 2023 2207   LABBARB NONE DETECTED December 13, 2023 2207    Alcohol Level     Component Value Date/Time   ETH <10 2023/12/13 2058   INR  Lab Results  Component Value Date   INR 1.4 (H) 12-13-2023   APTT No results found for: "APTT" AED levels:  Lab Results  Component Value Date   LAMOTRIGINE 8.1 11/06/2022    CT Head without contrast(Personally reviewed): CTH was negative for a large hypodensity concerning for a large territory infarct or hyperdensity concerning for an ICH  CT angio Head and Neck with contrast(Personally reviewed): No basilar thrombosis.  No LVO.  MRI  Brain: Pending.  Neurodiagnostics cEEG:  Pending.  ASSESSMENT   Meredith Park is a 53 y.o. female with hx of alcohol and substance use, Htn, seizures on lamotrigine and topamax who was found seizing for unknown duration.  She was given Versed in the field with resolution of clinical seizure activity noted.  Obtunded mentation for several hours after arrival with no significant improvement.  CT head without contrast with no acute intracranial abnormality.  CT angio of the head and neck with no basilar thrombosis or LVO.  Clinical concern for subclinical seizures. However, she was barely protecting her airway on my evaluation and therefore additional sedating anti seizure medications were held.  RECOMMENDATIONS  -  cEEG - Keppra 2000mg  IV once - Vimpat 200mg  IV once - PCCM team planning intubation. - STAT CT Angio head and neck to evaluate for basilar thrombosis. - agree with empiric meningitis coverage with Vanc, ceftriaxone, Ampicillin and acyclovir. - Aed escalation based on LTM EEG. ______________________________________________________________________  This patient is critically ill and at significant risk of neurological worsening, death and care requires constant monitoring of vital signs, hemodynamics,respiratory and cardiac monitoring, neurological assessment, discussion with family, other specialists and medical decision making of high complexity. I spent 40 minutes of neurocritical care time  in the care of  this patient. This was time spent independent of any time provided by nurse practitioner or PA.  Erick Blinks [facility] 11/28/2023  5:35 AM   Signed, Erick Blinks, MD [facility]

## 2023-11-28 NOTE — Progress Notes (Addendum)
 NAME:  Meredith Park, MRN:  [id number], DOB:  [birth year], LOS: 1 ADMISSION DATE:  11/27/2023, CONSULTATION DATE: 3/25 REFERRING MD: Dr. Charm Barges EDP, CHIEF COMPLAINT: Seizure  History of Present Illness:  53 year old female with past medical history as below, which is significant for seizure disorder followed at [facility] neurology, alcohol abuse reportedly sober for 1 year, hypertension, marijuana use, and hyperlipidemia.  Seizures are managed with Lamictal and recently added topamax with better control of seizures.  Last seen by neurology 07/19/2023.  Family reports she did have a mixup with her seizure medications a week prior to arrival.  She ran out of one of her medications and did not realize it.  This has been remedied and she is back taking both medications.  The patient was in her usual state of health until 3/25 when she was found down beside her bed by her family at approximately 7 PM.  Last known well around 2:30 PM.  Her mother found her and noticed her leg shaking and she was unresponsive.  EMS was called upon EMS arrival she was noted to have continuous full body seizure as well as vomiting.  She was treated with 10 mg of IM Versed with cessation of seizures.  She remained minimally responsive in the emergency department.  She was hypoxic requiring 10 L of oxygen.  Chest x-ray was unremarkable.  CT of the head and C-spine were unremarkable for acute issues with the exception of findings associated with chronic paranasal sinusitis.  Due to poor responsiveness PCCM was asked to evaluate for ICU admission.  Pertinent  Medical History   has a past medical history of Alcohol abuse, Alcohol withdrawal (HCC) (08/28/2018), High cholesterol, Hypertension, and Seizures (HCC).   Significant Hospital Events: Including procedures, antibiotic start and stop dates in addition to other pertinent events   3/25 admit to ICU. Intubated  Interim History / Subjective:  Admitted and intubated  overnight Versed 2 Levo 8  Objective   Blood pressure 93/73, pulse 69, temperature (!) 101.9 F (38.8 C), temperature source Rectal, resp. rate (!) 24, height 5' 2.01" (1.575 m), weight 64.7 kg, last menstrual period 08/28/2020, SpO2 99%.    Vent Mode: PRVC FiO2 (%):  [60 %-100 %] 60 % Set Rate:  [18 bmp-24 bmp] 24 bmp Vt Set:  [440 mL] 440 mL PEEP:  [5 cmH20] 5 cmH20 Plateau Pressure:  [14 cmH20] 14 cmH20   Intake/Output Summary (Last 24 hours) at 11/28/2023 0850 Last data filed at 11/28/2023 0700 Gross per 24 hour  Intake 3852.58 ml  Output 1325 ml  Net 2527.58 ml   Filed Weights   11/28/23 0015 11/28/23 0500  Weight: 63 kg 64.7 kg   Physical Exam: General: Critically ill-appearing, sedated HENT: [place], AT, ETT in place Eyes: EOMI, no scleral icterus Respiratory: Clear to auscultation bilaterally.  No crackles, wheezing or rales Cardiovascular: RRR, -M/R/G, no JVD GI: BS+, soft, nontender Extremities:-Edema,-tenderness Neuro: Sedated, PERRL, CNII-XII grossly intact GU: External foley in place  Imaging, labs and test noted above have been reviewed independently by me.  Resolved Hospital Problem list     Assessment & Plan:   Status epilepticus: Home lamictal, topamax however recently off due to mix-up. CTA neg for LVO. CT spin no acute fx -Neurology consulted. S/p keppra and vimpat once overnight -Scheduled keppra and vimpat per Neuro -Versed gtt -LTM EEG with no definite seizures overnight 3/26 -Empiric meningitic coverage: Vanc/Ceftriaxone/ampicillin/acyclovir -As needed Ativan -Lumbar puncture -Follow cultures  Acute respiratory failure with hypoxia:  Presumably aspiration pneumonia not yet showing on chest XR. PE in differential. Will hold off CTA due to recent contrast and AKI -Full vent support -LTVV, 4-8cc/kg IBW with goal Pplat<30 and DP<15 -F/u doppler legs and check echo  Septic shock secondary aspiration vs CNS infection. Normal LA - ABX as above -  Trend WBC and fever curve.  - Wean levophed for MAP >65  History of hypertension -Holding carvedilol due to borderline blood pressures  AKI. Renal US normal Metabolic acidosis: nongap, lactic 2.7. Osmolar gap 20 - IVF. Will transition from bicarb to LR if CO2 >18 - ABG @12  - F/u volatile panel  Elevated CK - IVF resuscitation  Transaminitis - Hydrate - Trend  History of ETOH abuse - reportedly sober for over a year - Thiamine, folate, MVI   Best Practice (right click and "Reselect all SmartList Selections" daily)   Diet/type: NPO DVT prophylaxis other Hold for LP Pressure ulcer(s): pressure ulcer assessment deferred  GI prophylaxis: H2B Lines: N/A Foley:  N/A Code Status:  full code Last date of multidisciplinary goals of care discussion [ ]  Updated mom 3/26 at bedside  Critical care time: 41 minutes     The patient is critically ill with multiple organ systems failure and requires high complexity decision making for assessment and support, frequent evaluation and titration of therapies, application of advanced monitoring technologies and extensive interpretation of multiple databases.  Independent Critical Care Time: 38 Minutes.   Mechele Collin, M.D. [facility] Pulmonary/Critical Care Medicine 11/28/2023 8:50 AM   Please see Amion for pager number to reach on-call Pulmonary and Critical Care Team.

## 2023-11-28 NOTE — Progress Notes (Signed)
 eLink Physician-Brief Progress Note Patient Name: Meredith Park DOB: 1970-10-19 MRN: [id number]   Date of Service  11/28/2023  HPI/Events of Note  Patient admitted with altered mental status, seizures and acute hypoxemic respiratory failure requiring intubation and mechanical ventilation. Work up is in progress.  eICU Interventions  New Patient Evaluation.        Thomasene Lot Charlie Seda 11/28/2023, 1:46 AM

## 2023-11-28 NOTE — Progress Notes (Signed)
 Initial Nutrition Assessment  DOCUMENTATION CODES:  Not applicable  INTERVENTION:  Initiate tube feeding via OGT: Osmolite 1.5 at 50 ml/h (1200 ml per day) Start at 75mL/h and advance by 10mL q12h to goal Prosource TF20 60 ml 1x/d Provides 1880 kcal, 95 gm protein, 914 ml free water daily Monitor magnesium and phosphorus every 12 hours x 4 occurrences, MD to replete as needed, as pt is at risk for refeeding syndrome given hx of drug and alcohol abuse. MVI, thiamine, and folic acid daily Cortrak to be placed either 3/26 or 3/28 pending availability      NUTRITION DIAGNOSIS:  Inadequate oral intake related to inability to eat as evidenced by NPO status.  GOAL:  Patient will meet greater than or equal to 90% of their needs  MONITOR:  TF tolerance, I & O's, Vent status, Labs  REASON FOR ASSESSMENT:  Ventilator    ASSESSMENT:  Pt with hx of alcohol abuse, drug abuse, HTN, and HLD presented to ED after being found down at home with seizure-like activity.  3/25 - presented to ED, intubated 3/26 - Bedside LP   Patient is currently intubated on ventilator support Pt being cleaned up after bowel movement and rolled onto her side. RN reports that she will be undergoing a bedside LP once pt is cleaned and repositioned. No family present to provide a history. Only able to exam pt's lower half at this time, but severe muscle deficits noted.   Reviewed pt's weight hx. Unsure if accurate but extreme loss of 39% is note in the last 12 months. Pt likely malnourished, but will follow-up with complete physical exam to confirm dx.  Discussed with MD, enteral feeds requested. Will also place cortrak tube as pt would likely benefit from ongoing feeds once extubated.   MV: 10.5 L/min Temp (24hrs), Avg:99 F (37.2 C), Min:97.8 F (36.6 C), Max:101.9 F (38.8 C) MAP (cuff): 96 mmHg  Admit weight: 63 kg  Current weight: 64.7 kg 39% weight loss x 12 months (11/23/22-11/28/23) if accurate which is  severe   Intake/Output Summary (Last 24 hours) at 11/28/2023 1800 Last data filed at 11/28/2023 1700 Gross per 24 hour  Intake 6488.88 ml  Output 1525 ml  Net 4963.88 ml  Net IO Since Admission: 4,963.88 mL [11/28/23 1800]  Drains/Lines: CVC Triple Lumen left IJ OGT (gastric) UOP 1275 mL x 24 hours  Nutritionally Relevant Medications: Scheduled Meds:  docusate  100 mg Per Tube BID   famotidine  20 mg Per Tube Daily   multivitamin with minerals  1 tablet Per Tube Daily   polyethylene glycol  17 g Per Tube Daily   Continuous Infusions:  acyclovir     ampicillin (OMNIPEN) IV 2 g (11/28/23 0918)   cefTRIAXone (ROCEPHIN)  IV Stopped (11/28/23 0200)   norepinephrine (LEVOPHED) Adult infusion 10 mcg/min (11/28/23 0915)   sodium bicarbonate 150 mEq in sterile water 1,150 mL infusion 100 mL/hr at 11/28/23 0915   thiamine (VITAMIN B1) injection Stopped (11/28/23 0407)   PRN Meds: docusate sodium, polyethylene glycol  Labs Reviewed: K 2.8 BUN 27, creatinine 2.8 CBG ranges from 101-103 mg/dL over the last 24 hours  NUTRITION - FOCUSED PHYSICAL EXAM: Unable to assess upper body as pt was turned on her side and was being cleaned from BM. Severe deficits noted to lower half Flowsheet Row Most Recent Value  Patellar Region Severe depletion  Anterior Thigh Region Severe depletion  Posterior Calf Region Moderate depletion    Diet Order:   Diet  Order             Diet NPO time specified  Diet effective now                   EDUCATION NEEDS:  Not appropriate for education at this time  Skin:  Skin Assessment: Reviewed RN Assessment  Last BM:  3/26 - type 6  Height:  Ht Readings from Last 1 Encounters:  11/28/23 5' 2.01" (1.575 m)    Weight:  Wt Readings from Last 1 Encounters:  11/28/23 64.7 kg    Ideal Body Weight:  50 kg  BMI:  Body mass index is 26.08 kg/m.  Estimated Nutritional Needs:  Kcal:  1800-2000 kcal/d Protein:  90-105 g/d Fluid:   1.8-2L/d    Greig Castilla, RD, LDN Registered Dietitian II Please reach out via secure chat

## 2023-11-28 NOTE — Progress Notes (Signed)
 Bilateral lower extremity venous duplex has been completed. Preliminary results can be found in CV Proc through chart review.   11/28/23 9:25 AM Olen Cordial RVT

## 2023-11-28 NOTE — Progress Notes (Signed)
 LTM EEG hooked up and running - no initial skin breakdown - push button tested - [facility] monitoring.

## 2023-11-28 NOTE — Progress Notes (Signed)
 Call placed to [facility] regarding results of volatile alcohol panel. Results read back by technician to this PharmD and faxed by [facility] to 4N unit. Unable to cross over into EHR.   Ethanol <0.010 Methanol <0.010 Isoprop <0.010 Acetone <0.010  Calton Dach, PharmD, BCCCP Clinical Pharmacist 11/28/2023 3:27 PM

## 2023-11-28 NOTE — Progress Notes (Signed)
 Echocardiogram 2D Echocardiogram has been performed.  Warren Lacy Markelle Asaro RDCS 11/28/2023, 10:57 AM

## 2023-11-28 NOTE — Progress Notes (Signed)
 NEUROLOGY CONSULT FOLLOW UP NOTE   Date of service: November 28, 2023 Patient Name: Meredith Park MRN:  [id number] DOB:  1971-07-26  Interval Hx/subjective   Sedation with versed at 2mg ; Tmax 101.54F, CK 16,667, Cr 2.38. AST/ALT 297/185. Ammonia 67 Requiring some vasopressor support still Topirmate level pending  Start keppra and vimpat scheduled today Remains on acyclovir, rocephin, and vancomycin   Current AEDs:  Keppra 500mg  BID Vimpat 200mg  BID  Vitals   Vitals:   11/28/23 0700 11/28/23 0715 11/28/23 0730 11/28/23 0745  BP: 91/76 (!) 88/75 (!) 88/70 93/73  Pulse: 72 71 71 69  Resp: (!) 24 (!) 24 (!) 24 (!) 24  Temp:      TempSrc:      SpO2: 99% 98% 98% 99%  Weight:      Height:         Body mass index is 26.08 kg/m.  Physical Exam   Constitutional: Ill appearing  Eyes: No scleral injection.  HENT: No OP obstrucion. Residual blood in mouth Cardiovascular: Normal rate and regular rhythm.  Respiratory: Mechanically ventilated   Neurologic Examination   Neuro: On Versed at 2mg  Mental Status: Intubated, unable to follow commands  Cranial Nerves: II: Pupils are equal, round, and reactive to light.  Corneal sluggish. Pupils R 16mm>L 3mm, brisk   VIII: No response to verbal stimuli  X: Cough weak, gag absent  WU:JWJX midline  XII: Tongue protrudes midline without atrophy or fasciculations.  Motor/Sensory: No spontaneous movement Cerebellar: Unable to assess  Medications  Current Facility-Administered Medications:    0.9 %  sodium chloride infusion, 250 mL, Intravenous, Continuous, Duayne Cal, NP, Last Rate: 10 mL/hr at 11/28/23 0700, Infusion Verify at 11/28/23 0700   acyclovir (ZOVIRAX) 630 mg in dextrose 5 % 100 mL IVPB, 10 mg/kg, Intravenous, Q24H, Ledford, James L, RPH   ampicillin (OMNIPEN) 2 g in sodium chloride 0.9 % 100 mL IVPB, 2 g, Intravenous, Q8H, Ledford, James L, RPH   cefTRIAXone (ROCEPHIN) 2 g in sodium chloride 0.9 % 100 mL IVPB, 2 g,  Intravenous, Q12H, Stevphen Rochester, RPH, Stopped at 11/28/23 0200   Chlorhexidine Gluconate Cloth 2 % PADS 6 each, 6 each, Topical, Daily, Duayne Cal, NP, 6 each at 11/28/23 0016   docusate (COLACE) 50 MG/5ML liquid 100 mg, 100 mg, Per Tube, BID, Duayne Cal, NP   docusate sodium (COLACE) capsule 100 mg, 100 mg, Oral, BID PRN, Reita May, Rutwij, MD   enoxaparin (LOVENOX) injection 30 mg, 30 mg, Subcutaneous, Q24H, Joshi, Rutwij, MD   famotidine (PEPCID) tablet 20 mg, 20 mg, Per Tube, BID, Duayne Cal, NP   fentaNYL (SUBLIMAZE) injection 50 mcg, 50 mcg, Intravenous, Q15 min PRN, Duayne Cal, NP   fentaNYL (SUBLIMAZE) injection 50-200 mcg, 50-200 mcg, Intravenous, Q30 min PRN, Duayne Cal, NP   ipratropium-albuterol (DUONEB) 0.5-2.5 (3) MG/3ML nebulizer solution 3 mL, 3 mL, Nebulization, Q6H PRN, Reita May, Rutwij, MD   midazolam (VERSED) 100 mg/100 mL (1 mg/mL) premix infusion, 0.5-10 mg/hr, Intravenous, Continuous, Erick Blinks, MD, Last Rate: 2 mL/hr at 11/28/23 0700, 2 mg/hr at 11/28/23 0700   multivitamin liquid 15 mL, 15 mL, Per Tube, Daily, Duayne Cal, NP   norepinephrine (LEVOPHED) 4mg  in (0.016 mg/mL) premix infusion, 2-10 mcg/min, Intravenous, Titrated, Duayne Cal, NP, Last Rate: 30 mL/hr at 11/28/23 0700, 8 mcg/min at 11/28/23 0700   Oral care mouth rinse, 15 mL, Mouth Rinse, Q2H, Duayne Cal, NP, 15 mL at 11/28/23  9147   Oral care mouth rinse, 15 mL, Mouth Rinse, PRN, Duayne Cal, NP   polyethylene glycol (MIRALAX / GLYCOLAX) packet 17 g, 17 g, Oral, Daily PRN, Reita May, Rutwij, MD   polyethylene glycol (MIRALAX / GLYCOLAX) packet 17 g, 17 g, Per Tube, Daily, Duayne Cal, NP   propofol (DIPRIVAN) 1000 MG/100ML infusion, 0-50 mcg/kg/min, Intravenous, Continuous, Duayne Cal, NP, Held at 11/28/23 0250   sodium bicarbonate 150 mEq in sterile water 1,150 mL infusion, , Intravenous, Continuous, Duayne Cal, NP, Last Rate: 100 mL/hr at  11/28/23 0700, Infusion Verify at 11/28/23 0700   thiamine (VITAMIN B1) 500 mg in sodium chloride 0.9 % 50 mL IVPB, 500 mg, Intravenous, Q8H, Paused at 11/28/23 0407 **FOLLOWED BY** [START ON 11/30/2023] thiamine (VITAMIN B1) 250 mg in sodium chloride 0.9 % 50 mL IVPB, 250 mg, Intravenous, Daily **FOLLOWED BY** [START ON 12/06/2023] thiamine (VITAMIN B1) injection 100 mg, 100 mg, Intravenous, Daily, Erick Blinks, MD   [START ON 11/29/2023] vancomycin (VANCOREADY) IVPB 750 mg/150 mL, 750 mg, Intravenous, Q24H, Stevphen Rochester [facility]  Labs and Diagnostic Imaging   CBC:  Recent Labs  Lab 11/27/23 2058 11/28/23 0155  WBC 16.1*  16.5*  --   NEUTROABS 13.8*  --   HGB 13.8  13.9 12.6  HCT 43.6  43.8 37.0  MCV 86.0  86.7  --   PLT 197  186  --     Basic Metabolic Panel:  Lab Results  Component Value Date   NA 142 11/28/2023   K 2.8 (L) 11/28/2023   CO2 10 (L) 11/27/2023   GLUCOSE 103 (H) 11/27/2023   BUN 17 11/27/2023   CREATININE 2.38 (H) 11/28/2023   CALCIUM 8.5 (L) 11/27/2023   GFRNONAA 24 (L) 11/28/2023   GFRAA >60 11/10/2018   Urine Drug Screen:     Component Value Date/Time   LABOPIA NONE DETECTED 11/27/2023 2207   COCAINSCRNUR NONE DETECTED 11/27/2023 2207   LABBENZ POSITIVE (A) 11/27/2023 2207   AMPHETMU NONE DETECTED 11/27/2023 2207   THCU POSITIVE (A) 11/27/2023 2207   LABBARB NONE DETECTED 11/27/2023 2207    Alcohol Level     Component Value Date/Time   ETH <10 11/27/2023 2058   INR  Lab Results  Component Value Date   INR 1.4 (H) 11/27/2023   AED levels:  Lab Results  Component Value Date   LAMOTRIGINE 8.1 11/06/2022   Lab Results  Component Value Date   CKTOTAL 16,667 (H) 11/28/2023    CT Head without contrast(Personally reviewed): No acute intracranial abnormalities. Chronic inflammatory changes suggested in the paranasal sinuses.  CT angio Head and Neck with contrast(Personally reviewed): No large vessel occlusion or proximal  hemodynamically significant stenosis.  cEEG: 11/28/2023 0239 to 11/28/2023 0845   IMPRESSION: This study showed evidence of epileptogenicity and cortical dysfunction arising from right hemisphere, maximal right temporal region with increased risk of seizure recurrence.  No definite seizures were noted  Assessment   Meredith Park is a 53 y.o. female with hx of alcohol and substance use, Htn, seizures on lamotrigine and topamax who was found seizing for unknown duration.  She was given Versed in the field with resolution of clinical seizure activity noted.  Obtunded mentation for several hours after arrival with no significant improvement.  CT head without contrast with no acute intracranial abnormality.  CT angio of the head and neck with no basilar thrombosis or LVO. Currently on versed infusion at 2mg . Tmax 101.70F overnight. CK elevated at  95,621 today. Cr 2.38. AST/ALT 297/185. Ammonia 67  Recommendations  For now substituting home AEDs with the following medications: - Keppra 500mg  BID (not a good long-term option due to reported side effects) - Lacosamide 200mg  BID - Topiramate level and volatile levels pending  - Will plan to restart lamotrigine when oral access obtained, may need to start topiramate more slowly pending level and confirmation of which medication she had run out of - Appreciate CCM plan for lumbar puncture, de-escalate antibiotics if negative for infection per CCM team - Appreciate CCM management of rhabdomyolysis and other comorbidities ______________________________________________________________________   Signed, Elmer Picker, NP [facility]  Attending Neurologist's note:  On my later examination patient remained obtunded, pupils intact, no response to noxious stimulation in all 4 extremities,  Reviewed with NP with NP note as above, recommendations edited for clarity as needed  In brief, seizures likely in the setting of missing one of her  seizure medications.  Given lamotrigine level is therapeutic likely missed topiramate but topiramate level is still pending to confirm.  CSF results not concerning for infection and can de-escalate antibiotics  I personally saw this patient, gathering history, performing a full neurologic examination, reviewing relevant labs, personally reviewing relevant imaging including head CT, and formulated the assessment and plan, adding the note above for completeness and clarity to accurately reflect my thoughts  Brooke Dare MD-PhD [facility] [phone] Available 7 AM to 7 PM, outside these hours please contact Neurologist on call listed on AMION   CRITICAL CARE Performed by: Gordy Councilman   Total critical care time: 35 minutes  Critical care time was exclusive of separately billable procedures and treating other patients.  Critical care was necessary to treat or prevent imminent or life-threatening deterioration.  Critical care was time spent personally by me on the following activities: development of treatment plan with patient and/or surrogate as well as nursing, discussions with consultants, evaluation of patient's response to treatment, examination of patient, obtaining history from patient or surrogate, ordering and performing treatments and interventions, ordering and review of laboratory studies, ordering and review of radiographic studies, pulse oximetry and re-evaluation of patient's condition.

## 2023-11-28 NOTE — Procedures (Signed)
 Intubation Procedure Note  Briggette Najarian  [id number]  02/10/1971  Date:11/28/23  Time:12:45 AM   Provider Performing:Irwin Toran W Mikey Bussing    Procedure: Intubation (31500)  Indication(s) Respiratory Failure  Consent Risks of the procedure as well as the alternatives and risks of each were explained to the patient and/or caregiver.  Consent for the procedure was obtained and is signed in the bedside chart   Anesthesia Etomidate, Versed, and Rocuronium   Time Out Verified patient identification, verified procedure, site/side was marked, verified correct patient position, special equipment/implants available, medications/allergies/relevant history reviewed, required imaging and test results available.   Sterile Technique Usual hand hygeine, masks, and gloves were used   Procedure Description Patient positioned in bed supine.  Sedation given as noted above.  Patient was intubated with endotracheal tube using Glidescope.  View was Grade 1 full glottis .  Some dark blood noted in the airway presumably from seizure related trauma, but no obvious laceration seen. Number of attempts was 1.  Colorimetric CO2 detector was consistent with tracheal placement.   Complications/Tolerance None; patient tolerated the procedure well. Chest X-ray is ordered to verify placement.   EBL Minimal   Specimen(s) None   Joneen Roach, AGACNP-BC  Pulmonary & Critical Care  See Amion for personal pager PCCM on call pager [phone] until 7pm. Please call Elink 7p-7a. [phone]  11/28/2023 12:46 AM

## 2023-11-28 NOTE — Progress Notes (Signed)
   11/28/23 0400  Reflexes  Gag Absent  Cough Absent  R Corneal Impaired  L Corneal Impaired   Neuro MD and CCM notified of findings

## 2023-11-28 NOTE — Procedures (Addendum)
 Patient Name: Zakariah Dejarnette  MRN: [id number]  Epilepsy Attending: Charlsie Quest  Referring Physician/Provider: Erick Blinks, MD  Duration: 11/28/2023 0239 to 11/29/2023 0239  Patient history: 53 y.o. female with hx of alcohol and substance use, Htn, seizures on lamotrigine and topamax who was found seizing for unknown duration. EEG to evaluate for seizure  Level of alertness:  comatose/ lethargic   AEDs during EEG study: LEV, versed  Technical aspects: This EEG study was done with scalp electrodes positioned according to the 10-20 International system of electrode placement. Electrical activity was reviewed with band pass filter of 1-70Hz , sensitivity of 7 uV/mm, display speed of 75mm/sec with a 60Hz  notched filter applied as appropriate. EEG data were recorded continuously and digitally stored.  Video monitoring was available and reviewed as appropriate.  Description: EEG showed continuous generalized and lateralized right hemisphere 3 to 6 Hz theta-delta slowing.  Sharp waves were noted in right hemisphere, maximal right temporal region, quasi periodic at 1 2 5  to 1 Hz, at times rhythmic and lasting 10 to 15 seconds without definite evolution. Hyperventilation and photic stimulation were not performed.     ABNORMALITY -Sharp waves, right hemisphere -Continuous slow, generalized and lateralized right hemisphere  IMPRESSION: This study showed evidence of epileptogenicity and cortical dysfunction arising from right hemisphere, maximal right temporal region with increased risk of seizure recurrence. Additionally there is severe diffuse encephalopathy.  No definite seizures were noted   Emeline Simpson Annabelle Harman

## 2023-11-29 ENCOUNTER — Inpatient Hospital Stay (HOSPITAL_COMMUNITY)

## 2023-11-29 ENCOUNTER — Encounter (HOSPITAL_COMMUNITY)

## 2023-11-29 DIAGNOSIS — I429 Cardiomyopathy, unspecified: Secondary | ICD-10-CM | POA: Diagnosis not present

## 2023-11-29 DIAGNOSIS — R579 Shock, unspecified: Secondary | ICD-10-CM | POA: Diagnosis not present

## 2023-11-29 DIAGNOSIS — R748 Abnormal levels of other serum enzymes: Secondary | ICD-10-CM | POA: Diagnosis not present

## 2023-11-29 DIAGNOSIS — I5021 Acute systolic (congestive) heart failure: Secondary | ICD-10-CM | POA: Diagnosis not present

## 2023-11-29 DIAGNOSIS — R570 Cardiogenic shock: Secondary | ICD-10-CM

## 2023-11-29 DIAGNOSIS — R569 Unspecified convulsions: Secondary | ICD-10-CM | POA: Diagnosis not present

## 2023-11-29 DIAGNOSIS — D689 Coagulation defect, unspecified: Secondary | ICD-10-CM | POA: Diagnosis not present

## 2023-11-29 DIAGNOSIS — J9601 Acute respiratory failure with hypoxia: Secondary | ICD-10-CM | POA: Insufficient documentation

## 2023-11-29 DIAGNOSIS — I214 Non-ST elevation (NSTEMI) myocardial infarction: Secondary | ICD-10-CM | POA: Diagnosis not present

## 2023-11-29 DIAGNOSIS — N179 Acute kidney failure, unspecified: Secondary | ICD-10-CM | POA: Diagnosis not present

## 2023-11-29 DIAGNOSIS — K7200 Acute and subacute hepatic failure without coma: Secondary | ICD-10-CM

## 2023-11-29 DIAGNOSIS — E872 Acidosis, unspecified: Secondary | ICD-10-CM

## 2023-11-29 DIAGNOSIS — G40901 Epilepsy, unspecified, not intractable, with status epilepticus: Secondary | ICD-10-CM | POA: Diagnosis not present

## 2023-11-29 LAB — CBC
HCT: 40.1 % (ref 36.0–46.0)
Hemoglobin: 13 g/dL (ref 12.0–15.0)
MCH: 27 pg (ref 26.0–34.0)
MCHC: 32.4 g/dL (ref 30.0–36.0)
MCV: 83.4 fL (ref 80.0–100.0)
Platelets: 62 10*3/uL — ABNORMAL LOW (ref 150–400)
RBC: 4.81 MIL/uL (ref 3.87–5.11)
RDW: 15.8 % — ABNORMAL HIGH (ref 11.5–15.5)
WBC: 14.7 10*3/uL — ABNORMAL HIGH (ref 4.0–10.5)
nRBC: 0.4 % — ABNORMAL HIGH (ref 0.0–0.2)

## 2023-11-29 LAB — GLUCOSE, CAPILLARY
Glucose-Capillary: 161 mg/dL — ABNORMAL HIGH (ref 70–99)
Glucose-Capillary: 163 mg/dL — ABNORMAL HIGH (ref 70–99)
Glucose-Capillary: 169 mg/dL — ABNORMAL HIGH (ref 70–99)
Glucose-Capillary: 180 mg/dL — ABNORMAL HIGH (ref 70–99)
Glucose-Capillary: 181 mg/dL — ABNORMAL HIGH (ref 70–99)
Glucose-Capillary: 219 mg/dL — ABNORMAL HIGH (ref 70–99)

## 2023-11-29 LAB — BASIC METABOLIC PANEL WITH GFR
Anion gap: 13 (ref 5–15)
BUN: 36 mg/dL — ABNORMAL HIGH (ref 6–20)
CO2: 19 mmol/L — ABNORMAL LOW (ref 22–32)
Calcium: 6.8 mg/dL — ABNORMAL LOW (ref 8.9–10.3)
Chloride: 101 mmol/L (ref 98–111)
Creatinine, Ser: 3.16 mg/dL — ABNORMAL HIGH (ref 0.44–1.00)
GFR, Estimated: 17 mL/min — ABNORMAL LOW (ref >60)
Glucose, Bld: 206 mg/dL — ABNORMAL HIGH (ref 70–99)
Potassium: 3.4 mmol/L — ABNORMAL LOW (ref 3.5–5.1)
Sodium: 133 mmol/L — ABNORMAL LOW (ref 135–145)

## 2023-11-29 LAB — HEPATIC FUNCTION PANEL
ALT: 840 U/L — ABNORMAL HIGH (ref 0–44)
AST: 1349 U/L — ABNORMAL HIGH (ref 15–41)
Albumin: 2.5 g/dL — ABNORMAL LOW (ref 3.5–5.0)
Alkaline Phosphatase: 70 U/L (ref 38–126)
Bilirubin, Direct: 1 mg/dL — ABNORMAL HIGH (ref 0.0–0.2)
Indirect Bilirubin: 0.9 mg/dL (ref 0.3–0.9)
Total Bilirubin: 1.9 mg/dL — ABNORMAL HIGH (ref 0.0–1.2)
Total Protein: 4.2 g/dL — ABNORMAL LOW (ref 6.5–8.1)

## 2023-11-29 LAB — MAGNESIUM
Magnesium: 1.8 mg/dL (ref 1.7–2.4)
Magnesium: 1.9 mg/dL (ref 1.7–2.4)
Magnesium: 2.4 mg/dL (ref 1.7–2.4)

## 2023-11-29 LAB — PHOSPHORUS
Phosphorus: 3.7 mg/dL (ref 2.5–4.6)
Phosphorus: 3.9 mg/dL (ref 2.5–4.6)
Phosphorus: 3.2 mg/dL (ref 2.5–4.6)

## 2023-11-29 LAB — HEPATITIS PANEL, ACUTE
HCV Ab: NONREACTIVE
Hep A IgM: NONREACTIVE
Hep B C IgM: NONREACTIVE
Hepatitis B Surface Ag: NONREACTIVE

## 2023-11-29 LAB — CK: Total CK: 16221 U/L — ABNORMAL HIGH (ref 38–234)

## 2023-11-29 LAB — VANCOMYCIN, RANDOM: Vancomycin Rm: 23 ug/mL

## 2023-11-29 LAB — TROPONIN I (HIGH SENSITIVITY): Troponin I (High Sensitivity): >24000 ng/L (ref <18)

## 2023-11-29 LAB — TOPIRAMATE LEVEL: Topiramate Lvl: 3.9 ug/mL (ref 2.0–25.0)

## 2023-11-29 LAB — COOXEMETRY PANEL
Carboxyhemoglobin: 1.1 % (ref 0.5–1.5)
Methemoglobin: 0.9 % (ref 0.0–1.5)
O2 Saturation: 90.7 %
Total hemoglobin: 12.3 g/dL (ref 12.0–16.0)

## 2023-11-29 LAB — LAMOTRIGINE LEVEL: Lamotrigine Lvl: 5.5 ug/mL (ref 2.0–20.0)

## 2023-11-29 LAB — T4, FREE: Free T4: 0.78 ng/dL (ref 0.61–1.12)

## 2023-11-29 LAB — PROTIME-INR
INR: 2.9 — ABNORMAL HIGH (ref 0.8–1.2)
Prothrombin Time: 30.8 s — ABNORMAL HIGH (ref 11.4–15.2)

## 2023-11-29 LAB — TSH: TSH: 2.521 u[IU]/mL (ref 0.350–4.500)

## 2023-11-29 LAB — HEMOGLOBIN A1C
Hgb A1c MFr Bld: 5 % (ref 4.8–5.6)
Mean Plasma Glucose: 96.8 mg/dL

## 2023-11-29 LAB — LACTIC ACID, PLASMA
Lactic Acid, Venous: 3 mmol/L (ref 0.5–1.9)
Lactic Acid, Venous: 2.3 mmol/L (ref 0.5–1.9)

## 2023-11-29 MED ORDER — POTASSIUM CHLORIDE 10 MEQ/50ML IV SOLN
10.0000 meq | INTRAVENOUS | Status: AC
  Administered 2023-11-29 (×2): 10 meq via INTRAVENOUS
  Filled 2023-11-29 (×2): qty 50

## 2023-11-29 MED ORDER — ONDANSETRON HCL 4 MG/2ML IJ SOLN
INTRAMUSCULAR | Status: AC
Start: 2023-11-29 — End: 2023-11-29
  Administered 2023-11-29: 4 mg
  Filled 2023-11-29: qty 2

## 2023-11-29 MED ORDER — SODIUM CHLORIDE 0.9 % IV SOLN
2.0000 g | INTRAVENOUS | Status: DC
  Administered 2023-11-30 – 2023-12-03 (×4): 2 g via INTRAVENOUS
  Filled 2023-11-29 (×4): qty 20

## 2023-11-29 MED ORDER — INSULIN ASPART 100 UNIT/ML IJ SOLN
0.0000 [IU] | INTRAMUSCULAR | Status: DC
  Administered 2023-11-29: 3 [IU] via SUBCUTANEOUS
  Administered 2023-11-29: 2 [IU] via SUBCUTANEOUS
  Administered 2023-11-30: 1 [IU] via SUBCUTANEOUS
  Administered 2023-11-30: 2 [IU] via SUBCUTANEOUS
  Administered 2023-11-30: 1 [IU] via SUBCUTANEOUS
  Administered 2023-11-30: 2 [IU] via SUBCUTANEOUS
  Administered 2023-11-30 – 2023-12-01 (×3): 1 [IU] via SUBCUTANEOUS
  Administered 2023-12-01: 2 [IU] via SUBCUTANEOUS
  Administered 2023-12-01 – 2023-12-02 (×6): 1 [IU] via SUBCUTANEOUS
  Administered 2023-12-03: 2 [IU] via SUBCUTANEOUS
  Administered 2023-12-03: 1 [IU] via SUBCUTANEOUS
  Administered 2023-12-03 (×2): 2 [IU] via SUBCUTANEOUS
  Administered 2023-12-04 (×4): 1 [IU] via SUBCUTANEOUS
  Administered 2023-12-04 (×2): 2 [IU] via SUBCUTANEOUS
  Administered 2023-12-05: 3 [IU] via SUBCUTANEOUS
  Administered 2023-12-05: 2 [IU] via SUBCUTANEOUS
  Administered 2023-12-05 (×2): 1 [IU] via SUBCUTANEOUS
  Administered 2023-12-05 – 2023-12-06 (×4): 2 [IU] via SUBCUTANEOUS
  Administered 2023-12-06: 1 [IU] via SUBCUTANEOUS
  Administered 2023-12-06 (×2): 2 [IU] via SUBCUTANEOUS
  Administered 2023-12-07 (×3): 1 [IU] via SUBCUTANEOUS
  Administered 2023-12-07 (×2): 2 [IU] via SUBCUTANEOUS
  Administered 2023-12-08: 1 [IU] via SUBCUTANEOUS
  Administered 2023-12-08 (×2): 2 [IU] via SUBCUTANEOUS
  Administered 2023-12-08 (×2): 1 [IU] via SUBCUTANEOUS
  Administered 2023-12-08 – 2023-12-09 (×2): 2 [IU] via SUBCUTANEOUS
  Administered 2023-12-09: 3 [IU] via SUBCUTANEOUS
  Administered 2023-12-09 (×4): 1 [IU] via SUBCUTANEOUS
  Administered 2023-12-10: 2 [IU] via SUBCUTANEOUS
  Administered 2023-12-10 (×4): 3 [IU] via SUBCUTANEOUS
  Administered 2023-12-10: 1 [IU] via SUBCUTANEOUS
  Administered 2023-12-11: 3 [IU] via SUBCUTANEOUS
  Administered 2023-12-11: 2 [IU] via SUBCUTANEOUS
  Administered 2023-12-11: 3 [IU] via SUBCUTANEOUS

## 2023-11-29 MED ORDER — CLONAZEPAM 1 MG PO TABS
2.0000 mg | ORAL_TABLET | Freq: Two times a day (BID) | ORAL | Status: DC
  Administered 2023-11-29 – 2023-11-30 (×4): 2 mg
  Filled 2023-11-29 (×5): qty 2

## 2023-11-29 MED ORDER — LAMOTRIGINE 25 MG PO TABS
250.0000 mg | ORAL_TABLET | Freq: Two times a day (BID) | ORAL | Status: DC
  Administered 2023-11-29: 250 mg
  Filled 2023-11-29: qty 2

## 2023-11-29 MED ORDER — LACTATED RINGERS IV SOLN: INTRAVENOUS | Status: DC

## 2023-11-29 MED ORDER — ACETYLCYSTEINE LOAD VIA INFUSION
150.0000 mg/kg | Freq: Once | INTRAVENOUS | Status: AC
  Administered 2023-11-29: 9600 mg via INTRAVENOUS
  Filled 2023-11-29: qty 315

## 2023-11-29 MED ORDER — LAMOTRIGINE 25 MG PO TABS: 25.0000 mg | ORAL_TABLET | Freq: Two times a day (BID) | ORAL | Status: DC

## 2023-11-29 MED ORDER — SODIUM CHLORIDE 0.9 % IV SOLN
1000.0000 mg | Freq: Once | INTRAVENOUS | Status: AC
  Administered 2023-11-29: 1000 mg via INTRAVENOUS
  Filled 2023-11-29: qty 20

## 2023-11-29 MED ORDER — LACTATED RINGERS IV BOLUS
1000.0000 mL | Freq: Once | INTRAVENOUS | Status: AC
  Administered 2023-11-29: 1000 mL via INTRAVENOUS

## 2023-11-29 MED ORDER — TOPIRAMATE 25 MG PO TABS: 25.0000 mg | ORAL_TABLET | Freq: Every day | ORAL | Status: DC

## 2023-11-29 MED ORDER — DEXTROSE 5 % IV SOLN
12.5000 mg/kg/h | INTRAVENOUS | Status: AC
  Administered 2023-11-29: 12.5 mg/kg/h via INTRAVENOUS
  Filled 2023-11-29: qty 90

## 2023-11-29 MED ORDER — DEXTROSE 5 % IV SOLN
6.2500 mg/kg/h | INTRAVENOUS | Status: AC
  Administered 2023-11-29: 6.25 mg/kg/h via INTRAVENOUS
  Filled 2023-11-29 (×2): qty 90

## 2023-11-29 MED ORDER — MAGNESIUM SULFATE 2 GM/50ML IV SOLN
2.0000 g | Freq: Once | INTRAVENOUS | Status: AC
  Administered 2023-11-29: 2 g via INTRAVENOUS
  Filled 2023-11-29: qty 50

## 2023-11-29 MED ORDER — LAMOTRIGINE 25 MG PO TABS
25.0000 mg | ORAL_TABLET | Freq: Two times a day (BID) | ORAL | Status: DC
  Administered 2023-11-29 – 2023-11-30 (×2): 25 mg
  Filled 2023-11-29 (×2): qty 1

## 2023-11-29 MED ORDER — LAMOTRIGINE 100 MG PO TABS: 200.0000 mg | ORAL_TABLET | Freq: Two times a day (BID) | ORAL | Status: DC

## 2023-11-29 MED ORDER — TOPIRAMATE 25 MG PO TABS
25.0000 mg | ORAL_TABLET | Freq: Every day | ORAL | Status: DC
  Administered 2023-11-29: 25 mg
  Filled 2023-11-29: qty 1

## 2023-11-29 NOTE — Progress Notes (Signed)
 LTM maint complete - no skin breakdown seen   Fp1 Fp2 F7 [facility] monitored, Event button test confirmed by [facility].

## 2023-11-29 NOTE — Procedures (Addendum)
 Patient Name: Meredith Park  MRN: [id number]  Epilepsy Attending: Charlsie Quest  Referring Physician/Provider: Erick Blinks, MD  Duration: 11/29/2023 0239 to 11/30/2023 0239   Patient history: 53 y.o. female with hx of alcohol and substance use, Htn, seizures on lamotrigine and topamax who was found seizing for unknown duration. EEG to evaluate for seizure   Level of alertness:  comatose/ lethargic    AEDs during EEG study: LEV, LTG, TPM, LCM, PHT, clonazepam   Technical aspects: This EEG study was done with scalp electrodes positioned according to the 10-20 International system of electrode placement. Electrical activity was reviewed with band pass filter of 1-70Hz , sensitivity of 7 uV/mm, display speed of 19mm/sec with a 60Hz  notched filter applied as appropriate. EEG data were recorded continuously and digitally stored.  Video monitoring was available and reviewed as appropriate.   Description: EEG showed continuous generalized and lateralized right hemisphere 3 to 6 Hz theta-delta slowing.  Lateralized periodic discharges are noted in right hemisphere every 3 to 5 seconds followed by polymorphic 5 to 9 Hz theta and alpha activity lasting 3 to 4 seconds followed by 1 to 2 seconds of generalized EEG attenuation. Hyperventilation and photic stimulation were not performed.      ABNORMALITY -Lateralized periodic discharges, right hemisphere -Continuous slow, generalized and lateralized right hemisphere   IMPRESSION: This study showed evidence of epileptogenicity and cortical dysfunction arising from right hemisphere, maximal right temporal region.  Due to the lateralized periodic discharges followed by brief attenuation, this EEG pattern is concerning for ictal nature.   Lastly there is severe diffuse encephalopathy.    Meredith Park

## 2023-11-29 NOTE — Progress Notes (Signed)
 NAME:  Meredith Park, MRN:  [id number], DOB:  10-19-1970, LOS: 2 ADMISSION DATE:  11/27/2023, CONSULTATION DATE: 3/25 REFERRING MD: Dr. Charm Barges EDP, CHIEF COMPLAINT: Seizure  History of Present Illness:  53 year old female with past medical history as below, which is significant for seizure disorder followed at [facility] neurology, alcohol abuse reportedly sober for 1 year, hypertension, marijuana use, and hyperlipidemia.  Seizures are managed with Lamictal and recently added topamax with better control of seizures.  Last seen by neurology 07/19/2023.  Family reports she did have a mixup with her seizure medications a week prior to arrival.  She ran out of one of her medications and did not realize it.  This has been remedied and she is back taking both medications.  The patient was in her usual state of health until 3/25 when she was found down beside her bed by her family at approximately 7 PM.  Last known well around 2:30 PM.  Her mother found her and noticed her leg shaking and she was unresponsive.  EMS was called upon EMS arrival she was noted to have continuous full body seizure as well as vomiting.  She was treated with 10 mg of IM Versed with cessation of seizures.  She remained minimally responsive in the emergency department.  She was hypoxic requiring 10 L of oxygen.  Chest x-ray was unremarkable.  CT of the head and C-spine were unremarkable for acute issues with the exception of findings associated with chronic paranasal sinusitis.  Due to poor responsiveness PCCM was asked to evaluate for ICU admission.  Pertinent  Medical History   has a past medical history of Alcohol abuse, Alcohol withdrawal (HCC) (08/28/2018), High cholesterol, Hypertension, and Seizures (HCC).   Significant Hospital Events: Including procedures, antibiotic start and stop dates in addition to other pertinent events   3/25 admit to ICU. Intubated 3/26 LP performed. Increasing pressor requirement  Interim History /  Subjective:  Yesterday with increased pressor requirement. LP obtained Afebrile this AM Objective   Blood pressure (!) 114/96, pulse 68, temperature 98 F (36.7 C), temperature source Axillary, resp. rate (!) 22, height 5' 2.01" (1.575 m), weight 64 kg, last menstrual period 08/28/2020, SpO2 100%.    Vent Mode: PRVC FiO2 (%):  [40 %] 40 % Set Rate:  [24 bmp] 24 bmp Vt Set:  [440 mL] 440 mL PEEP:  [5 cmH20] 5 cmH20 Plateau Pressure:  [11 cmH20-18 cmH20] 11 cmH20   Intake/Output Summary (Last 24 hours) at 11/29/2023 0823 Last data filed at 11/29/2023 0700 Gross per 24 hour  Intake 6549.21 ml  Output 300 ml  Net 6249.21 ml   Filed Weights   11/28/23 0015 11/28/23 0500 11/29/23 0500  Weight: 63 kg 64.7 kg 64 kg  Physical Exam: General: Critically ill-appearing, sedated HENT: [place], AT, ETT in place Eyes: EOMI, no scleral icterus Respiratory: Clear to auscultation bilaterally.  No crackles, wheezing or rales Cardiovascular: RRR, -M/R/G, no JVD GI: BS+, soft, nontender Extremities:-Edema,-tenderness Neuro: Sedated, PERRL, no withdrawal, +cough/gag GU: External foley in place   Imaging, labs and test noted above have been reviewed independently by me. Echo newly depressed EF 40-45% with LV WMA, grade I DD, small pericardial effusion without tamponade INR 2.9, yesterdat with increased AST/ALT  Resolved Hospital Problem list     Assessment & Plan:   Status epilepticus: Home lamictal, topamax however recently off due to mix-up. CTA neg for LVO. CT spin no acute fx -Neurology consulted. S/p keppra and vimpat once overnight -Scheduled keppra  and vimpat per Neuro -Weaned off Versed -LTM EEG with no definite seizures overnight 3/26 -Lumbar puncture with neg culture to date. Meningitis/encephalitis panel neg -DC acyclovir -Empiric meningitic coverage: Vanc/Ceftriaxone/ampicillin -Follow final cultures  Acute respiratory failure with hypoxia:  Presumably aspiration pneumonia not  yet showing on chest XR. PE in differential. Will hold off CTA due to recent contrast and AKI. LE dopplers neg for DVT -Full vent support -LTVV, 4-8cc/kg IBW with goal Pplat<30 and DP<15 -VAP and PAD protocol for RASS goal -1 -SBT/WUA when able. Extubation precluded due to critical illness and pressor requirement  Septic shock +/- cardiogenic shock secondary aspiration vs CNS infection. Normal LA Echo with newly depressed EF and LV WMA - ABX as above - Trend WBC and fever curve.  - Wean levophed and vasopressin for MAP >65 - Trend CVP, co-ox - S/p aggressive fluid resuscitation. 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 Consider inotropic support -Cardiology consulted  AKI - worsening. Cardiorenal? Renal US normal Metabolic acidosis: resolving. nongap, lactic 2.7. Osmolar gap 20 - Maintain renal perfusion - Trend UOP/Cr - F/u volatile panel  Elevated CK - IVF bolus PRN  Transaminitis Elevated INR - [place] RUQ limited - Trend  History of ETOH abuse - reportedly sober for over a year - Thiamine, folate, MVI   Best Practice (right click and "Reselect all SmartList Selections" daily)   Diet/type: tubefeeds DVT prophylaxis other elevated INR Pressure ulcer(s): pressure ulcer assessment deferred  GI prophylaxis: H2B Lines: Central line Foley:  N/A Code Status:  full code Last date of multidisciplinary goals of care discussion [ ]    Critical care time: 35 minutes    The patient is critically ill with multiple organ systems failure and requires high complexity decision making for assessment and support, frequent evaluation and titration of therapies, application of advanced monitoring technologies and extensive interpretation of multiple databases.  Independent Critical Care Time: 35  Minutes.   Mechele Collin, M.D. [facility] Pulmonary/Critical Care Medicine 11/29/2023 8:24 AM   Please see Amion for pager number to reach on-call Pulmonary and Critical Care Team.

## 2023-11-29 NOTE — TOC CM/SW Note (Signed)
 Transition of Care [facility]) - Inpatient Brief Assessment   Patient Details  Name: Meredith Park MRN: [id number] Date of Birth: 1971-01-20  Transition of Care [facility]) CM/SW Contact:    Glennon Mac, RN Phone Number: 11/29/2023, 5:22 PM   Clinical Narrative: Patient admitted on 11/27/2023 after being found seizing for unknown duration. PTA, pt independent and living at home with family.   TOC Team will continue to follow; please consult as any needs arise.    Transition of Care Asessment: Insurance and Status: Insurance coverage has been reviewed Patient has primary care physician: Yes [facility]) Home environment has been reviewed: Lives with family Prior level of function:: Independent Prior/Current Home Services: No current home services Social Drivers of Health Review: SDOH reviewed no interventions necessary Readmission risk has been reviewed: Yes Transition of care needs: transition of care needs identified, TOC will continue to follow  Quintella Baton, RN, BSN  Trauma/Neuro ICU Case Manager [phone]

## 2023-11-29 NOTE — Consult Note (Addendum)
 Cardiology Consultation   Patient ID: Meredith Park MRN: [id number]; DOB: 1970-10-18  Admit date: 11/27/2023 Date of Consult: 11/29/2023  PCP:  Meredith Park   [facility] Providers Cardiologist:  New    Patient Profile:   Meredith Park is a 52 y.o. female with a hx of seizure, alcohol abuse, hypertension, marijuana use, hyperlipidemia who is being seen 11/29/2023 for the evaluation of abnormal echo at the request of Dr. Everardo All.  History of Present Illness:   Meredith Park is a 53 year old female with past medical history noted above.  Does not appear that she is ever been evaluated by cardiology in the past. Information obtained from the chart she is intubated and sedated.   Has a history of seizures which are managed with Lamictal and recently started on Topamax.  She is followed by [facility] neurology and last seen in the office 07/2023.  She was brought in by EMS on 3/25 after reportedly complaining of a headache and later being found unresponsive on the floor lying between the bed and the nightstand with emesis and active seizure activity.  In route she was given 10 mg of midazolam with improvement in seizure activity, arrived on nonrebreather.  In the ED her labs showed sodium 137, potassium 4.5, creatinine 2.46, AST 279, ALT 185, lactic acid 2.7, WBC 16.1, Hgb 13.8, CK total 16,667. EKG showed sinus tachycardia, 122 bpm, inferior lateral T wave inversion with slight ST depression in V3 V4.  CT head and neck negative for acute finding.  Ultimately required intubation for airway protection while in the ED.  Seen by neurology and given Keppra, Vimpat with plans for EEG.  She was started on empiric antibiotics for potential meningitis with vancomycin, ceftriaxone, ampicillin and acyclovir.  Underwent lumbar puncture with negative cultures to date.   Echocardiogram 3/26 with LVEF of 40 to 45%, grade 1 diastolic dysfunction, small pericardial effusion, basal  anteroseptal, inferolateral hypokinesis.  On interview she is intubated on precedex, levo, vasopressin with EEG being preformed. No significant response to noxious stimuli. No family at the bedside.   Past Medical History:  Diagnosis Date   Alcohol abuse    Alcohol withdrawal (HCC) 08/28/2018   High cholesterol    Hypertension    Seizures (HCC)     No past surgical history on file.   Inpatient Medications: Scheduled Meds:  Chlorhexidine Gluconate Cloth  6 each Topical Daily   clonazePAM  0.5 mg Per Tube BID   docusate  100 mg Per Tube BID   famotidine  20 mg Per Tube Daily   feeding supplement (PROSource TF20)  60 mL Per Tube Daily   folic acid  1 mg Per Tube Daily   lamoTRIgine  250 mg Per Tube BID   multivitamin with minerals  1 tablet Per Tube Daily   mouth rinse  15 mL Mouth Rinse Q2H   polyethylene glycol  17 g Per Tube Daily   [START ON 12/06/2023] thiamine (VITAMIN B1) injection  100 mg Intravenous Daily   topiramate  25 mg Per Tube QHS   Continuous Infusions:  ampicillin (OMNIPEN) IV Stopped (11/29/23 0557)   cefTRIAXone (ROCEPHIN)  IV Stopped (11/28/23 2228)   dexmedetomidine (PRECEDEX) IV infusion 0.4 mcg/kg/hr (11/29/23 0800)   feeding supplement (OSMOLITE 1.5 CAL) 30 mL/hr at 11/29/23 0800   lacosamide (VIMPAT) IV Stopped (11/29/23 0004)   levETIRAcetam 500 mg (11/29/23 0918)   norepinephrine (LEVOPHED) Adult infusion 17 mcg/min (11/29/23 0800)   thiamine (VITAMIN B1)  injection Stopped (11/29/23 0981)   Followed by   Melene Muller ON 11/30/2023] thiamine (VITAMIN B1) injection     vancomycin 750 mg (11/29/23 0926)   vasopressin 0.03 Units/min (11/29/23 0800)   PRN Meds: acetaminophen, docusate sodium, fentaNYL (SUBLIMAZE) injection, fentaNYL (SUBLIMAZE) injection, ipratropium-albuterol, mouth rinse, polyethylene glycol  Allergies:    Allergies  Allergen Reactions   Meperidine Itching   Hydrochlorothiazide Other (See Comments)    Raises ammonia level   Keppra  [Levetiracetam] Other (See Comments)    Behavioral, suicidal ideation     Social History:   Social History   Socioeconomic History   Marital status: Divorced    Spouse name: Not on file   Number of children: Not on file   Years of education: Not on file   Highest education level: Not on file  Occupational History   Not on file  Tobacco Use   Smoking status: Never   Smokeless tobacco: Never  Vaping Use   Vaping status: Some Days  Substance and Sexual Activity   Alcohol use: Yes    Comment: daily, 1/2 gallon a day x 3 days after relapse, 4 years sober prior to that   Drug use: Not Currently    Types: Marijuana   Sexual activity: Not on file  Other Topics Concern   Not on file  Social History Narrative   Not on file   Social Drivers of Health   Financial Resource Strain: Not on file  Food Insecurity: Low Risk  (07/18/2023)   Received from [facility]   Hunger Vital Sign    Worried About Running Out of Food in the Last Year: Never true    Ran Out of Food in the Last Year: Never true  Transportation Needs: No Transportation Needs (07/18/2023)   Received from [employer]    In the past 12 months, has lack of reliable transportation kept you from medical appointments, meetings, work or from getting things needed for daily living? : No  Physical Activity: Not on file  Stress: Not on file  Social Connections: Not on file  Intimate Partner Violence: Not on file    Family History:    Family History  Problem Relation Age of Onset   Sleep apnea Neg Hx      ROS:  Please see the history of present illness.   All other ROS reviewed and negative.     Physical Exam/Data:   Vitals:   11/29/23 0645 11/29/23 0700 11/29/23 0715 11/29/23 0800  BP: (!) 106/44 (!) 118/94 (!) 114/96 (!) 130/102  Pulse: 71 70 68 65  Resp: (!) 24 (!) 24 (!) 22 (!) 21  Temp:    98 F (36.7 C)  TempSrc:    Axillary  SpO2: 100% 100% 100% 100%  Weight:      Height:         Intake/Output Summary (Last 24 hours) at 11/29/2023 0932 Last data filed at 11/29/2023 0800 Gross per 24 hour  Intake 6457.02 ml  Output 300 ml  Net 6157.02 ml      11/29/2023    5:00 AM 11/28/2023    5:00 AM 11/28/2023   12:15 AM  Last 3 Weights  Weight (lbs) 141 lb 1.5 oz 142 lb 10.2 oz 138 lb 14.2 oz  Weight (kg) 64 kg 64.7 kg 63 kg     Body mass index is 25.8 kg/m.  General: Intubated, sedated with no significant response to noxious stimuli HEENT: normal Neck: no JVD Vascular:  No carotid bruits; Distal pulses 2+ bilaterally Cardiac:  normal S1, S2; RRR; no murmur  Lungs:  course anteriorly Abd: soft, no masses Ext: no edema Musculoskeletal:  No deformities Skin: warm and dry, good distal pulses  Neuro:  sedated  EKG:  The EKG was personally reviewed and demonstrates:  sinus tachycardia, 122 bpm, inferior lateral T wave inversion with slight ST depression in V3 V4 Telemetry:  Telemetry was personally reviewed and demonstrates:  Sinus Rhythm, peaked T waves   Relevant CV Studies:  Echo: 11/28/2023  IMPRESSIONS     1. Left ventricular ejection fraction, by estimation, is 40 to 45%. The  left ventricle has mildly decreased function. The left ventricle  demonstrates regional wall motion abnormalities (see scoring  diagram/findings for description). Left ventricular  diastolic parameters are consistent with Grade I diastolic dysfunction  (impaired relaxation).   2. Right ventricular systolic function is normal. The right ventricular  size is normal.   3. A small pericardial effusion is present. The pericardial effusion is  circumferential. There is no evidence of cardiac tamponade.   4. The mitral valve is normal in structure. Trivial mitral valve  regurgitation.   5. The aortic valve is tricuspid. Aortic valve regurgitation is not  visualized.   FINDINGS   Left Ventricle: Left ventricular ejection fraction, by estimation, is 40  to 45%. The left ventricle has  mildly decreased function. The left  ventricle demonstrates regional wall motion abnormalities. The left  ventricular internal cavity size was normal  in size. There is no left ventricular hypertrophy. Left ventricular  diastolic parameters are consistent with Grade I diastolic dysfunction  (impaired relaxation).     LV Wall Scoring:  The basal anteroseptal segment, basal inferolateral segment, basal  anterolateral segment, basal anterior segment, basal inferior segment, and  basal inferoseptal segment are hypokinetic.   Right Ventricle: The right ventricular size is normal. No increase in  right ventricular wall thickness. Right ventricular systolic function is  normal.   Left Atrium: Left atrial size was normal in size.   Right Atrium: Right atrial size was normal in size.   Pericardium: A small pericardial effusion is present. The pericardial  effusion is circumferential. There is no evidence of cardiac tamponade.   Mitral Valve: The mitral valve is normal in structure. Trivial mitral  valve regurgitation.   Tricuspid Valve: The tricuspid valve is normal in structure. Tricuspid  valve regurgitation is mild.   Aortic Valve: The aortic valve is tricuspid. Aortic valve regurgitation is  not visualized.   Pulmonic Valve: The pulmonic valve was normal in structure. Pulmonic valve  regurgitation is trivial.   Aorta: The aortic root is normal in size and structure and the ascending  aorta was not well visualized.   Venous: IVC assessment for right atrial pressure unable to be performed  due to mechanical ventilation.   IAS/Shunts: No atrial level shunt detected by color flow Doppler.    Laboratory Data:  High Sensitivity Troponin:  No results for input(s): "TROPONINIHS" in the last 720 hours.   Chemistry Recent Labs  Lab 11/28/23 0454 11/28/23 0939 11/28/23 1148 11/28/23 2236 11/29/23 0417  NA  --  141 140 134* 133*  K  --  2.6* 2.8* 3.5 3.4*  CL  --  109  --   103 101  CO2  --  17*  --  19* 19*  GLUCOSE  --  103*  --  189* 206*  BUN  --  27*  --  35* 36*  CREATININE 2.38* 2.80*  --  3.06* 3.16*  CALCIUM  --  7.8*  --  6.7* 6.8*  MG 2.1  --   --  1.8 1.9  GFRNONAA 24* 20*  --  18* 17*  ANIONGAP  --  15  --  12 13    Recent Labs  Lab 11/27/23 2058 11/28/23 0939  PROT 6.2* 4.8*  ALBUMIN 4.0 3.1*  AST 297* 652*  ALT 185* 279*  ALKPHOS 87 74  BILITOT 1.0 1.5*   Lipids No results for input(s): "CHOL", "TRIG", "HDL", "LABVLDL", "LDLCALC", "CHOLHDL" in the last 168 hours.  Hematology Recent Labs  Lab 11/27/23 2058 11/28/23 0155 11/28/23 0939 11/28/23 1148 11/29/23 0417  WBC 16.1*  16.5*  --  15.6*  --  14.7*  RBC 5.07  5.05  --  5.10  --  4.81  HGB 13.8  13.9   < > 14.1 13.9 13.0  HCT 43.6  43.8   < > 43.4 41.0 40.1  MCV 86.0  86.7  --  85.1  --  83.4  MCH 27.2  27.5  --  27.6  --  27.0  MCHC 31.7  31.7  --  32.5  --  32.4  RDW 15.3  15.2  --  15.5  --  15.8*  PLT 197  186  --  93*  --  62*   < > = values in this interval not displayed.   Thyroid  Recent Labs  Lab 11/28/23 2236  TSH 2.521  FREET4 0.78    BNPNo results for input(s): "BNP", "PROBNP" in the last 168 hours.  DDimer No results for input(s): "DDIMER" in the last 168 hours.   Radiology/Studies:  VAS [place] LOWER EXTREMITY VENOUS (DVT) Result Date: 11/28/2023  Lower Venous DVT Study Patient Name:  ALECHIA LEZAMA  Date of Exam:   11/28/2023 Medical Rec #: [id number]            Accession #:    [id number] Date of Birth: [birth year]             Patient Gender: F Patient Age:   26 years Exam Location:  [facility] Procedure:      VAS [place] LOWER EXTREMITY VENOUS (DVT) Referring Phys: Renae Fickle HOFFMAN --------------------------------------------------------------------------------  Indications: Hypoxia.  Risk Factors: None identified. Comparison Study: No prior studies. Performing Technologist: Chanda Busing RVT  Examination Guidelines: A complete evaluation  includes B-mode imaging, spectral Doppler, color Doppler, and power Doppler as needed of all accessible portions of each vessel. Bilateral testing is considered an integral part of a complete examination. Limited examinations for reoccurring indications may be performed as noted. The reflux portion of the exam is performed with the patient in reverse Trendelenburg.  +---------+---------------+---------+-----------+----------+--------------+ RIGHT    CompressibilityPhasicitySpontaneityPropertiesThrombus Aging +---------+---------------+---------+-----------+----------+--------------+ CFV      Full           Yes      Yes                                 +---------+---------------+---------+-----------+----------+--------------+ SFJ      Full                                                        +---------+---------------+---------+-----------+----------+--------------+ FV Prox  Full                                                        +---------+---------------+---------+-----------+----------+--------------+  FV Mid   Full                                                        +---------+---------------+---------+-----------+----------+--------------+ FV DistalFull                                                        +---------+---------------+---------+-----------+----------+--------------+ PFV      Full                                                        +---------+---------------+---------+-----------+----------+--------------+ POP      Full           Yes      Yes                                 +---------+---------------+---------+-----------+----------+--------------+ PTV      Full                                                        +---------+---------------+---------+-----------+----------+--------------+ PERO     Full                                                         +---------+---------------+---------+-----------+----------+--------------+   +---------+---------------+---------+-----------+----------+--------------+ LEFT     CompressibilityPhasicitySpontaneityPropertiesThrombus Aging +---------+---------------+---------+-----------+----------+--------------+ CFV      Full           Yes      Yes                                 +---------+---------------+---------+-----------+----------+--------------+ SFJ      Full                                                        +---------+---------------+---------+-----------+----------+--------------+ FV Prox  Full                                                        +---------+---------------+---------+-----------+----------+--------------+ FV Mid   Full                                                        +---------+---------------+---------+-----------+----------+--------------+  FV DistalFull                                                        +---------+---------------+---------+-----------+----------+--------------+ PFV      Full                                                        +---------+---------------+---------+-----------+----------+--------------+ POP      Full           Yes      Yes                                 +---------+---------------+---------+-----------+----------+--------------+ PTV      Full                                                        +---------+---------------+---------+-----------+----------+--------------+ PERO     Full                                                        +---------+---------------+---------+-----------+----------+--------------+     Summary: RIGHT: - There is no evidence of deep vein thrombosis in the lower extremity.  - No cystic structure found in the popliteal fossa.  LEFT: - There is no evidence of deep vein thrombosis in the lower extremity.  - No cystic structure found in the popliteal fossa.   *See table(s) above for measurements and observations. Electronically signed by Coral Else MD on 11/28/2023 at 10:01:08 PM.    Final    DG CHEST PORT 1 VIEW Result Date: 11/28/2023 CLINICAL DATA:  [id number] Encounter for central line placement [id number] EXAM: PORTABLE CHEST 1 VIEW COMPARISON:  11/28/2023 FINDINGS: Endotracheal tube is 3.9 cm above the carina. Nasogastric tube is in the left upper abdomen and probably in the proximal stomach. Left subclavian central line tip in the upper SVC. Negative for a pneumothorax. Both lungs are clear. Heart and mediastinum are within normal limits. IMPRESSION: 1. Left subclavian central line tip is in the upper SVC. Negative for a pneumothorax. 2. Endotracheal tube is in good position. 3. No focal lung disease. Electronically Signed   By: Richarda Overlie M.D.   On: 11/28/2023 15:04   ECHOCARDIOGRAM COMPLETE Result Date: 11/28/2023    ECHOCARDIOGRAM REPORT   Patient Name:   MATTESON BLUE Date of Exam: 11/28/2023 Medical Rec #:  [id number]           Height:       62.0 in Accession #:    [id number]          Weight:       142.6 lb Date of Birth:  1970-12-31            BSA:          1.656 m  Patient Age:    53 years            BP:           85/67 mmHg Patient Gender: F                   HR:           65 bpm. Exam Location:  Inpatient Procedure: 2D Echo, Color Doppler and Cardiac Doppler (Both Spectral and Color            Flow Doppler were utilized during procedure). Indications:    R06.9 DOE  History:        Patient has no prior history of Echocardiogram examinations.                 Risk Factors:Hypertension and Dyslipidemia.  Sonographer:    Irving Burton Senior RDCS Referring Phys: [phone] Clarene Critchley [facility]  Sonographer Comments: Scanned supine on artificial respirator IMPRESSIONS  1. Left ventricular ejection fraction, by estimation, is 40 to 45%. The left ventricle has mildly decreased function. The left ventricle demonstrates regional wall motion abnormalities (see scoring  diagram/findings for description). Left ventricular diastolic parameters are consistent with Grade I diastolic dysfunction (impaired relaxation).  2. Right ventricular systolic function is normal. The right ventricular size is normal.  3. A small pericardial effusion is present. The pericardial effusion is circumferential. There is no evidence of cardiac tamponade.  4. The mitral valve is normal in structure. Trivial mitral valve regurgitation.  5. The aortic valve is tricuspid. Aortic valve regurgitation is not visualized. FINDINGS  Left Ventricle: Left ventricular ejection fraction, by estimation, is 40 to 45%. The left ventricle has mildly decreased function. The left ventricle demonstrates regional wall motion abnormalities. The left ventricular internal cavity size was normal in size. There is no left ventricular hypertrophy. Left ventricular diastolic parameters are consistent with Grade I diastolic dysfunction (impaired relaxation).  LV Wall Scoring: The basal anteroseptal segment, basal inferolateral segment, basal anterolateral segment, basal anterior segment, basal inferior segment, and basal inferoseptal segment are hypokinetic. Right Ventricle: The right ventricular size is normal. No increase in right ventricular wall thickness. Right ventricular systolic function is normal. Left Atrium: Left atrial size was normal in size. Right Atrium: Right atrial size was normal in size. Pericardium: A small pericardial effusion is present. The pericardial effusion is circumferential. There is no evidence of cardiac tamponade. Mitral Valve: The mitral valve is normal in structure. Trivial mitral valve regurgitation. Tricuspid Valve: The tricuspid valve is normal in structure. Tricuspid valve regurgitation is mild. Aortic Valve: The aortic valve is tricuspid. Aortic valve regurgitation is not visualized. Pulmonic Valve: The pulmonic valve was normal in structure. Pulmonic valve regurgitation is trivial. Aorta: The  aortic root is normal in size and structure and the ascending aorta was not well visualized. Venous: IVC assessment for right atrial pressure unable to be performed due to mechanical ventilation. IAS/Shunts: No atrial level shunt detected by color flow Doppler.  LEFT VENTRICLE PLAX 2D LVIDd:         4.40 cm     Diastology LVIDs:         3.90 cm     LV e' medial:    4.57 cm/s LV PW:         1.00 cm     LV E/e' medial:  12.5 LV IVS:        0.60 cm     LV e' lateral:   5.33 cm/s LVOT diam:  2.00 cm     LV E/e' lateral: 10.7 LV SV:         55 LV SV Index:   33 LVOT Area:     3.14 cm  LV Volumes (MOD) LV vol d, MOD A2C: 49.9 ml LV vol d, MOD A4C: 64.3 ml LV vol s, MOD A2C: 26.2 ml LV vol s, MOD A4C: 30.4 ml LV SV MOD A2C:     23.7 ml LV SV MOD A4C:     64.3 ml LV SV MOD BP:      26.8 ml RIGHT VENTRICLE RV S prime:     9.79 cm/s TAPSE (M-mode): 1.9 cm LEFT ATRIUM             Index        RIGHT ATRIUM           Index LA diam:        2.80 cm 1.69 cm/m   RA Area:     13.30 cm LA Vol (A2C):   36.0 ml 21.74 ml/m  RA Volume:   34.10 ml  20.59 ml/m LA Vol (A4C):   34.5 ml 20.83 ml/m LA Biplane Vol: 36.5 ml 22.04 ml/m  AORTIC VALVE LVOT Vmax:   92.00 cm/s LVOT Vmean:  63.900 cm/s LVOT VTI:    0.174 m  AORTA Ao Root diam: 3.00 cm MITRAL VALVE               TRICUSPID VALVE MV Area (PHT): 3.91 cm    TR Peak grad:   17.6 mmHg MV Decel Time: 194 msec    TR Vmax:        210.00 cm/s MV E velocity: 56.90 cm/s MV A velocity: 66.30 cm/s  SHUNTS MV E/A ratio:  0.86        Systemic VTI:  0.17 m                            Systemic Diam: 2.00 cm Donato Schultz MD Electronically signed by Donato Schultz MD Signature Date/Time: 11/28/2023/2:01:15 PM    Final    Overnight EEG with video Result Date: 11/28/2023 Charlsie Quest, MD     11/28/2023  8:55 AM Patient Name: Artie Takayama MRN: [id number] Epilepsy Attending: Charlsie Quest Referring Physician/Provider: Erick Blinks, MD Duration: 11/28/2023 0239 to 11/28/2023 0845  Patient history: 53 y.o. female with hx of alcohol and substance use, Htn, seizures on lamotrigine and topamax who was found seizing for unknown duration. EEG to evaluate for seizure Level of alertness:  comatose/ lethargic AEDs during EEG study: LEV, versed Technical aspects: This EEG study was done with scalp electrodes positioned according to the 10-20 International system of electrode placement. Electrical activity was reviewed with band pass filter of 1-70Hz , sensitivity of 7 uV/mm, display speed of 5mm/sec with a 60Hz  notched filter applied as appropriate. EEG data were recorded continuously and digitally stored.  Video monitoring was available and reviewed as appropriate. Description: EEG showed continuous generalized and lateralized right hemisphere 3 to 6 Hz theta-delta slowing.  Sharp waves were noted in right hemisphere, maximal right temporal region, quasi periodic at 1 2 5  to 1 Hz, at times rhythmic and lasting 10 to 15 seconds without definite evolution. Hyperventilation and photic stimulation were not performed.   ABNORMALITY -Sharp waves, right hemisphere -Continuous slow, generalized and lateralized right hemisphere IMPRESSION: This study showed evidence of epileptogenicity and cortical dysfunction arising from right hemisphere, maximal right temporal  region with increased risk of seizure recurrence.  No definite seizures were noted Priyanka Annabelle Harman   US RENAL Result Date: 11/28/2023 CLINICAL DATA:  Acute kidney injury EXAM: RENAL / URINARY TRACT ULTRASOUND COMPLETE COMPARISON:  None Available. FINDINGS: Right Kidney: Renal measurements: 13 x 6.2 x 4.8 cm = volume: 202 mL. Echogenicity within normal limits. No mass or hydronephrosis visualized. Left Kidney: Renal measurements: 12.3 x 6.3 x 5.2 cm = volume: 209 mL. Echogenicity within normal limits. No mass or hydronephrosis visualized. Bladder: Appears normal for degree of bladder distention. Other: None. IMPRESSION: Normal ultrasound appearance  of the kidneys. No solid mass or hydronephrosis. Electronically Signed   By: Burman Nieves M.D.   On: 11/28/2023 03:14   CT ANGIO HEAD NECK W WO CM Result Date: 11/28/2023 CLINICAL DATA:  Neuro deficit, acute, stroke suspected obtunded mentation, rule out basilar thrombosis EXAM: CT ANGIOGRAPHY HEAD AND NECK WITH AND WITHOUT CONTRAST TECHNIQUE: Multidetector CT imaging of the head and neck was performed using the standard protocol during bolus administration of intravenous contrast. Multiplanar CT image reconstructions and MIPs were obtained to evaluate the vascular anatomy. Carotid stenosis measurements (when applicable) are obtained utilizing NASCET criteria, using the distal internal carotid diameter as the denominator. RADIATION DOSE REDUCTION: This exam was performed according to the departmental dose-optimization program which includes automated exposure control, adjustment of the mA and/or kV according to patient size and/or use of iterative reconstruction technique. CONTRAST:  75mL OMNIPAQUE IOHEXOL 350 MG/ML SOLN COMPARISON:  March 25, 25 CT head. FINDINGS: CTA NECK FINDINGS Aortic arch: Great vessel origins are patent without significant stenosis. Right carotid system: No evidence of dissection, stenosis (50% or greater), or occlusion. Left carotid system: No evidence of dissection, stenosis (50% or greater), or occlusion. Vertebral arteries: Codominant. No evidence of dissection, stenosis (50% or greater), or occlusion. Skeleton: No acute abnormality. Other neck: No acute abnormality on limited assessment. Upper chest: Visualized lung apices are clear. Review of the MIP images confirms the above findings CTA HEAD FINDINGS Anterior circulation: Bilateral intracranial ICAs, MCAs, and ACAs are patent without proximal hemodynamically significant stenosis. Azygos ACA, anatomic variant. Posterior circulation: Bilateral intradural vertebral arteries, basilar artery and bilateral posterior cerebral arteries  are patent without proximal hemodynamically significant stenosis. Venous sinuses: As permitted by contrast timing, patent. Review of the MIP images confirms the above findings IMPRESSION: No large vessel occlusion or proximal hemodynamically significant stenosis. Electronically Signed   By: Feliberto Harts M.D.   On: 11/28/2023 01:48   Portable Chest x-ray Result Date: 11/28/2023 CLINICAL DATA:  Check endotracheal tube and gastric catheter placement EXAM: PORTABLE CHEST 1 VIEW COMPARISON:  Film from the previous day. FINDINGS: Cardiac shadow is stable. Endotracheal tube is noted with the tip directed into the mainstem bronchus. This should be withdrawn approximately 3 cm. Gastric catheter extends into the stomach although the proximal side port is noted in the distal esophagus. This could also be advanced deeper into the stomach. Lungs are clear. IMPRESSION: Tubes and lines as described above. Gastric catheter should be advanced deeper into the stomach in the endotracheal tube should be withdrawn as described. These results will be called to the ordering clinician or representative by the Radiologist Assistant, and communication documented in the PACS or [employer]. Electronically Signed   By: Alcide Clever M.D.   On: 11/28/2023 01:05   CT Cervical Spine Wo Contrast Result Date: 11/27/2023 CLINICAL DATA:  Neck trauma. Intoxicated or obtunded. Seizures with a fall. EXAM: CT CERVICAL SPINE WITHOUT  CONTRAST TECHNIQUE: Multidetector CT imaging of the cervical spine was performed without intravenous contrast. Multiplanar CT image reconstructions were also generated. RADIATION DOSE REDUCTION: This exam was performed according to the departmental dose-optimization program which includes automated exposure control, adjustment of the mA and/or kV according to patient size and/or use of iterative reconstruction technique. COMPARISON:  08/05/2021 FINDINGS: Alignment: Normal alignment. Skull base and vertebrae:  Skull base appears intact. No vertebral compression deformities. No focal bone lesion or bone destruction. Bone cortex appears intact. Soft tissues and spinal canal: No prevertebral soft tissue swelling. No abnormal paraspinal soft tissue mass or infiltration. Disc levels: Degenerative changes with disc space narrowing and endplate osteophyte formation throughout. Changes are most prominent at C5-6 where there is disc osteophyte complex causing anterior effacement of the thecal sac, similar to prior study. Upper chest: Motion artifact.  Lung apices appear clear. Other: None. IMPRESSION: Normal alignment. No acute displaced fractures are demonstrated. Degenerative changes. Electronically Signed   By: Burman Nieves M.D.   On: 11/27/2023 21:29   CT Head Wo Contrast Result Date: 11/27/2023 CLINICAL DATA:  Mental status change of unknown cause. Seizures with fall. Intoxicated or obtunded. EXAM: CT HEAD WITHOUT CONTRAST TECHNIQUE: Contiguous axial images were obtained from the base of the skull through the vertex without intravenous contrast. RADIATION DOSE REDUCTION: This exam was performed according to the departmental dose-optimization program which includes automated exposure control, adjustment of the mA and/or kV according to patient size and/or use of iterative reconstruction technique. COMPARISON:  CT head 11/06/2022.  MRI brain 09/14/2021 FINDINGS: Brain: No evidence of acute infarction, hemorrhage, hydrocephalus, extra-axial collection or mass lesion/mass effect. Vascular: No hyperdense vessel or unexpected calcification. Skull: Normal. Negative for fracture or focal lesion. Sinuses/Orbits: Mucosal thickening in the paranasal sinuses with secretions in the left maxillary antrum. No acute air-fluid levels. Mastoid air cells are clear. Other: None. IMPRESSION: No acute intracranial abnormalities. Chronic inflammatory changes suggested in the paranasal sinuses. Electronically Signed   By: Burman Nieves  M.D.   On: 11/27/2023 21:26   DG Chest Port 1 View Result Date: 11/27/2023 CLINICAL DATA:  ams EXAM: PORTABLE CHEST 1 VIEW COMPARISON:  Chest x-ray 07/12/2021 FINDINGS: The heart and mediastinal contours are unchanged No focal consolidation. No pulmonary edema. No pleural effusion. No pneumothorax. No acute osseous abnormality. IMPRESSION: No active disease. Electronically Signed   By: Tish Frederickson M.D.   On: 11/27/2023 21:09     Assessment and Plan:   Chloe Flis is a 53 y.o. female with a hx of seizure, alcohol abuse, hypertension, marijuana use, hyperlipidemia who is being seen 11/29/2023 for the evaluation of abnormal echo at the request of Dr. Everardo All.  New cardiomyopathy HFmrEF -- echo shows LVEF of 40-45% with grade 1 diastolic dysfunction, small pericardial effusion, basal anteroseptal, inferolateral hypokinesis. No prior echo reports found -- she does not appear volume overloaded on exam, warm and dry extremities -- CXR with no pulmonary edema noted -- unclear etiology of drop in EF but suspect could be secondary severe acute illness  -- no room for GDMT at this time with need for pressor support/shock  Status epilepticus Rhabdomyolysis  -- follows with [facility] neurology -- EEG in process -- on keppra, lacosamide, clonazepam, lamotrigine and topmax -- CK >16000, has been receiving IVFs (held today with echo findings) As above, she is warm and dry on exam. CVP 5, would continue IVFs as tolerated -- possible MRI today per neurology  NSTEMI -- of note, hsTn this morning resulted >  24000, though again the in setting of severely elevated CK total of 16109 -- as noted above, suspect this may be secondary to her acute state/severe rhabdomyolysis in setting of seizures   Acute respiratory failure with hypoxia Possible aspiration pneumonia Septic shock -- intubated on admission, PCCM managing --on precedex, antibiotics, IV pressor support with levo and vasopressin  -- work  for meningitis, thus far cultures negative   Transaminitis -- AST 297>>652>>1349, ALT 185>>279>>840 -- in setting of septic shock  AKI -- Cr 2.46>>2.8>>3.06>>3.16 -- as above, would continue IVFs as tolerated   For questions or updates, please contact [facility] Please consult [email or web address] for contact info under    Signed, Laverda Page, NP  11/29/2023 9:32 AM  This patient seen and examined.  Agree with above documentation.  Ms. Stones is a 53 year old female with a history of seizures, alcohol abuse, hypertension who we are consulted for evaluation of heart failure at the request of Dr. Everardo All.  She presented on 11/27/2023 after being found unresponsive on the floor with seizure activity.  She required intubation for airway protection in the ED.  Labs notable for creatinine 2.46, AST 279, ALT 185, lactate 2.7, CK 16,000, hemoglobin 13.8, WBC 16.1.  EKG shows sinus tachycardia, rate 122, diffuse ST depressions with ST elevation in aVR.  Echocardiogram shows EF 40 to 45%, normal RV function, small pericardial effusion, no significant valvular disease.  Central line placed and CVP has been 4-8, Co-ox 91% today.  She is currently on Levophed at 19 mcg/min as well as vasopressin.  Current vent settings FiO2 40%, PEEP 5.  On exam, patient is intubated and sedated, regular rate and rhythm, no murmurs, mechanical breath sounds, no lower extremity edema, no JVD.  For her shock, do not suspect cardiogenic etiology.  EF only mildly reduced on echo and Cooxx is elevated and CVP is low, arguing against cardiogenic shock.  Suspect likely septic shock, can continue to give IV fluids as needed.  For her acute systolic heart failure, suspect stress-induced cardiomyopathy in setting of critical illness.  Cannot add GDMT at this time, as is requiring pressor support.  Will continue to monitor.  Her troponin is markedly elevated but suspect due to acute illness/rhabdomyolysis with CK 16,000; EKG shows  aVR elevation with diffuse ST depressions, suggesting global ischemia.  Certainly could have underlying CAD but not currently a candidate for ischemic evaluation.  CRITICAL CARE TIME: I have spent a total of 33 minutes with patient reviewing hospital notes, telemetry, EKGs, labs and examining the patient as well as establishing an assessment and plan that was discussed with the patient's family and PCCM.  > 50% of time was spent in direct patient care. The patient is critically ill with multi-organ system failure and requires high complexity decision making for assessment and support, frequent evaluation and titration of therapies, application of advanced monitoring technologies and extensive interpretation of multiple databases.   Little Ishikawa, MD

## 2023-11-29 NOTE — Progress Notes (Signed)
 Hemodynamics improved some s/p volume resuscitation  Pressor requirements improved.  Plan Check and trend lactate. Co-ox of 90 either means we are more than adequately providing O2 delivery OR more concerning she is not extracting. With BP improvements seems unlikely the later

## 2023-11-29 NOTE — Consult Note (Addendum)
 Attending physician's note   I have taken a history, reviewed the chart, and examined the patient. I performed a substantive portion of this encounter, including complete performance of at least one of the key components, in conjunction with the APP. I agree with the APP's note, impression, and recommendations with my edits.   53 year old female with medical history as outlined below, to include history of EtOH use disorder, seizure disorder, HTN, THC, admitted with status epilepticus.  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  Currently intubated, sedated.  GI service consulted due to elevated liver enzymes  - AST/ALT 1349/840 (uptrending) - T. bili 1.9, ALP 70 - INR 2.9 - CK >16k - WBC 16 --> 14.7 - PLT 62  Comparison labs: - 11/2022: AST/ALT 35/40, T. bili 1.1 - 01/2023: AST/ALT 29/39 - 07/2023: AST/ALT 91/158, T. bili 0.8  1) Elevated liver enzymes (shock liver) 2) Coagulopathy 3) AKI 4) Status epilepticus 5) Metabolic acidosis 6) Rhabdomyolysis 7) New onset cardiomyopathy with reduced EF 8) Acute respiratory failure with hypoxia 9) Thrombocytopenia  Suspect elevated/uptrending enzymes are due to shock liver 2/2 systemic shock and overall global illness/hypotension.  Do not think this necessarily cardiogenic hepatopathy and patient was evaluated by the inpatient Cardiology service who did not feel that this is cardiogenic shock.  Liver enzymes were largely normal last year, did have elevation in 07/2023, but no repeat to assess if these had actually normalized.  - Continue trending liver enzymes, PT/INR, CBC daily - Abdominal ultrasound with Doppler - Acute viral hepatitis panel pending - Agree with initiation of NAC - Broad-spectrum ABX as currently doing - Management of status epilepticus per Neurology and primary CCS - Continue max supportive  care as currently doing - Inpatient GI service will continue to follow  Meredith Park [phone] office         Referring Provider: Dr. Everardo All, PCCM Primary Care Physician:  Burnis Medin, [place] Primary Gastroenterologist:  Gentry Fitz  Reason for Consultation:  elevated LFTs/liver failure  HPI: Meredith Park is a 53 y.o. female with past medical history of seizure disorder, hypertension, hypercholesterolemia, and history of alcohol abuse, apparently sober for the past year.  Was admitted for status epilepticus.  Intubated and requiring pressor support.  Found to have a new right-sided heart failure with an EF of 40 to 45% and left ventricular wall motion abnormality.  It appears that in November 2024 through [facility] she had an alk phos of 107, AST 91 and ALT 558, normal total bili is 0.8.  On initial admission 2 days ago AST was 297, ALT 185, alk phos and total bili normal.  Today they have climbed to AST of 1349, ALT 840, alk phos remains normal at 70, total bili 1.9.  Her CK significantly elevated over 16K.  INR is 2.9.  Ultrasound with doppler is pending and acutel hepatitis studies are pending.  Alcohol levels normal upon admission as well as acetaminophen levels.  They have initiated NAC therapy.    It looks like she was actually seen by GI through [facility] in August 2022 for elevated LFTs just for one initial consult/visit.  She had negative ANA and anti-smooth muscle antibody at that time.  No family was present during my visit today.   Past Medical History:  Diagnosis Date   Alcohol abuse    Alcohol withdrawal (HCC) 08/28/2018  High cholesterol    Hypertension    Seizures (HCC)     No past surgical history on file.  Prior to Admission medications   Medication Sig Start Date End Date Taking? Authorizing Provider  atorvastatin (LIPITOR) 10 MG tablet Take 10 mg by mouth daily. 07/20/22  Yes [provider]  carvedilol (COREG)  12.5 MG tablet Take 1 tablet (12.5 mg total) by mouth 2 (two) times daily. 08/24/17  Yes Fayrene Helper, PA-C  clonazePAM (KLONOPIN) 0.5 MG tablet Take 1 tablet (0.5 mg total) by mouth every 8 (eight) hours. 11/09/22 11/27/23 Yes Lanae Boast, MD  ibuprofen (ADVIL) 200 MG tablet Take 600 mg by mouth every 6 (six) hours as needed for moderate pain.   Yes [provider]  lamoTRIgine (LAMICTAL) 200 MG tablet Take 200 mg by mouth 2 (two) times daily. Take medication with two (25mg ) tablet by mouth twice daily for a total of 250mg . 10/14/22  Yes [provider]  lamoTRIgine (LAMICTAL) 25 MG tablet Take 50 mg by mouth 2 (two) times daily. Take with one (200mg ) tablet by mouth twice daily for a dose total of 250mg . 09/15/22  Yes [provider]  losartan (COZAAR) 50 MG tablet Take 50 mg by mouth daily. Take only if blood pressure is over 139. 07/12/21  Yes [provider]  Midazolam (NAYZILAM) 5 MG/0.1ML SOLN PLACE 5 MG INTO THE NOSE AS NEEDED. 11/30/22  Yes Camara, Amadou, MD  SUMAtriptan (IMITREX) 100 MG tablet Take 1 tablet (100 mg total) by mouth daily as needed for migraine. 12/11/21  Yes Alvira Monday, MD  topiramate (TOPAMAX) 100 MG tablet Take 100 mg by mouth daily. 05/31/23  Yes [provider]  sertraline (ZOLOFT) 50 MG tablet Take 50 mg by mouth daily. Patient not taking: Reported on 11/07/2022 10/16/22   [provider]    Current Facility-Administered Medications  Medication Dose Route Frequency Provider Last Rate Last Admin   acetaminophen (TYLENOL) tablet 650 mg  650 mg Per Tube Q6H PRN Luciano Cutter, MD       acetylcysteine (ACETADOTE) 30.5 mg/mL load via infusion 9,600 mg  150 mg/kg Intravenous Once Luciano Cutter, MD       Followed by   acetylcysteine (ACETADOTE) 18,000 mg in dextrose 5 % 590 mL (30.5085 mg/mL) infusion  12.5 mg/kg/hr Intravenous Continuous Luciano Cutter, MD       Followed by   acetylcysteine (ACETADOTE) 18,000 mg in  dextrose 5 % 590 mL (30.5085 mg/mL) infusion  6.25 mg/kg/hr Intravenous Continuous Luciano Cutter, MD       Melene Muller ON 11/30/2023] cefTRIAXone (ROCEPHIN) 2 g in sodium chloride 0.9 % 100 mL IVPB  2 g Intravenous Q24H Luciano Cutter, MD       Chlorhexidine Gluconate Cloth 2 % PADS 6 each  6 each Topical Daily Duayne Cal, NP   6 each at 11/28/23 2157   clonazePAM (KLONOPIN) tablet 2 mg  2 mg Per Tube BID Bhagat, Srishti L, MD   2 mg at 11/29/23 1225   dexmedetomidine (PRECEDEX) 400 MCG/100ML (4 mcg/mL) infusion  0-1.2 mcg/kg/hr Intravenous Titrated Luciano Cutter, MD 6.47 mL/hr at 11/29/23 0800 0.4 mcg/kg/hr at 11/29/23 0800   docusate (COLACE) 50 MG/5ML liquid 100 mg  100 mg Per Tube BID Duayne Cal, NP   100 mg at 11/29/23 0943   docusate sodium (COLACE) capsule 100 mg  100 mg Oral BID PRN Gwynn Burly, MD       famotidine (  PEPCID) tablet 20 mg  20 mg Per Tube Daily Calton Dach I, RPH   20 mg at 11/29/23 2956   feeding supplement (OSMOLITE 1.5 CAL) liquid 1,000 mL  1,000 mL Per Tube Continuous Luciano Cutter, MD   Stopped at 11/29/23 0905   feeding supplement (PROSource TF20) liquid 60 mL  60 mL Per Tube Daily Luciano Cutter, MD   60 mL at 11/29/23 0943   fentaNYL (SUBLIMAZE) injection 50 mcg  50 mcg Intravenous Q15 min PRN Duayne Cal, NP       fentaNYL (SUBLIMAZE) injection 50-200 mcg  50-200 mcg Intravenous Q30 min PRN Duayne Cal, NP       folic acid (FOLVITE) tablet 1 mg  1 mg Per Tube Daily Luciano Cutter, MD   1 mg at 11/29/23 0943   ipratropium-albuterol (DUONEB) 0.5-2.5 (3) MG/3ML nebulizer solution 3 mL  3 mL Nebulization Q6H PRN Gwynn Burly, MD       lacosamide (VIMPAT) 200 mg in sodium chloride 0.9 % 25 mL IVPB  200 mg Intravenous Q12H Bhagat, Srishti L, MD 90 mL/hr at 11/29/23 1034 200 mg at 11/29/23 1034   lactated ringers infusion   Intravenous Continuous Luciano Cutter, MD       lamoTRIgine (LAMICTAL) tablet 25 mg  25 mg Per Tube BID  Calton Dach I, RPH       levETIRAcetam (KEPPRA) IVPB 500 mg/100 mL premix  500 mg Intravenous Q12H Bhagat, Srishti L, MD 400 mL/hr at 11/29/23 0918 500 mg at 11/29/23 2130   multivitamin with minerals tablet 1 tablet  1 tablet Per Tube Daily Calton Dach I, RPH   1 tablet at 11/29/23 0943   norepinephrine (LEVOPHED) 4mg  in (0.016 mg/mL) premix infusion  2-40 mcg/min Intravenous Titrated Celine Mans, Rahul P, PA-C 75 mL/hr at 11/29/23 1137 20 mcg/min at 11/29/23 1137   Oral care mouth rinse  15 mL Mouth Rinse Q2H Duayne Cal, NP   15 mL at 11/29/23 1225   Oral care mouth rinse  15 mL Mouth Rinse PRN Duayne Cal, NP       polyethylene glycol (MIRALAX / GLYCOLAX) packet 17 g  17 g Oral Daily PRN Gwynn Burly, MD       polyethylene glycol (MIRALAX / GLYCOLAX) packet 17 g  17 g Per Tube Daily Duayne Cal, NP   17 g at 11/28/23 1023   thiamine (VITAMIN B1) 500 mg in sodium chloride 0.9 % 50 mL IVPB  500 mg Intravenous Q8H Erick Blinks, MD   Stopped at 11/29/23 8657   Followed by   Melene Muller ON 11/30/2023] thiamine (VITAMIN B1) 250 mg in sodium chloride 0.9 % 50 mL IVPB  250 mg Intravenous Daily Erick Blinks, MD       Followed by   Melene Muller ON 12/06/2023] thiamine (VITAMIN B1) injection 100 mg  100 mg Intravenous Daily Erick Blinks, MD       topiramate (TOPAMAX) tablet 25 mg  25 mg Per Tube QHS Bhagat, Srishti L, MD       vasopressin (PITRESSIN) 20 Units in 100 mL (0.2 unit/mL) infusion-*FOR SHOCK*  0-0.04 Units/min Intravenous Continuous Luciano Cutter, MD 9 mL/hr at 11/29/23 0800 0.03 Units/min at 11/29/23 0800    Allergies as of 11/27/2023 - Review Complete 11/27/2023  Allergen Reaction Noted   Meperidine Itching 08/29/2012   Hydrochlorothiazide Other (See Comments) 11/23/2022   Keppra [levetiracetam] Other (See Comments) 11/23/2022    Family History  Problem Relation  Age of Onset   Sleep apnea Neg Hx     Social History   Socioeconomic History    Marital status: Divorced    Spouse name: Not on file   Number of children: Not on file   Years of education: Not on file   Highest education level: Not on file  Occupational History   Not on file  Tobacco Use   Smoking status: Never   Smokeless tobacco: Never  Vaping Use   Vaping status: Some Days  Substance and Sexual Activity   Alcohol use: Yes    Comment: daily, 1/2 gallon a day x 3 days after relapse, 4 years sober prior to that   Drug use: Not Currently    Types: Marijuana   Sexual activity: Not on file  Other Topics Concern   Not on file  Social History Narrative   Not on file   Social Drivers of Health   Financial Resource Strain: Not on file  Food Insecurity: Low Risk  (07/18/2023)   Received from [facility]   Hunger Vital Sign    Worried About Running Out of Food in the Last Year: Never true    Ran Out of Food in the Last Year: Never true  Transportation Needs: No Transportation Needs (07/18/2023)   Received from [facility]    In the past 12 months, has lack of reliable transportation kept you from medical appointments, meetings, work or from getting things needed for daily living? : No  Physical Activity: Not on file  Stress: Not on file  Social Connections: Not on file  Intimate Partner Violence: Not on file    Review of Systems: Intubated so unable to be obtained.  Physical Exam: Vital signs in last 24 hours: Temp:  [98 F (36.7 C)-99.3 F (37.4 C)] 98.3 F (36.8 C) (03/27 1157) Pulse Rate:  [61-77] 65 (03/27 0800) Resp:  [16-28] 21 (03/27 0800) BP: (77-130)/(18-112) 130/102 (03/27 0800) SpO2:  [97 %-100 %] 100 % (03/27 0800) FiO2 (%):  [40 %] 40 % (03/27 0800) Weight:  [64 kg] 64 kg (03/27 0500) Last BM Date : 11/28/23 General:  Intubated and sedated. Head:  Normocephalic and atraumatic. Eyes:  Sclera clear, no icterus.  Conjunctiva pink. Ears:  Normal auditory acuity. Mouth:  No deformity or lesions.   Lungs:  Clear  anteriorly.  Mechanical breath sounds noted.  Heart:  Regular rate and rhythm; no murmurs, clicks, rubs, or gallops. Abdomen:  Soft, non-distended.  BS present. Msk:  Symmetrical without gross deformities. Pulses:  Normal pulses noted. Extremities:  Without clubbing or edema. Skin:  Intact without significant lesions or rashes.  Intake/Output from previous day: 03/26 0701 - 03/27 0700 In: 6549.2 [I.V.:3758; NG/GT:677.8; IV Piggyback:2113.4] Out: 300 [Urine:300] Intake/Output this shift: Total I/O In: 256.3 [I.V.:176.2; NG/GT:30; IV Piggyback:50.1] Out: 250 [Urine:250]  Lab Results: Recent Labs    11/27/23 2058 11/28/23 0155 11/28/23 0939 11/28/23 1148 11/29/23 0417  WBC 16.1*  16.5*  --  15.6*  --  14.7*  HGB 13.8  13.9   < > 14.1 13.9 13.0  HCT 43.6  43.8   < > 43.4 41.0 40.1  PLT 197  186  --  93*  --  62*   < > = values in this interval not displayed.   BMET Recent Labs    11/28/23 0939 11/28/23 1148 11/28/23 2236 11/29/23 0417  NA 141 140 134* 133*  K 2.6* 2.8* 3.5 3.4*  CL 109  --  103 101  CO2 17*  --  19* 19*  GLUCOSE 103*  --  189* 206*  BUN 27*  --  35* 36*  CREATININE 2.80*  --  3.06* 3.16*  CALCIUM 7.8*  --  6.7* 6.8*   LFT Recent Labs    11/29/23 0843  PROT 4.2*  ALBUMIN 2.5*  AST 1,349*  ALT 840*  ALKPHOS 70  BILITOT 1.9*  BILIDIR 1.0*  IBILI 0.9   PT/INR Recent Labs    11/27/23 2058 11/29/23 0417  LABPROT 17.2* 30.8*  INR 1.4* 2.9*   Studies/Results: VAS [place] LOWER EXTREMITY VENOUS (DVT) Result Date: 11/28/2023  Lower Venous DVT Study Patient Name:  Meredith Park  Date of Exam:   11/28/2023 Medical Rec #: [id number]            Accession #:    [id number] Date of Birth: 19-Jan-1971             Patient Gender: F Patient Age:   60 years Exam Location:  [facility] Procedure:      VAS [place] LOWER EXTREMITY VENOUS (DVT) Referring Phys: Renae Fickle HOFFMAN --------------------------------------------------------------------------------   Indications: Hypoxia.  Risk Factors: None identified. Comparison Study: No prior studies. Performing Technologist: Chanda Busing RVT  Examination Guidelines: A complete evaluation includes B-mode imaging, spectral Doppler, color Doppler, and power Doppler as needed of all accessible portions of each vessel. Bilateral testing is considered an integral part of a complete examination. Limited examinations for reoccurring indications may be performed as noted. The reflux portion of the exam is performed with the patient in reverse Trendelenburg.  +---------+---------------+---------+-----------+----------+--------------+ RIGHT    CompressibilityPhasicitySpontaneityPropertiesThrombus Aging +---------+---------------+---------+-----------+----------+--------------+ CFV      Full           Yes      Yes                                 +---------+---------------+---------+-----------+----------+--------------+ SFJ      Full                                                        +---------+---------------+---------+-----------+----------+--------------+ FV Prox  Full                                                        +---------+---------------+---------+-----------+----------+--------------+ FV Mid   Full                                                        +---------+---------------+---------+-----------+----------+--------------+ FV DistalFull                                                        +---------+---------------+---------+-----------+----------+--------------+ PFV      Full                                                        +---------+---------------+---------+-----------+----------+--------------+  POP      Full           Yes      Yes                                 +---------+---------------+---------+-----------+----------+--------------+ PTV      Full                                                         +---------+---------------+---------+-----------+----------+--------------+ PERO     Full                                                        +---------+---------------+---------+-----------+----------+--------------+   +---------+---------------+---------+-----------+----------+--------------+ LEFT     CompressibilityPhasicitySpontaneityPropertiesThrombus Aging +---------+---------------+---------+-----------+----------+--------------+ CFV      Full           Yes      Yes                                 +---------+---------------+---------+-----------+----------+--------------+ SFJ      Full                                                        +---------+---------------+---------+-----------+----------+--------------+ FV Prox  Full                                                        +---------+---------------+---------+-----------+----------+--------------+ FV Mid   Full                                                        +---------+---------------+---------+-----------+----------+--------------+ FV DistalFull                                                        +---------+---------------+---------+-----------+----------+--------------+ PFV      Full                                                        +---------+---------------+---------+-----------+----------+--------------+ POP      Full           Yes      Yes                                 +---------+---------------+---------+-----------+----------+--------------+  PTV      Full                                                        +---------+---------------+---------+-----------+----------+--------------+ PERO     Full                                                        +---------+---------------+---------+-----------+----------+--------------+     Summary: RIGHT: - There is no evidence of deep vein thrombosis in the lower extremity.  - No cystic structure found in  the popliteal fossa.  LEFT: - There is no evidence of deep vein thrombosis in the lower extremity.  - No cystic structure found in the popliteal fossa.  *See table(s) above for measurements and observations. Electronically signed by Coral Else MD on 11/28/2023 at 10:01:08 PM.    Final    DG CHEST PORT 1 VIEW Result Date: 11/28/2023 CLINICAL DATA:  [id number] Encounter for central line placement [id number] EXAM: PORTABLE CHEST 1 VIEW COMPARISON:  11/28/2023 FINDINGS: Endotracheal tube is 3.9 cm above the carina. Nasogastric tube is in the left upper abdomen and probably in the proximal stomach. Left subclavian central line tip in the upper SVC. Negative for a pneumothorax. Both lungs are clear. Heart and mediastinum are within normal limits. IMPRESSION: 1. Left subclavian central line tip is in the upper SVC. Negative for a pneumothorax. 2. Endotracheal tube is in good position. 3. No focal lung disease. Electronically Signed   By: Richarda Overlie M.D.   On: 11/28/2023 15:04   ECHOCARDIOGRAM COMPLETE Result Date: 11/28/2023    ECHOCARDIOGRAM REPORT   Patient Name:   Meredith Park Date of Exam: 11/28/2023 Medical Rec #:  [id number]           Height:       62.0 in Accession #:    [id number]          Weight:       142.6 lb Date of Birth:  [birth year]            BSA:          1.656 m Patient Age:    53 years            BP:           85/67 mmHg Patient Gender: F                   HR:           65 bpm. Exam Location:  Inpatient Procedure: 2D Echo, Color Doppler and Cardiac Doppler (Both Spectral and Color            Flow Doppler were utilized during procedure). Indications:    R06.9 DOE  History:        Patient has no prior history of Echocardiogram examinations.                 Risk Factors:Hypertension and Dyslipidemia.  Sonographer:    Irving Burton Senior RDCS Referring Phys: [phone] Clarene Critchley [facility]  Sonographer Comments: Scanned supine on artificial respirator IMPRESSIONS  1. Left ventricular ejection fraction, by estimation, is 40  to 45%. The  left ventricle has mildly decreased function. The left ventricle demonstrates regional wall motion abnormalities (see scoring diagram/findings for description). Left ventricular diastolic parameters are consistent with Grade I diastolic dysfunction (impaired relaxation).  2. Right ventricular systolic function is normal. The right ventricular size is normal.  3. A small pericardial effusion is present. The pericardial effusion is circumferential. There is no evidence of cardiac tamponade.  4. The mitral valve is normal in structure. Trivial mitral valve regurgitation.  5. The aortic valve is tricuspid. Aortic valve regurgitation is not visualized. FINDINGS  Left Ventricle: Left ventricular ejection fraction, by estimation, is 40 to 45%. The left ventricle has mildly decreased function. The left ventricle demonstrates regional wall motion abnormalities. The left ventricular internal cavity size was normal in size. There is no left ventricular hypertrophy. Left ventricular diastolic parameters are consistent with Grade I diastolic dysfunction (impaired relaxation).  LV Wall Scoring: The basal anteroseptal segment, basal inferolateral segment, basal anterolateral segment, basal anterior segment, basal inferior segment, and basal inferoseptal segment are hypokinetic. Right Ventricle: The right ventricular size is normal. No increase in right ventricular wall thickness. Right ventricular systolic function is normal. Left [facility]: Left atrial size was normal in size. Right [facility]: Right atrial size was normal in size. Pericardium: A small pericardial effusion is present. The pericardial effusion is circumferential. There is no evidence of cardiac tamponade. Mitral Valve: The mitral valve is normal in structure. Trivial mitral valve regurgitation. Tricuspid Valve: The tricuspid valve is normal in structure. Tricuspid valve regurgitation is mild. Aortic Valve: The aortic valve is tricuspid. Aortic valve  regurgitation is not visualized. Pulmonic Valve: The pulmonic valve was normal in structure. Pulmonic valve regurgitation is trivial. Aorta: The aortic root is normal in size and structure and the ascending aorta was not well visualized. Venous: IVC assessment for right atrial pressure unable to be performed due to mechanical ventilation. IAS/Shunts: No atrial level shunt detected by color flow Doppler.  LEFT VENTRICLE PLAX 2D LVIDd:         4.40 cm     Diastology LVIDs:         3.90 cm     LV e' medial:    4.57 cm/s LV PW:         1.00 cm     LV E/e' medial:  12.5 LV IVS:        0.60 cm     LV e' lateral:   5.33 cm/s LVOT diam:     2.00 cm     LV E/e' lateral: 10.7 LV SV:         55 LV SV Index:   33 LVOT Area:     3.14 cm  LV Volumes (MOD) LV vol d, MOD A2C: 49.9 ml LV vol d, MOD A4C: 64.3 ml LV vol s, MOD A2C: 26.2 ml LV vol s, MOD A4C: 30.4 ml LV SV MOD A2C:     23.7 ml LV SV MOD A4C:     64.3 ml LV SV MOD BP:      26.8 ml RIGHT VENTRICLE RV S prime:     9.79 cm/s TAPSE (M-mode): 1.9 cm LEFT ATRIUM             Index        RIGHT ATRIUM           Index LA diam:        2.80 cm 1.69 cm/m   RA Area:     13.30 cm LA Vol (A2C):  36.0 ml 21.74 ml/m  RA Volume:   34.10 ml  20.59 ml/m LA Vol (A4C):   34.5 ml 20.83 ml/m LA Biplane Vol: 36.5 ml 22.04 ml/m  AORTIC VALVE LVOT Vmax:   92.00 cm/s LVOT Vmean:  63.900 cm/s LVOT VTI:    0.174 m  AORTA Ao Root diam: 3.00 cm MITRAL VALVE               TRICUSPID VALVE MV Area (PHT): 3.91 cm    TR Peak grad:   17.6 mmHg MV Decel Time: 194 msec    TR Vmax:        210.00 cm/s MV E velocity: 56.90 cm/s MV A velocity: 66.30 cm/s  SHUNTS MV E/A ratio:  0.86        Systemic VTI:  0.17 m                            Systemic Diam: 2.00 cm Donato Schultz MD Electronically signed by Donato Schultz MD Signature Date/Time: 11/28/2023/2:01:15 PM    Final    Overnight EEG with video Result Date: 11/28/2023 Charlsie Quest, MD     11/29/2023 10:00 AM Patient Name: Meredith Park MRN:  [id number] Epilepsy Attending: Charlsie Quest Referring Physician/Provider: Erick Blinks, MD Duration: 11/28/2023 0239 to 11/29/2023 0239 Patient history: 53 y.o. female with hx of alcohol and substance use, Htn, seizures on lamotrigine and topamax who was found seizing for unknown duration. EEG to evaluate for seizure Level of alertness:  comatose/ lethargic AEDs during EEG study: LEV, versed Technical aspects: This EEG study was done with scalp electrodes positioned according to the 10-20 International system of electrode placement. Electrical activity was reviewed with band pass filter of 1-70Hz , sensitivity of 7 uV/mm, display speed of 80mm/sec with a 60Hz  notched filter applied as appropriate. EEG data were recorded continuously and digitally stored.  Video monitoring was available and reviewed as appropriate. Description: EEG showed continuous generalized and lateralized right hemisphere 3 to 6 Hz theta-delta slowing.  Sharp waves were noted in right hemisphere, maximal right temporal region, quasi periodic at 1 2 5  to 1 Hz, at times rhythmic and lasting 10 to 15 seconds without definite evolution. Hyperventilation and photic stimulation were not performed.   ABNORMALITY -Sharp waves, right hemisphere -Continuous slow, generalized and lateralized right hemisphere IMPRESSION: This study showed evidence of epileptogenicity and cortical dysfunction arising from right hemisphere, maximal right temporal region with increased risk of seizure recurrence.  No definite seizures were noted Priyanka Annabelle Harman   US RENAL Result Date: 11/28/2023 CLINICAL DATA:  Acute kidney injury EXAM: RENAL / URINARY TRACT ULTRASOUND COMPLETE COMPARISON:  None Available. FINDINGS: Right Kidney: Renal measurements: 13 x 6.2 x 4.8 cm = volume: 202 mL. Echogenicity within normal limits. No mass or hydronephrosis visualized. Left Kidney: Renal measurements: 12.3 x 6.3 x 5.2 cm = volume: 209 mL. Echogenicity within normal limits. No  mass or hydronephrosis visualized. Bladder: Appears normal for degree of bladder distention. Other: None. IMPRESSION: Normal ultrasound appearance of the kidneys. No solid mass or hydronephrosis. Electronically Signed   By: Burman Nieves M.D.   On: 11/28/2023 03:14   CT ANGIO HEAD NECK W WO CM Result Date: 11/28/2023 CLINICAL DATA:  Neuro deficit, acute, stroke suspected obtunded mentation, rule out basilar thrombosis EXAM: CT ANGIOGRAPHY HEAD AND NECK WITH AND WITHOUT CONTRAST TECHNIQUE: Multidetector CT imaging of the head and neck was performed using the standard protocol during bolus  administration of intravenous contrast. Multiplanar CT image reconstructions and MIPs were obtained to evaluate the vascular anatomy. Carotid stenosis measurements (when applicable) are obtained utilizing NASCET criteria, using the distal internal carotid diameter as the denominator. RADIATION DOSE REDUCTION: This exam was performed according to the departmental dose-optimization program which includes automated exposure control, adjustment of the mA and/or kV according to patient size and/or use of iterative reconstruction technique. CONTRAST:  75mL OMNIPAQUE IOHEXOL 350 MG/ML SOLN COMPARISON:  March 25, 25 CT head. FINDINGS: CTA NECK FINDINGS Aortic arch: Great vessel origins are patent without significant stenosis. Right carotid system: No evidence of dissection, stenosis (50% or greater), or occlusion. Left carotid system: No evidence of dissection, stenosis (50% or greater), or occlusion. Vertebral arteries: Codominant. No evidence of dissection, stenosis (50% or greater), or occlusion. Skeleton: No acute abnormality. Other neck: No acute abnormality on limited assessment. Upper chest: Visualized lung apices are clear. Review of the MIP images confirms the above findings CTA HEAD FINDINGS Anterior circulation: Bilateral intracranial ICAs, MCAs, and ACAs are patent without proximal hemodynamically significant stenosis.  Azygos ACA, anatomic variant. Posterior circulation: Bilateral intradural vertebral arteries, basilar artery and bilateral posterior cerebral arteries are patent without proximal hemodynamically significant stenosis. Venous sinuses: As permitted by contrast timing, patent. Review of the MIP images confirms the above findings IMPRESSION: No large vessel occlusion or proximal hemodynamically significant stenosis. Electronically Signed   By: Feliberto Harts M.D.   On: 11/28/2023 01:48   Portable Chest x-ray Result Date: 11/28/2023 CLINICAL DATA:  Check endotracheal tube and gastric catheter placement EXAM: PORTABLE CHEST 1 VIEW COMPARISON:  Film from the previous day. FINDINGS: Cardiac shadow is stable. Endotracheal tube is noted with the tip directed into the mainstem bronchus. This should be withdrawn approximately 3 cm. Gastric catheter extends into the stomach although the proximal side port is noted in the distal esophagus. This could also be advanced deeper into the stomach. Lungs are clear. IMPRESSION: Tubes and lines as described above. Gastric catheter should be advanced deeper into the stomach in the endotracheal tube should be withdrawn as described. These results will be called to the ordering clinician or representative by the Radiologist Assistant, and communication documented in the PACS or [employer]. Electronically Signed   By: Alcide Clever M.D.   On: 11/28/2023 01:05   CT Cervical Spine Wo Contrast Result Date: 11/27/2023 CLINICAL DATA:  Neck trauma. Intoxicated or obtunded. Seizures with a fall. EXAM: CT CERVICAL SPINE WITHOUT CONTRAST TECHNIQUE: Multidetector CT imaging of the cervical spine was performed without intravenous contrast. Multiplanar CT image reconstructions were also generated. RADIATION DOSE REDUCTION: This exam was performed according to the departmental dose-optimization program which includes automated exposure control, adjustment of the mA and/or kV according to  patient size and/or use of iterative reconstruction technique. COMPARISON:  08/05/2021 FINDINGS: Alignment: Normal alignment. Skull base and vertebrae: Skull base appears intact. No vertebral compression deformities. No focal bone lesion or bone destruction. Bone cortex appears intact. Soft tissues and spinal canal: No prevertebral soft tissue swelling. No abnormal paraspinal soft tissue mass or infiltration. Disc levels: Degenerative changes with disc space narrowing and endplate osteophyte formation throughout. Changes are most prominent at C5-6 where there is disc osteophyte complex causing anterior effacement of the thecal sac, similar to prior study. Upper chest: Motion artifact.  Lung apices appear clear. Other: None. IMPRESSION: Normal alignment. No acute displaced fractures are demonstrated. Degenerative changes. Electronically Signed   By: Burman Nieves M.D.   On: 11/27/2023 21:29  CT Head Wo Contrast Result Date: 11/27/2023 CLINICAL DATA:  Mental status change of unknown cause. Seizures with fall. Intoxicated or obtunded. EXAM: CT HEAD WITHOUT CONTRAST TECHNIQUE: Contiguous axial images were obtained from the base of the skull through the vertex without intravenous contrast. RADIATION DOSE REDUCTION: This exam was performed according to the departmental dose-optimization program which includes automated exposure control, adjustment of the mA and/or kV according to patient size and/or use of iterative reconstruction technique. COMPARISON:  CT head 11/06/2022.  MRI brain 09/14/2021 FINDINGS: Brain: No evidence of acute infarction, hemorrhage, hydrocephalus, extra-axial collection or mass lesion/mass effect. Vascular: No hyperdense vessel or unexpected calcification. Skull: Normal. Negative for fracture or focal lesion. Sinuses/Orbits: Mucosal thickening in the paranasal sinuses with secretions in the left maxillary antrum. No acute air-fluid levels. Mastoid air cells are clear. Other: None.  IMPRESSION: No acute intracranial abnormalities. Chronic inflammatory changes suggested in the paranasal sinuses. Electronically Signed   By: Burman Nieves M.D.   On: 11/27/2023 21:26   DG Chest Port 1 View Result Date: 11/27/2023 CLINICAL DATA:  ams EXAM: PORTABLE CHEST 1 VIEW COMPARISON:  Chest x-ray 07/12/2021 FINDINGS: The heart and mediastinal contours are unchanged No focal consolidation. No pulmonary edema. No pleural effusion. No pneumothorax. No acute osseous abnormality. IMPRESSION: No active disease. Electronically Signed   By: Tish Frederickson M.D.   On: 11/27/2023 21:09    IMPRESSION:  *Status epilepticus:  Apparently had some medication mixup and ran out of medication, etc.  Neurology is on board. *Acute respiratory failure with hypoxia from presumable aspiration pneumonia.  On full vent support currently. *New onset cardiomyopathy with EF of 40 to 45% and new left ventricular wall abnormality. *Transaminitis: Looks like she has some history of some slight elevation, probably due to alcohol use although apparently has been sober for a year according to family.  It appears that in November 2024 through [facility] she had an alk phos of 107, AST 91 and ALT 558, normal total bili is 0.8.  Was evaluated for elevated LFTs through [facility] in 2022.  She had some mild elevation when admitted 2 days ago and have trended upward since then.  Has a significantly elevated CK.  INR is 2.9.  Suspect shock liver and component of rhabdomyolysis as she was found down at home with seizure as above.  Blood pressures had been low and requiring pressor support.  Ultrasound with doppler has been ordered as well as acute viral hepatitis panel.  ANA and ASMA negative in 2022. *Thrombocytopenia:  Platelets 62K today but were normal on admission.  PLAN: -Await results of ultrasound with doppler and acute viral hepatitis panel. -Continue to trend LFTs and PT/INR.  Could try some IV vitamin K.   -Continue  with NAC protocol. -Continue supportive care.  Princella Pellegrini. Zehr  11/29/2023, 12:50 PM

## 2023-11-29 NOTE — Progress Notes (Addendum)
 NEUROLOGY CONSULT FOLLOW UP NOTE   Date of service: November 29, 2023 Patient Name: Meredith Park MRN:  [id number] DOB:  11-Apr-1971  Interval Hx/subjective   Tmax 99.70F, CK 16,667 -> 16,221, Cr 2.38->3.16. AST/ALT 297/185-> 652/279.  Requiring some vasopressor support still Topirmate level pending  Remains on acyclovir, rocephin, and vancomycin  Lumbar puncture done yesterday, results reassuring  Current AEDs:  Keppra 500mg  BID Vimpat 200mg  BID Klonopin 2.5 mg BID  Vitals   Vitals:   11/29/23 0645 11/29/23 0700 11/29/23 0715 11/29/23 0800  BP: (!) 106/44 (!) 118/94 (!) 114/96   Pulse: 71 70 68   Resp: (!) 24 (!) 24 (!) 22   Temp:    98 F (36.7 C)  TempSrc:    Axillary  SpO2: 100% 100% 100%   Weight:      Height:         Body mass index is 25.8 kg/m.  Physical Exam   Constitutional: Ill appearing  Eyes: No scleral injection.  HENT: No OP obstruction. Residual blood in mouth Cardiovascular: Normal rate and regular rhythm.  Respiratory: Mechanically ventilated, breathing over the vent  Neurologic Examination   Neuro: On Precedex and Fentanyl Mental Status: Intubated, unable to follow commands  Cranial Nerves: II: Pupils are equal, round, and reactive to light.  Corneal sluggish (slightly weaker on the left than the right). Pupils R 3mm, L 3mm, brisk   III, IV, VI: VOR intact VIII: No response to verbal stimuli  X: Cough weak, gag absent  UU:VOZD midline  XII: Tongue protrudes midline without atrophy or fasciculations.  Motor/Sensory: Flicker of movement in bilateral lower extremities. No response to noxious stimuli in the bilateral upper extremities  Reflexes: Left brachioradialis 3+, right 2+ Cerebellar: Unable to assess  Medications  Current Facility-Administered Medications:    acetaminophen (TYLENOL) tablet 650 mg, 650 mg, Per Tube, Q6H PRN, Luciano Cutter, MD   acyclovir (ZOVIRAX) 630 mg in dextrose 5 % 100 mL IVPB, 10 mg/kg, Intravenous,  Q24H, Stevphen Rochester, RPH, Stopped at 11/28/23 2334   ampicillin (OMNIPEN) 2 g in sodium chloride 0.9 % 100 mL IVPB, 2 g, Intravenous, Q8H, Stevphen Rochester, RPH, Stopped at 11/29/23 0557   cefTRIAXone (ROCEPHIN) 2 g in sodium chloride 0.9 % 100 mL IVPB, 2 g, Intravenous, Q12H, Stevphen Rochester, RPH, Stopped at 11/28/23 2228   Chlorhexidine Gluconate Cloth 2 % PADS 6 each, 6 each, Topical, Daily, Duayne Cal, NP, 6 each at 11/28/23 2157   clonazePAM (KLONOPIN) tablet 0.5 mg, 0.5 mg, Per Tube, BID, Luciano Cutter, MD, 0.5 mg at 11/28/23 2158   dexmedetomidine (PRECEDEX) 400 MCG/100ML (4 mcg/mL) infusion, 0-1.2 mcg/kg/hr, Intravenous, Titrated, Luciano Cutter, MD, Last Rate: 6.47 mL/hr at 11/29/23 0700, 0.4 mcg/kg/hr at 11/29/23 0700   docusate (COLACE) 50 MG/5ML liquid 100 mg, 100 mg, Per Tube, BID, Duayne Cal, NP, 100 mg at 11/28/23 2158   docusate sodium (COLACE) capsule 100 mg, 100 mg, Oral, BID PRN, Reita May, Rutwij, MD   famotidine (PEPCID) tablet 20 mg, 20 mg, Per Tube, Daily, Calton Dach I, RPH, 20 mg at 11/28/23 6644   feeding supplement (OSMOLITE 1.5 CAL) liquid 1,000 mL, 1,000 mL, Per Tube, Continuous, Luciano Cutter, MD, Last Rate: 30 mL/hr at 11/29/23 0700, Infusion Verify at 11/29/23 0700   feeding supplement (PROSource TF20) liquid 60 mL, 60 mL, Per Tube, Daily, Luciano Cutter, MD, 60 mL at 11/28/23 1633   fentaNYL (SUBLIMAZE) injection 50 mcg, 50 mcg,  Intravenous, Q15 min PRN, Duayne Cal, NP   fentaNYL (SUBLIMAZE) injection 50-200 mcg, 50-200 mcg, Intravenous, Q30 min PRN, Duayne Cal, NP   folic acid (FOLVITE) tablet 1 mg, 1 mg, Per Tube, Daily, Luciano Cutter, MD   ipratropium-albuterol (DUONEB) 0.5-2.5 (3) MG/3ML nebulizer solution 3 mL, 3 mL, Nebulization, Q6H PRN, Reita May, Rutwij, MD   lacosamide (VIMPAT) 200 mg in sodium chloride 0.9 % 25 mL IVPB, 200 mg, Intravenous, Q12H, Jahdai Padovano L, MD, Stopped at 11/29/23 0004   lactated ringers  infusion, , Intravenous, Continuous, Luciano Cutter, MD   levETIRAcetam (KEPPRA) IVPB 500 mg/100 mL premix, 500 mg, Intravenous, Q12H, Lariyah Shetterly L, MD, Stopped at 11/28/23 2200   multivitamin with minerals tablet 1 tablet, 1 tablet, Per Tube, Daily, Calton Dach I, RPH, 1 tablet at 11/28/23 4098   norepinephrine (LEVOPHED) 4mg  in (0.016 mg/mL) premix infusion, 2-40 mcg/min, Intravenous, Titrated, Desai, Rahul P, PA-C, Last Rate: 63.8 mL/hr at 11/29/23 0755, 17 mcg/min at 11/29/23 0755   Oral care mouth rinse, 15 mL, Mouth Rinse, Q2H, Duayne Cal, NP, 15 mL at 11/29/23 0755   Oral care mouth rinse, 15 mL, Mouth Rinse, PRN, Duayne Cal, NP   polyethylene glycol (MIRALAX / GLYCOLAX) packet 17 g, 17 g, Oral, Daily PRN, Reita May, Rutwij, MD   polyethylene glycol (MIRALAX / GLYCOLAX) packet 17 g, 17 g, Per Tube, Daily, Duayne Cal, NP, 17 g at 11/28/23 1023   potassium chloride 10 mEq in 50 mL *CENTRAL LINE* IVPB, 10 mEq, Intravenous, Q1 Hr x 2, Ogan, Okoronkwo U, MD, Last Rate: 50 mL/hr at 11/29/23 0804, 10 mEq at 11/29/23 0804   sodium bicarbonate 150 mEq in sterile water 1,150 mL infusion, , Intravenous, Continuous, Luciano Cutter, MD, Last Rate: 100 mL/hr at 11/29/23 0700, Infusion Verify at 11/29/23 0700   thiamine (VITAMIN B1) 500 mg in sodium chloride 0.9 % 50 mL IVPB, 500 mg, Intravenous, Q8H, Stopped at 11/29/23 0649 **FOLLOWED BY** [START ON 11/30/2023] thiamine (VITAMIN B1) 250 mg in sodium chloride 0.9 % 50 mL IVPB, 250 mg, Intravenous, Daily **FOLLOWED BY** [START ON 12/06/2023] thiamine (VITAMIN B1) injection 100 mg, 100 mg, Intravenous, Daily, Erick Blinks, MD   vancomycin (VANCOREADY) IVPB 750 mg/150 mL, 750 mg, Intravenous, Q24H, Ledford, James L, RPH   vasopressin (PITRESSIN) 20 Units in 100 mL (0.2 unit/mL) infusion-*FOR SHOCK*, 0-0.04 Units/min, Intravenous, Continuous, Luciano Cutter, MD, Last Rate: 9 mL/hr at 11/29/23 0700, 0.03 Units/min at 11/29/23  0700  Labs and Diagnostic Imaging   CBC:  Recent Labs  Lab 11/27/23 2058 11/28/23 0155 11/28/23 0939 11/28/23 1148 11/29/23 0417  WBC 16.1*  16.5*  --  15.6*  --  14.7*  NEUTROABS 13.8*  --   --   --   --   HGB 13.8  13.9   < > 14.1 13.9 13.0  HCT 43.6  43.8   < > 43.4 41.0 40.1  MCV 86.0  86.7  --  85.1  --  83.4  PLT 197  186  --  93*  --  62*   < > = values in this interval not displayed.    Basic Metabolic Panel:  Lab Results  Component Value Date   NA 133 (L) 11/29/2023   K 3.4 (L) 11/29/2023   CO2 19 (L) 11/29/2023   GLUCOSE 206 (H) 11/29/2023   BUN 36 (H) 11/29/2023   CREATININE 3.16 (H) 11/29/2023   CALCIUM 6.8 (L) 11/29/2023   GFRNONAA  17 (L) 11/29/2023   GFRAA >60 11/10/2018   Urine Drug Screen:     Component Value Date/Time   LABOPIA NONE DETECTED 11/27/2023 2207   COCAINSCRNUR NONE DETECTED 11/27/2023 2207   LABBENZ POSITIVE (A) 11/27/2023 2207   AMPHETMU NONE DETECTED 11/27/2023 2207   THCU POSITIVE (A) 11/27/2023 2207   LABBARB NONE DETECTED 11/27/2023 2207    Alcohol Level     Component Value Date/Time   ETH <10 11/27/2023 2058   INR  Lab Results  Component Value Date   INR 2.9 (H) 11/29/2023   AED levels:  Lab Results  Component Value Date   LAMOTRIGINE 8.1 11/06/2022   Lab Results  Component Value Date   CKTOTAL 16,221 (H) 11/29/2023    Latest Reference Range & Units 11/28/23 14:28  Appearance, CSF CLEAR  HAZY !  Glucose, CSF 40 - 70 mg/dL 71 (H)  RBC Count, CSF 0 /cu mm 1,045 (H)  WBC, CSF 0 - 5 /cu mm 3  Other Cells, CSF  TOO FEW TO COUNT, SMEAR AVAILABLE FOR REVIEW  Color, CSF COLORLESS  PINK !  Supernatant  COLORLESS  Total  Protein, CSF 15 - 45 mg/dL 74 (H)  Tube #  1,4  !: Data is abnormal (H): Data is abnormally high  CT Head without contrast(Personally reviewed): No acute intracranial abnormalities. Chronic inflammatory changes suggested in the paranasal sinuses.  CT angio Head and Neck with  contrast(Personally reviewed): No large vessel occlusion or proximal hemodynamically significant stenosis.  cEEG: 11/28/2023 0239 to 11/28/2023 0845   IMPRESSION: This study showed evidence of epileptogenicity and cortical dysfunction arising from right hemisphere, maximal right temporal region with increased risk of seizure recurrence.  No definite seizures were noted  cEEG Duration: 11/29/2023 0239 to 11/29/2023 1000  -Lateralized periodic discharges, right hemisphere -Continuous slow, generalized and lateralized right hemisphere   Assessment   Meredith Park is a 53 y.o. female with hx of alcohol and substance use, Htn, seizures on lamotrigine and topamax who was found seizing for unknown duration.  She was given Versed in the field with resolution of clinical seizure activity noted.  Obtunded mentation for several hours after arrival with no significant improvement.  CT head without contrast with no acute intracranial abnormality.  CT angio of the head and neck with no basilar thrombosis or LVO. CK slightly improved today, renal and liver function worsening. CSF glucose 71, protein 74, white blood cells 3, meningitis/encephalitis panel negative.  Neuroexam with pupils, corneals, cough intact.  Flicker of movement in bilateral lower extremities to noxious stimuli but no response in upper extremities.  EEG remains in place.  Recommendations  - continue LTM EEG, will follow up pending report and adjust further as needed, on brief bedside review right sided sharp waves continue with some periodicity - Keppra 500mg  BID (not a good long-term option due to reported side effects); continue for now  - Lacosamide 200mg  BID; continue for now - Clonazepam increase to 2 mg BID  - Will give one time fosphenytoin load as well (avoiding maintenance due to interactions w/ Lamictal) - Topiramate level and volatile levels pending  - Start Lamotrigine 25 BID per tube, (therapeutic level on 3/8, level this  admission still pending) - Start Topiramate 25 at bedtime per tube for now assuming she had been out for weeks at home; if level results higher may uptitrate more aggressively - Consider MRI brain after LTM EEG off (non-compatible leads) depending on clinical course) - Okay to deescalate meninigitis  coverage, low concern given CSF results - Appreciate CCM management of rhabdomyolysis and other comorbidities ______________________________________________________________________   Signed, Elmer Picker, NP [facility]  Attending Neurologist's note:  I personally saw this patient, gathering history, performing a full neurologic examination, reviewing relevant labs, personally reviewing relevant imaging, and formulated the assessment and plan, adding the note above for completeness and clarity to accurately reflect my thoughts  Brooke Dare MD-PhD [facility] [phone]  CRITICAL CARE Performed by: Gordy Councilman   Total critical care time: 35 minutes  Critical care time was exclusive of separately billable procedures and treating other patients.  Critical care was necessary to treat or prevent imminent or life-threatening deterioration.  Critical care was time spent personally by me on the following activities: development of treatment plan with patient and/or surrogate as well as nursing, discussions with consultants, evaluation of patient's response to treatment, examination of patient, obtaining history from patient or surrogate, ordering and performing treatments and interventions, ordering and review of laboratory studies, ordering and review of radiographic studies, pulse oximetry and re-evaluation of patient's condition.

## 2023-11-29 NOTE — Progress Notes (Signed)
 EEG maint complete. No skin breakdown fp1 fp2 f3

## 2023-11-30 ENCOUNTER — Encounter (HOSPITAL_COMMUNITY)

## 2023-11-30 ENCOUNTER — Inpatient Hospital Stay (HOSPITAL_COMMUNITY)

## 2023-11-30 DIAGNOSIS — R579 Shock, unspecified: Secondary | ICD-10-CM | POA: Diagnosis not present

## 2023-11-30 DIAGNOSIS — J9601 Acute respiratory failure with hypoxia: Secondary | ICD-10-CM | POA: Diagnosis not present

## 2023-11-30 DIAGNOSIS — I429 Cardiomyopathy, unspecified: Secondary | ICD-10-CM | POA: Diagnosis not present

## 2023-11-30 DIAGNOSIS — M6282 Rhabdomyolysis: Secondary | ICD-10-CM

## 2023-11-30 DIAGNOSIS — D689 Coagulation defect, unspecified: Secondary | ICD-10-CM | POA: Diagnosis not present

## 2023-11-30 DIAGNOSIS — I214 Non-ST elevation (NSTEMI) myocardial infarction: Secondary | ICD-10-CM | POA: Diagnosis not present

## 2023-11-30 DIAGNOSIS — G40901 Epilepsy, unspecified, not intractable, with status epilepticus: Secondary | ICD-10-CM | POA: Diagnosis not present

## 2023-11-30 DIAGNOSIS — R570 Cardiogenic shock: Secondary | ICD-10-CM | POA: Diagnosis not present

## 2023-11-30 DIAGNOSIS — D72829 Elevated white blood cell count, unspecified: Secondary | ICD-10-CM

## 2023-11-30 DIAGNOSIS — I5021 Acute systolic (congestive) heart failure: Secondary | ICD-10-CM | POA: Diagnosis not present

## 2023-11-30 DIAGNOSIS — R748 Abnormal levels of other serum enzymes: Secondary | ICD-10-CM | POA: Diagnosis not present

## 2023-11-30 DIAGNOSIS — N179 Acute kidney failure, unspecified: Secondary | ICD-10-CM | POA: Diagnosis not present

## 2023-11-30 LAB — COMPREHENSIVE METABOLIC PANEL WITH GFR
ALT: 1705 U/L — ABNORMAL HIGH (ref 0–44)
ALT: 1650 U/L — ABNORMAL HIGH (ref 0–44)
AST: 1902 U/L — ABNORMAL HIGH (ref 15–41)
AST: 1586 U/L — ABNORMAL HIGH (ref 15–41)
Albumin: 2.2 g/dL — ABNORMAL LOW (ref 3.5–5.0)
Albumin: 2.1 g/dL — ABNORMAL LOW (ref 3.5–5.0)
Alkaline Phosphatase: 62 U/L (ref 38–126)
Alkaline Phosphatase: 57 U/L (ref 38–126)
Anion gap: 14 (ref 5–15)
Anion gap: 12 (ref 5–15)
BUN: 42 mg/dL — ABNORMAL HIGH (ref 6–20)
BUN: 45 mg/dL — ABNORMAL HIGH (ref 6–20)
CO2: 18 mmol/L — ABNORMAL LOW (ref 22–32)
CO2: 17 mmol/L — ABNORMAL LOW (ref 22–32)
Calcium: 7.4 mg/dL — ABNORMAL LOW (ref 8.9–10.3)
Calcium: 7.3 mg/dL — ABNORMAL LOW (ref 8.9–10.3)
Chloride: 101 mmol/L (ref 98–111)
Chloride: 103 mmol/L (ref 98–111)
Creatinine, Ser: 3.26 mg/dL — ABNORMAL HIGH (ref 0.44–1.00)
Creatinine, Ser: 3.23 mg/dL — ABNORMAL HIGH (ref 0.44–1.00)
GFR, Estimated: 16 mL/min — ABNORMAL LOW (ref >60)
GFR, Estimated: 17 mL/min — ABNORMAL LOW (ref >60)
Glucose, Bld: 167 mg/dL — ABNORMAL HIGH (ref 70–99)
Glucose, Bld: 178 mg/dL — ABNORMAL HIGH (ref 70–99)
Potassium: 3.3 mmol/L — ABNORMAL LOW (ref 3.5–5.1)
Potassium: 3 mmol/L — ABNORMAL LOW (ref 3.5–5.1)
Sodium: 133 mmol/L — ABNORMAL LOW (ref 135–145)
Sodium: 132 mmol/L — ABNORMAL LOW (ref 135–145)
Total Bilirubin: 2.3 mg/dL — ABNORMAL HIGH (ref 0.0–1.2)
Total Bilirubin: 1.8 mg/dL — ABNORMAL HIGH (ref 0.0–1.2)
Total Protein: 4.2 g/dL — ABNORMAL LOW (ref 6.5–8.1)
Total Protein: 3.8 g/dL — ABNORMAL LOW (ref 6.5–8.1)

## 2023-11-30 LAB — COOXEMETRY PANEL
Carboxyhemoglobin: 1.2 % (ref 0.5–1.5)
Methemoglobin: 0.8 % (ref 0.0–1.5)
O2 Saturation: 98.8 %
Total hemoglobin: 13.1 g/dL (ref 12.0–16.0)

## 2023-11-30 LAB — CBC
HCT: 37.7 % (ref 36.0–46.0)
Hemoglobin: 12.7 g/dL (ref 12.0–15.0)
MCH: 27.4 pg (ref 26.0–34.0)
MCHC: 33.7 g/dL (ref 30.0–36.0)
MCV: 81.3 fL (ref 80.0–100.0)
Platelets: 46 10*3/uL — ABNORMAL LOW (ref 150–400)
RBC: 4.64 MIL/uL (ref 3.87–5.11)
RDW: 15.9 % — ABNORMAL HIGH (ref 11.5–15.5)
WBC: 11.4 10*3/uL — ABNORMAL HIGH (ref 4.0–10.5)
nRBC: 0.4 % — ABNORMAL HIGH (ref 0.0–0.2)

## 2023-11-30 LAB — PROTIME-INR
INR: 2.3 — ABNORMAL HIGH (ref 0.8–1.2)
Prothrombin Time: 25.6 s — ABNORMAL HIGH (ref 11.4–15.2)

## 2023-11-30 LAB — CYTOLOGY - NON PAP

## 2023-11-30 LAB — LACTIC ACID, PLASMA
Lactic Acid, Venous: 2.3 mmol/L (ref 0.5–1.9)
Lactic Acid, Venous: 2.1 mmol/L (ref 0.5–1.9)

## 2023-11-30 LAB — GLUCOSE, CAPILLARY
Glucose-Capillary: 125 mg/dL — ABNORMAL HIGH (ref 70–99)
Glucose-Capillary: 130 mg/dL — ABNORMAL HIGH (ref 70–99)
Glucose-Capillary: 139 mg/dL — ABNORMAL HIGH (ref 70–99)
Glucose-Capillary: 148 mg/dL — ABNORMAL HIGH (ref 70–99)
Glucose-Capillary: 178 mg/dL — ABNORMAL HIGH (ref 70–99)
Glucose-Capillary: 185 mg/dL — ABNORMAL HIGH (ref 70–99)

## 2023-11-30 LAB — CK: Total CK: 10928 U/L — ABNORMAL HIGH (ref 38–234)

## 2023-11-30 MED ORDER — TOPIRAMATE 25 MG PO TABS
50.0000 mg | ORAL_TABLET | Freq: Every day | ORAL | Status: DC
  Administered 2023-11-30 – 2023-12-01 (×2): 50 mg
  Filled 2023-11-30 (×2): qty 2

## 2023-11-30 MED ORDER — METOCLOPRAMIDE HCL 5 MG/ML IJ SOLN
INTRAMUSCULAR | Status: AC
Start: 2023-11-30 — End: 2023-11-30
  Filled 2023-11-30: qty 2

## 2023-11-30 MED ORDER — LAMOTRIGINE 100 MG PO TABS
100.0000 mg | ORAL_TABLET | Freq: Two times a day (BID) | ORAL | Status: DC
  Administered 2023-11-30 – 2023-12-01 (×3): 100 mg
  Filled 2023-11-30 (×3): qty 1

## 2023-11-30 MED ORDER — LACTATED RINGERS IV SOLN: INTRAVENOUS | Status: DC

## 2023-11-30 MED ORDER — LACTATED RINGERS IV BOLUS
500.0000 mL | Freq: Once | INTRAVENOUS | Status: AC
  Administered 2023-11-30: 500 mL via INTRAVENOUS

## 2023-11-30 MED ORDER — POTASSIUM CHLORIDE 20 MEQ PO PACK
60.0000 meq | PACK | Freq: Once | ORAL | Status: AC
  Administered 2023-11-30: 60 meq
  Filled 2023-11-30: qty 3

## 2023-11-30 MED ORDER — SODIUM CHLORIDE 0.9 % IV SOLN
100.0000 mg | Freq: Once | INTRAVENOUS | Status: DC
  Filled 2023-11-30: qty 10

## 2023-11-30 MED ORDER — POTASSIUM CHLORIDE 20 MEQ PO PACK
40.0000 meq | PACK | Freq: Once | ORAL | Status: AC
  Administered 2023-11-30: 40 meq
  Filled 2023-11-30: qty 2

## 2023-11-30 MED ORDER — SODIUM CHLORIDE 0.9 % IV SOLN
100.0000 mg | Freq: Two times a day (BID) | INTRAVENOUS | Status: DC
  Administered 2023-11-30 – 2023-12-07 (×14): 100 mg via INTRAVENOUS
  Filled 2023-11-30 (×16): qty 10

## 2023-11-30 MED ORDER — LORAZEPAM 2 MG/ML IJ SOLN
2.0000 mg | INTRAMUSCULAR | Status: DC | PRN
  Administered 2023-12-05 – 2023-12-07 (×3): 2 mg via INTRAVENOUS
  Filled 2023-11-30 (×3): qty 1

## 2023-11-30 MED ORDER — SODIUM CHLORIDE 0.9 % IV SOLN: 200.0000 mg | Freq: Two times a day (BID) | INTRAVENOUS | Status: DC

## 2023-11-30 MED ORDER — METOCLOPRAMIDE HCL 5 MG/ML IJ SOLN
10.0000 mg | Freq: Once | INTRAMUSCULAR | Status: AC
  Administered 2023-11-30: 10 mg via INTRAVENOUS

## 2023-11-30 NOTE — Progress Notes (Addendum)
 NAME:  Debany Vantol, MRN:  [id number], DOB:  [birth year], LOS: 3 ADMISSION DATE:  11/27/2023, CONSULTATION DATE: 3/25 REFERRING MD: Dr. Charm Barges EDP, CHIEF COMPLAINT: Seizure  History of Present Illness:  53 year old female with past medical history as below, which is significant for seizure disorder followed at [facility] neurology, alcohol abuse reportedly sober for 1 year, hypertension, marijuana use, and hyperlipidemia.  Seizures are managed with Lamictal and recently added topamax with better control of seizures.  Last seen by neurology 07/19/2023.  Family reports she did have a mixup with her seizure medications a week prior to arrival.  She ran out of one of her medications and did not realize it.  This has been remedied and she is back taking both medications.  The patient was in her usual state of health until 3/25 when she was found down beside her bed by her family at approximately 7 PM.  Last known well around 2:30 PM.  Her mother found her and noticed her leg shaking and she was unresponsive.  EMS was called upon EMS arrival she was noted to have continuous full body seizure as well as vomiting.  She was treated with 10 mg of IM Versed with cessation of seizures.  She remained minimally responsive in the emergency department.  She was hypoxic requiring 10 L of oxygen.  Chest x-ray was unremarkable.  CT of the head and C-spine were unremarkable for acute issues with the exception of findings associated with chronic paranasal sinusitis.  Due to poor responsiveness PCCM was asked to evaluate for ICU admission.  Pertinent  Medical History   has a past medical history of Alcohol abuse, Alcohol withdrawal (HCC) (08/28/2018), High cholesterol, Hypertension, and Seizures (HCC).   Significant Hospital Events: Including procedures, antibiotic start and stop dates in addition to other pertinent events   3/25 admit to ICU. Intubated 3/26 LP performed. Increasing pressor requirement 3/27 Increased  vasopressor requirement with worsening MOD including AKI, shock liver. Echo with stress CM.  Interim History / Subjective:  Increased vasopressor requirement with worsening MOD including AKI, shock liver. Echo with stress CM. Discussed with family. Started on NAC. Cardiology and GI consulted Objective   Blood pressure 131/65, pulse 62, temperature 98.4 F (36.9 C), temperature source Axillary, resp. rate (!) 26, height 5' 2.01" (1.575 m), weight 64 kg, last menstrual period 08/28/2020, SpO2 100%. CVP:  [1 mmHg-14 mmHg] 11 mmHg  Vent Mode: PRVC FiO2 (%):  [40 %] 40 % Set Rate:  [24 bmp] 24 bmp Vt Set:  [440 mL] 440 mL PEEP:  [4 cmH20-5 cmH20] 5 cmH20 Plateau Pressure:  [10 cmH20-13 cmH20] 11 cmH20   Intake/Output Summary (Last 24 hours) at 11/30/2023 0727 Last data filed at 11/30/2023 0700 Gross per 24 hour  Intake 6114.38 ml  Output 1210 ml  Net 4904.38 ml   Filed Weights   11/28/23 0500 11/29/23 0500 11/30/23 0500  Weight: 64.7 kg 64 kg 64 kg   Physical Exam: General: Critically and chronically ill-appearing, sedated HENT: [place], AT, ETT in place Eyes: EOMI, no scleral icterus Respiratory: Clear to auscultation bilaterally.  No crackles, wheezing or rales Cardiovascular: RRR, -M/R/G, no JVD GI: BS+, soft, nontender Extremities:-Edema,-tenderness Neuro: Sedated, PERRL, CNII-XII grossly intact, +cough/gag GU: External foley in place  Imaging, labs and test noted above have been reviewed independently by me. Abd [place] neg for thrombosis, normal liver, mild ascites, small right pleural effusion LA 2.3, improved K 3.3.  BUN/Cr 42/3.26, slightly increased INR 2.9>2.3 AST 1902, increased ALT  1705, increased CK 16K>10K   Resolved Hospital Problem list     Assessment & Plan:   Status epilepticus: Home lamictal, topamax however recently off due to mix-up. CTA neg for LVO. CT spin no acute fx -Neurology consulted. S/p keppra and vimpat once overnight -AEDs per Neuro -Weaned off  Versed -LTM EEG with no definite seizures  -Lumbar puncture with neg culture to date. Meningitis/encephalitis panel neg -Abx de-escalated to Ceftriaxone  -Follow final cultures -Plan for MRI when off LTM  Acute respiratory failure with hypoxia:  Presumably aspiration pneumonia not yet showing on chest XR. PE in differential. Will hold off CTA due to recent contrast and AKI. LE dopplers neg for DVT -Full vent support -LTVV, 4-8cc/kg IBW with goal Pplat<30 and DP<15 -VAP and PAD protocol on Precedex for RASS goal -1 -SBT/WUA when able. Extubation precluded due to critical illness and pressor requirement  Septic shock +/- cardiogenic shock secondary aspiration. CNS infection ruled out Echo with newly depressed EF and LV WMA - ABX as above - Trend WBC and fever curve.  - Wean levophed and vasopressin for MAP >65 - Trend CVP, co-ox - S/p aggressive fluid resuscitation. 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 Consider inotropic support -Appreciate Cardiology input. Not a candidate for cath due to critical illness. Likely stress induced CM  AKI 2/2 ATN, CIN- increased Cr but fair/good UOP. Renal US normal Metabolic acidosis: resolving. nongap, lactic 2.7. Osmolar gap 20. Volatile panel neg. [facility] was contacted on 3/26. Ethanol <0.010 Methanol <0.010 Isoprop <0.010 Acetone <0.010 - Maintain renal perfusion - Trend UOP/Cr - Avoid nephrotoxic agents  Rhabdo/Elevated CK - improving - Continue mIVF  Transaminitis - increased Elevated INR - improving Abd [place] neg with doppler for thrombosis, normal liver, mild ascites, small right pleural effusion. Acute hepatitis panel  neg - Appreciate GI input. - NAC - Trend CMET, INR  History of ETOH abuse - reportedly sober for over a year - Thiamine, folate, MVI  Hypokalemia - Replete - Trend  Best Practice (right click and "Reselect all SmartList Selections" daily)   Diet/type: tubefeeds DVT prophylaxis other elevated INR Pressure ulcer(s): pressure ulcer assessment deferred  GI prophylaxis: H2B Lines: Central line Foley:  N/A Code Status:  full code Last date of multidisciplinary goals of care discussion [ ]   Updated family including mother on 3/28  Critical care time: 35 minutes    The patient is critically ill with multiple organ systems failure and requires high complexity decision making for assessment and support, frequent evaluation and titration of therapies, application of advanced monitoring technologies and extensive interpretation of multiple databases.  Independent Critical Care Time: 40 Minutes.   Mechele Collin, M.D. [facility] Pulmonary/Critical Care Medicine 11/30/2023 7:27 AM   Please see Amion for pager number to reach on-call Pulmonary and Critical Care Team.

## 2023-11-30 NOTE — Procedures (Addendum)
 Patient Name: Moncerrath Berhe  MRN: [id number]  Epilepsy Attending: Charlsie Quest  Referring Physician/Provider: Erick Blinks, MD  Duration: 11/30/2023 0239 to 12/01/2023 0630   Patient history: 53 y.o. female with hx of alcohol and substance use, Htn, seizures on lamotrigine and topamax who was found seizing for unknown duration. EEG to evaluate for seizure   Level of alertness:  lethargic    AEDs during EEG study: LEV, LTG, TPM, LCM, PHT, clonazepam   Technical aspects: This EEG study was done with scalp electrodes positioned according to the 10-20 International system of electrode placement. Electrical activity was reviewed with band pass filter of 1-70Hz , sensitivity of 7 uV/mm, display speed of 7mm/sec with a 60Hz  notched filter applied as appropriate. EEG data were recorded continuously and digitally stored.  Video monitoring was available and reviewed as appropriate.   Description: EEG showed continuous generalized and lateralized right hemisphere 3 to 6 Hz theta-delta slowing.  Lateralized periodic discharges are noted in right hemisphere every 3 to 5 seconds. Independent sharp waves were also noted in left hemisphere, at times periodic at 0.25-0.5Hz . Hyperventilation and photic stimulation were not performed.     EEG was disconnected between 11/30/2023 1107 to 1455 for MR brain.    ABNORMALITY - Lateralized periodic discharges, right hemisphere - Sharp waves, left hemisphere - Continuous slow, generalized and lateralized right hemisphere   IMPRESSION: This study showed evidence of epileptogenicity and cortical dysfunction arising from right hemisphere, maximal right temporal region with increased risk of seizure recurrence. Additionally there was also evidence of epileptogenicity arising from left hemisphere. No seizures were noted.  EEG appears to be improving compared to previous day.      Miyuki Rzasa Annabelle Harman

## 2023-11-30 NOTE — Progress Notes (Signed)
 [facility] ROUNDING NOTE   Subjective: Intubated and sedated.  Pressors coming down.    Objective: Vital signs in last 24 hours: Temp:  [98.2 F (36.8 C)-99.4 F (37.4 C)] 98.2 F (36.8 C) (03/28 0802) Pulse Rate:  [61-86] 66 (03/28 1000) Resp:  [15-27] 25 (03/28 1000) BP: (88-139)/(48-100) 113/85 (03/28 1000) SpO2:  [99 %-100 %] 100 % (03/28 1000) FiO2 (%):  [40 %] 40 % (03/28 0735) Weight:  [64 kg] 64 kg (03/28 0500) Last BM Date : 11/29/23 General: Intubated, sedated.  EEG leads in place Abdomen:  Soft, ND   Intake/Output from previous day: 03/27 0701 - 03/28 0700 In: 6114.4 [I.V.:3864.2; NG/GT:687.5; IV Piggyback:1562.7] Out: 1210 [Urine:1210] Intake/Output this shift: Total I/O In: 1251.5 [I.V.:387.9; NG/GT:150; IV Piggyback:713.6] Out: -    Lab Results: Recent Labs    11/28/23 0939 11/28/23 1148 11/29/23 0417 11/30/23 0507  WBC 15.6*  --  14.7* 11.4*  HGB 14.1 13.9 13.0 12.7  PLT 93*  --  62* 46*  MCV 85.1  --  83.4 81.3   BMET Recent Labs    11/28/23 2236 11/29/23 0417 11/30/23 0507  NA 134* 133* 133*  K 3.5 3.4* 3.3*  CL 103 101 101  CO2 19* 19* 18*  GLUCOSE 189* 206* 167*  BUN 35* 36* 42*  CREATININE 3.06* 3.16* 3.26*  CALCIUM 6.7* 6.8* 7.4*   LFT Recent Labs    11/28/23 0939 11/29/23 0843 11/30/23 0507  PROT 4.8* 4.2* 4.2*  ALBUMIN 3.1* 2.5* 2.2*  AST 652* 1,349* 1,902*  ALT 279* 840* 1,705*  ALKPHOS 74 70 62  BILITOT 1.5* 1.9* 2.3*  BILIDIR  --  1.0*  --   IBILI  --  0.9  --    PT/INR Recent Labs    11/29/23 0417 11/30/23 0507  INR 2.9* 2.3*      Imaging/Other results: US ABDOMEN LIMITED WITH LIVER DOPPLER Result Date: 11/30/2023 CLINICAL DATA:  Elevated liver function studies. History of Budd-Chiari syndrome. EXAM: DUPLEX ULTRASOUND OF LIVER TECHNIQUE: Color and duplex Doppler ultrasound was performed to evaluate the hepatic in-flow and out-flow vessels. COMPARISON:  Ultrasound right upper quadrant  03/02/2021 FINDINGS: Liver: Normal parenchymal echogenicity. Normal hepatic contour without nodularity. No focal lesion, mass or intrahepatic biliary ductal dilatation. Main Portal Vein size: 0.9 cm Portal Vein Velocities Main Prox:  21 cm/sec Main Mid: 17.6 cm/sec Main Dist:  15.5 cm/sec Right: 14.5 cm/sec Left: 10.5 cm/sec Hepatic Vein Velocities Right:  9.9 cm/sec Middle:  14.9 cm/sec Left:  12.9 cm/sec IVC: Present and patent with normal respiratory phasicity. Hepatic Artery Velocity:  91.4 cm/sec Splenic Vein Velocity:  7.1 cm/sec Spleen: 10.8 cm x 6.6 cm x 4.5 cm with a total volume of 167 cm^3 (411 cm^3 is upper limit normal) Portal Vein Occlusion/Thrombus: No Splenic Vein Occlusion/Thrombus: No Ascites: Mild upper abdominal ascites demonstrated. Varices: None Incidental note of a right pleural effusion. Portal venous and hepatic venous flow is in the appropriate direction. IMPRESSION: 1. Patent portal and hepatic veins with appropriate flow direction. No venous thrombosis identified. 2. Normal appearance of the liver. 3. Mild abdominal ascites.  Small right pleural effusion. Electronically Signed   By: Burman Nieves M.D.   On: 11/30/2023 00:46   DG CHEST PORT 1 VIEW Result Date: 11/28/2023 CLINICAL DATA:  [id number] Encounter for central line placement [id number] EXAM: PORTABLE CHEST 1 VIEW COMPARISON:  11/28/2023 FINDINGS: Endotracheal tube is 3.9 cm above the carina. Nasogastric tube is in the left upper abdomen and probably in  the proximal stomach. Left subclavian central line tip in the upper SVC. Negative for a pneumothorax. Both lungs are clear. Heart and mediastinum are within normal limits. IMPRESSION: 1. Left subclavian central line tip is in the upper SVC. Negative for a pneumothorax. 2. Endotracheal tube is in good position. 3. No focal lung disease. Electronically Signed   By: Richarda Overlie M.D.   On: 11/28/2023 15:04   ECHOCARDIOGRAM COMPLETE Result Date: 11/28/2023    ECHOCARDIOGRAM REPORT    Patient Name:   DELANCEY MORAES Date of Exam: 11/28/2023 Medical Rec #:  [id number]           Height:       62.0 in Accession #:    [id number]          Weight:       142.6 lb Date of Birth:  October 16, 1970            BSA:          1.656 m Patient Age:    53 years            BP:           85/67 mmHg Patient Gender: F                   HR:           65 bpm. Exam Location:  Inpatient Procedure: 2D Echo, Color Doppler and Cardiac Doppler (Both Spectral and Color            Flow Doppler were utilized during procedure). Indications:    R06.9 DOE  History:        Patient has no prior history of Echocardiogram examinations.                 Risk Factors:Hypertension and Dyslipidemia.  Sonographer:    Irving Burton Senior RDCS Referring Phys: [phone] [facility]  Sonographer Comments: Scanned supine on artificial respirator IMPRESSIONS  1. Left ventricular ejection fraction, by estimation, is 40 to 45%. The left ventricle has mildly decreased function. The left ventricle demonstrates regional wall motion abnormalities (see scoring diagram/findings for description). Left ventricular diastolic parameters are consistent with Grade I diastolic dysfunction (impaired relaxation).  2. Right ventricular systolic function is normal. The right ventricular size is normal.  3. A small pericardial effusion is present. The pericardial effusion is circumferential. There is no evidence of cardiac tamponade.  4. The mitral valve is normal in structure. Trivial mitral valve regurgitation.  5. The aortic valve is tricuspid. Aortic valve regurgitation is not visualized. FINDINGS  Left Ventricle: Left ventricular ejection fraction, by estimation, is 40 to 45%. The left ventricle has mildly decreased function. The left ventricle demonstrates regional wall motion abnormalities. The left ventricular internal cavity size was normal in size. There is no left ventricular hypertrophy. Left ventricular diastolic parameters are consistent with Grade I diastolic  dysfunction (impaired relaxation).  LV Wall Scoring: The basal anteroseptal segment, basal inferolateral segment, basal anterolateral segment, basal anterior segment, basal inferior segment, and basal inferoseptal segment are hypokinetic. Right Ventricle: The right ventricular size is normal. No increase in right ventricular wall thickness. Right ventricular systolic function is normal. Left Atrium: Left atrial size was normal in size. Right Atrium: Right atrial size was normal in size. Pericardium: A small pericardial effusion is present. The pericardial effusion is circumferential. There is no evidence of cardiac tamponade. Mitral Valve: The mitral valve is normal in structure. Trivial mitral valve regurgitation. Tricuspid Valve: The tricuspid  valve is normal in structure. Tricuspid valve regurgitation is mild. Aortic Valve: The aortic valve is tricuspid. Aortic valve regurgitation is not visualized. Pulmonic Valve: The pulmonic valve was normal in structure. Pulmonic valve regurgitation is trivial. Aorta: The aortic root is normal in size and structure and the ascending aorta was not well visualized. Venous: IVC assessment for right atrial pressure unable to be performed due to mechanical ventilation. IAS/Shunts: No atrial level shunt detected by color flow Doppler.  LEFT VENTRICLE PLAX 2D LVIDd:         4.40 cm     Diastology LVIDs:         3.90 cm     LV e' medial:    4.57 cm/s LV PW:         1.00 cm     LV E/e' medial:  12.5 LV IVS:        0.60 cm     LV e' lateral:   5.33 cm/s LVOT diam:     2.00 cm     LV E/e' lateral: 10.7 LV SV:         55 LV SV Index:   33 LVOT Area:     3.14 cm  LV Volumes (MOD) LV vol d, MOD A2C: 49.9 ml LV vol d, MOD A4C: 64.3 ml LV vol s, MOD A2C: 26.2 ml LV vol s, MOD A4C: 30.4 ml LV SV MOD A2C:     23.7 ml LV SV MOD A4C:     64.3 ml LV SV MOD BP:      26.8 ml RIGHT VENTRICLE RV S prime:     9.79 cm/s TAPSE (M-mode): 1.9 cm LEFT ATRIUM             Index        RIGHT ATRIUM            Index LA diam:        2.80 cm 1.69 cm/m   RA Area:     13.30 cm LA Vol (A2C):   36.0 ml 21.74 ml/m  RA Volume:   34.10 ml  20.59 ml/m LA Vol (A4C):   34.5 ml 20.83 ml/m LA Biplane Vol: 36.5 ml 22.04 ml/m  AORTIC VALVE LVOT Vmax:   92.00 cm/s LVOT Vmean:  63.900 cm/s LVOT VTI:    0.174 m  AORTA Ao Root diam: 3.00 cm MITRAL VALVE               TRICUSPID VALVE MV Area (PHT): 3.91 cm    TR Peak grad:   17.6 mmHg MV Decel Time: 194 msec    TR Vmax:        210.00 cm/s MV E velocity: 56.90 cm/s MV A velocity: 66.30 cm/s  SHUNTS MV E/A ratio:  0.86        Systemic VTI:  0.17 m                            Systemic Diam: 2.00 cm Donato Schultz MD Electronically signed by Donato Schultz MD Signature Date/Time: 11/28/2023/2:01:15 PM    Final       Assessment and Plan:  1) Elevated liver enzymes (shock liver) 2) Coagulopathy 3) AKI 4) Status epilepticus 5) Metabolic acidosis 6) Rhabdomyolysis 7) New onset cardiomyopathy with reduced EF 8) Acute respiratory failure with hypoxia 9) Thrombocytopenia 10) Leukocytosis-improving  Liver enzymes uptrending today with AST/ALT 1902/1705 with T. bili 2.3.  This is not terribly unexpected  and compatible with delayed elevation in liver enzymes in the setting of shock liver 2/2 global hypoperfusion (ischemic hepatitis) and rhabdomyolysis/severe systemic event.  INR improved, now 2.3 (from 2.9) and lactate downtrending.  CK downtrending and improved vasopressor support.  RUQ ultrasound with patent portal and hepatic veins, otherwise normal-appearing liver, no duct dilation.  Mild ascites.  - Continue NAC - Continue trending liver enzymes and INR daily - Continue max supportive care as currently doing - GI service will continue to follow peripherally through the weekend.  Dr. Elnoria Howard will be covering the inpatient GI service through the weekend.  Please do not hesitate to reach out with additional questions or concerns.  Otherwise, GI service will plan on seeing again on  Monday.    Shellia Cleverly, DO  11/30/2023, 10:22 AM [facility] Pager [phone]

## 2023-11-30 NOTE — Progress Notes (Signed)
 Transported pt from [id number] to MRI and back with bedside RN. Vitals are stable.

## 2023-11-30 NOTE — Procedures (Signed)
 Cortrak  Tube Type:  Cortrak - 43 inches Tube Location:  Left nare Initial Placement:  Stomach Secured by: Bridle Technique Used to Measure Tube Placement:  Marking at nare/corner of mouth Cortrak Secured At:  70 cm   Cortrak Tube Team Note:  Consult received to place a Cortrak feeding tube.   No x-ray is required. RN may begin using tube.   If the tube becomes dislodged please keep the tube and contact the Cortrak team at [email or web address] for replacement.  If after hours and replacement cannot be delayed, place a NG tube and confirm placement with an abdominal x-ray.    Betsey Holiday MS, RD, LDN If unable to be reached, please send secure chat to "RD inpatient" available from 8:00a-4:00p daily

## 2023-11-30 NOTE — Progress Notes (Signed)
 Rounding Note    Patient Name: Meredith Park Date of Encounter: 11/30/2023  [facility] Cardiologist: None   Subjective   Remains on pressors but improved from yesterday, on levophed at 22mcg/min and vaso.  CVP 13-14.  Remains intubaed and sedated.  FiO2 40%, PEEP 5  Inpatient Medications    Scheduled Meds:  Chlorhexidine Gluconate Cloth  6 each Topical Daily   clonazePAM  2 mg Per Tube BID   docusate  100 mg Per Tube BID   famotidine  20 mg Per Tube Daily   feeding supplement (PROSource TF20)  60 mL Per Tube Daily   folic acid  1 mg Per Tube Daily   insulin aspart  0-9 Units Subcutaneous Q4H   lamoTRIgine  25 mg Per Tube BID   multivitamin with minerals  1 tablet Per Tube Daily   mouth rinse  15 mL Mouth Rinse Q2H   polyethylene glycol  17 g Per Tube Daily   [START ON 12/06/2023] thiamine (VITAMIN B1) injection  100 mg Intravenous Daily   topiramate  25 mg Per Tube QHS   Continuous Infusions:  acetylcysteine 6.25 mg/kg/hr (11/30/23 0700)   cefTRIAXone (ROCEPHIN)  IV     dexmedetomidine (PRECEDEX) IV infusion 0.6 mcg/kg/hr (11/30/23 0700)   feeding supplement (OSMOLITE 1.5 CAL) 50 mL/hr at 11/30/23 0700   lacosamide (VIMPAT) IV Stopped (11/29/23 2334)   lactated ringers 75 mL/hr at 11/30/23 0755   levETIRAcetam Stopped (11/29/23 2250)   norepinephrine (LEVOPHED) Adult infusion 12 mcg/min (11/30/23 0841)   thiamine (VITAMIN B1) injection     vasopressin 0.03 Units/min (11/30/23 0700)   PRN Meds: acetaminophen, docusate sodium, fentaNYL (SUBLIMAZE) injection, fentaNYL (SUBLIMAZE) injection, ipratropium-albuterol, mouth rinse, polyethylene glycol   Vital Signs    Vitals:   11/30/23 0500 11/30/23 0600 11/30/23 0700 11/30/23 0802  BP: (!) 126/52 (!) 136/48 131/65   Pulse: 65 66 62   Resp: (!) 26 (!) 27 (!) 26   Temp:    98.2 F (36.8 C)  TempSrc:    Axillary  SpO2: 100% 100% 100%   Weight: 64 kg     Height:        Intake/Output Summary (Last 24  hours) at 11/30/2023 0917 Last data filed at 11/30/2023 0700 Gross per 24 hour  Intake 5858.11 ml  Output 1210 ml  Net 4648.11 ml      11/30/2023    5:00 AM 11/29/2023    5:00 AM 11/28/2023    5:00 AM  Last 3 Weights  Weight (lbs) 141 lb 1.5 oz 141 lb 1.5 oz 142 lb 10.2 oz  Weight (kg) 64 kg 64 kg 64.7 kg      Telemetry    NSR - Personally Reviewed  ECG    No new ECG - Personally Reviewed  Physical Exam   GEN: intubated, sedated Neck: L subclavain CVC Cardiac: RRR, no murmurs Respiratory: Mechanical breath sounds GI: Soft MS: No edema Neuro:  Not answering questions or following commands Psych: Unable to assess  Labs    High Sensitivity Troponin:   Recent Labs  Lab 11/29/23 0843  TROPONINIHS >24,000*     Chemistry Recent Labs  Lab 11/28/23 0939 11/28/23 1148 11/28/23 2236 11/29/23 0417 11/29/23 0843 11/29/23 1640 11/30/23 0507  NA 141   < > 134* 133*  --   --  133*  K 2.6*   < > 3.5 3.4*  --   --  3.3*  CL 109  --  103 101  --   --  101  CO2 17*  --  19* 19*  --   --  18*  GLUCOSE 103*  --  189* 206*  --   --  167*  BUN 27*  --  35* 36*  --   --  42*  CREATININE 2.80*  --  3.06* 3.16*  --   --  3.26*  CALCIUM 7.8*  --  6.7* 6.8*  --   --  7.4*  MG  --   --  1.8 1.9  --  2.4  --   PROT 4.8*  --   --   --  4.2*  --  4.2*  ALBUMIN 3.1*  --   --   --  2.5*  --  2.2*  AST 652*  --   --   --  1,349*  --  1,902*  ALT 279*  --   --   --  840*  --  1,705*  ALKPHOS 74  --   --   --  70  --  62  BILITOT 1.5*  --   --   --  1.9*  --  2.3*  GFRNONAA 20*  --  18* 17*  --   --  16*  ANIONGAP 15  --  12 13  --   --  14   < > = values in this interval not displayed.    Lipids No results for input(s): "CHOL", "TRIG", "HDL", "LABVLDL", "LDLCALC", "CHOLHDL" in the last 168 hours.  Hematology Recent Labs  Lab 11/28/23 0939 11/28/23 1148 11/29/23 0417 11/30/23 0507  WBC 15.6*  --  14.7* 11.4*  RBC 5.10  --  4.81 4.64  HGB 14.1 13.9 13.0 12.7  HCT 43.4 41.0  40.1 37.7  MCV 85.1  --  83.4 81.3  MCH 27.6  --  27.0 27.4  MCHC 32.5  --  32.4 33.7  RDW 15.5  --  15.8* 15.9*  PLT 93*  --  62* 46*   Thyroid  Recent Labs  Lab 11/28/23 2236  TSH 2.521  FREET4 0.78    BNPNo results for input(s): "BNP", "PROBNP" in the last 168 hours.  DDimer No results for input(s): "DDIMER" in the last 168 hours.   Radiology    US ABDOMEN LIMITED WITH LIVER DOPPLER Result Date: 11/30/2023 CLINICAL DATA:  Elevated liver function studies. History of Budd-Chiari syndrome. EXAM: DUPLEX ULTRASOUND OF LIVER TECHNIQUE: Color and duplex Doppler ultrasound was performed to evaluate the hepatic in-flow and out-flow vessels. COMPARISON:  Ultrasound right upper quadrant 03/02/2021 FINDINGS: Liver: Normal parenchymal echogenicity. Normal hepatic contour without nodularity. No focal lesion, mass or intrahepatic biliary ductal dilatation. Main Portal Vein size: 0.9 cm Portal Vein Velocities Main Prox:  21 cm/sec Main Mid: 17.6 cm/sec Main Dist:  15.5 cm/sec Right: 14.5 cm/sec Left: 10.5 cm/sec Hepatic Vein Velocities Right:  9.9 cm/sec Middle:  14.9 cm/sec Left:  12.9 cm/sec IVC: Present and patent with normal respiratory phasicity. Hepatic Artery Velocity:  91.4 cm/sec Splenic Vein Velocity:  7.1 cm/sec Spleen: 10.8 cm x 6.6 cm x 4.5 cm with a total volume of 167 cm^3 (411 cm^3 is upper limit normal) Portal Vein Occlusion/Thrombus: No Splenic Vein Occlusion/Thrombus: No Ascites: Mild upper abdominal ascites demonstrated. Varices: None Incidental note of a right pleural effusion. Portal venous and hepatic venous flow is in the appropriate direction. IMPRESSION: 1. Patent portal and hepatic veins with appropriate flow direction. No venous thrombosis identified. 2. Normal appearance of the liver. 3. Mild abdominal ascites.  Small  right pleural effusion. Electronically Signed   By: Burman Nieves M.D.   On: 11/30/2023 00:46   VAS [place] LOWER EXTREMITY VENOUS (DVT) Result Date: 11/28/2023   Lower Venous DVT Study Patient Name:  CHERRYL BABIN  Date of Exam:   11/28/2023 Medical Rec #: [id number]            Accession #:    [id number] Date of Birth: Nov 05, 1970             Patient Gender: F Patient Age:   53 years Exam Location:  [facility] Procedure:      VAS [place] LOWER EXTREMITY VENOUS (DVT) Referring Phys: Renae Fickle HOFFMAN --------------------------------------------------------------------------------  Indications: Hypoxia.  Risk Factors: None identified. Comparison Study: No prior studies. Performing Technologist: Chanda Busing RVT  Examination Guidelines: A complete evaluation includes B-mode imaging, spectral Doppler, color Doppler, and power Doppler as needed of all accessible portions of each vessel. Bilateral testing is considered an integral part of a complete examination. Limited examinations for reoccurring indications may be performed as noted. The reflux portion of the exam is performed with the patient in reverse Trendelenburg.  +---------+---------------+---------+-----------+----------+--------------+ RIGHT    CompressibilityPhasicitySpontaneityPropertiesThrombus Aging +---------+---------------+---------+-----------+----------+--------------+ CFV      Full           Yes      Yes                                 +---------+---------------+---------+-----------+----------+--------------+ SFJ      Full                                                        +---------+---------------+---------+-----------+----------+--------------+ FV Prox  Full                                                        +---------+---------------+---------+-----------+----------+--------------+ FV Mid   Full                                                        +---------+---------------+---------+-----------+----------+--------------+ FV DistalFull                                                         +---------+---------------+---------+-----------+----------+--------------+ PFV      Full                                                        +---------+---------------+---------+-----------+----------+--------------+ POP      Full           Yes      Yes                                 +---------+---------------+---------+-----------+----------+--------------+  PTV      Full                                                        +---------+---------------+---------+-----------+----------+--------------+ PERO     Full                                                        +---------+---------------+---------+-----------+----------+--------------+   +---------+---------------+---------+-----------+----------+--------------+ LEFT     CompressibilityPhasicitySpontaneityPropertiesThrombus Aging +---------+---------------+---------+-----------+----------+--------------+ CFV      Full           Yes      Yes                                 +---------+---------------+---------+-----------+----------+--------------+ SFJ      Full                                                        +---------+---------------+---------+-----------+----------+--------------+ FV Prox  Full                                                        +---------+---------------+---------+-----------+----------+--------------+ FV Mid   Full                                                        +---------+---------------+---------+-----------+----------+--------------+ FV DistalFull                                                        +---------+---------------+---------+-----------+----------+--------------+ PFV      Full                                                        +---------+---------------+---------+-----------+----------+--------------+ POP      Full           Yes      Yes                                  +---------+---------------+---------+-----------+----------+--------------+ PTV      Full                                                        +---------+---------------+---------+-----------+----------+--------------+  PERO     Full                                                        +---------+---------------+---------+-----------+----------+--------------+     Summary: RIGHT: - There is no evidence of deep vein thrombosis in the lower extremity.  - No cystic structure found in the popliteal fossa.  LEFT: - There is no evidence of deep vein thrombosis in the lower extremity.  - No cystic structure found in the popliteal fossa.  *See table(s) above for measurements and observations. Electronically signed by Coral Else MD on 11/28/2023 at 10:01:08 PM.    Final    DG CHEST PORT 1 VIEW Result Date: 11/28/2023 CLINICAL DATA:  [id number] Encounter for central line placement [id number] EXAM: PORTABLE CHEST 1 VIEW COMPARISON:  11/28/2023 FINDINGS: Endotracheal tube is 3.9 cm above the carina. Nasogastric tube is in the left upper abdomen and probably in the proximal stomach. Left subclavian central line tip in the upper SVC. Negative for a pneumothorax. Both lungs are clear. Heart and mediastinum are within normal limits. IMPRESSION: 1. Left subclavian central line tip is in the upper SVC. Negative for a pneumothorax. 2. Endotracheal tube is in good position. 3. No focal lung disease. Electronically Signed   By: Richarda Overlie M.D.   On: 11/28/2023 15:04   ECHOCARDIOGRAM COMPLETE Result Date: 11/28/2023    ECHOCARDIOGRAM REPORT   Patient Name:   MAKYLAH BOSSARD Date of Exam: 11/28/2023 Medical Rec #:  [id number]           Height:       62.0 in Accession #:    [id number]          Weight:       142.6 lb Date of Birth:  1970-09-28            BSA:          1.656 m Patient Age:    53 years            BP:           85/67 mmHg Patient Gender: F                   HR:           65 bpm. Exam Location:  Inpatient  Procedure: 2D Echo, Color Doppler and Cardiac Doppler (Both Spectral and Color            Flow Doppler were utilized during procedure). Indications:    R06.9 DOE  History:        Patient has no prior history of Echocardiogram examinations.                 Risk Factors:Hypertension and Dyslipidemia.  Sonographer:    Irving Burton Senior RDCS Referring Phys: [phone] [facility]  Sonographer Comments: Scanned supine on artificial respirator IMPRESSIONS  1. Left ventricular ejection fraction, by estimation, is 40 to 45%. The left ventricle has mildly decreased function. The left ventricle demonstrates regional wall motion abnormalities (see scoring diagram/findings for description). Left ventricular diastolic parameters are consistent with Grade I diastolic dysfunction (impaired relaxation).  2. Right ventricular systolic function is normal. The right ventricular size is normal.  3. A small pericardial effusion is present. The pericardial effusion is circumferential. There is no evidence of  cardiac tamponade.  4. The mitral valve is normal in structure. Trivial mitral valve regurgitation.  5. The aortic valve is tricuspid. Aortic valve regurgitation is not visualized. FINDINGS  Left Ventricle: Left ventricular ejection fraction, by estimation, is 40 to 45%. The left ventricle has mildly decreased function. The left ventricle demonstrates regional wall motion abnormalities. The left ventricular internal cavity size was normal in size. There is no left ventricular hypertrophy. Left ventricular diastolic parameters are consistent with Grade I diastolic dysfunction (impaired relaxation).  LV Wall Scoring: The basal anteroseptal segment, basal inferolateral segment, basal anterolateral segment, basal anterior segment, basal inferior segment, and basal inferoseptal segment are hypokinetic. Right Ventricle: The right ventricular size is normal. No increase in right ventricular wall thickness. Right ventricular systolic function is  normal. Left Atrium: Left atrial size was normal in size. Right Atrium: Right atrial size was normal in size. Pericardium: A small pericardial effusion is present. The pericardial effusion is circumferential. There is no evidence of cardiac tamponade. Mitral Valve: The mitral valve is normal in structure. Trivial mitral valve regurgitation. Tricuspid Valve: The tricuspid valve is normal in structure. Tricuspid valve regurgitation is mild. Aortic Valve: The aortic valve is tricuspid. Aortic valve regurgitation is not visualized. Pulmonic Valve: The pulmonic valve was normal in structure. Pulmonic valve regurgitation is trivial. Aorta: The aortic root is normal in size and structure and the ascending aorta was not well visualized. Venous: IVC assessment for right atrial pressure unable to be performed due to mechanical ventilation. IAS/Shunts: No atrial level shunt detected by color flow Doppler.  LEFT VENTRICLE PLAX 2D LVIDd:         4.40 cm     Diastology LVIDs:         3.90 cm     LV e' medial:    4.57 cm/s LV PW:         1.00 cm     LV E/e' medial:  12.5 LV IVS:        0.60 cm     LV e' lateral:   5.33 cm/s LVOT diam:     2.00 cm     LV E/e' lateral: 10.7 LV SV:         55 LV SV Index:   33 LVOT Area:     3.14 cm  LV Volumes (MOD) LV vol d, MOD A2C: 49.9 ml LV vol d, MOD A4C: 64.3 ml LV vol s, MOD A2C: 26.2 ml LV vol s, MOD A4C: 30.4 ml LV SV MOD A2C:     23.7 ml LV SV MOD A4C:     64.3 ml LV SV MOD BP:      26.8 ml RIGHT VENTRICLE RV S prime:     9.79 cm/s TAPSE (M-mode): 1.9 cm LEFT ATRIUM             Index        RIGHT ATRIUM           Index LA diam:        2.80 cm 1.69 cm/m   RA Area:     13.30 cm LA Vol (A2C):   36.0 ml 21.74 ml/m  RA Volume:   34.10 ml  20.59 ml/m LA Vol (A4C):   34.5 ml 20.83 ml/m LA Biplane Vol: 36.5 ml 22.04 ml/m  AORTIC VALVE LVOT Vmax:   92.00 cm/s LVOT Vmean:  63.900 cm/s LVOT VTI:    0.174 m  AORTA Ao Root diam: 3.00 cm MITRAL VALVE  TRICUSPID VALVE MV Area (PHT):  3.91 cm    TR Peak grad:   17.6 mmHg MV Decel Time: 194 msec    TR Vmax:        210.00 cm/s MV E velocity: 56.90 cm/s MV A velocity: 66.30 cm/s  SHUNTS MV E/A ratio:  0.86        Systemic VTI:  0.17 m                            Systemic Diam: 2.00 cm Donato Schultz MD Electronically signed by Donato Schultz MD Signature Date/Time: 11/28/2023/2:01:15 PM    Final     Cardiac Studies     Patient Profile     53 y.o. female  with a hx of seizure, alcohol abuse, hypertension, marijuana use, hyperlipidemia who is being seen 11/29/2023 for the evaluation of abnormal echo   Assessment & Plan    New cardiomyopathy HFmrEF -- echo shows LVEF of 40-45% with grade 1 diastolic dysfunction, small pericardial effusion, basal anteroseptal, inferolateral hypokinesis. No prior echo reports found -- she does not appear volume overloaded on exam, warm and dry extremities -- CXR with no pulmonary edema noted -- unclear etiology of drop in EF but suspect secondary severe acute illness  -- no room for GDMT at this time with need for pressor support/shock   Status epilepticus Rhabdomyolysis  -- follows with [facility] neurology -- EEG in process -- on keppra, lacosamide, clonazepam, lamotrigine and topmax -- CK markedly elevated, has been receiving IVFs    NSTEMI -- of note, hsTn this morning resulted >24000, though again the in setting of severely elevated [place] of 29562 -- as noted above, suspect this may be secondary to her acute state/severe rhabdomyolysis in setting of seizures  -- EKG with avR elevation and diffuse ST depressions, suggesting global ischemia.  Not currently a candidate for ischemic evaluation   Acute respiratory failure with hypoxia Possible aspiration pneumonia Septic shock -- intubated on admission, PCCM managing --on precedex, antibiotics, IV pressor support with levo and vasopressin  -- workup for meningitis, thus far cultures negative  -- Coox high, do not suspect cardiogenic shock    Transaminitis -- AST 297>>652>>[id number]>>1902, ALT 185>>279>>840>>1705 -- in setting of septic shock -- GI consulted   AKI -- Cr 2.46>>2.8>>3.06>>3.16 >>3.26 -- can continue IVFs as tolerated, monitor CVP (can maintain 12-15)  For questions or updates, please contact [facility] Please consult [email or web address] for contact info under     CRITICAL CARE TIME: I have spent a total of 32 minutes with patient reviewing hospital notes, telemetry, EKGs, labs and examining the patient as well as establishing an assessment and plan.  > 50% of time was spent in direct patient care. The patient is critically ill with multi-organ system failure and requires high complexity decision making for assessment and support, frequent evaluation and titration of therapies, application of advanced monitoring technologies and extensive interpretation of multiple databases.     Signed, Little Ishikawa, MD  11/30/2023, 9:17 AM

## 2023-11-30 NOTE — Progress Notes (Addendum)
 NEUROLOGY CONSULT FOLLOW UP NOTE    Date of service: November 30, 2023 Patient Name: Meredith Park MRN:  [id number] DOB:  [birth year]  Meredith Park is a 53 y.o. female with hx of alcohol and substance use, Htn, seizures on lamotrigine and topamax who was found seizing for unknown duration.  She was given Versed in the field with resolution of clinical seizure activity noted.  Obtunded mentation for several hours after arrival with no significant improvement.  CT head without contrast with no acute intracranial abnormality.  CT angio of the head and neck with no basilar thrombosis or LVO. CK slightly improved today, renal and liver function worsening. CSF glucose 71, protein 74, white blood cells 3, meningitis/encephalitis panel negative.   Home AEDs: Lamotrigine 200 mg BID Topiramate 100 daily   Interval Hx/subjective   Current AEDs:  Keppra 500mg  BID Vimpat 200mg  BID Klonopin 2 mg BID S/p one time fospheny load on 3/27 which resolved frequent seizures in addition to increasing klonopin that day Lamotrigine 100 mg BID Topiramate 50 QHS  Vitals   Vitals:   11/30/23 0802 11/30/23 0900 11/30/23 1000 11/30/23 1154  BP:  132/61 113/85   Pulse:  61 66   Resp:  (!) 26 (!) 25   Temp: 98.2 F (36.8 C)   98.6 F (37 C)  TempSrc: Axillary   Axillary  SpO2:  100% 100%   Weight:      Height:         Body mass index is 25.8 kg/m.  Physical Exam   Constitutional: Ill appearing  Eyes: No scleral injection.  HENT: No OP obstruction. Residual blood in mouth Cardiovascular: Normal rate and regular rhythm.  Respiratory: Mechanically ventilated, breathing over the vent  Neurologic Examination   Neuro: Precedex 0.1 turned off for exam, no fentanyl  Mental Status: Intubated, unable to follow commands. With trapezius squeeze + loud voice partially opens eyes to voice Cranial Nerves: II: Pupils are equal, round, and reactive to light.  Corneal sluggish (slightly weaker on the  left than the right).  III, IV, VI: VOR not tested today VIII: Slight eye opening to voice X: Cough strong Motor/Sensory: Minimal movement to noxious stim throughout extremities   Medications  Current Facility-Administered Medications:    acetaminophen (TYLENOL) tablet 650 mg, 650 mg, Per Tube, Q6H PRN, Luciano Cutter, MD   [COMPLETED] acetylcysteine (ACETADOTE) 30.5 mg/mL load via infusion 9,600 mg, 150 mg/kg, Intravenous, Once, 9,600 mg at 11/29/23 1340 **FOLLOWED BY** [EXPIRED] acetylcysteine (ACETADOTE) 18,000 mg in dextrose 5 % 590 mL (30.5085 mg/mL) infusion, 12.5 mg/kg/hr, Intravenous, Continuous, Stopped at 11/29/23 1832 **FOLLOWED BY** acetylcysteine (ACETADOTE) 18,000 mg in dextrose 5 % 590 mL (30.5085 mg/mL) infusion, 6.25 mg/kg/hr, Intravenous, Continuous, Luciano Cutter, MD, Last Rate: 13.11 mL/hr at 11/30/23 1000, 6.25 mg/kg/hr at 11/30/23 1000   cefTRIAXone (ROCEPHIN) 2 g in sodium chloride 0.9 % 100 mL IVPB, 2 g, Intravenous, Q24H, Luciano Cutter, MD, Last Rate: 200 mL/hr at 11/30/23 1000, Infusion Verify at 11/30/23 1000   Chlorhexidine Gluconate Cloth 2 % PADS 6 each, 6 each, Topical, Daily, Duayne Cal, NP, 6 each at 11/29/23 2223   clonazePAM (KLONOPIN) tablet 2 mg, 2 mg, Per Tube, BID, Florice Hindle L, MD, 2 mg at 11/30/23 0926   dexmedetomidine (PRECEDEX) 400 MCG/100ML (4 mcg/mL) infusion, 0-1.2 mcg/kg/hr, Intravenous, Titrated, Luciano Cutter, MD, Last Rate: 6.47 mL/hr at 11/30/23 1000, 0.4 mcg/kg/hr at 11/30/23 1000   docusate (COLACE) 50 MG/5ML liquid 100 mg,  100 mg, Per Tube, BID, Duayne Cal, NP, 100 mg at 11/30/23 1610   docusate sodium (COLACE) capsule 100 mg, 100 mg, Oral, BID PRN, Reita May, Rutwij, MD   famotidine (PEPCID) tablet 20 mg, 20 mg, Per Tube, Daily, Calton Dach I, RPH, 20 mg at 11/30/23 9604   feeding supplement (OSMOLITE 1.5 CAL) liquid 1,000 mL, 1,000 mL, Per Tube, Continuous, Luciano Cutter, MD, Last Rate: 50 mL/hr at  11/30/23 1000, Infusion Verify at 11/30/23 1000   feeding supplement (PROSource TF20) liquid 60 mL, 60 mL, Per Tube, Daily, Luciano Cutter, MD, 60 mL at 11/30/23 0925   fentaNYL (SUBLIMAZE) injection 50 mcg, 50 mcg, Intravenous, Q15 min PRN, Duayne Cal, NP   fentaNYL (SUBLIMAZE) injection 50-200 mcg, 50-200 mcg, Intravenous, Q30 min PRN, Duayne Cal, NP   folic acid (FOLVITE) tablet 1 mg, 1 mg, Per Tube, Daily, Luciano Cutter, MD, 1 mg at 11/30/23 0925   insulin aspart (novoLOG) injection 0-9 Units, 0-9 Units, Subcutaneous, Q4H, Luciano Cutter, MD, 1 Units at 11/30/23 0743   ipratropium-albuterol (DUONEB) 0.5-2.5 (3) MG/3ML nebulizer solution 3 mL, 3 mL, Nebulization, Q6H PRN, Reita May, Rutwij, MD   lacosamide (VIMPAT) 100 mg in sodium chloride 0.9 % 25 mL IVPB, 100 mg, Intravenous, Once, Teresia Myint L, MD   [START ON 12/01/2023] lacosamide (VIMPAT) 200 mg in sodium chloride 0.9 % 25 mL IVPB, 200 mg, Intravenous, Q12H, Kamilia Carollo L, MD   lactated ringers infusion, , Intravenous, Continuous, Luciano Cutter, MD, Last Rate: 75 mL/hr at 11/30/23 1000, Infusion Verify at 11/30/23 1000   lamoTRIgine (LAMICTAL) tablet 25 mg, 25 mg, Per Tube, BID, Calton Dach I, RPH, 25 mg at 11/30/23 5409   levETIRAcetam (KEPPRA) IVPB 500 mg/100 mL premix, 500 mg, Intravenous, Q12H, Veron Senner L, MD, Stopped at 11/30/23 8119   multivitamin with minerals tablet 1 tablet, 1 tablet, Per Tube, Daily, Calton Dach I, RPH, 1 tablet at 11/30/23 1478   norepinephrine (LEVOPHED) 4mg  in (0.016 mg/mL) premix infusion, 2-40 mcg/min, Intravenous, Titrated, Desai, Rahul P, PA-C, Last Rate: 30 mL/hr at 11/30/23 1000, 8 mcg/min at 11/30/23 1000   Oral care mouth rinse, 15 mL, Mouth Rinse, Q2H, Duayne Cal, NP, 15 mL at 11/30/23 0758   Oral care mouth rinse, 15 mL, Mouth Rinse, PRN, Duayne Cal, NP   polyethylene glycol (MIRALAX / GLYCOLAX) packet 17 g, 17 g, Oral, Daily PRN, Reita May,  Rutwij, MD   polyethylene glycol (MIRALAX / GLYCOLAX) packet 17 g, 17 g, Per Tube, Daily, Duayne Cal, NP, 17 g at 11/28/23 1023   [EXPIRED] thiamine (VITAMIN B1) 500 mg in sodium chloride 0.9 % 50 mL IVPB, 500 mg, Intravenous, Q8H, Stopped at 11/29/23 2314 **FOLLOWED BY** thiamine (VITAMIN B1) 250 mg in sodium chloride 0.9 % 50 mL IVPB, 250 mg, Intravenous, Daily, Last Rate: 105 mL/hr at 11/30/23 1029, 250 mg at 11/30/23 1029 **FOLLOWED BY** [START ON 12/06/2023] thiamine (VITAMIN B1) injection 100 mg, 100 mg, Intravenous, Daily, Erick Blinks, MD   topiramate (TOPAMAX) tablet 25 mg, 25 mg, Per Tube, QHS, Larisha Vencill L, MD, 25 mg at 11/29/23 2234   vasopressin (PITRESSIN) 20 Units in 100 mL (0.2 unit/mL) infusion-*FOR SHOCK*, 0-0.04 Units/min, Intravenous, Continuous, Luciano Cutter, MD, Last Rate: 9 mL/hr at 11/30/23 1000, 0.03 Units/min at 11/30/23 1000  Labs and Diagnostic Imaging    Basic Metabolic Panel: Recent Labs  Lab 11/28/23 0454 11/28/23 0939 11/28/23 2236 11/29/23 0417 11/29/23 1640 11/30/23 0507  11/30/23 1536 12/01/23 0449  NA  --    < > 134* 133*  --  133* 132* 134*  K  --    < > 3.5 3.4*  --  3.3* 3.0* 3.1*  CL  --    < > 103 101  --  101 103 105  CO2  --    < > 19* 19*  --  18* 17* 18*  GLUCOSE  --    < > 189* 206*  --  167* 178* 110*  BUN  --    < > 35* 36*  --  42* 45* 44*  CREATININE 2.38*   < > 3.06* 3.16*  --  3.26* 3.23* 2.87*  CALCIUM  --    < > 6.7* 6.8*  --  7.4* 7.3* 7.4*  MG 2.1  --  1.8 1.9 2.4  --   --   --   PHOS  --   --  3.7 3.9 3.2  --   --   --    < > = values in this interval not displayed.    CBC: Recent Labs  Lab 11/27/23 2058 11/28/23 0155 11/28/23 0939 11/28/23 1148 11/29/23 0417 11/30/23 0507 12/01/23 0449  WBC 16.1*  16.5*  --  15.6*  --  14.7* 11.4* 7.0  NEUTROABS 13.8*  --   --   --   --   --   --   HGB 13.8  13.9   < > 14.1 13.9 13.0 12.7 10.5*  HCT 43.6  43.8   < > 43.4 41.0 40.1 37.7 31.0*  MCV 86.0  86.7   --  85.1  --  83.4 81.3 81.8  PLT 197  186  --  93*  --  62* 46* 33*   < > = values in this interval not displayed.    Coagulation Studies: Recent Labs    11/27/23 2058 11/29/23 0417 11/30/23 0507  LABPROT 17.2* 30.8* 25.6*  INR 1.4* 2.9* 2.3*     LFT trends Lab Results  Component Value Date   ALT 1,705 (H) 11/30/2023   AST 1,902 (H) 11/30/2023   ALKPHOS 62 11/30/2023   BILITOT 2.3 (H) 11/30/2023    Lab Results  Component Value Date   ALT 1,459 (H) 12/01/2023   AST 1,158 (H) 12/01/2023   ALKPHOS 50 12/01/2023   BILITOT 1.6 (H) 12/01/2023    Urine Drug Screen:     Component Value Date/Time   LABOPIA NONE DETECTED 11/27/2023 2207   COCAINSCRNUR NONE DETECTED 11/27/2023 2207   LABBENZ POSITIVE (A) 11/27/2023 2207   AMPHETMU NONE DETECTED 11/27/2023 2207   THCU POSITIVE (A) 11/27/2023 2207   LABBARB NONE DETECTED 11/27/2023 2207    Alcohol Level     Component Value Date/Time   ETH <10 11/27/2023 2058   INR  Lab Results  Component Value Date   INR 2.3 (H) 11/30/2023   AED levels:  Lamotrigine Lvl 5.5 (3/25) 8.1 CM (3/8)  Comment: (NOTE)                                Detection Limit = 1.0 Performed At: [facility] [address] [place], [place] [id number] Jolene Schimke MD NW:[id number]  Resulting Agency [facility] CLIN LAB [facility] CL[facility] LAB        Specimen Collected: 11/27/23 20:55     Topiramate Lvl 3.9  Comment: (NOTE)  Detection Limit = 1.5 Performed At: [facility] [address] [place], [place] [id number] Jolene Schimke MD WU:[id number]  Resulting Agency [facility] CLIN LAB        Specimen Collected: 11/28/23 09:39 (Before any given this admission)    Lab Results  Component Value Date   CKTOTAL 10,928 (H) 11/30/2023   Lab Results  Component Value Date   CKTOTAL 6,323 (H) 12/01/2023      Latest Reference Range & Units 11/28/23 14:28  Appearance, CSF CLEAR  HAZY !  Glucose, CSF 40 - 70 mg/dL 71  (H)  RBC Count, CSF 0 /cu mm 1,045 (H)  WBC, CSF 0 - 5 /cu mm 3  Other Cells, CSF  TOO FEW TO COUNT, SMEAR AVAILABLE FOR REVIEW  Color, CSF COLORLESS  PINK !  Supernatant  COLORLESS  Total  Protein, CSF 15 - 45 mg/dL 74 (H)  Tube #  1,4  !: Data is abnormal (H): Data is abnormally high  CT Head without contrast(Personally reviewed): No acute intracranial abnormalities. Chronic inflammatory changes suggested in the paranasal sinuses.  CT angio Head and Neck with contrast(Personally reviewed): No large vessel occlusion or proximal hemodynamically significant stenosis.  MRI brain 3/28/205 MR Brain wo contrast 11/30/2023: Extensive cortical restricted diffusion in the right cerebral hemisphere and medial right thalamus with contralateral involvement of the cerebellum, pattern compatible with seizure phenomenon in this patient in recent status. Small foci of restricted diffusion in the left temporal white matter more suggestive of white matter infarcts. There is chronic white matter disease.  cEEG: 11/28/2023 0239 to 11/28/2023 0845   IMPRESSION: This study showed evidence of epileptogenicity and cortical dysfunction arising from right hemisphere, maximal right temporal region with increased risk of seizure recurrence.  No definite seizures were noted  cEEG Duration: 11/29/2023 0239 to 11/29/2023 1000  -Lateralized periodic discharges, right hemisphere -Continuous slow, generalized and lateralized right hemisphere   cEEG 12/01/2023 0239 to 12/01/2023 0930  This study showed evidence of epileptogenicity and cortical dysfunction arising from right hemisphere, maximal right temporal region with increased risk of seizure recurrence. Additionally there was also evidence of epileptogenicity arising from left hemisphere. No seizures were noted. EEG appears similar to previous day.  Assessment   Gradually improving but still minimally interactive. Imaging c/w prolonged seizures    Impression: Status epilepticus -- resolved  Encehalopathy secondary to post-ictal state, medications and medical conditions as below Shock liver -- appreciate GI following Rhabdomyolysis -- improving AKI -- improving Cardiomyopathy with reduced EF -- likely secondary to stress Acute respiratory failure with hypoxia  Thrombocytopenia and coagulopathy -- worsening  Recommendations  - continue LTM EEG - Will try stopping Keppra today - Lacosamide continue 100 mg BID as lamotrigine and topirimate increased - Skip AM Clonazepam and decrease to Clonazepam to 1 mg BID starting tonight;  - Continue lamotrigine to 100 mg BID; will increase back to home dose as liver function improves  - Continue Topiramate to 50 mg at bedtime; will increase back to home dose as liver function improves  - Consider MRI brain after LTM EEG off (non-compatible leads) depending on clinical course) - Appreciate CCM management of rhabdomyolysis and other comorbidities, discussed with team via secure chat ______________________________________________________________________   Brooke Dare MD-PhD [facility] [phone]  CRITICAL CARE Performed by: Gordy Councilman   Total critical care time: 35 minutes  Critical care time was exclusive of separately billable procedures and treating other patients.  Critical care was necessary to treat or prevent imminent or life-threatening  deterioration.  Critical care was time spent personally by me on the following activities: development of treatment plan with patient and/or surrogate as well as nursing, discussions with consultants, evaluation of patient's response to treatment, examination of patient, obtaining history from patient or surrogate, ordering and performing treatments and interventions, ordering and review of laboratory studies, ordering and review of radiographic studies, pulse oximetry and re-evaluation of patient's condition.

## 2023-11-30 NOTE — Progress Notes (Addendum)
 Subjective: Had vomiting prior to MRI this morning. No other acute events  ROS: Unable to obtain due to poor mental status  Examination  Vital signs in last 24 hours: Temp:  [98.2 F (36.8 C)-99.4 F (37.4 C)] 98.6 F (37 C) (03/28 1154) Pulse Rate:  [61-86] 74 (03/28 1400) Resp:  [15-27] 23 (03/28 1400) BP: (109-139)/(48-100) 116/97 (03/28 1400) SpO2:  [98 %-100 %] 99 % (03/28 1400) FiO2 (%):  [40 %] 40 % (03/28 0735) Weight:  [64 kg] 64 kg (03/28 0500)  General: lying in bed, intubated Neuro: comatose, doesn't open eyes, deosnt follow commands, PERLA, corneal and cough reflex in tact, withdraws to noxious stimuli in all extremities BL LE>BL UE  Basic Metabolic Panel: Recent Labs  Lab 11/27/23 2058 11/28/23 0155 11/28/23 0454 11/28/23 0939 11/28/23 1148 11/28/23 2236 11/29/23 0417 11/29/23 1640 11/30/23 0507  NA 137   < >  --  141 140 134* 133*  --  133*  K 4.5   < >  --  2.6* 2.8* 3.5 3.4*  --  3.3*  CL 112*  --   --  109  --  103 101  --  101  CO2 10*  --   --  17*  --  19* 19*  --  18*  GLUCOSE 103*  --   --  103*  --  189* 206*  --  167*  BUN 17  --   --  27*  --  35* 36*  --  42*  CREATININE 2.46*  --  2.38* 2.80*  --  3.06* 3.16*  --  3.26*  CALCIUM 8.5*  --   --  7.8*  --  6.7* 6.8*  --  7.4*  MG  --   --  2.1  --   --  1.8 1.9 2.4  --   PHOS  --   --   --   --   --  3.7 3.9 3.2  --    < > = values in this interval not displayed.    CBC: Recent Labs  Lab 11/27/23 2058 11/28/23 0155 11/28/23 0939 11/28/23 1148 11/29/23 0417 11/30/23 0507  WBC 16.1*  16.5*  --  15.6*  --  14.7* 11.4*  NEUTROABS 13.8*  --   --   --   --   --   HGB 13.8  13.9 12.6 14.1 13.9 13.0 12.7  HCT 43.6  43.8 37.0 43.4 41.0 40.1 37.7  MCV 86.0  86.7  --  85.1  --  83.4 81.3  PLT 197  186  --  93*  --  62* 46*     Coagulation Studies: Recent Labs    11/27/23 Dec 08, 2056 11/29/23 0417 11/30/23 0507  LABPROT 17.2* 30.8* 25.6*  INR 1.4* 2.9* 2.3*    Imaging personally  reviewed  MR Brain wo contrast 11/30/2023: Extensive cortical restricted diffusion in the right cerebral hemisphere and medial right thalamus with contralateral involvement of the cerebellum, pattern compatible with seizure phenomenon in this patient in recent status. Small foci of restricted diffusion in the left temporal white matter more suggestive of white matter infarcts. There is chronic white matter disease.   ASSESSMENT AND PLAN: 53 y.o. female with hx of alcohol and substance use, Htn, seizures on lamotrigine and topamax who was found seizing for unknown duration.  She was given Versed in the field with resolution of clinical seizure activity noted.  Obtunded mentation for several hours after arrival with no significant improvement.  CT  head without contrast with no acute intracranial abnormality.  CT angio of the head and neck with no basilar thrombosis or LVO. EEG showed LPDS with likely ictal nature.  Status epilepticus convulsive, resolved Acute encephalopathy due to seizure Shock liver -- appreciate GI following Rhabdomyolysis -- improving AKI -- mildly worsening Cardiomyopathy with reduced EF -- likely secondary to stress Acute respiratory failure with hypoxia  Thrombocytopenia and coagulopathy -- worsening - No further seizures, still has LPDS in right  Recommendations - I recommended unhooking eeg this morning to get mri brain and then rehook EEG - Continue LTM eeg for atleast 24 more hours. If mental status continues to improve, can likely dc tomorrow - MR brain changes likely secondary to prolonged status prior to arrival - Please minimize sedation and watch for improvement in mentation - Keppra 500mg  BID (not a good long-term option due to reported side effects); continue for now  - Lacosamide 200mg  BID; reduce to 100 mg BID as lamotrigine and topirimate increased - Continue Clonazepam increase to 2 mg BID; may need to decrease if liver function continues to worsen -  Increase lamotrigine to 100 mg BID; will increase back to home dose as liver function improves  - Increase Topiramate to 50 mg at bedtime; will increase back to home dose as liver function improves  - Continue seizure precautions - PRN IV ativan for clinical sz - Discussed plan with family and ICU team  I have spent a total of  36  minutes with the patient reviewing hospital notes,  test results, labs and examining the patient as well as establishing an assessment and plan. > 50% of time was spent in direct patient care.     Lindie Spruce Epilepsy [facility] For questions after 5pm please refer to AMION to reach the Neurologist on call

## 2023-12-01 ENCOUNTER — Inpatient Hospital Stay (HOSPITAL_COMMUNITY)

## 2023-12-01 DIAGNOSIS — G40901 Epilepsy, unspecified, not intractable, with status epilepticus: Secondary | ICD-10-CM | POA: Diagnosis not present

## 2023-12-01 DIAGNOSIS — I5021 Acute systolic (congestive) heart failure: Secondary | ICD-10-CM | POA: Diagnosis not present

## 2023-12-01 DIAGNOSIS — I429 Cardiomyopathy, unspecified: Secondary | ICD-10-CM | POA: Diagnosis not present

## 2023-12-01 DIAGNOSIS — J189 Pneumonia, unspecified organism: Secondary | ICD-10-CM

## 2023-12-01 DIAGNOSIS — R570 Cardiogenic shock: Secondary | ICD-10-CM | POA: Diagnosis not present

## 2023-12-01 DIAGNOSIS — R6521 Severe sepsis with septic shock: Secondary | ICD-10-CM

## 2023-12-01 DIAGNOSIS — D696 Thrombocytopenia, unspecified: Secondary | ICD-10-CM

## 2023-12-01 DIAGNOSIS — R579 Shock, unspecified: Secondary | ICD-10-CM | POA: Diagnosis not present

## 2023-12-01 DIAGNOSIS — N179 Acute kidney failure, unspecified: Secondary | ICD-10-CM | POA: Diagnosis not present

## 2023-12-01 DIAGNOSIS — E785 Hyperlipidemia, unspecified: Secondary | ICD-10-CM

## 2023-12-01 DIAGNOSIS — I214 Non-ST elevation (NSTEMI) myocardial infarction: Secondary | ICD-10-CM | POA: Diagnosis not present

## 2023-12-01 DIAGNOSIS — I10 Essential (primary) hypertension: Secondary | ICD-10-CM | POA: Diagnosis not present

## 2023-12-01 DIAGNOSIS — J9601 Acute respiratory failure with hypoxia: Secondary | ICD-10-CM | POA: Diagnosis not present

## 2023-12-01 LAB — GLUCOSE, CAPILLARY
Glucose-Capillary: 108 mg/dL — ABNORMAL HIGH (ref 70–99)
Glucose-Capillary: 113 mg/dL — ABNORMAL HIGH (ref 70–99)
Glucose-Capillary: 125 mg/dL — ABNORMAL HIGH (ref 70–99)
Glucose-Capillary: 129 mg/dL — ABNORMAL HIGH (ref 70–99)
Glucose-Capillary: 141 mg/dL — ABNORMAL HIGH (ref 70–99)
Glucose-Capillary: 164 mg/dL — ABNORMAL HIGH (ref 70–99)

## 2023-12-01 LAB — CBC
HCT: 31 % — ABNORMAL LOW (ref 36.0–46.0)
Hemoglobin: 10.5 g/dL — ABNORMAL LOW (ref 12.0–15.0)
MCH: 27.7 pg (ref 26.0–34.0)
MCHC: 33.9 g/dL (ref 30.0–36.0)
MCV: 81.8 fL (ref 80.0–100.0)
Platelets: 33 10*3/uL — ABNORMAL LOW (ref 150–400)
RBC: 3.79 MIL/uL — ABNORMAL LOW (ref 3.87–5.11)
RDW: 15.9 % — ABNORMAL HIGH (ref 11.5–15.5)
WBC: 7 10*3/uL (ref 4.0–10.5)
nRBC: 0 % (ref 0.0–0.2)

## 2023-12-01 LAB — COMPREHENSIVE METABOLIC PANEL WITH GFR
ALT: 1459 U/L — ABNORMAL HIGH (ref 0–44)
AST: 1158 U/L — ABNORMAL HIGH (ref 15–41)
Albumin: 2 g/dL — ABNORMAL LOW (ref 3.5–5.0)
Alkaline Phosphatase: 50 U/L (ref 38–126)
Anion gap: 11 (ref 5–15)
BUN: 44 mg/dL — ABNORMAL HIGH (ref 6–20)
CO2: 18 mmol/L — ABNORMAL LOW (ref 22–32)
Calcium: 7.4 mg/dL — ABNORMAL LOW (ref 8.9–10.3)
Chloride: 105 mmol/L (ref 98–111)
Creatinine, Ser: 2.87 mg/dL — ABNORMAL HIGH (ref 0.44–1.00)
GFR, Estimated: 19 mL/min — ABNORMAL LOW (ref >60)
Glucose, Bld: 110 mg/dL — ABNORMAL HIGH (ref 70–99)
Potassium: 3.1 mmol/L — ABNORMAL LOW (ref 3.5–5.1)
Sodium: 134 mmol/L — ABNORMAL LOW (ref 135–145)
Total Bilirubin: 1.6 mg/dL — ABNORMAL HIGH (ref 0.0–1.2)
Total Protein: 3.8 g/dL — ABNORMAL LOW (ref 6.5–8.1)

## 2023-12-01 LAB — CSF CULTURE W GRAM STAIN
Culture: NO GROWTH
Gram Stain: NONE SEEN

## 2023-12-01 LAB — LACTIC ACID, PLASMA: Lactic Acid, Venous: 1.5 mmol/L (ref 0.5–1.9)

## 2023-12-01 LAB — CK: Total CK: 6323 U/L — ABNORMAL HIGH (ref 38–234)

## 2023-12-01 LAB — PROTIME-INR
INR: 1.5 — ABNORMAL HIGH (ref 0.8–1.2)
Prothrombin Time: 18.2 s — ABNORMAL HIGH (ref 11.4–15.2)

## 2023-12-01 MED ORDER — HEPARIN SODIUM (PORCINE) 5000 UNIT/ML IJ SOLN: 5000.0000 [IU] | Freq: Three times a day (TID) | INTRAMUSCULAR | Status: DC

## 2023-12-01 MED ORDER — LACTATED RINGERS IV SOLN
INTRAVENOUS | Status: DC
Start: 2023-12-01 — End: 2023-12-02

## 2023-12-01 MED ORDER — POTASSIUM CHLORIDE 20 MEQ PO PACK
40.0000 meq | PACK | ORAL | Status: AC
  Administered 2023-12-01 (×2): 40 meq
  Filled 2023-12-01 (×2): qty 2

## 2023-12-01 MED ORDER — CLONAZEPAM 1 MG PO TABS
1.0000 mg | ORAL_TABLET | Freq: Two times a day (BID) | ORAL | Status: DC
  Administered 2023-12-01 – 2023-12-03 (×4): 1 mg
  Filled 2023-12-01 (×4): qty 1

## 2023-12-01 NOTE — Progress Notes (Signed)
 LTM maint complete - no skin breakdown under:  Fp2, F3

## 2023-12-01 NOTE — Progress Notes (Signed)
 Progress Note  Patient Name: Meredith Park Date of Encounter: 12/01/2023 Primary Cardiologist: Little Ishikawa, MD   Subjective   The patient is a 53 year old female with an abnormal echocardiogram who presents for evaluation of her cardiac condition Consulted by PCCM earlier in the week for abnormal echo findings.   She was found to have an abnormal echocardiogram with an ejection fraction of 40-45% and a small pericardial effusion. She is currently intubated and sedated  She has been sober for over a year, and her mother is the primary contact for any additional support needed. She is receiving IV fluids and has a central line for CVP monitoring.    Vital Signs    Vitals:   12/01/23 0600 12/01/23 0700 12/01/23 0751 12/01/23 0800  BP: 106/73 107/77 107/76   Pulse: 74 71 72   Resp: (!) 21 (!) 31 (!) 22   Temp:    98.1 F (36.7 C)  TempSrc:    Axillary  SpO2: 98% 98% 97%   Weight:      Height:        Intake/Output Summary (Last 24 hours) at 12/01/2023 1053 Last data filed at 12/01/2023 1037 Gross per 24 hour  Intake 4356.64 ml  Output 1930 ml  Net 2426.64 ml   Filed Weights   11/29/23 0500 11/30/23 0500 12/01/23 0500  Weight: 64 kg 64 kg 65 kg    Physical Exam   GEN: intubated and sedated.   Neck: JVD, Subclavian line CVP 16 Cardiac: RRR, no murmurs, rubs, or gallops.  Respiratory: mechanical breath sounds bilaterally. GI: Soft, nontender, non-distended    Labs   LABS Troponin: elevated CK: elevated Creatinine: 2.87 Sodium: 132 Potassium: low Hemoglobin: 10.5  DIAGNOSTIC Echocardiogram: EF 40-45%, small pericardial effusion (11/29/2023) Telemetry: sinus rhythm, artifact inappropriately triggered as PVCs    Chemistry Recent Labs  Lab 11/30/23 0507 11/30/23 1536 12/01/23 0449  NA 133* 132* 134*  K 3.3* 3.0* 3.1*  CL 101 103 105  CO2 18* 17* 18*  GLUCOSE 167* 178* 110*  BUN 42* 45* 44*  CREATININE 3.26* 3.23* 2.87*  CALCIUM 7.4*  7.3* 7.4*  PROT 4.2* 3.8* 3.8*  ALBUMIN 2.2* 2.1* 2.0*  AST 1,902* 1,586* 1,158*  ALT 1,705* 1,650* 1,459*  ALKPHOS 62 57 50  BILITOT 2.3* 1.8* 1.6*  GFRNONAA 16* 17* 19*  ANIONGAP 14 12 11      Hematology Recent Labs  Lab 11/29/23 0417 11/30/23 0507 12/01/23 0449  WBC 14.7* 11.4* 7.0  RBC 4.81 4.64 3.79*  HGB 13.0 12.7 10.5*  HCT 40.1 37.7 31.0*  MCV 83.4 81.3 81.8  MCH 27.0 27.4 27.7  MCHC 32.4 33.7 33.9  RDW 15.8* 15.9* 15.9*  PLT 62* 46* 33*    Cardiac EnzymesNo results for input(s): "TROPONINI" in the last 168 hours. No results for input(s): "TROPIPOC" in the last 168 hours.   BNPNo results for input(s): "BNP", "PROBNP" in the last 168 hours.   DDimer No results for input(s): "DDIMER" in the last 168 hours.   Cardiac Studies   Cardiac Studies & Procedures   ______________________________________________________________________________________________     ECHOCARDIOGRAM  ECHOCARDIOGRAM COMPLETE 11/28/2023  Narrative ECHOCARDIOGRAM REPORT    Patient Name:   Meredith Park Date of Exam: 11/28/2023 Medical Rec #:  [id number]           Height:       62.0 in Accession #:    [id number]          Weight:  142.6 lb Date of Birth:  Nov 15, 1970            BSA:          1.656 m Patient Age:    53 years            BP:           85/67 mmHg Patient Gender: F                   HR:           65 bpm. Exam Location:  Inpatient  Procedure: 2D Echo, Color Doppler and Cardiac Doppler (Both Spectral and Color Flow Doppler were utilized during procedure).  Indications:    R06.9 DOE  History:        Patient has no prior history of Echocardiogram examinations. Risk Factors:Hypertension and Dyslipidemia.  Sonographer:    Irving Burton Senior RDCS Referring Phys: [phone] Clarene Critchley [facility]   Sonographer Comments: Scanned supine on artificial respirator IMPRESSIONS   1. Left ventricular ejection fraction, by estimation, is 40 to 45%. The left ventricle has mildly decreased  function. The left ventricle demonstrates regional wall motion abnormalities (see scoring diagram/findings for description). Left ventricular diastolic parameters are consistent with Grade I diastolic dysfunction (impaired relaxation). 2. Right ventricular systolic function is normal. The right ventricular size is normal. 3. A small pericardial effusion is present. The pericardial effusion is circumferential. There is no evidence of cardiac tamponade. 4. The mitral valve is normal in structure. Trivial mitral valve regurgitation. 5. The aortic valve is tricuspid. Aortic valve regurgitation is not visualized.  FINDINGS Left Ventricle: Left ventricular ejection fraction, by estimation, is 40 to 45%. The left ventricle has mildly decreased function. The left ventricle demonstrates regional wall motion abnormalities. The left ventricular internal cavity size was normal in size. There is no left ventricular hypertrophy. Left ventricular diastolic parameters are consistent with Grade I diastolic dysfunction (impaired relaxation).   LV Wall Scoring: The basal anteroseptal segment, basal inferolateral segment, basal anterolateral segment, basal anterior segment, basal inferior segment, and basal inferoseptal segment are hypokinetic.  Right Ventricle: The right ventricular size is normal. No increase in right ventricular wall thickness. Right ventricular systolic function is normal.  Left Atrium: Left atrial size was normal in size.  Right Atrium: Right atrial size was normal in size.  Pericardium: A small pericardial effusion is present. The pericardial effusion is circumferential. There is no evidence of cardiac tamponade.  Mitral Valve: The mitral valve is normal in structure. Trivial mitral valve regurgitation.  Tricuspid Valve: The tricuspid valve is normal in structure. Tricuspid valve regurgitation is mild.  Aortic Valve: The aortic valve is tricuspid. Aortic valve regurgitation is not  visualized.  Pulmonic Valve: The pulmonic valve was normal in structure. Pulmonic valve regurgitation is trivial.  Aorta: The aortic root is normal in size and structure and the ascending aorta was not well visualized.  Venous: IVC assessment for right atrial pressure unable to be performed due to mechanical ventilation.  IAS/Shunts: No atrial level shunt detected by color flow Doppler.   LEFT VENTRICLE PLAX 2D LVIDd:         4.40 cm     Diastology LVIDs:         3.90 cm     LV e' medial:    4.57 cm/s LV PW:         1.00 cm     LV E/e' medial:  12.5 LV IVS:  0.60 cm     LV e' lateral:   5.33 cm/s LVOT diam:     2.00 cm     LV E/e' lateral: 10.7 LV SV:         55 LV SV Index:   33 LVOT Area:     3.14 cm  LV Volumes (MOD) LV vol d, MOD A2C: 49.9 ml LV vol d, MOD A4C: 64.3 ml LV vol s, MOD A2C: 26.2 ml LV vol s, MOD A4C: 30.4 ml LV SV MOD A2C:     23.7 ml LV SV MOD A4C:     64.3 ml LV SV MOD BP:      26.8 ml  RIGHT VENTRICLE RV S prime:     9.79 cm/s TAPSE (M-mode): 1.9 cm  LEFT ATRIUM             Index        RIGHT ATRIUM           Index LA diam:        2.80 cm 1.69 cm/m   RA Area:     13.30 cm LA Vol (A2C):   36.0 ml 21.74 ml/m  RA Volume:   34.10 ml  20.59 ml/m LA Vol (A4C):   34.5 ml 20.83 ml/m LA Biplane Vol: 36.5 ml 22.04 ml/m AORTIC VALVE LVOT Vmax:   92.00 cm/s LVOT Vmean:  63.900 cm/s LVOT VTI:    0.174 m  AORTA Ao Root diam: 3.00 cm  MITRAL VALVE               TRICUSPID VALVE MV Area (PHT): 3.91 cm    TR Peak grad:   17.6 mmHg MV Decel Time: 194 msec    TR Vmax:        210.00 cm/s MV E velocity: 56.90 cm/s MV A velocity: 66.30 cm/s  SHUNTS MV E/A ratio:  0.86        Systemic VTI:  0.17 m Systemic Diam: 2.00 cm  Donato Schultz MD Electronically signed by Donato Schultz MD Signature Date/Time: 11/28/2023/2:01:15 PM    Final          ______________________________________________________________________________________________         Assessment & Plan    Abnormal echocardiogram with reduced ejection fraction and pericardial effusion Echocardiogram shows ejection fraction of 40-45% with small pericardial effusion, no cardiac tamponade. Likely related to critical condition and resolving shock. Currently intubated and sedated, with regular rate and rhythm on telemetry. Not a candidate for ischemic evaluation.  - Consider cautious goal-directed medical therapy as recovery progresses - no evidence of tamponade  Septic shock with aspiration pneumonia Aspiration pneumonia suspected to have led to septic shock. Liver enzymes and creatinine levels improving, indicating positive response to treatment. Receiving NAC and IV fluids, on levophed for hemodynamic support. Central line in place for CVP monitoring. No current need for IV diuresis. - Continue IV fluid administration as per PCCM - Monitor liver function tests and creatinine levels - Maintain hemodynamic support with levophed; discussed with nurses, increased levo to 6 mcg  Status epilepticus - actively managed by neuro  Hyponatremia and hypokalemia Sodium level improved to 132. Potassium is being repleted and has been chronically low during this admission but is showing improvement.  Hypertension Not on carvedilol due to critical condition and ongoing treatment with levophed. Hypertension management will be reconsidered as stabilization occurs.  Hyperlipidemia Not on atorvastatin due to transaminitis. Hyperlipidemia management on hold until liver function stabilizes.  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  We will peripherally follow; if she recovers, we may be able to start some GDMT and eval timing for ischemic work up.  If new cardiac issues arise, we will be happy to re-assess and respond  CRITICAL CARE Performed by: Mivaan Corbitt A Ralpheal Zappone  Total critical care time:  35 minutes. Critical care time was exclusive of separately billable procedures and treating other patients. Critical care was necessary to treat or prevent imminent or life-threatening deterioration. Critical care was time spent personally by me on the following activities: development of treatment plan  as well as nursing, evaluation of patient's response to treatment, examination of patient, review of laboratory studies, review of radiographic studies, pulse oximetry.   For questions or updates, please contact [facility] Please consult [email or web address] for contact info under Cardiology/STEMI.      Riley Lam, MD FASE [facility] Cardiologist [facility]  [address] [place], [address] [place], [place] [address] [phone]  10:53 AM

## 2023-12-01 NOTE — Procedures (Signed)
 Patient Name: Meredith Park  MRN: [id number]  Epilepsy Attending: Charlsie Quest  Referring Physician/Provider: Erick Blinks, MD  Duration: 12/01/2023 0239 to 12/01/2023 0930   Patient history: 53 y.o. female with hx of alcohol and substance use, Htn, seizures on lamotrigine and topamax who was found seizing for unknown duration. EEG to evaluate for seizure   Level of alertness:  lethargic    AEDs during EEG study: LEV, LTG, TPM, LCM, clonazepam   Technical aspects: This EEG study was done with scalp electrodes positioned according to the 10-20 International system of electrode placement. Electrical activity was reviewed with band pass filter of 1-70Hz , sensitivity of 7 uV/mm, display speed of 15mm/sec with a 60Hz  notched filter applied as appropriate. EEG data were recorded continuously and digitally stored.  Video monitoring was available and reviewed as appropriate.   Description: EEG showed continuous generalized and lateralized right hemisphere 3 to 6 Hz theta-delta slowing admixed with 13-15hz  fronto-central beta activity. Lateralized periodic discharges are noted in right hemisphere every 3 to 5 seconds. Independent sharp waves were also noted in left hemisphere, at times periodic at 0.25-0.5Hz . Hyperventilation and photic stimulation were not performed.      ABNORMALITY - Lateralized periodic discharges, right hemisphere - Sharp waves, left hemisphere - Continuous slow, generalized and lateralized right hemisphere   IMPRESSION: This study showed evidence of epileptogenicity and cortical dysfunction arising from right hemisphere, maximal right temporal region with increased risk of seizure recurrence. Additionally there was also evidence of epileptogenicity arising from left hemisphere. No seizures were noted.   EEG appears similar to previous day.   Rexine Gowens Annabelle Harman

## 2023-12-01 NOTE — Progress Notes (Addendum)
 NAME:  Meredith Park, MRN:  [id number], DOB:  1970/09/24, LOS: 4 ADMISSION DATE:  11/27/2023, CONSULTATION DATE: 3/25 REFERRING MD: Dr. Charm Barges EDP, CHIEF COMPLAINT: Seizure  History of Present Illness:  53 year old female with past medical history as below, which is significant for seizure disorder followed at [facility] neurology, alcohol abuse reportedly sober for 1 year, hypertension, marijuana use, and hyperlipidemia.  Seizures are managed with Lamictal and recently added topamax with better control of seizures.  Last seen by neurology 07/19/2023.  Family reports she did have a mixup with her seizure medications a week prior to arrival.  She ran out of one of her medications and did not realize it.  This has been remedied and she is back taking both medications.  The patient was in her usual state of health until 3/25 when she was found down beside her bed by her family at approximately 7 PM.  Last known well around 2:30 PM.  Her mother found her and noticed her leg shaking and she was unresponsive.  EMS was called upon EMS arrival she was noted to have continuous full body seizure as well as vomiting.  She was treated with 10 mg of IM Versed with cessation of seizures.  She remained minimally responsive in the emergency department.  She was hypoxic requiring 10 L of oxygen.  Chest x-ray was unremarkable.  CT of the head and C-spine were unremarkable for acute issues with the exception of findings associated with chronic paranasal sinusitis.  Due to poor responsiveness PCCM was asked to evaluate for ICU admission.  Pertinent  Medical History   has a past medical history of Alcohol abuse, Alcohol withdrawal (HCC) (08/28/2018), High cholesterol, Hypertension, and Seizures (HCC).   Significant Hospital Events: Including procedures, antibiotic start and stop dates in addition to other pertinent events   3/25 admit to ICU. Intubated 3/26 LP performed. Increasing pressor requirement 3/27 Increased  vasopressor requirement with worsening MOD including AKI, shock liver. Echo with stress CM.  Interim History / Subjective:  Improved vasopressor requirement Objective   Blood pressure 107/76, pulse 72, temperature 98.2 F (36.8 C), temperature source Axillary, resp. rate (!) 22, height 5' 2.01" (1.575 m), weight 65 kg, last menstrual period 08/28/2020, SpO2 97%. CVP:  [0 mmHg-20 mmHg] 8 mmHg  Vent Mode: PSV;CPAP FiO2 (%):  [30 %-40 %] 30 % Set Rate:  [20 bmp] 20 bmp Vt Set:  [440 mL] 440 mL PEEP:  [5 cmH20] 5 cmH20 Pressure Support:  [5 cmH20] 5 cmH20   Intake/Output Summary (Last 24 hours) at 12/01/2023 0759 Last data filed at 12/01/2023 0700 Gross per 24 hour  Intake 4783.6 ml  Output 1630 ml  Net 3153.6 ml   Filed Weights   11/29/23 0500 11/30/23 0500 12/01/23 0500  Weight: 64 kg 64 kg 65 kg   Physical Exam: General: Critically and chronically ill-appearing, on precedex HENT: [place], AT, ETT in place Eyes: EOMI, no scleral icterus Respiratory: Clear to auscultation bilaterally.  No crackles, wheezing or rales Cardiovascular: RRR, -M/R/G, no JVD GI: BS+, soft, nontender Extremities:-Edema,-tenderness Neuro: Sedated, withdraws extremities except RUE, CNII-XII grossly intact, does not follow commands GU: Foley in place  Imaging, labs and test in EMR in the last 24 hours reviewed independently by me. Pertinent findings below:  LA 1.5, improved and normalized K 3.1 BUN/Cr 44/2.87> improving INR 2.9>2.3>1.5, improving AST 1158, improving ALT 1456, improving CK 16K>10K>6K   Resolved Hospital Problem list     Assessment & Plan:   Status epilepticus:  Home lamictal, topamax however recently off due to mix-up. CTA neg for LVO. CT spin no acute fx Acute encephalopathy 2/2 above: MR Brain with findings c/w prolonged status epilepticus -Neurology following. S/p keppra and vimpat in ED -AEDs per Neuro: Vimpat, lamictal, keppra, topiramate, PRN ativan -LTM EEG with no definite  seizures  -Lumbar puncture with neg culture to date. Meningitis/encephalitis panel neg -Abx de-escalated to Ceftriaxone 3/27 -Follow final cultures -Plan for MRI when off LTM  Acute respiratory failure with hypoxia:  Presumably aspiration pneumonia not yet showing on chest XR. PE in differential. Will hold off CTA due to recent contrast and AKI. LE dopplers neg for DVT -Full vent support -LTVV, 4-8cc/kg IBW with goal Pplat<30 and DP<15 -VAP and PAD protocol on Precedex for RASS goal -1 -SBT/WUA daily. Tolerating PS however extubation precluded due to mental status  Septic shock secondary aspiration. CNS infection ruled out.  Echo with newly depressed EF and LV WMA. Coox SpO2 >90%. Not in cardiogenic shock - ABX as above - Trend WBC and fever curve.  - Wean levophed for MAP >65. DC vasopressin. May need low dose levo for inotropic support - Maintenance IVF. Will need to reassess volume status and consider holding due to CFB >16L  New onset cardiomyopathy with EF 40-45% and new LV WMA, suspect stress induced NSTEMI Echo newly depressed EF 40-45% with LV WMA, grade I DD, small pericardial effusion without tamponade History of hypertension -Appreciate Cardiology input. Not a candidate for cath due to critical illness. Likely stress induced CM -Holding carvedilol due to borderline blood pressures -Vasopressor support as above  AKI 2/2 ATN, CIN- improved UOP, improved Cr Metabolic acidosis: resolved. nongap, lactic 2.7. Osmolar gap 20. Volatile panel neg. [facility] was contacted on 3/26. Ethanol <0.010 Methanol <0.010 Isoprop <0.010 Acetone <0.010 - Maintain renal perfusion - Trend UOP/Cr - Avoid nephrotoxic agents  Rhabdo/Elevated CK - improving - Continue mIVF - Trend CK  Transaminitis - improving Elevated INR - improving Abd [place] neg with doppler for thrombosis, normal liver, mild ascites, small right pleural effusion. Acute hepatitis panel neg - Appreciate GI input. - NAC x 3 days  total - Trend CMET/INR  Thrombocytopenia - in setting of liver dysfunction, sepsis - Trend - Transfuse for goal >10k  History of ETOH abuse - reportedly sober for over a year - Thiamine, folate, MVI  Hypokalemia - Replete - Trend  Best Practice (right click and "Reselect all SmartList Selections" daily)   Diet/type: tubefeeds DVT prophylaxis other hold for thrombocytopenia   Pressure ulcer(s): pressure ulcer assessment deferred  GI prophylaxis: H2B Lines: Central line Foley:  N/A Code Status:  full code Last date of multidisciplinary goals of care discussion [ ]   Updated family including mother on 3/28  Critical care time: 35 minutes    The patient is critically ill with multiple organ systems failure and requires high complexity decision making for assessment and support, frequent evaluation and titration of therapies, application of advanced monitoring technologies and extensive interpretation of multiple databases.  Independent Critical Care Time: 38 Minutes.   Mechele Collin, M.D. [facility] Pulmonary/Critical Care Medicine 12/01/2023 7:59 AM   Please see Amion for pager number to reach on-call Pulmonary and Critical Care Team.

## 2023-12-02 ENCOUNTER — Inpatient Hospital Stay (HOSPITAL_COMMUNITY)

## 2023-12-02 DIAGNOSIS — R570 Cardiogenic shock: Secondary | ICD-10-CM | POA: Diagnosis not present

## 2023-12-02 DIAGNOSIS — R569 Unspecified convulsions: Secondary | ICD-10-CM | POA: Diagnosis not present

## 2023-12-02 DIAGNOSIS — J9601 Acute respiratory failure with hypoxia: Secondary | ICD-10-CM | POA: Diagnosis not present

## 2023-12-02 DIAGNOSIS — I429 Cardiomyopathy, unspecified: Secondary | ICD-10-CM | POA: Diagnosis not present

## 2023-12-02 DIAGNOSIS — G40901 Epilepsy, unspecified, not intractable, with status epilepticus: Secondary | ICD-10-CM | POA: Diagnosis not present

## 2023-12-02 LAB — CBC
HCT: 30.9 % — ABNORMAL LOW (ref 36.0–46.0)
Hemoglobin: 10.2 g/dL — ABNORMAL LOW (ref 12.0–15.0)
MCH: 27.4 pg (ref 26.0–34.0)
MCHC: 33 g/dL (ref 30.0–36.0)
MCV: 83.1 fL (ref 80.0–100.0)
Platelets: 42 10*3/uL — ABNORMAL LOW (ref 150–400)
RBC: 3.72 MIL/uL — ABNORMAL LOW (ref 3.87–5.11)
RDW: 16 % — ABNORMAL HIGH (ref 11.5–15.5)
WBC: 7.6 10*3/uL (ref 4.0–10.5)
nRBC: 0 % (ref 0.0–0.2)

## 2023-12-02 LAB — COMPREHENSIVE METABOLIC PANEL WITH GFR
ALT: 916 U/L — ABNORMAL HIGH (ref 0–44)
AST: 579 U/L — ABNORMAL HIGH (ref 15–41)
Albumin: 2 g/dL — ABNORMAL LOW (ref 3.5–5.0)
Alkaline Phosphatase: 60 U/L (ref 38–126)
Anion gap: 10 (ref 5–15)
BUN: 43 mg/dL — ABNORMAL HIGH (ref 6–20)
CO2: 18 mmol/L — ABNORMAL LOW (ref 22–32)
Calcium: 8 mg/dL — ABNORMAL LOW (ref 8.9–10.3)
Chloride: 112 mmol/L — ABNORMAL HIGH (ref 98–111)
Creatinine, Ser: 2.44 mg/dL — ABNORMAL HIGH (ref 0.44–1.00)
GFR, Estimated: 23 mL/min — ABNORMAL LOW (ref >60)
Glucose, Bld: 140 mg/dL — ABNORMAL HIGH (ref 70–99)
Potassium: 3.9 mmol/L (ref 3.5–5.1)
Sodium: 140 mmol/L (ref 135–145)
Total Bilirubin: 1 mg/dL (ref 0.0–1.2)
Total Protein: 4.1 g/dL — ABNORMAL LOW (ref 6.5–8.1)

## 2023-12-02 LAB — GLUCOSE, CAPILLARY
Glucose-Capillary: 119 mg/dL — ABNORMAL HIGH (ref 70–99)
Glucose-Capillary: 119 mg/dL — ABNORMAL HIGH (ref 70–99)
Glucose-Capillary: 130 mg/dL — ABNORMAL HIGH (ref 70–99)
Glucose-Capillary: 133 mg/dL — ABNORMAL HIGH (ref 70–99)
Glucose-Capillary: 142 mg/dL — ABNORMAL HIGH (ref 70–99)
Glucose-Capillary: 157 mg/dL — ABNORMAL HIGH (ref 70–99)

## 2023-12-02 LAB — CULTURE, BLOOD (ROUTINE X 2)
Culture: NO GROWTH
Culture: NO GROWTH
Special Requests: ADEQUATE
Special Requests: ADEQUATE

## 2023-12-02 LAB — PROTIME-INR
INR: 1.2 (ref 0.8–1.2)
Prothrombin Time: 15.1 s (ref 11.4–15.2)

## 2023-12-02 LAB — CK: Total CK: 5145 U/L — ABNORMAL HIGH (ref 38–234)

## 2023-12-02 MED ORDER — SODIUM CHLORIDE 0.9 % IV SOLN
250.0000 mg | Freq: Two times a day (BID) | INTRAVENOUS | Status: DC
  Administered 2023-12-02 – 2023-12-05 (×7): 250 mg via INTRAVENOUS
  Filled 2023-12-02 (×8): qty 2.5

## 2023-12-02 MED ORDER — TOPIRAMATE 25 MG PO TABS
75.0000 mg | ORAL_TABLET | Freq: Every day | ORAL | Status: DC
  Administered 2023-12-02 – 2023-12-04 (×3): 75 mg
  Filled 2023-12-02 (×3): qty 3

## 2023-12-02 MED ORDER — LAMOTRIGINE 25 MG PO TABS
150.0000 mg | ORAL_TABLET | Freq: Two times a day (BID) | ORAL | Status: DC
  Administered 2023-12-02 – 2023-12-03 (×3): 150 mg
  Filled 2023-12-02 (×3): qty 2

## 2023-12-02 NOTE — Progress Notes (Signed)
 LTM maint complete - no skin breakdown under: F3, O2.

## 2023-12-02 NOTE — Progress Notes (Signed)
 NAME:  Meredith Park, MRN:  [id number], DOB:  08/07/1971, LOS: 5 ADMISSION DATE:  11/27/2023, CONSULTATION DATE: 3/25 REFERRING MD: Dr. Charm Barges EDP, CHIEF COMPLAINT: Seizure  History of Present Illness:  53 year old female with past medical history as below, which is significant for seizure disorder followed at [facility] neurology, alcohol abuse reportedly sober for 1 year, hypertension, marijuana use, and hyperlipidemia.  Seizures are managed with Lamictal and recently added topamax with better control of seizures.  Last seen by neurology 07/19/2023.  Family reports she did have a mixup with her seizure medications a week prior to arrival.  She ran out of one of her medications and did not realize it.  This has been remedied and she is back taking both medications.  The patient was in her usual state of health until 3/25 when she was found down beside her bed by her family at approximately 7 PM.  Last known well around 2:30 PM.  Her mother found her and noticed her leg shaking and she was unresponsive.  EMS was called upon EMS arrival she was noted to have continuous full body seizure as well as vomiting.  She was treated with 10 mg of IM Versed with cessation of seizures.  She remained minimally responsive in the emergency department.  She was hypoxic requiring 10 L of oxygen.  Chest x-ray was unremarkable.  CT of the head and C-spine were unremarkable for acute issues with the exception of findings associated with chronic paranasal sinusitis.  Due to poor responsiveness PCCM was asked to evaluate for ICU admission.  Pertinent  Medical History   has a past medical history of Alcohol abuse, Alcohol withdrawal (HCC) (08/28/2018), High cholesterol, Hypertension, and Seizures (HCC).   Significant Hospital Events: Including procedures, antibiotic start and stop dates in addition to other pertinent events   3/25 admit to ICU. Intubated 3/26 LP performed. Increasing pressor requirement 3/27 Increased  vasopressor requirement with worsening MOD including AKI, shock liver. Echo with stress CM. 3/29 Improved vasopressor requirements  Interim History / Subjective:  Weaned off levophed this AM however hypotensive with SBP upper 70s Improved UOP Objective   Blood pressure (!) 141/93, pulse (!) 105, temperature 99.1 F (37.3 C), temperature source Axillary, resp. rate (!) 24, height 5' 2.01" (1.575 m), weight 76.3 kg, last menstrual period 08/28/2020, SpO2 97%. CVP:  [0 mmHg-75 mmHg] 14 mmHg  Vent Mode: PRVC FiO2 (%):  [30 %] 30 % Set Rate:  [20 bmp] 20 bmp Vt Set:  [440 mL] 440 mL PEEP:  [5 cmH20] 5 cmH20 Pressure Support:  [5 cmH20] 5 cmH20   Intake/Output Summary (Last 24 hours) at 12/02/2023 0746 Last data filed at 12/02/2023 0600 Gross per 24 hour  Intake 3883.21 ml  Output 3820 ml  Net 63.21 ml   Filed Weights   11/30/23 0500 12/01/23 0500 12/02/23 0500  Weight: 64 kg 65 kg 76.3 kg   Physical Exam: General: Critically and chronically ill-appearing, no acute distress HENT: [place], AT, ETT in place Eyes: EOMI, no scleral icterus Respiratory: Clear to auscultation bilaterally.  No crackles, wheezing or rales Cardiovascular: RRR, -M/R/G, no JVD GI: BS+, soft, nontender Extremities:-Edema,-tenderness Neuro: Sedated, withdraws lower extremities, +cough/gag, CNII-XII grossly intact GU: Foley in place  Imaging, labs and test in EMR in the last 24 hours reviewed independently by me. Pertinent findings below:  Na normalized BUN/Cr improving 43/2.44 LFTs improving <1K now  CK 5K  Plts slowly improving 42   Resolved Hospital Problem list  Assessment & Plan:   Status epilepticus: Home lamictal, topamax however recently off due to mix-up. CTA neg for LVO. CT spin no acute fx Acute encephalopathy 2/2 above: MR Brain with findings c/w prolonged status epilepticus -Neurology following. S/p keppra and vimpat in ED -AEDs per Neuro: Vimpat, lamictal, keppra, topiramate, PRN  ativan -LTM EEG with no definite seizures  -Lumbar puncture with neg culture to date. Meningitis/encephalitis panel neg -Abx de-escalated to Ceftriaxone 3/27 -Follow final cultures  Acute respiratory failure with hypoxia:  Presumably aspiration pneumonia not yet showing on chest XR. PE in differential. Will hold off CTA due to recent contrast and AKI. LE dopplers neg for DVT -Full vent support -LTVV, 4-8cc/kg IBW with goal Pplat<30 and DP<15 -VAP and PAD protocol on Precedex for RASS goal -1 -SBT/WUA daily. Tolerating PS however extubation precluded due to mental status -Respiratory culture 3/29 f/u final  Septic shock secondary aspiration. CNS infection ruled out.  Echo with newly depressed EF and LV WMA. [place] SpO2 >90%. Not in cardiogenic shock - ABX as above - Trend WBC and fever curve.  - Off levophed for MAP >65. Will need to restart - DC maintenance IVF. With improved pressors and CFB >16L  New onset cardiomyopathy with EF 40-45% and new LV WMA, suspect stress induced NSTEMI Echo newly depressed EF 40-45% with LV WMA, grade I DD, small pericardial effusion without tamponade History of hypertension -Appreciate Cardiology input. Not a candidate for cath due to critical illness. Likely stress induced CM -Holding carvedilol due to borderline blood pressures -Vasopressor support as above  AKI 2/2 ATN, CIN- improved UOP, improved Cr Metabolic acidosis: resolved. nongap, lactic 2.7. Osmolar gap 20. Volatile panel neg. [facility] was contacted on 3/26. Ethanol <0.010 Methanol <0.010 Isoprop <0.010 Acetone <0.010 - Maintain renal perfusion - Trend UOP/Cr - Avoid nephrotoxic agents  Rhabdo/Elevated CK - improving - Monitor CK off fluids  Transaminitis - improving Elevated INR - normalized Abd [place] neg with doppler for thrombosis, normal liver, mild ascites, small right pleural effusion. Acute hepatitis panel neg - Appreciate GI input. - NAC x 3 days total - Trend  CMET/INR  Thrombocytopenia - in setting of liver dysfunction, sepsis. Improving - Trend - Transfuse for goal >10k  History of ETOH abuse - reportedly sober for over a year - Thiamine, folate, MVI  Hypokalemia - Replete - Trend  Best Practice (right click and "Reselect all SmartList Selections" daily)   Diet/type: tubefeeds DVT prophylaxis other hold for thrombocytopenia  Pressure ulcer(s): pressure ulcer assessment deferred  GI prophylaxis: H2B Lines: Central line Foley:  N/A Code Status:  full code Last date of multidisciplinary goals of care discussion [ ]   Updated family including mother on 3/30 on telephone  Critical care time: 36 minutes    The patient is critically ill with multiple organ systems failure and requires high complexity decision making for assessment and support, frequent evaluation and titration of therapies, application of advanced monitoring technologies and extensive interpretation of multiple databases.  Independent Critical Care Time: 36 Minutes.   Mechele Collin, M.D. [facility] Pulmonary/Critical Care Medicine 12/02/2023 7:46 AM   Please see Amion for pager number to reach on-call Pulmonary and Critical Care Team.

## 2023-12-02 NOTE — Progress Notes (Signed)
 NEUROLOGY CONSULT FOLLOW UP NOTE    Date of service: December 02, 2023 Patient Name: Meredith Park MRN:  [id number] DOB:  [birth year]  Meredith Park is a 53 y.o. female with hx of alcohol and substance use, Htn, seizures on lamotrigine and topamax who was found seizing for unknown duration.  She was given Versed in the field with resolution of clinical seizure activity noted.  Obtunded mentation for several hours after arrival with no significant improvement.  CT head without contrast with no acute intracranial abnormality.  CT angio of the head and neck with no basilar thrombosis or LVO. CK slightly improved today, renal and liver function worsening. CSF glucose 71, protein 74, white blood cells 3, meningitis/encephalitis panel negative.   Home AEDs: Lamotrigine 200 mg BID Topiramate 100 daily   Interval Hx/subjective   Current AEDs:  Keppra 500mg  BID Vimpat 100 mg BID Klonopin 1 mg BID S/p one time fospheny load on 3/27 which resolved frequent seizures in addition to increasing klonopin that day Lamotrigine 100 mg BID Topiramate 50 QHS  Vitals   Vitals:   12/02/23 0300 12/02/23 0400 12/02/23 0500 12/02/23 0600  BP: 115/77 137/82 137/87 (!) 141/93  Pulse: 93 96 100 (!) 105  Resp: (!) 23 (!) 23 (!) 23 (!) 24  Temp:  99.1 F (37.3 C)    TempSrc:  Axillary    SpO2: 98% 95% 97% 97%  Weight:   76.3 kg   Height:         Body mass index is 30.76 kg/m.  Physical Exam   Constitutional: Ill appearing  HENT: ETT in place Residual blood in mouth Cardiovascular: Normal rate and regular rhythm.  Respiratory: Mechanically ventilated, breathing over the vent  Neurologic Examination   Mental Status: Intubated, does not open eyes but more spontaneous movement this morning Cranial Nerves: II: Pupils are equal, round, and reactive to light.   III, IV, VI: Looks more readily to the right than the left when eyes opened for her VIII: Corneals intact X: Cough  strong Motor/Sensory: Moves right more than left, and lower extremities > upper extremities Reflexes  3+ and symmetric biceps and patellar  Medications  Current Facility-Administered Medications:    acetaminophen (TYLENOL) tablet 650 mg, 650 mg, Per Tube, Q6H PRN, Luciano Cutter, MD   [COMPLETED] acetylcysteine (ACETADOTE) 30.5 mg/mL load via infusion 9,600 mg, 150 mg/kg, Intravenous, Once, 9,600 mg at 11/29/23 1340 **FOLLOWED BY** [EXPIRED] acetylcysteine (ACETADOTE) 18,000 mg in dextrose 5 % 590 mL (30.5085 mg/mL) infusion, 12.5 mg/kg/hr, Intravenous, Continuous, Stopped at 11/29/23 1832 **FOLLOWED BY** acetylcysteine (ACETADOTE) 18,000 mg in dextrose 5 % 590 mL (30.5085 mg/mL) infusion, 6.25 mg/kg/hr, Intravenous, Continuous, Luciano Cutter, MD, Last Rate: 13.11 mL/hr at 12/02/23 0600, 6.25 mg/kg/hr at 12/02/23 0600   cefTRIAXone (ROCEPHIN) 2 g in sodium chloride 0.9 % 100 mL IVPB, 2 g, Intravenous, Q24H, Luciano Cutter, MD, Stopped at 12/01/23 1018   Chlorhexidine Gluconate Cloth 2 % PADS 6 each, 6 each, Topical, Daily, Duayne Cal, NP, 6 each at 12/01/23 2200   clonazePAM (KLONOPIN) tablet 1 mg, 1 mg, Per Tube, BID, Renzo Vincelette L, MD, 1 mg at 12/01/23 2158   dexmedetomidine (PRECEDEX) 400 MCG/100ML (4 mcg/mL) infusion, 0-1.2 mcg/kg/hr, Intravenous, Titrated, Luciano Cutter, MD, Last Rate: 4.85 mL/hr at 12/02/23 0600, 0.3 mcg/kg/hr at 12/02/23 0600   docusate (COLACE) 50 MG/5ML liquid 100 mg, 100 mg, Per Tube, BID, Duayne Cal, NP, 100 mg at 11/30/23 2134   docusate  sodium (COLACE) capsule 100 mg, 100 mg, Oral, BID PRN, Reita May, Rutwij, MD   famotidine (PEPCID) tablet 20 mg, 20 mg, Per Tube, Daily, Calton Dach I, RPH, 20 mg at 12/01/23 0941   feeding supplement (OSMOLITE 1.5 CAL) liquid 1,000 mL, 1,000 mL, Per Tube, Continuous, Luciano Cutter, MD, Last Rate: 50 mL/hr at 12/02/23 0600, Infusion Verify at 12/02/23 0600   feeding supplement (PROSource TF20) liquid  60 mL, 60 mL, Per Tube, Daily, Luciano Cutter, MD, 60 mL at 12/01/23 0941   fentaNYL (SUBLIMAZE) injection 50 mcg, 50 mcg, Intravenous, Q15 min PRN, Duayne Cal, NP, 50 mcg at 12/01/23 0005   fentaNYL (SUBLIMAZE) injection 50-200 mcg, 50-200 mcg, Intravenous, Q30 min PRN, Duayne Cal, NP   folic acid (FOLVITE) tablet 1 mg, 1 mg, Per Tube, Daily, Luciano Cutter, MD, 1 mg at 12/01/23 0940   insulin aspart (novoLOG) injection 0-9 Units, 0-9 Units, Subcutaneous, Q4H, Luciano Cutter, MD, 1 Units at 12/02/23 0400   ipratropium-albuterol (DUONEB) 0.5-2.5 (3) MG/3ML nebulizer solution 3 mL, 3 mL, Nebulization, Q6H PRN, Reita May, Rutwij, MD   lacosamide (VIMPAT) 100 mg in sodium chloride 0.9 % 25 mL IVPB, 100 mg, Intravenous, Q12H, Eldridge Marcott L, MD, Stopped at 12/01/23 2256   lactated ringers infusion, , Intravenous, Continuous, Luciano Cutter, MD, Last Rate: 75 mL/hr at 12/02/23 0600, Infusion Verify at 12/02/23 0600   lamoTRIgine (LAMICTAL) tablet 100 mg, 100 mg, Per Tube, BID, Jaramiah Bossard L, MD, 100 mg at 12/01/23 2158   levETIRAcetam (KEPPRA) IVPB 500 mg/100 mL premix, 500 mg, Intravenous, Q12H, Susa Bones L, MD, Stopped at 12/01/23 2212   LORazepam (ATIVAN) injection 2 mg, 2 mg, Intravenous, PRN, Charlsie Quest, MD   multivitamin with minerals tablet 1 tablet, 1 tablet, Per Tube, Daily, Calton Dach I, RPH, 1 tablet at 12/01/23 0940   norepinephrine (LEVOPHED) 4mg  in (0.016 mg/mL) premix infusion, 2-40 mcg/min, Intravenous, Titrated, Desai, Rahul P, PA-C, Stopped at 12/01/23 1441   Oral care mouth rinse, 15 mL, Mouth Rinse, Q2H, Duayne Cal, NP, 15 mL at 12/02/23 0555   Oral care mouth rinse, 15 mL, Mouth Rinse, PRN, Duayne Cal, NP   polyethylene glycol (MIRALAX / GLYCOLAX) packet 17 g, 17 g, Oral, Daily PRN, Reita May, Rutwij, MD   polyethylene glycol (MIRALAX / GLYCOLAX) packet 17 g, 17 g, Per Tube, Daily, Duayne Cal, NP, 17 g at 11/28/23 1023    [EXPIRED] thiamine (VITAMIN B1) 500 mg in sodium chloride 0.9 % 50 mL IVPB, 500 mg, Intravenous, Q8H, Stopped at 11/29/23 2314 **FOLLOWED BY** thiamine (VITAMIN B1) 250 mg in sodium chloride 0.9 % 50 mL IVPB, 250 mg, Intravenous, Daily, Stopped at 12/01/23 1242 **FOLLOWED BY** [START ON 12/06/2023] thiamine (VITAMIN B1) injection 100 mg, 100 mg, Intravenous, Daily, Erick Blinks, MD   topiramate (TOPAMAX) tablet 50 mg, 50 mg, Per Tube, QHS, Levar Fayson L, MD, 50 mg at 12/01/23 2158  Labs and Diagnostic Imaging    Basic Metabolic Panel: Recent Labs  Lab 11/28/23 0454 11/28/23 0939 11/28/23 2236 11/29/23 0417 11/29/23 1640 11/30/23 0507 11/30/23 1536 12/01/23 0449 12/02/23 0459  NA  --    < > 134* 133*  --  133* 132* 134* 140  K  --    < > 3.5 3.4*  --  3.3* 3.0* 3.1* 3.9  CL  --    < > 103 101  --  101 103 105 112*  CO2  --    < >  19* 19*  --  18* 17* 18* 18*  GLUCOSE  --    < > 189* 206*  --  167* 178* 110* 140*  BUN  --    < > 35* 36*  --  42* 45* 44* 43*  CREATININE 2.38*   < > 3.06* 3.16*  --  3.26* 3.23* 2.87* 2.44*  CALCIUM  --    < > 6.7* 6.8*  --  7.4* 7.3* 7.4* 8.0*  MG 2.1  --  1.8 1.9 2.4  --   --   --   --   PHOS  --   --  3.7 3.9 3.2  --   --   --   --    < > = values in this interval not displayed.    CBC: Recent Labs  Lab 11/27/23 2058 11/28/23 0155 11/28/23 0939 11/28/23 1148 11/29/23 0417 11/30/23 0507 12/01/23 0449 12/02/23 0459  WBC 16.1*  16.5*  --  15.6*  --  14.7* 11.4* 7.0 7.6  NEUTROABS 13.8*  --   --   --   --   --   --   --   HGB 13.8  13.9   < > 14.1 13.9 13.0 12.7 10.5* 10.2*  HCT 43.6  43.8   < > 43.4 41.0 40.1 37.7 31.0* 30.9*  MCV 86.0  86.7  --  85.1  --  83.4 81.3 81.8 83.1  PLT 197  186  --  93*  --  62* 46* 33* 42*   < > = values in this interval not displayed.    Coagulation Studies: Recent Labs    11/30/23 0507 12/01/23 0449 12/02/23 0459  LABPROT 25.6* 18.2* 15.1  INR 2.3* 1.5* 1.2     LFT trends Lab  Results  Component Value Date   ALT 1,705 (H) 11/30/2023   AST 1,902 (H) 11/30/2023   ALKPHOS 62 11/30/2023   BILITOT 2.3 (H) 11/30/2023    Lab Results  Component Value Date   ALT 916 (H) 12/02/2023   AST 579 (H) 12/02/2023   ALKPHOS 60 12/02/2023   BILITOT 1.0 12/02/2023    Urine Drug Screen:     Component Value Date/Time   LABOPIA NONE DETECTED 11/27/2023 2207   COCAINSCRNUR NONE DETECTED 11/27/2023 2207   LABBENZ POSITIVE (A) 11/27/2023 2207   AMPHETMU NONE DETECTED 11/27/2023 2207   THCU POSITIVE (A) 11/27/2023 2207   LABBARB NONE DETECTED 11/27/2023 2207    Alcohol Level     Component Value Date/Time   ETH <10 11/27/2023 2058   INR  Lab Results  Component Value Date   INR 1.2 12/02/2023   AED levels:  Lamotrigine Lvl 5.5 (3/25) 8.1 CM (3/8)  Comment: (NOTE)                                Detection Limit = 1.0 Performed At: [facility] [address] [place], [place] [id number] Jolene Schimke MD WU:[id number]  Resulting Agency [facility] CLIN LAB [facility] LAB        Specimen Collected: 11/27/23 20:55     Topiramate Lvl 3.9  Comment: (NOTE)                                Detection Limit = 1.5 Performed At: [facility] [address] [place], [place] [id number] Jolene Schimke MD [id number]  Resulting Agency  [facility] CLIN LAB        Specimen Collected: 11/28/23 09:39 (Before any given this admission)    Lab Results  Component Value Date   CKTOTAL 10,928 (H) 11/30/2023   Lab Results  Component Value Date   CKTOTAL 5,145 (H) 12/02/2023      Latest Reference Range & Units 11/28/23 14:28  Appearance, CSF CLEAR  HAZY !  Glucose, CSF 40 - 70 mg/dL 71 (H)  RBC Count, CSF 0 /cu mm 1,045 (H)  WBC, CSF 0 - 5 /cu mm 3  Other Cells, CSF  TOO FEW TO COUNT, SMEAR AVAILABLE FOR REVIEW  Color, CSF COLORLESS  PINK !  Supernatant  COLORLESS  Total  Protein, CSF 15 - 45 mg/dL 74 (H)  Tube #  1,4  !: Data is abnormal (H): Data is abnormally  high  CT Head without contrast(Personally reviewed): No acute intracranial abnormalities. Chronic inflammatory changes suggested in the paranasal sinuses.  CT angio Head and Neck with contrast(Personally reviewed): No large vessel occlusion or proximal hemodynamically significant stenosis.  MRI brain 3/28/205 MR Brain wo contrast 11/30/2023: Extensive cortical restricted diffusion in the right cerebral hemisphere and medial right thalamus with contralateral involvement of the cerebellum, pattern compatible with seizure phenomenon in this patient in recent status. Small foci of restricted diffusion in the left temporal white matter more suggestive of white matter infarcts. There is chronic white matter disease.  cEEG: 11/28/2023 0239 to 11/28/2023 0845   IMPRESSION: This study showed evidence of epileptogenicity and cortical dysfunction arising from right hemisphere, maximal right temporal region with increased risk of seizure recurrence.  No definite seizures were noted  cEEG Duration: 11/29/2023 0239 to 11/29/2023 1000  -Lateralized periodic discharges, right hemisphere -Continuous slow, generalized and lateralized right hemisphere   cEEG 12/01/2023 0239 to 12/01/2023 0930  This study showed evidence of epileptogenicity and cortical dysfunction arising from right hemisphere, maximal right temporal region with increased risk of seizure recurrence. Additionally there was also evidence of epileptogenicity arising from left hemisphere. No seizures were noted. EEG appears similar to previous day.  Assessment   Gradually improving but still minimally interactive. Imaging c/w prolonged seizures. EEG report pending  Impression: Status epilepticus -- resolved  Encehalopathy secondary to post-ictal state, medications and medical conditions as below Shock liver -- appreciate GI following, improving Rhabdomyolysis -- improving AKI -- improving Cardiomyopathy with reduced EF -- likely secondary to  stress Acute respiratory failure with hypoxia -- increased work of breathing this morning, appreciate CCM Thrombocytopenia -- stable Coagulopathy -- improving   Recommendations  - continue LTM EEG - continue Precedex and fentanyl for sedation so seizure control on standard anti-seizure medications can be assessed - Keppra reduce to 250 mg BID today - Lacosamide continue 100 mg BID for now - May further reduce clonazepam to 0.5 BID today if no seizures on LTM - Increase Lamotrigine to 150 mg BID; will increase back to home dose (200 BID) as liver function improves  - Increase Topiramate to 75 mg at bedtime; will increase back to home dose (100 nightly) as liver function improves  - Appreciate CCM management of rhabdomyolysis and other comorbidities, discussed with team via secure chat ______________________________________________________________________   Brooke Dare MD-PhD [facility] [phone]  CRITICAL CARE Performed by: Gordy Councilman   Total critical care time: 35  minutes  Critical care time was exclusive of separately billable procedures and treating other patients.  Critical care was necessary to treat or prevent imminent or life-threatening deterioration.  Critical  care was time spent personally by me on the following activities: development of treatment plan with patient and/or surrogate as well as nursing, discussions with consultants, evaluation of patient's response to treatment, examination of patient, obtaining history from patient or surrogate, ordering and performing treatments and interventions, ordering and review of laboratory studies, ordering and review of radiographic studies, pulse oximetry and re-evaluation of patient's condition.

## 2023-12-02 NOTE — Procedures (Signed)
 Patient Name: Meredith Park  MRN: [id number]  Epilepsy Attending: Charlsie Quest  Referring Physician/Provider: Erick Blinks, MD  Duration: 12/02/2023 0239 to 12/03/2023 0239   Patient history: 53 y.o. female with hx of alcohol and substance use, Htn, seizures on lamotrigine and topamax who was found seizing for unknown duration. EEG to evaluate for seizure   Level of alertness:  lethargic    AEDs during EEG study: LEV, LTG, TPM, LCM, clonazepam   Technical aspects: This EEG study was done with scalp electrodes positioned according to the 10-20 International system of electrode placement. Electrical activity was reviewed with band pass filter of 1-70Hz , sensitivity of 7 uV/mm, display speed of 54mm/sec with a 60Hz  notched filter applied as appropriate. EEG data were recorded continuously and digitally stored.  Video monitoring was available and reviewed as appropriate.   Description: EEG showed continuous low amplitude 3 to 6 Hz theta-delta slowing in right hemisphere and predominantly 5-7hz  theta slowing in left hemisphere. Independent sharp waves were noted in left and right frontotemporal region.  Hyperventilation and photic stimulation were not performed.      ABNORMALITY -Sharp wave, left frontotemporal region - Sharp waves, right frontotemporal - Continuous slow, generalized and lateralized right hemisphere   IMPRESSION: This study showed evidence of independent epileptogenicity arising from left and right frontotemporal region.  Additionally there is cortical dysfunction arising from right hemisphere likely secondary to post-ictal state. Lastly there is moderate diffuse encephalopathy. No seizures were noted.   EEG has improved compared to previous day.   Nezzie Manera Annabelle Harman

## 2023-12-03 ENCOUNTER — Inpatient Hospital Stay (HOSPITAL_COMMUNITY)

## 2023-12-03 DIAGNOSIS — K7200 Acute and subacute hepatic failure without coma: Secondary | ICD-10-CM | POA: Diagnosis not present

## 2023-12-03 DIAGNOSIS — J9600 Acute respiratory failure, unspecified whether with hypoxia or hypercapnia: Secondary | ICD-10-CM

## 2023-12-03 DIAGNOSIS — R748 Abnormal levels of other serum enzymes: Secondary | ICD-10-CM

## 2023-12-03 DIAGNOSIS — G40901 Epilepsy, unspecified, not intractable, with status epilepticus: Secondary | ICD-10-CM | POA: Diagnosis not present

## 2023-12-03 DIAGNOSIS — R569 Unspecified convulsions: Secondary | ICD-10-CM | POA: Diagnosis not present

## 2023-12-03 DIAGNOSIS — G9340 Encephalopathy, unspecified: Secondary | ICD-10-CM

## 2023-12-03 DIAGNOSIS — D649 Anemia, unspecified: Secondary | ICD-10-CM | POA: Diagnosis not present

## 2023-12-03 DIAGNOSIS — D696 Thrombocytopenia, unspecified: Secondary | ICD-10-CM | POA: Diagnosis not present

## 2023-12-03 LAB — POCT I-STAT 7, (LYTES, BLD GAS, ICA,H+H)
Acid-base deficit: 7 mmol/L — ABNORMAL HIGH (ref 0.0–2.0)
Bicarbonate: 17.1 mmol/L — ABNORMAL LOW (ref 20.0–28.0)
Calcium, Ion: 1.22 mmol/L (ref 1.15–1.40)
HCT: 26 % — ABNORMAL LOW (ref 36.0–46.0)
Hemoglobin: 8.8 g/dL — ABNORMAL LOW (ref 12.0–15.0)
O2 Saturation: 97 %
Patient temperature: 101.3
Potassium: 4 mmol/L (ref 3.5–5.1)
Sodium: 144 mmol/L (ref 135–145)
TCO2: 18 mmol/L — ABNORMAL LOW (ref 22–32)
pCO2 arterial: 31.5 mmHg — ABNORMAL LOW (ref 32–48)
pH, Arterial: 7.349 — ABNORMAL LOW (ref 7.35–7.45)
pO2, Arterial: 103 mmHg (ref 83–108)

## 2023-12-03 LAB — GLUCOSE, CAPILLARY
Glucose-Capillary: 102 mg/dL — ABNORMAL HIGH (ref 70–99)
Glucose-Capillary: 124 mg/dL — ABNORMAL HIGH (ref 70–99)
Glucose-Capillary: 152 mg/dL — ABNORMAL HIGH (ref 70–99)
Glucose-Capillary: 189 mg/dL — ABNORMAL HIGH (ref 70–99)
Glucose-Capillary: 192 mg/dL — ABNORMAL HIGH (ref 70–99)
Glucose-Capillary: 96 mg/dL (ref 70–99)

## 2023-12-03 LAB — COMPREHENSIVE METABOLIC PANEL WITH GFR
ALT: 538 U/L — ABNORMAL HIGH (ref 0–44)
AST: 304 U/L — ABNORMAL HIGH (ref 15–41)
Albumin: 2.1 g/dL — ABNORMAL LOW (ref 3.5–5.0)
Alkaline Phosphatase: 52 U/L (ref 38–126)
Anion gap: 6 (ref 5–15)
BUN: 44 mg/dL — ABNORMAL HIGH (ref 6–20)
CO2: 18 mmol/L — ABNORMAL LOW (ref 22–32)
Calcium: 7.5 mg/dL — ABNORMAL LOW (ref 8.9–10.3)
Chloride: 119 mmol/L — ABNORMAL HIGH (ref 98–111)
Creatinine, Ser: 1.73 mg/dL — ABNORMAL HIGH (ref 0.44–1.00)
GFR, Estimated: 35 mL/min — ABNORMAL LOW (ref >60)
Glucose, Bld: 140 mg/dL — ABNORMAL HIGH (ref 70–99)
Potassium: 4.1 mmol/L (ref 3.5–5.1)
Sodium: 143 mmol/L (ref 135–145)
Total Bilirubin: 0.7 mg/dL (ref 0.0–1.2)
Total Protein: 4 g/dL — ABNORMAL LOW (ref 6.5–8.1)

## 2023-12-03 LAB — CBC
HCT: 30.2 % — ABNORMAL LOW (ref 36.0–46.0)
Hemoglobin: 9.6 g/dL — ABNORMAL LOW (ref 12.0–15.0)
MCH: 27.4 pg (ref 26.0–34.0)
MCHC: 31.8 g/dL (ref 30.0–36.0)
MCV: 86.3 fL (ref 80.0–100.0)
Platelets: 70 10*3/uL — ABNORMAL LOW (ref 150–400)
RBC: 3.5 MIL/uL — ABNORMAL LOW (ref 3.87–5.11)
RDW: 16 % — ABNORMAL HIGH (ref 11.5–15.5)
WBC: 6.5 10*3/uL (ref 4.0–10.5)
nRBC: 0 % (ref 0.0–0.2)

## 2023-12-03 LAB — PROTIME-INR
INR: 1.1 (ref 0.8–1.2)
Prothrombin Time: 14.4 s (ref 11.4–15.2)

## 2023-12-03 LAB — CK: Total CK: 3444 U/L — ABNORMAL HIGH (ref 38–234)

## 2023-12-03 MED ORDER — CLONAZEPAM 0.5 MG PO TABS
0.5000 mg | ORAL_TABLET | Freq: Two times a day (BID) | ORAL | Status: AC
  Administered 2023-12-03 – 2023-12-05 (×4): 0.5 mg
  Filled 2023-12-03 (×4): qty 1

## 2023-12-03 MED ORDER — ACETAMINOPHEN 325 MG PO TABS
650.0000 mg | ORAL_TABLET | ORAL | Status: DC | PRN
  Administered 2023-12-03 – 2023-12-10 (×14): 650 mg
  Filled 2023-12-03 (×14): qty 2

## 2023-12-03 MED ORDER — CLONAZEPAM 0.5 MG PO TABS
0.5000 mg | ORAL_TABLET | Freq: Every day | ORAL | Status: AC
  Administered 2023-12-06 – 2023-12-07 (×2): 0.5 mg
  Filled 2023-12-03 (×2): qty 1

## 2023-12-03 MED ORDER — LAMOTRIGINE 100 MG PO TABS
200.0000 mg | ORAL_TABLET | Freq: Two times a day (BID) | ORAL | Status: DC
  Administered 2023-12-03 – 2023-12-07 (×8): 200 mg
  Filled 2023-12-03 (×9): qty 2

## 2023-12-03 MED ORDER — FUROSEMIDE 10 MG/ML IJ SOLN
60.0000 mg | Freq: Three times a day (TID) | INTRAMUSCULAR | Status: AC
  Administered 2023-12-03 (×2): 60 mg via INTRAVENOUS
  Filled 2023-12-03 (×2): qty 6

## 2023-12-03 NOTE — Progress Notes (Signed)
 Progress Note  Patient Name: Meredith Park Date of Encounter: 12/03/2023 Primary Cardiologist: Little Ishikawa, MD   Subjective   In interim, EEGs have improved. Kidney function and AST/ALT  continues to improve  Anemia likely stable.  Sodium normalized Unable to given history  Vital Signs    Vitals:   12/03/23 0800 12/03/23 0815 12/03/23 0830 12/03/23 0900  BP: 117/80 102/67 101/69 126/83  Pulse: 84 81 82 71  Resp: 18 18 19 19   Temp: 99.6 F (37.6 C)     TempSrc: Oral     SpO2: 99% 97% 96% 99%  Weight:      Height:        Intake/Output Summary (Last 24 hours) at 12/03/2023 0944 Last data filed at 12/03/2023 0932 Gross per 24 hour  Intake 2571.68 ml  Output 1425 ml  Net 1146.68 ml   Filed Weights   12/01/23 0500 12/02/23 0500 12/03/23 0500  Weight: 65 kg 76.3 kg 78.7 kg    Physical Exam   GEN: intubated and sedated.   Neck: JVD, Subclavian line  Cardiac: RRR, no murmurs, rubs, or gallops.  Respiratory: mechanical breath sounds bilaterally. GI: Soft, nontender, non-distended   Labs   Telemetry: SR   Chemistry Recent Labs  Lab 12/01/23 0449 12/02/23 0459 12/03/23 0233 12/03/23 0601  NA 134* 140 144 143  K 3.1* 3.9 4.0 4.1  CL 105 112*  --  119*  CO2 18* 18*  --  18*  GLUCOSE 110* 140*  --  140*  BUN 44* 43*  --  44*  CREATININE 2.87* 2.44*  --  1.73*  CALCIUM 7.4* 8.0*  --  7.5*  PROT 3.8* 4.1*  --  4.0*  ALBUMIN 2.0* 2.0*  --  2.1*  AST 1,158* 579*  --  304*  ALT 1,459* 916*  --  538*  ALKPHOS 50 60  --  52  BILITOT 1.6* 1.0  --  0.7  GFRNONAA 19* 23*  --  35*  ANIONGAP 11 10  --  6     Hematology Recent Labs  Lab 12/01/23 0449 12/02/23 0459 12/03/23 0233 12/03/23 0601  WBC 7.0 7.6  --  6.5  RBC 3.79* 3.72*  --  3.50*  HGB 10.5* 10.2* 8.8* 9.6*  HCT 31.0* 30.9* 26.0* 30.2*  MCV 81.8 83.1  --  86.3  MCH 27.7 27.4  --  27.4  MCHC 33.9 33.0  --  31.8  RDW 15.9* 16.0*  --  16.0*  PLT 33* 42*  --  70*     Cardiac  Studies   Cardiac Studies & Procedures   ______________________________________________________________________________________________     ECHOCARDIOGRAM  ECHOCARDIOGRAM COMPLETE 11/28/2023  Narrative ECHOCARDIOGRAM REPORT    Patient Name:   [name] Date of Exam: 11/28/2023 Medical Rec #:  [id number]           Height:       62.0 in Accession #:    [id number]          Weight:       142.6 lb Date of Birth:  Jan 12, 1971            BSA:          1.656 m Patient Age:    53 years            BP:           85/67 mmHg Patient Gender: F  HR:           65 bpm. Exam Location:  Inpatient  Procedure: 2D Echo, Color Doppler and Cardiac Doppler (Both Spectral and Color Flow Doppler were utilized during procedure).  Indications:    R06.9 DOE  History:        Patient has no prior history of Echocardiogram examinations. Risk Factors:Hypertension and Dyslipidemia.  Sonographer:    Irving Burton Senior RDCS Referring Phys: [phone] Clarene Critchley [facility]   Sonographer Comments: Scanned supine on artificial respirator IMPRESSIONS   1. Left ventricular ejection fraction, by estimation, is 40 to 45%. The left ventricle has mildly decreased function. The left ventricle demonstrates regional wall motion abnormalities (see scoring diagram/findings for description). Left ventricular diastolic parameters are consistent with Grade I diastolic dysfunction (impaired relaxation). 2. Right ventricular systolic function is normal. The right ventricular size is normal. 3. A small pericardial effusion is present. The pericardial effusion is circumferential. There is no evidence of cardiac tamponade. 4. The mitral valve is normal in structure. Trivial mitral valve regurgitation. 5. The aortic valve is tricuspid. Aortic valve regurgitation is not visualized.  FINDINGS Left Ventricle: Left ventricular ejection fraction, by estimation, is 40 to 45%. The left ventricle has mildly decreased function. The  left ventricle demonstrates regional wall motion abnormalities. The left ventricular internal cavity size was normal in size. There is no left ventricular hypertrophy. Left ventricular diastolic parameters are consistent with Grade I diastolic dysfunction (impaired relaxation).   LV Wall Scoring: The basal anteroseptal segment, basal inferolateral segment, basal anterolateral segment, basal anterior segment, basal inferior segment, and basal inferoseptal segment are hypokinetic.  Right Ventricle: The right ventricular size is normal. No increase in right ventricular wall thickness. Right ventricular systolic function is normal.  Left Atrium: Left atrial size was normal in size.  Right Atrium: Right atrial size was normal in size.  Pericardium: A small pericardial effusion is present. The pericardial effusion is circumferential. There is no evidence of cardiac tamponade.  Mitral Valve: The mitral valve is normal in structure. Trivial mitral valve regurgitation.  Tricuspid Valve: The tricuspid valve is normal in structure. Tricuspid valve regurgitation is mild.  Aortic Valve: The aortic valve is tricuspid. Aortic valve regurgitation is not visualized.  Pulmonic Valve: The pulmonic valve was normal in structure. Pulmonic valve regurgitation is trivial.  Aorta: The aortic root is normal in size and structure and the ascending aorta was not well visualized.  Venous: IVC assessment for right atrial pressure unable to be performed due to mechanical ventilation.  IAS/Shunts: No atrial level shunt detected by color flow Doppler.   LEFT VENTRICLE PLAX 2D LVIDd:         4.40 cm     Diastology LVIDs:         3.90 cm     LV e' medial:    4.57 cm/s LV PW:         1.00 cm     LV E/e' medial:  12.5 LV IVS:        0.60 cm     LV e' lateral:   5.33 cm/s LVOT diam:     2.00 cm     LV E/e' lateral: 10.7 LV SV:         55 LV SV Index:   33 LVOT Area:     3.14 cm  LV Volumes (MOD) LV vol d,  MOD A2C: 49.9 ml LV vol d, MOD A4C: 64.3 ml LV vol s, MOD A2C: 26.2 ml LV vol s,  MOD A4C: 30.4 ml LV SV MOD A2C:     23.7 ml LV SV MOD A4C:     64.3 ml LV SV MOD BP:      26.8 ml  RIGHT VENTRICLE RV S prime:     9.79 cm/s TAPSE (M-mode): 1.9 cm  LEFT ATRIUM             Index        RIGHT ATRIUM           Index LA diam:        2.80 cm 1.69 cm/m   RA Area:     13.30 cm LA Vol (A2C):   36.0 ml 21.74 ml/m  RA Volume:   34.10 ml  20.59 ml/m LA Vol (A4C):   34.5 ml 20.83 ml/m LA Biplane Vol: 36.5 ml 22.04 ml/m AORTIC VALVE LVOT Vmax:   92.00 cm/s LVOT Vmean:  63.900 cm/s LVOT VTI:    0.174 m  AORTA Ao Root diam: 3.00 cm  MITRAL VALVE               TRICUSPID VALVE MV Area (PHT): 3.91 cm    TR Peak grad:   17.6 mmHg MV Decel Time: 194 msec    TR Vmax:        210.00 cm/s MV E velocity: 56.90 cm/s MV A velocity: 66.30 cm/s  SHUNTS MV E/A ratio:  0.86        Systemic VTI:  0.17 m Systemic Diam: 2.00 cm  Donato Schultz MD Electronically signed by Donato Schultz MD Signature Date/Time: 11/28/2023/2:01:15 PM    Final          ______________________________________________________________________________________________      Assessment & Plan    Abnormal echocardiogram with reduced ejection fraction and pericardial effusion - mild decrease in LVEF no evidence of cardiogenic shock at this time  Hypertension - presently with hypotension; improved requiring less pressor support  Hyperlipidemia Not on atorvastatin due to transaminitis. Hyperlipidemia management on hold until liver function stabilizes (improving)  Septic shock with aspiration pneumonia - decreasing pressor requirement  Status epilepticus - actively managed by neuro  Alcohol abuse, in remission - getting IV thiamine  Currently most of care is none cardiac - If she has meaningful neurologic recovery, recommend repeat echocardiogram.  If LVEF is still depressed we are happy to re-assess and given new  recommendations - also happy to see if new cardiac issues arise        Riley Lam, MD FASE [facility] Cardiologist [facility]  [address] [place], [place] [address] [phone]  9:44 AM

## 2023-12-03 NOTE — Progress Notes (Signed)
 On 1430 assessment, patient able to wiggle toes on right lower extremity and squeeze hand on the right. CCM notified of updated neurological assessment.

## 2023-12-03 NOTE — Progress Notes (Signed)
 Daily Progress Note  DOA: 11/27/2023 Hospital Day: 7   Chief Complaint: Shock liver  ASSESSMENT    53 y.o. year old female hospitalized with:  NSTEMI / Acute respiratory failure / Status epilepticus.  GI saw in consult 3/27 for elevated LFTs.   Elevated LFTs.  Prior history of elevated LFTs ( to milder degree) with negative ANA, ASMA in 2022. This admission elevated LFTs felt to be 2/2 to shock liver and rhabdomyolysis.  RUQ ultrasound with patent portal and hepatic veins, otherwise normal-appearing liver, no duct dilation. Mild ascites. Treated with  NAC protocol ( stopped yesterday) Today:  Liver enzymes continue to improve. INR now normal at 1.1. On Precedex so unable to assess mental status.  Thrombocytopenia Secondary to sepsis?  Acute respiratory failure  Possibly 2/2 to aspiration PNA . Weaning in progress  Septic shock, ? Secondary to aspiration  Still intermittently requiring low dose pressors. On Rocephin  Status epilepticus follwing recent home medication mix up Neurology following.  New anemia.  Hgb 9.6 , down from 13 two days ago Has dark, liquid stool in collection bag  Abnormal echocardiogram with reduced ejection fraction and pericardial effusion.  Cardiology following,  no evidence of cardiogenic shock  AKI  PLAN   --Continue to trend liver enzymes --Statin on hold for now --Trend H/H --Continue Pepcid 20 mg.   Interim History / Subjective   Having loose stool. About 200 ml dark liquid stool in collection bag. Getting Fentanyl as needed for agitation per RN   Objective   Recent Labs    12/01/23 0449 12/02/23 0459 12/03/23 0233 12/03/23 0601  WBC 7.0 7.6  --  6.5  HGB 10.5* 10.2* 8.8* 9.6*  HCT 31.0* 30.9* 26.0* 30.2*  MCV 81.8 83.1  --  86.3  PLT 33* 42*  --  70*   No results for input(s): "FOLATE", "VITAMINB12", "FERRITIN", "TIBC", "IRONPCTSAT" in the last 72 hours. Recent Labs    12/01/23 0449 12/02/23 0459 12/03/23 0233  12/03/23 0601  NA 134* 140 144 143  K 3.1* 3.9 4.0 4.1  CL 105 112*  --  119*  CO2 18* 18*  --  18*  GLUCOSE 110* 140*  --  140*  BUN 44* 43*  --  44*  CREATININE 2.87* 2.44*  --  1.73*  CALCIUM 7.4* 8.0*  --  7.5*   Recent Labs    12/01/23 0449 12/02/23 0459 12/03/23 0601  PROT 3.8* 4.1* 4.0*  ALBUMIN 2.0* 2.0* 2.1*  AST 1,158* 579* 304*  ALT 1,459* 916* 538*  ALKPHOS 50 60 52  BILITOT 1.6* 1.0 0.7   Recent Labs    12/03/23 0601  INR 1.1   No results for input(s): "AFPTUMOR" in the last 72 hours.   No results for input(s): "ANA" in the last 72 hours.  Imaging:  Overnight EEG with video Charlsie Quest, MD     12/02/2023  8:04 AM Patient Name: Meredith Park  MRN: [id number]  Epilepsy Attending: Charlsie Quest  Referring Physician/Provider: Erick Blinks, MD  Duration: 11/28/2023 0239 to 11/29/2023 0239  Patient history: 53 y.o. female with hx of alcohol and substance  use, Htn, seizures on lamotrigine and topamax who was found  seizing for unknown duration. EEG to evaluate for seizure  Level of alertness:  comatose/ lethargic   AEDs during EEG study: LEV, versed  Technical aspects: This EEG study was done with scalp electrodes  positioned according to the 10-20 International system of  electrode  placement. Electrical activity was reviewed with band  pass filter of 1-70Hz , sensitivity of 7 uV/mm, display speed of  54mm/sec with a 60Hz  notched filter applied as appropriate. EEG  data were recorded continuously and digitally stored.  Video  monitoring was available and reviewed as appropriate.  Description: EEG showed continuous generalized and lateralized  right hemisphere 3 to 6 Hz theta-delta slowing.  Sharp waves were  noted in right hemisphere, maximal right temporal region, quasi  periodic at 1 2 5  to 1 Hz, at times rhythmic and lasting 10 to 15  seconds without definite evolution. Hyperventilation and photic  stimulation were not  performed.     ABNORMALITY -Sharp waves, right hemisphere -Continuous slow, generalized and lateralized right hemisphere  IMPRESSION: This study showed evidence of epileptogenicity and cortical  dysfunction arising from right hemisphere, maximal right temporal  region with increased risk of seizure recurrence. Additionally  there is severe diffuse encephalopathy.  No definite seizures  were noted  Priyanka Annabelle Harman      Scheduled inpatient medications:   Chlorhexidine Gluconate Cloth  6 each Topical Daily   clonazePAM  0.5 mg Per Tube BID   Followed by   Melene Muller ON 12/06/2023] clonazePAM  0.5 mg Per Tube Daily   docusate  100 mg Per Tube BID   famotidine  20 mg Per Tube Daily   feeding supplement (PROSource TF20)  60 mL Per Tube Daily   folic acid  1 mg Per Tube Daily   furosemide  60 mg Intravenous Q8H   insulin aspart  0-9 Units Subcutaneous Q4H   lamoTRIgine  150 mg Per Tube BID   multivitamin with minerals  1 tablet Per Tube Daily   mouth rinse  15 mL Mouth Rinse Q2H   polyethylene glycol  17 g Per Tube Daily   [START ON 12/06/2023] thiamine (VITAMIN B1) injection  100 mg Intravenous Daily   topiramate  75 mg Per Tube QHS   Continuous inpatient infusions:   cefTRIAXone (ROCEPHIN)  IV Stopped (12/03/23 4098)   dexmedetomidine (PRECEDEX) IV infusion 0.7 mcg/kg/hr (12/03/23 1000)   feeding supplement (OSMOLITE 1.5 CAL) 50 mL/hr at 12/03/23 1000   lacosamide (VIMPAT) IV 100 mg (12/03/23 1030)   levETIRAcetam Stopped (12/03/23 0924)   norepinephrine (LEVOPHED) Adult infusion 2 mcg/min (12/03/23 1000)   thiamine (VITAMIN B1) injection 105 mL/hr at 12/03/23 1000   PRN inpatient medications: acetaminophen, docusate sodium, fentaNYL (SUBLIMAZE) injection, ipratropium-albuterol, LORazepam, mouth rinse, polyethylene glycol  Vital signs in last 24 hours: Temp:  [98.8 F (37.1 C)-101.7 F (38.7 C)] 99.6 F (37.6 C) (03/31 0800) Pulse Rate:  [70-102] 71 (03/31 1000) Resp:  [0-30]  0 (03/31 1000) BP: (101-145)/(67-111) 113/78 (03/31 1000) SpO2:  [95 %-100 %] 99 % (03/31 1000) FiO2 (%):  [30 %] 30 % (03/31 0752) Weight:  [78.7 kg] 78.7 kg (03/31 0500) Last BM Date : 12/03/23  Intake/Output Summary (Last 24 hours) at 12/03/2023 1041 Last data filed at 12/03/2023 1000 Gross per 24 hour  Intake 2460.44 ml  Output 1575 ml  Net 885.44 ml    Intake/Output from previous day: 03/30 0701 - 03/31 0700 In: 2487.4 [I.V.:788.7; NG/GT:1270; IV Piggyback:428.7] Out: 1575 [Urine:1475; Stool:100] Intake/Output this shift: Total I/O In: 459.4 [I.V.:58; NG/GT:150; IV Piggyback:251.5] Out: 200 [Urine:200]   Physical Exam:  General: Partially sedated. Cannot follow command. Small bowel tube feeds in progress Heart:  Regular rate and rhythm.  Abdomen: Soft, nondistended.  Normal bowel sounds. Extremities: No lower extremity edema  LOS: 6 days   Willette Cluster ,NP 12/03/2023, 10:41 AM

## 2023-12-03 NOTE — Procedures (Signed)
 Patient Name: Meredith Park  MRN: [id number]  Epilepsy Attending: Charlsie Quest  Referring Physician/Provider: Erick Blinks, MD  Duration: 12/03/2023 0239 to 12/03/2023 1036   Patient history: 53 y.o. female with hx of alcohol and substance use, Htn, seizures on lamotrigine and topamax who was found seizing for unknown duration. EEG to evaluate for seizure   Level of alertness:  lethargic    AEDs during EEG study: LEV, LTG, TPM, LCM, clonazepam   Technical aspects: This EEG study was done with scalp electrodes positioned according to the 10-20 International system of electrode placement. Electrical activity was reviewed with band pass filter of 1-70Hz , sensitivity of 7 uV/mm, display speed of 87mm/sec with a 60Hz  notched filter applied as appropriate. EEG data were recorded continuously and digitally stored.  Video monitoring was available and reviewed as appropriate.   Description: EEG showed continuous low amplitude 3 to 6 Hz theta-delta slowing in right hemisphere and predominantly 5-7hz  theta slowing in left hemisphere. Independent sharp waves were noted in left and right frontotemporal region.  Hyperventilation and photic stimulation were not performed.      ABNORMALITY -Sharp wave, left frontotemporal region - Sharp waves, right frontotemporal - Continuous slow, generalized and lateralized right hemisphere   IMPRESSION: This study showed evidence of independent epileptogenicity arising from left and right frontotemporal region.  Additionally there is cortical dysfunction arising from right hemisphere likely secondary to post-ictal state. Lastly there is moderate diffuse encephalopathy. No seizures were noted.   [name]

## 2023-12-03 NOTE — Progress Notes (Signed)
 NAME:  Meredith Park, MRN:  [id number], DOB:  Jul 20, 1971, LOS: 6 ADMISSION DATE:  11/27/2023, CONSULTATION DATE: 3/25 REFERRING MD: Dr. Charm Barges EDP, CHIEF COMPLAINT: Seizure  History of Present Illness:  53 year old female with past medical history as below, which is significant for seizure disorder followed at [facility] neurology, alcohol abuse reportedly sober for 1 year, hypertension, marijuana use, and hyperlipidemia.  Seizures are managed with Lamictal and recently added topamax with better control of seizures.  Last seen by neurology 07/19/2023.  Family reports she did have a mixup with her seizure medications a week prior to arrival.  She ran out of one of her medications and did not realize it.  This has been remedied and she is back taking both medications.  The patient was in her usual state of health until 3/25 when she was found down beside her bed by her family at approximately 7 PM.  Last known well around 2:30 PM.  Her mother found her and noticed her leg shaking and she was unresponsive.  EMS was called upon EMS arrival she was noted to have continuous full body seizure as well as vomiting.  She was treated with 10 mg of IM Versed with cessation of seizures.  She remained minimally responsive in the emergency department.  She was hypoxic requiring 10 L of oxygen.  Chest x-ray was unremarkable.  CT of the head and C-spine were unremarkable for acute issues with the exception of findings associated with chronic paranasal sinusitis.  Due to poor responsiveness PCCM was asked to evaluate for ICU admission.  Pertinent  Medical History   has a past medical history of Alcohol abuse, Alcohol withdrawal (HCC) (08/28/2018), High cholesterol, Hypertension, and Seizures (HCC).   Significant Hospital Events: Including procedures, antibiotic start and stop dates in addition to other pertinent events   3/25 admit to ICU. Intubated 3/26 LP performed. Increasing pressor requirement 3/27 Increased  vasopressor requirement with worsening MOD including AKI, shock liver. Echo with stress CM. 3/29 Improved vasopressor requirements  Interim History / Subjective:  Precedex lightended this morning. Remains on low-dose norepinephrine.  Objective   Blood pressure 117/80, pulse 84, temperature (!) 101.7 F (38.7 C), temperature source Axillary, resp. rate 18, height 5' 2.01" (1.575 m), weight 78.7 kg, last menstrual period 08/28/2020, SpO2 99%.    Vent Mode: PSV;CPAP FiO2 (%):  [30 %] 30 % Set Rate:  [20 bmp] 20 bmp Vt Set:  [440 mL] 440 mL PEEP:  [5 cmH20] 5 cmH20 Pressure Support:  [10 cmH20] 10 cmH20 Plateau Pressure:  [12 cmH20-14 cmH20] 12 cmH20   Intake/Output Summary (Last 24 hours) at 12/03/2023 0818 Last data filed at 12/03/2023 0753 Gross per 24 hour  Intake 2408.35 ml  Output 1475 ml  Net 933.35 ml   Filed Weights   12/01/23 0500 12/02/23 0500 12/03/23 0500  Weight: 65 kg 76.3 kg 78.7 kg   Physical Exam: General: critically ill appearing woman lying in bed in NAD HENT [place]/AT, eyes anicteric, ETT Respiratory: rhonchi improved with suctioning, breathing comfortably on MV, thick tan sputum Cardiovascular: S1S2, RRR GI: soft, NT Extremities:  ++edema Neuro: minimal precedex, RASS -4, not following commands, + cough Derm: warm, dry, some bruising on legs and left hand  EEG: Cortical dysfunction from R hemisphere due to post-ictal state. Moderate diffuse encephalopathy. No seizures. Improving compared to prior.  Resp culture: few GPC, rare GNR, few yeast CSF culture: NG final Bicarb 18 BUN 44 Creatinine 1.73  AST 304 ALT 538 Bilirubin 0.7  CK 3444 WBC 6.5 H/H 9.6/30.2 Platelets 70  Resolved Hospital Problem list     Assessment & Plan:   Status epilepticus: Home lamictal, topamax however recently off due to mix-up. CTA neg for LVO. CT spin no acute fx Acute encephalopathy 2/2 above: MR Brain with findings c/w prolonged status epilepticus -cEEG, appreciate  Neurology's management -lamictal, keppra, topamax, vimpat, ativan PRN -CSF culture negative -ceftriaxone -neuroprotective measures  Acute respiratory failure with hypoxia requiring mechanical ventilation Aspiration pneumonia, culture pending -LTVV -VAP prevention protocol -PAD protocol for sedation -daily SAT & SBT as appropriate -re-culture sputum  Septic shock secondary to aspiration. CNS infection ruled out.  -norepi to maintain MAP >65 -start diuresing -ceftriaxone to complete 7 days -follow trach aspirate culture, reculture today  New onset cardiomyopathy with EF 40-45% and new LV WMA, suspect stress- induced Troponin elevation-- favor due to seizures, but no ischemia evaluation yet. Unlikely NSTEMI/ ACS. Echo newly depressed EF 40-45% with LV WMA, grade I DD, small pericardial effusion without tamponade History of hypertension -Appreciate Cardiology input. Not a candidate for cath due to critical illness. Likely stress induced CM -eventually needs GDMT when stable  AKI 2/2 rhabdo, ATN, CIN- improved UOP, improved Cr Metabolic acidosis -maintain adequate perfusion -strict I/O -renally dose meds, avoid nephrotoxic meds  Rhabdo due to seizures - improving -monitor CK periodically  Transaminase elevation - improving, was likely related to rhabdo Elevated INR - normalized Abd [place] neg with doppler for thrombosis, normal liver, mild ascites, small right pleural effusion. Acute hepatitis panel neg - NAC for 3 days completed (completed 3/30) -monitor  Thrombocytopenia - in setting of liver dysfunction, sepsis. Improving -no current indication for transfusion -monitor  Anemia of critical illness, dilution (almost 18L positive this admission) -needs aggressive diuresis  History of ETOH abuse - reportedly sober for over a year -vitamins  Hypokalemia -repleted  Best Practice (right click and "Reselect all SmartList Selections" daily)   Diet/type: tubefeeds DVT  prophylaxis other hold for thrombocytopenia  Pressure ulcer(s): N/A GI prophylaxis: H2B Lines: Central line Foley:  N/A Code Status:  full code Last date of multidisciplinary goals of care discussion [ ]   Updated family including mother on 3/30 on telephone  Critical care time:     This patient is critically ill with multiple organ system failure which requires frequent high complexity decision making, assessment, support, evaluation, and titration of therapies. This was completed through the application of advanced monitoring technologies and extensive interpretation of multiple databases. During this encounter critical care time was devoted to patient care services described in this note for 35 minutes.  Steffanie Dunn, DO 12/03/23 10:53 AM [facility]  For contact information, see Amion. If no response to pager, please call PCCM consult pager. After hours, 7PM- 7AM, please call Elink.

## 2023-12-03 NOTE — Progress Notes (Signed)
Vent changes made by Dr. Chestine Spore, CCM.

## 2023-12-03 NOTE — Progress Notes (Addendum)
 Subjective: Tmax one 101.7 Fahrenheit.  Tracheal cultures resent today.  Mother at bedside. On pressor support  ROS: Unable to obtain due to poor mental status  Examination  Vital signs in last 24 hours: Temp:  [98.8 F (37.1 C)-101.7 F (38.7 C)] 99.6 F (37.6 C) (03/31 0800) Pulse Rate:  [70-102] 71 (03/31 1000) Resp:  [0-30] 0 (03/31 1000) BP: (101-145)/(67-111) 113/78 (03/31 1000) SpO2:  [95 %-100 %] 99 % (03/31 1000) FiO2 (%):  [30 %] 30 % (03/31 0752) Weight:  [78.7 kg] 78.7 kg (03/31 0500)  General: lying in bed, intubated Neuro: Barely opens eyes to noxious stimulation, did wiggle her toes but not consistently, did not follow other commands, pupils equally round and reactive to light, no forced gaze deviation and did look at mother once in response to verbal stimulation but then did not track examiner in the room, withdraws to noxious stimuli in right upper extremity as well as right lower extremity, no movement in left upper and left lower extremity  Basic Metabolic Panel: Recent Labs  Lab 11/28/23 0454 11/28/23 0939 11/28/23 2236 11/29/23 0417 11/29/23 1640 11/30/23 0507 11/30/23 1536 12/01/23 0449 12/02/23 0459 12/03/23 0233 12/03/23 0601  NA  --    < > 134* 133*  --  133* 132* 134* 140 144 143  K  --    < > 3.5 3.4*  --  3.3* 3.0* 3.1* 3.9 4.0 4.1  CL  --    < > 103 101  --  101 103 105 112*  --  119*  CO2  --    < > 19* 19*  --  18* 17* 18* 18*  --  18*  GLUCOSE  --    < > 189* 206*  --  167* 178* 110* 140*  --  140*  BUN  --    < > 35* 36*  --  42* 45* 44* 43*  --  44*  CREATININE 2.38*   < > 3.06* 3.16*  --  3.26* 3.23* 2.87* 2.44*  --  1.73*  CALCIUM  --    < > 6.7* 6.8*  --  7.4* 7.3* 7.4* 8.0*  --  7.5*  MG 2.1  --  1.8 1.9 2.4  --   --   --   --   --   --   PHOS  --   --  3.7 3.9 3.2  --   --   --   --   --   --    < > = values in this interval not displayed.    CBC: Recent Labs  Lab 11/27/23 2058 11/28/23 0155 11/29/23 0417 11/30/23 0507  12/01/23 0449 12/02/23 0459 12/03/23 0233 12/03/23 0601  WBC 16.1*  16.5*   < > 14.7* 11.4* 7.0 7.6  --  6.5  NEUTROABS 13.8*  --   --   --   --   --   --   --   HGB 13.8  13.9   < > 13.0 12.7 10.5* 10.2* 8.8* 9.6*  HCT 43.6  43.8   < > 40.1 37.7 31.0* 30.9* 26.0* 30.2*  MCV 86.0  86.7   < > 83.4 81.3 81.8 83.1  --  86.3  PLT 197  186   < > 62* 46* 33* 42*  --  70*   < > = values in this interval not displayed.     Coagulation Studies: Recent Labs    12/01/23 0449 12/02/23 0459 12/03/23 0601  LABPROT 18.2* 15.1 14.4  INR 1.5* 1.2 1.1    Imaging No new brain imaging overnight   ASSESSMENT AND PLAN:  53 y.o. female with hx of alcohol and substance use, Htn, seizures on lamotrigine and topamax who was found seizing for unknown duration.  She was given Versed in the field with resolution of clinical seizure activity noted.  Obtunded mentation for several hours after arrival with no significant improvement.  CT head without contrast with no acute intracranial abnormality.  CT angio of the head and neck with no basilar thrombosis or LVO. EEG showed LPDS with likely ictal nature.   Status epilepticus convulsive, resolved Acute encephalopathy due to seizure Shock liver -improving Rhabdomyolysis - improving AKI -improving Cardiomyopathy with reduced EF -- likely secondary to stress Acute respiratory failure with hypoxia  Thrombocytopenia-stable Coagulopathy -improving - No further seizures, starting to open eyes and potentially follow 1 command but not consistently yet.   Recommendations -DC LTM EEG as no seizures over the weekend -Reduce clonazepam to 0.5 mg twice daily today and then in 3 days will reduce to 0.5 mg daily and then stop after last dose on 12/07/2023 -Increase lamotrigine to 200 mg twice daily starting tonight ( home dose is 250 mg twice daily) -Continue Vimpat 100 mg twice daily and Keppra 250 mg twice daily.  Will consider weaning off Keppra after 24 to 48  hours -Currently on Topamax 75 mg twice daily.  Will consider increasing to 100 mg twice daily depending on platelets - Continue seizure precautions - PRN IV ativan for clinical sz - Discussed plan with patient's mother at bedside   I have spent a total of  38  minutes with the patient reviewing hospital notes,  test results, labs and examining the patient as well as establishing an assessment and plan. > 50% of time was spent in direct patient care.          Lindie Spruce Epilepsy [facility] For questions after 5pm please refer to AMION to reach the Neurologist on call

## 2023-12-03 NOTE — Progress Notes (Signed)
 LTM EEG disconnected - skin breakdown noted at unhook at [facility], Fz, C3, F7, F8, F4. Nurse made aware.

## 2023-12-03 NOTE — Progress Notes (Signed)
 Nutrition Follow-up  DOCUMENTATION CODES:   Not applicable  INTERVENTION:   Continue tube feeding via Cortrak: Osmolite 1.5 at 50 ml/h (1200 ml per day) Prosource TF20 60 ml daily  Provides 1880 kcal, 95 gm protein, 914 ml free water daily  Continue MVI, folic acid, Thiamine daily   NUTRITION DIAGNOSIS:   Inadequate oral intake related to inability to eat as evidenced by NPO status.  - Still applicable   GOAL:   Patient will meet greater than or equal to 90% of their needs  - Meeting via TF's  MONITOR:   TF tolerance, I & O's, Vent status, Labs  REASON FOR ASSESSMENT:   Ventilator    ASSESSMENT:   Pt with hx of alcohol abuse, drug abuse, HTN, and HLD presented to ED after being found down at home with seizure-like activity.  3/25 - presented to ED, intubated 3/26 - Bedside LP  3/28 - Cortrak placed, tip gastric    Patient intubated and sedated on ventilator support. Has been tolerating TF's at goal. Was having loose stools and rectal pouch placed. Patient failed extubation trial yesterday. Does not follow commands. Sedation and pressors decreased today. On exam patient with moderate amounts of edema on lower and upper extremities. Will start diuresis today.   Still needs updated NFPE, exam was limited due to fluid. Also question accuracy of weights as patient would have lost 59 lbs, 25% in 7 months.. Suspect patient may be malnourished but will follow up to confirm diagnosis.   MV: 15.1 L/min Temp (24hrs), Avg:100.1 F (37.8 C), Min:98.2 F (36.8 C), Max:101.7 F (38.7 C)  MAP (cuff): 106 mmHg  Admit weight: 63 kg  Current weight: 78.7 kg    Intake/Output Summary (Last 24 hours) at 12/03/2023 1402 Last data filed at 12/03/2023 1300 Gross per 24 hour  Intake 2206.33 ml  Output 1275 ml  Net 931.33 ml   Net IO Since Admission: 18,329.94 mL [12/03/23 1402]  Drains/Lines: Rectal pouch 100 ml x 24 hours  UOP: 1475 ml x 24 hours   Nutritionally Relevant  Medications: Scheduled Meds:  docusate  100 mg Per Tube BID   famotidine  20 mg Per Tube Daily   feeding supplement (PROSource TF20)  60 mL Per Tube Daily   folic acid  1 mg Per Tube Daily   furosemide  60 mg Intravenous Q8H   insulin aspart  0-9 Units Subcutaneous Q4H   lamoTRIgine  150 mg Per Tube BID   multivitamin with minerals  1 tablet Per Tube Daily   [START ON 12/06/2023] thiamine (VITAMIN B1) injection  100 mg Intravenous Daily   Continuous Infusions:  cefTRIAXone (ROCEPHIN)  IV Stopped (12/03/23 1610)   dexmedetomidine (PRECEDEX) IV infusion 0.8 mcg/kg/hr (12/03/23 1300)   feeding supplement (OSMOLITE 1.5 CAL) 50 mL/hr at 12/03/23 1300   lacosamide (VIMPAT) IV Stopped (12/03/23 1100)   levETIRAcetam Stopped (12/03/23 0924)   norepinephrine (LEVOPHED) Adult infusion 2 mcg/min (12/03/23 1300)   thiamine (VITAMIN B1) injection Stopped (12/03/23 1002)    Labs Reviewed: BUN 44, Creatinine 1.73, Calcium 7.5, AST 304, ALT 538 CBG ranges from 96-189 mg/dL over the last 24 hours HgbA1c 5 (2025)  NUTRITION - FOCUSED PHYSICAL EXAM:  - Deferred to follow up due to fluid  Diet Order:   Diet Order             Diet NPO time specified  Diet effective now  EDUCATION NEEDS:   Not appropriate for education at this time  Skin:  Skin Assessment: Reviewed RN Assessment  Last BM:  3/30 - 100 ml x 24 hours via rectal pocuh, type 7  Height:   Ht Readings from Last 1 Encounters:  11/28/23 5' 2.01" (1.575 m)    Weight:   Wt Readings from Last 1 Encounters:  12/03/23 78.7 kg    Ideal Body Weight:  50 kg  BMI:  Body mass index is 31.73 kg/m.  Estimated Nutritional Needs:   Kcal:  1800-2000 kcal/d  Protein:  90-105 g/d  Fluid:  1.8-2L/d   Elliot Dally, RD Registered Dietitian  See Amion for more information

## 2023-12-03 NOTE — Progress Notes (Signed)
 Afternoon rounds:  Increased WOB, paradoxical but taking adequate Vt. Still on precedex 0.8. Changes made with RT-- changed back to [facility], ultimately looked most comfortable on longer I-time. Vt decreased to 400cc, RR increased to 24.  Steffanie Dunn, DO 12/03/23 4:11 PM [facility]  For contact information, see Amion. If no response to pager, please call PCCM consult pager. After hours, 7PM- 7AM, please call Elink.

## 2023-12-04 DIAGNOSIS — J690 Pneumonitis due to inhalation of food and vomit: Secondary | ICD-10-CM

## 2023-12-04 DIAGNOSIS — J9601 Acute respiratory failure with hypoxia: Secondary | ICD-10-CM | POA: Diagnosis not present

## 2023-12-04 DIAGNOSIS — G40901 Epilepsy, unspecified, not intractable, with status epilepticus: Secondary | ICD-10-CM | POA: Diagnosis not present

## 2023-12-04 DIAGNOSIS — R569 Unspecified convulsions: Secondary | ICD-10-CM | POA: Diagnosis not present

## 2023-12-04 DIAGNOSIS — A419 Sepsis, unspecified organism: Secondary | ICD-10-CM | POA: Diagnosis not present

## 2023-12-04 DIAGNOSIS — G9340 Encephalopathy, unspecified: Secondary | ICD-10-CM | POA: Diagnosis not present

## 2023-12-04 LAB — COMPREHENSIVE METABOLIC PANEL WITH GFR
ALT: 421 U/L — ABNORMAL HIGH (ref 0–44)
AST: 216 U/L — ABNORMAL HIGH (ref 15–41)
Albumin: 2.4 g/dL — ABNORMAL LOW (ref 3.5–5.0)
Alkaline Phosphatase: 49 U/L (ref 38–126)
Anion gap: 11 (ref 5–15)
BUN: 52 mg/dL — ABNORMAL HIGH (ref 6–20)
CO2: 21 mmol/L — ABNORMAL LOW (ref 22–32)
Calcium: 8.3 mg/dL — ABNORMAL LOW (ref 8.9–10.3)
Chloride: 111 mmol/L (ref 98–111)
Creatinine, Ser: 1.75 mg/dL — ABNORMAL HIGH (ref 0.44–1.00)
GFR, Estimated: 35 mL/min — ABNORMAL LOW (ref >60)
Glucose, Bld: 168 mg/dL — ABNORMAL HIGH (ref 70–99)
Potassium: 3.9 mmol/L (ref 3.5–5.1)
Sodium: 143 mmol/L (ref 135–145)
Total Bilirubin: 0.7 mg/dL (ref 0.0–1.2)
Total Protein: 4.6 g/dL — ABNORMAL LOW (ref 6.5–8.1)

## 2023-12-04 LAB — CBC
HCT: 31.3 % — ABNORMAL LOW (ref 36.0–46.0)
Hemoglobin: 10 g/dL — ABNORMAL LOW (ref 12.0–15.0)
MCH: 27.5 pg (ref 26.0–34.0)
MCHC: 31.9 g/dL (ref 30.0–36.0)
MCV: 86.2 fL (ref 80.0–100.0)
Platelets: 120 10*3/uL — ABNORMAL LOW (ref 150–400)
RBC: 3.63 MIL/uL — ABNORMAL LOW (ref 3.87–5.11)
RDW: 15.8 % — ABNORMAL HIGH (ref 11.5–15.5)
WBC: 8.1 10*3/uL (ref 4.0–10.5)
nRBC: 0 % (ref 0.0–0.2)

## 2023-12-04 LAB — CULTURE, RESPIRATORY W GRAM STAIN

## 2023-12-04 LAB — GLUCOSE, CAPILLARY
Glucose-Capillary: 141 mg/dL — ABNORMAL HIGH (ref 70–99)
Glucose-Capillary: 125 mg/dL — ABNORMAL HIGH (ref 70–99)
Glucose-Capillary: 145 mg/dL — ABNORMAL HIGH (ref 70–99)
Glucose-Capillary: 138 mg/dL — ABNORMAL HIGH (ref 70–99)
Glucose-Capillary: 159 mg/dL — ABNORMAL HIGH (ref 70–99)

## 2023-12-04 MED ORDER — PIPERACILLIN-TAZOBACTAM 3.375 G IVPB
3.3750 g | Freq: Three times a day (TID) | INTRAVENOUS | Status: DC
  Administered 2023-12-04 – 2023-12-10 (×18): 3.375 g via INTRAVENOUS
  Filled 2023-12-04 (×18): qty 50

## 2023-12-04 MED ORDER — OXYCODONE HCL 5 MG PO TABS
5.0000 mg | ORAL_TABLET | Freq: Four times a day (QID) | ORAL | Status: DC
  Administered 2023-12-04 – 2023-12-10 (×24): 5 mg
  Filled 2023-12-04 (×25): qty 1

## 2023-12-04 MED ORDER — HEPARIN SODIUM (PORCINE) 5000 UNIT/ML IJ SOLN
5000.0000 [IU] | Freq: Three times a day (TID) | INTRAMUSCULAR | Status: DC
Start: 2023-12-04 — End: 2024-01-02
  Administered 2023-12-04 – 2024-01-02 (×86): 5000 [IU] via SUBCUTANEOUS
  Filled 2023-12-04 (×87): qty 1

## 2023-12-04 MED ORDER — FUROSEMIDE 10 MG/ML IJ SOLN
60.0000 mg | Freq: Three times a day (TID) | INTRAMUSCULAR | Status: AC
  Administered 2023-12-04 (×2): 60 mg via INTRAVENOUS
  Filled 2023-12-04 (×3): qty 6

## 2023-12-04 MED ORDER — POTASSIUM CHLORIDE 20 MEQ PO PACK
40.0000 meq | PACK | Freq: Once | ORAL | Status: AC
  Administered 2023-12-04: 40 meq
  Filled 2023-12-04: qty 2

## 2023-12-04 NOTE — Progress Notes (Signed)
 Subjective: Tmax 102.5 Fahrenheit.   ROS: Unable to obtain due to poor mental status  Examination  Vital signs in last 24 hours: Temp:  [98.2 F (36.8 C)-102.5 F (39.2 C)] 102.3 F (39.1 C) (04/01 0800) Pulse Rate:  [65-110] 75 (04/01 0900) Resp:  [0-43] 17 (04/01 0900) BP: (85-163)/(39-116) 107/66 (04/01 0900) SpO2:  [82 %-100 %] 100 % (04/01 0900) FiO2 (%):  [30 %] 30 % (04/01 0820) Weight:  [74.8 kg] 74.8 kg (04/01 0500)  General: lying in bed, intubated Neuro: On Precedex, does not open eyes to noxious stimulation, does not follow commands, PERRLA, no forced gaze deviation, withdraws to noxious stimuli right upper and lower extremity, no movement in left upper and left lower extremity  Basic Metabolic Panel: Recent Labs  Lab 11/28/23 0454 11/28/23 0939 11/28/23 2236 11/29/23 0417 11/29/23 1640 11/30/23 0507 11/30/23 1536 12/01/23 0449 12/02/23 0459 12/03/23 0233 12/03/23 0601 12/04/23 0522  NA  --    < > 134* 133*  --    < > 132* 134* 140 144 143 143  K  --    < > 3.5 3.4*  --    < > 3.0* 3.1* 3.9 4.0 4.1 3.9  CL  --    < > 103 101  --    < > 103 105 112*  --  119* 111  CO2  --    < > 19* 19*  --    < > 17* 18* 18*  --  18* 21*  GLUCOSE  --    < > 189* 206*  --    < > 178* 110* 140*  --  140* 168*  BUN  --    < > 35* 36*  --    < > 45* 44* 43*  --  44* 52*  CREATININE 2.38*   < > 3.06* 3.16*  --    < > 3.23* 2.87* 2.44*  --  1.73* 1.75*  CALCIUM  --    < > 6.7* 6.8*  --    < > 7.3* 7.4* 8.0*  --  7.5* 8.3*  MG 2.1  --  1.8 1.9 2.4  --   --   --   --   --   --   --   PHOS  --   --  3.7 3.9 3.2  --   --   --   --   --   --   --    < > = values in this interval not displayed.    CBC: Recent Labs  Lab 11/27/23 2058 11/28/23 0155 11/30/23 0507 12/01/23 0449 12/02/23 0459 12/03/23 0233 12/03/23 0601 12/04/23 0522  WBC 16.1*  16.5*   < > 11.4* 7.0 7.6  --  6.5 8.1  NEUTROABS 13.8*  --   --   --   --   --   --   --   HGB 13.8  13.9   < > 12.7 10.5* 10.2*  8.8* 9.6* 10.0*  HCT 43.6  43.8   < > 37.7 31.0* 30.9* 26.0* 30.2* 31.3*  MCV 86.0  86.7   < > 81.3 81.8 83.1  --  86.3 86.2  PLT 197  186   < > 46* 33* 42*  --  70* 120*   < > = values in this interval not displayed.     Coagulation Studies: Recent Labs    12/02/23 0459 12/03/23 0601  LABPROT 15.1 14.4  INR 1.2 1.1  Imaging No new brain imaging overnight  ASSESSMENT AND PLAN:  53 y.o. female with hx of alcohol and substance use, Htn, seizures on lamotrigine and topamax who was found seizing for unknown duration.  She was given Versed in the field with resolution of clinical seizure activity noted.  Obtunded mentation for several hours after arrival with no significant improvement.  CT head without contrast with no acute intracranial abnormality.  CT angio of the head and neck with no basilar thrombosis or LVO. EEG showed LPDS with likely ictal nature.   Status epilepticus convulsive, resolved Acute encephalopathy due to seizure Shock liver -improving Rhabdomyolysis - improving AKI -improving Cardiomyopathy with reduced EF -- likely secondary to stress Acute respiratory failure with hypoxia  Thrombocytopenia-improving Coagulopathy -improving - No further seizures, starting to open eyes and potentially follow 1 command but not consistently yet.   Recommendations -On clonazepam taper. Last dose will be on 12/07/2023 -Continue lamotrigine 200 mg twice daily( home dose is 250 mg twice daily) -Continue Vimpat 100 mg twice daily and Keppra 250 mg twice daily.  Will consider weaning off Keppra tomorrow -Currently on Topamax 75 mg twice daily.  Will consider increasing to 100 mg twice daily tomorrow depending on platelets - Continue seizure precautions - PRN IV ativan for clinical sz - Discussed plan with patient's mother at bedside     I have spent a total of  35  minutes with the patient reviewing hospital notes,  test results, labs and examining the patient as well as  establishing an assessment and plan. > 50% of time was spent in direct patient care.    Lindie Spruce Epilepsy [facility] For questions after 5pm please refer to AMION to reach the Neurologist on call

## 2023-12-04 NOTE — Plan of Care (Signed)
  Problem: Health Behavior/Discharge Planning: Goal: Ability to manage health-related needs will improve Outcome: Progressing   Problem: Clinical Measurements: Goal: Ability to maintain clinical measurements within normal limits will improve Outcome: Progressing Goal: Will remain free from infection Outcome: Progressing Goal: Diagnostic test results will improve Outcome: Progressing Goal: Cardiovascular complication will be avoided Outcome: Progressing   Problem: Activity: Goal: Risk for activity intolerance will decrease Outcome: Progressing   Problem: Nutrition: Goal: Adequate nutrition will be maintained Outcome: Progressing   Problem: Elimination: Goal: Will not experience complications related to urinary retention Outcome: Progressing   Problem: Pain Managment: Goal: General experience of comfort will improve and/or be controlled Outcome: Progressing   Problem: Safety: Goal: Ability to remain free from injury will improve Outcome: Progressing   Problem: Skin Integrity: Goal: Risk for impaired skin integrity will decrease Outcome: Progressing   Problem: Respiratory: Goal: Ability to maintain a clear airway and adequate ventilation will improve Outcome: Progressing   Problem: Coping: Goal: Ability to adjust to condition or change in health will improve Outcome: Progressing   Problem: Fluid Volume: Goal: Ability to maintain a balanced intake and output will improve Outcome: Progressing   Problem: Health Behavior/Discharge Planning: Goal: Ability to manage health-related needs will improve Outcome: Progressing   Problem: Metabolic: Goal: Ability to maintain appropriate glucose levels will improve Outcome: Progressing   Problem: Nutritional: Goal: Maintenance of adequate nutrition will improve Outcome: Progressing Goal: Progress toward achieving an optimal weight will improve Outcome: Progressing   Problem: Skin Integrity: Goal: Risk for impaired skin  integrity will decrease Outcome: Progressing   Problem: Tissue Perfusion: Goal: Adequacy of tissue perfusion will improve Outcome: Progressing   Problem: Education: Goal: Knowledge of General Education information will improve Description: Including pain rating scale, medication(s)/side effects and non-pharmacologic comfort measures Outcome: Not Progressing   Problem: Clinical Measurements: Goal: Respiratory complications will improve Outcome: Not Progressing   Problem: Coping: Goal: Level of anxiety will decrease Outcome: Not Progressing   Problem: Elimination: Goal: Will not experience complications related to bowel motility Outcome: Not Progressing   Problem: Activity: Goal: Ability to tolerate increased activity will improve Outcome: Not Progressing   Problem: Role Relationship: Goal: Method of communication will improve Outcome: Not Progressing   Problem: Education: Goal: Ability to describe self-care measures that may prevent or decrease complications (Diabetes Survival Skills Education) will improve Outcome: Not Progressing Goal: Individualized Educational Video(s) Outcome: Not Progressing   Problem: Health Behavior/Discharge Planning: Goal: Ability to identify and utilize available resources and services will improve Outcome: Not Progressing

## 2023-12-04 NOTE — Progress Notes (Signed)
 Pharmacy Antibiotic Note  Meredith Park is a 53 y.o. female admitted on 11/27/2023 worsening fevers on ceftriaxone.  Pharmacy has been consulted for zosyn dosing.  Plan: Zosyn 3.375g IV q 8h (extended 4h infusion) Monitor TA Cx and clinical progression to narrow Monitor renal function  Height: 5\' 2"  (157.5 cm) Weight: 74.8 kg (164 lb 14.5 oz) IBW/kg (Calculated) : 50.1  Temp (24hrs), Avg:100.8 F (38.2 C), Min:98.2 F (36.8 C), Max:102.5 F (39.2 C)  Recent Labs  Lab 11/29/23 0417 11/29/23 1707 11/29/23 2105 11/30/23 0507 11/30/23 0743 11/30/23 1536 12/01/23 0449 12/02/23 0459 12/03/23 0601 12/04/23 0522  WBC 14.7*  --   --  11.4*  --   --  7.0 7.6 6.5 8.1  CREATININE 3.16*  --   --  3.26*  --  3.23* 2.87* 2.44* 1.73* 1.75*  LATICACIDVEN  --  3.0* 2.3*  --  2.3* 2.1* 1.5  --   --   --   VANCORANDOM 23  --   --   --   --   --   --   --   --   --     Estimated Creatinine Clearance: 35.6 mL/min (A) (by C-G formula based on SCr of 1.75 mg/dL (H)).    Allergies  Allergen Reactions   Meperidine Itching   Hydrochlorothiazide Other (See Comments)    Raises ammonia level   Keppra [Levetiracetam] Other (See Comments)    Behavioral, suicidal ideation     Daylene Posey, PharmD, [facility] Clinical Pharmacist ED Pharmacist Phone # [phone] 12/04/2023 8:44 AM

## 2023-12-04 NOTE — Progress Notes (Signed)
 NAME:  Meredith Park, MRN:  [id number], DOB:  08/28/1971, LOS: 7 ADMISSION DATE:  11/27/2023, CONSULTATION DATE: 3/25 REFERRING MD: Dr. Charm Barges EDP, CHIEF COMPLAINT: Seizure  History of Present Illness:  53 year old female with past medical history as below, which is significant for seizure disorder followed at [facility] neurology, alcohol abuse reportedly sober for 1 year, hypertension, marijuana use, and hyperlipidemia.  Seizures are managed with Lamictal and recently added topamax with better control of seizures.  Last seen by neurology 07/19/2023.  Family reports she did have a mixup with her seizure medications a week prior to arrival.  She ran out of one of her medications and did not realize it.  This has been remedied and she is back taking both medications.  The patient was in her usual state of health until 3/25 when she was found down beside her bed by her family at approximately 7 PM.  Last known well around 2:30 PM.  Her mother found her and noticed her leg shaking and she was unresponsive.  EMS was called upon EMS arrival she was noted to have continuous full body seizure as well as vomiting.  She was treated with 10 mg of IM Versed with cessation of seizures.  She remained minimally responsive in the emergency department.  She was hypoxic requiring 10 L of oxygen.  Chest x-ray was unremarkable.  CT of the head and C-spine were unremarkable for acute issues with the exception of findings associated with chronic paranasal sinusitis.  Due to poor responsiveness PCCM was asked to evaluate for ICU admission.  Pertinent  Medical History   has a past medical history of Alcohol abuse, Alcohol withdrawal (HCC) (08/28/2018), High cholesterol, Hypertension, and Seizures (HCC).   Significant Hospital Events: Including procedures, antibiotic start and stop dates in addition to other pertinent events   3/25 admit to ICU. Intubated 3/26 LP performed. Increasing pressor requirement 3/27 Increased  vasopressor requirement with worsening MOD including AKI, shock liver. Echo with stress CM. 3/29 Improved vasopressor requirements  Interim History / Subjective:  On higher dose precedex and received fentanyl bolus this morning due to air hunger on vent. Was able to follow commands on R side before this. Tmax 102.3.  Objective   Blood pressure 136/84, pulse 70, temperature (!) 102.3 F (39.1 C), temperature source Axillary, resp. rate (!) 28, height 5\' 2"  (1.575 m), weight 74.8 kg, last menstrual period 08/28/2020, SpO2 100%.    Vent Mode: PRVC FiO2 (%):  [30 %] 30 % Set Rate:  [24 bmp] 24 bmp Vt Set:  [400 mL] 400 mL PEEP:  [5 cmH20] 5 cmH20 Pressure Support:  [10 cmH20] 10 cmH20 Plateau Pressure:  [16 cmH20-17 cmH20] 17 cmH20   Intake/Output Summary (Last 24 hours) at 12/04/2023 0818 Last data filed at 12/04/2023 0700 Gross per 24 hour  Intake 2140.32 ml  Output 5910 ml  Net -3769.68 ml   Filed Weights   12/02/23 0500 12/03/23 0500 12/04/23 0500  Weight: 76.3 kg 78.7 kg 74.8 kg   Physical Exam: General: critically ill appearing woman lying in bed intubated, sedated HENT [place]/AT, eyes anicteric Respiratory: still head bobbing and appears air hungry, CTAB. Breathing over the vent. Did not stop with accessory muscle use until on PS 15/CPAP with tidal volumes ~900cc, came back when PS decreased. Cardiovascular: S1S2, RRR GI: soft, NT Extremities:  +edema, no cyanosis Neuro: RASS -5, not following commands Derm: warm, dry, some bruising  EEG: R frontotemporal, Left frontotempral sharp waves> independent epileptogenicity from bilateral  frontotemporal regions. R hemisphere cortical dysfunction. Moderate diffuse encephalopathy.  Resp culture: few GPC, rare GNR, few yeast> 3/31 resp culture: abundant PMNs, rare GPC in pairs, rare GNR, rare yeast with pseudohyphae Bicarb 21 BUN 52 Creatinine 1.75 AST 216 ALT 421 Bilirubin 0.7 WBC 8.1 H/H 10/31.3 Platelets 120  Resolved Hospital  Problem list     Assessment & Plan:   Status epilepticus: Home lamictal, topamax however recently off due to mix-up. CTA neg for LVO. CT spin no acute fx. Acute encephalopathy 2/2 above: MR Brain with findings c/w prolonged status epilepticus -cEEG-- can d/c today if Neurology no longer needs this, appreciate Neurology's management -con't lamictal (dose increased yesterday), vimpat, keppra, topamax.  -ativan PRN for clinical seizure -neuroprotective measures  Acute respiratory failure with hypoxia requiring mechanical ventilation Aspiration pneumonia, culture pending Air hunger -LTVV -VAP prevention protocol -PAD protocol -daily weaning efforts, but work of breathing has been a barrier to lightening precedex -follow sputum cultures -antibiotics escalated to zosyn with rising fevers  Septic shock secondary to aspiration. CNS infection ruled out.  -NE to maintain MAP >65 -diuresis -start zosyn, stop ceftriaxone  New onset cardiomyopathy with EF 40-45% and new LV WMA, suspect stress- induced Troponin elevation-- favor due to seizures, but no ischemia evaluation yet. Unlikely NSTEMI/ ACS. Echo newly depressed EF 40-45% with LV WMA, grade I DD, small pericardial effusion without tamponade History of hypertension -Appreciate Cardiology input. Not a candidate for cath due to critical illness. Likely stress induced CM -eventually needs repeat echo when stable   AKI 2/2 rhabdo, ATN, CIN- improved UOP, improved Cr Metabolic acidosis -maintain adequate perfusion -strict I/O -renally dose meds, avoid nephrotoxic meds  Rhabdo due to seizures - improving -recheck CK tomorrow  Transaminase elevation - improving, was likely related to rhabdo Elevated INR - normalized Abd [place] neg with doppler for thrombosis, normal liver, mild ascites, small right pleural effusion. Acute hepatitis panel neg -completed 3 days of NAC -monitor -GI signed off, appreciate their management  Thrombocytopenia  - in setting of liver dysfunction, sepsis. Improved. -monitor  Anemia of critical illness, dilution (almost 18L positive this admission) -con't diuresis  History of ETOH abuse - reportedly sober for over a year -con't vitamins -recommend cessation  Hypokalemia -repleted  Best Practice (right click and "Reselect all SmartList Selections" daily)   Diet/type: tubefeeds DVT prophylaxis prophylactic heparin    Pressure ulcer(s): N/A GI prophylaxis: H2B Lines: Central line Foley:  N/A Code Status:  full code Last date of multidisciplinary goals of care discussion [ ]   Updated family including mother on 3/30 on telephone  Critical care time:     This patient is critically ill with multiple organ system failure which requires frequent high complexity decision making, assessment, support, evaluation, and titration of therapies. This was completed through the application of advanced monitoring technologies and extensive interpretation of multiple databases. During this encounter critical care time was devoted to patient care services described in this note for 38 minutes.  Steffanie Dunn, DO 12/04/23 8:37 AM [facility]  For contact information, see Amion. If no response to pager, please call PCCM consult pager. After hours, 7PM- 7AM, please call Elink.

## 2023-12-05 ENCOUNTER — Inpatient Hospital Stay (HOSPITAL_COMMUNITY)

## 2023-12-05 DIAGNOSIS — R061 Stridor: Secondary | ICD-10-CM | POA: Diagnosis not present

## 2023-12-05 DIAGNOSIS — J690 Pneumonitis due to inhalation of food and vomit: Secondary | ICD-10-CM | POA: Diagnosis not present

## 2023-12-05 DIAGNOSIS — G40901 Epilepsy, unspecified, not intractable, with status epilepticus: Secondary | ICD-10-CM | POA: Diagnosis not present

## 2023-12-05 DIAGNOSIS — A419 Sepsis, unspecified organism: Secondary | ICD-10-CM | POA: Diagnosis not present

## 2023-12-05 DIAGNOSIS — J9601 Acute respiratory failure with hypoxia: Secondary | ICD-10-CM | POA: Diagnosis not present

## 2023-12-05 LAB — GLUCOSE, CAPILLARY
Glucose-Capillary: 116 mg/dL — ABNORMAL HIGH (ref 70–99)
Glucose-Capillary: 145 mg/dL — ABNORMAL HIGH (ref 70–99)
Glucose-Capillary: 146 mg/dL — ABNORMAL HIGH (ref 70–99)
Glucose-Capillary: 155 mg/dL — ABNORMAL HIGH (ref 70–99)
Glucose-Capillary: 158 mg/dL — ABNORMAL HIGH (ref 70–99)
Glucose-Capillary: 161 mg/dL — ABNORMAL HIGH (ref 70–99)
Glucose-Capillary: 212 mg/dL — ABNORMAL HIGH (ref 70–99)

## 2023-12-05 LAB — CULTURE, RESPIRATORY W GRAM STAIN: Culture: NORMAL

## 2023-12-05 LAB — POCT I-STAT 7, (LYTES, BLD GAS, ICA,H+H)
Acid-base deficit: 2 mmol/L (ref 0.0–2.0)
Bicarbonate: 23.2 mmol/L (ref 20.0–28.0)
Calcium, Ion: 1.19 mmol/L (ref 1.15–1.40)
HCT: 26 % — ABNORMAL LOW (ref 36.0–46.0)
Hemoglobin: 8.8 g/dL — ABNORMAL LOW (ref 12.0–15.0)
O2 Saturation: 99 %
Patient temperature: 98
Potassium: 4.4 mmol/L (ref 3.5–5.1)
Sodium: 144 mmol/L (ref 135–145)
TCO2: 24 mmol/L (ref 22–32)
pCO2 arterial: 37.3 mmHg (ref 32–48)
pH, Arterial: 7.4 (ref 7.35–7.45)
pO2, Arterial: 151 mmHg — ABNORMAL HIGH (ref 83–108)

## 2023-12-05 LAB — CBC
HCT: 30.2 % — ABNORMAL LOW (ref 36.0–46.0)
Hemoglobin: 9.4 g/dL — ABNORMAL LOW (ref 12.0–15.0)
MCH: 27.2 pg (ref 26.0–34.0)
MCHC: 31.1 g/dL (ref 30.0–36.0)
MCV: 87.3 fL (ref 80.0–100.0)
Platelets: 124 10*3/uL — ABNORMAL LOW (ref 150–400)
RBC: 3.46 MIL/uL — ABNORMAL LOW (ref 3.87–5.11)
RDW: 15.2 % (ref 11.5–15.5)
WBC: 7.3 10*3/uL (ref 4.0–10.5)
nRBC: 0 % (ref 0.0–0.2)

## 2023-12-05 LAB — BASIC METABOLIC PANEL WITH GFR
Anion gap: 14 (ref 5–15)
BUN: 60 mg/dL — ABNORMAL HIGH (ref 6–20)
CO2: 21 mmol/L — ABNORMAL LOW (ref 22–32)
Calcium: 8.1 mg/dL — ABNORMAL LOW (ref 8.9–10.3)
Chloride: 109 mmol/L (ref 98–111)
Creatinine, Ser: 1.71 mg/dL — ABNORMAL HIGH (ref 0.44–1.00)
GFR, Estimated: 36 mL/min — ABNORMAL LOW (ref >60)
Glucose, Bld: 139 mg/dL — ABNORMAL HIGH (ref 70–99)
Potassium: 3.9 mmol/L (ref 3.5–5.1)
Sodium: 144 mmol/L (ref 135–145)

## 2023-12-05 LAB — CK: Total CK: 3436 U/L — ABNORMAL HIGH (ref 38–234)

## 2023-12-05 MED ORDER — ETOMIDATE 2 MG/ML IV SOLN
INTRAVENOUS | Status: AC
  Filled 2023-12-05: qty 20

## 2023-12-05 MED ORDER — DEXAMETHASONE SODIUM PHOSPHATE 10 MG/ML IJ SOLN
10.0000 mg | Freq: Once | INTRAMUSCULAR | Status: AC
  Administered 2023-12-05: 10 mg via INTRAVENOUS
  Filled 2023-12-05: qty 1

## 2023-12-05 MED ORDER — ETOMIDATE 2 MG/ML IV SOLN
10.0000 mg | INTRAVENOUS | Status: AC
  Administered 2023-12-05: 10 mg via INTRAVENOUS

## 2023-12-05 MED ORDER — ROCURONIUM BROMIDE 10 MG/ML (PF) SYRINGE
50.0000 mg | PREFILLED_SYRINGE | Freq: Once | INTRAVENOUS | Status: AC
  Administered 2023-12-05: 50 mg via INTRAVENOUS

## 2023-12-05 MED ORDER — ORAL CARE MOUTH RINSE
15.0000 mL | OROMUCOSAL | Status: DC
  Administered 2023-12-05 – 2023-12-10 (×57): 15 mL via OROMUCOSAL

## 2023-12-05 MED ORDER — LORAZEPAM 2 MG/ML IJ SOLN
INTRAMUSCULAR | Status: AC
  Filled 2023-12-05: qty 1

## 2023-12-05 MED ORDER — RACEPINEPHRINE HCL 2.25 % IN NEBU
INHALATION_SOLUTION | RESPIRATORY_TRACT | Status: AC
  Administered 2023-12-05: 0.5 mL via RESPIRATORY_TRACT
  Filled 2023-12-05: qty 0.5

## 2023-12-05 MED ORDER — FUROSEMIDE 10 MG/ML IJ SOLN
60.0000 mg | Freq: Three times a day (TID) | INTRAMUSCULAR | Status: AC
  Administered 2023-12-05 (×2): 60 mg via INTRAVENOUS
  Filled 2023-12-05 (×2): qty 6

## 2023-12-05 MED ORDER — LEVALBUTEROL HCL 0.63 MG/3ML IN NEBU: 0.6300 mg | INHALATION_SOLUTION | Freq: Four times a day (QID) | RESPIRATORY_TRACT | Status: DC | PRN

## 2023-12-05 MED ORDER — DEXMEDETOMIDINE HCL IN NACL 400 MCG/100ML IV SOLN
0.0000 ug/kg/h | INTRAVENOUS | Status: DC
  Administered 2023-12-05: 0.4 ug/kg/h via INTRAVENOUS
  Administered 2023-12-06: 1.2 ug/kg/h via INTRAVENOUS
  Administered 2023-12-06 (×2): 0.8 ug/kg/h via INTRAVENOUS
  Administered 2023-12-06: 1.1 ug/kg/h via INTRAVENOUS
  Administered 2023-12-07: 1.2 ug/kg/h via INTRAVENOUS
  Administered 2023-12-07: 1 ug/kg/h via INTRAVENOUS
  Administered 2023-12-07 (×3): 1.2 ug/kg/h via INTRAVENOUS
  Administered 2023-12-07: 1 ug/kg/h via INTRAVENOUS
  Administered 2023-12-08 (×2): 1.1 ug/kg/h via INTRAVENOUS
  Administered 2023-12-08 (×2): 1.2 ug/kg/h via INTRAVENOUS
  Administered 2023-12-08: 0.9 ug/kg/h via INTRAVENOUS
  Administered 2023-12-09 – 2023-12-10 (×7): 1.2 ug/kg/h via INTRAVENOUS
  Filled 2023-12-05: qty 200
  Filled 2023-12-05 (×2): qty 100
  Filled 2023-12-05: qty 200
  Filled 2023-12-05 (×8): qty 100
  Filled 2023-12-05: qty 200
  Filled 2023-12-05 (×4): qty 100
  Filled 2023-12-05: qty 200
  Filled 2023-12-05 (×3): qty 100

## 2023-12-05 MED ORDER — RACEPINEPHRINE HCL 2.25 % IN NEBU: 0.5000 mL | INHALATION_SOLUTION | Freq: Once | RESPIRATORY_TRACT | Status: AC

## 2023-12-05 MED ORDER — ROCURONIUM BROMIDE 10 MG/ML (PF) SYRINGE
PREFILLED_SYRINGE | INTRAVENOUS | Status: AC
Start: 2023-12-05 — End: 2023-12-05
  Filled 2023-12-05: qty 10

## 2023-12-05 MED ORDER — FENTANYL CITRATE PF 50 MCG/ML IJ SOSY
50.0000 ug | PREFILLED_SYRINGE | INTRAMUSCULAR | Status: DC | PRN
  Administered 2023-12-05: 100 ug via INTRAVENOUS
  Administered 2023-12-06 (×3): 50 ug via INTRAVENOUS
  Administered 2023-12-06: 100 ug via INTRAVENOUS
  Administered 2023-12-07 – 2023-12-08 (×9): 50 ug via INTRAVENOUS
  Administered 2023-12-09: 100 ug via INTRAVENOUS
  Administered 2023-12-09 (×5): 50 ug via INTRAVENOUS
  Administered 2023-12-09 – 2023-12-10 (×2): 100 ug via INTRAVENOUS
  Administered 2023-12-10 (×2): 50 ug via INTRAVENOUS
  Filled 2023-12-05: qty 2
  Filled 2023-12-05 (×4): qty 1
  Filled 2023-12-05: qty 2
  Filled 2023-12-05 (×6): qty 1
  Filled 2023-12-05: qty 2
  Filled 2023-12-05 (×3): qty 1
  Filled 2023-12-05: qty 4
  Filled 2023-12-05: qty 2
  Filled 2023-12-05: qty 1
  Filled 2023-12-05 (×2): qty 2
  Filled 2023-12-05 (×3): qty 1

## 2023-12-05 MED ORDER — HYDROCORTISONE 1 % EX CREA
TOPICAL_CREAM | CUTANEOUS | Status: DC | PRN
  Filled 2023-12-05: qty 28

## 2023-12-05 MED ORDER — FENTANYL CITRATE PF 50 MCG/ML IJ SOSY
50.0000 ug | PREFILLED_SYRINGE | INTRAMUSCULAR | Status: AC | PRN
  Administered 2023-12-07 (×3): 50 ug via INTRAVENOUS
  Filled 2023-12-05 (×3): qty 1

## 2023-12-05 MED ORDER — POTASSIUM CHLORIDE 20 MEQ PO PACK
40.0000 meq | PACK | Freq: Once | ORAL | Status: AC
  Administered 2023-12-05: 40 meq
  Filled 2023-12-05: qty 2

## 2023-12-05 MED ORDER — METHYLPREDNISOLONE SODIUM SUCC 40 MG IJ SOLR
20.0000 mg | INTRAMUSCULAR | Status: AC
  Administered 2023-12-05 – 2023-12-06 (×4): 20 mg via INTRAVENOUS
  Filled 2023-12-05 (×4): qty 1

## 2023-12-05 MED ORDER — TOPIRAMATE 100 MG PO TABS
100.0000 mg | ORAL_TABLET | Freq: Every day | ORAL | Status: DC
  Administered 2023-12-05 – 2024-01-01 (×28): 100 mg
  Filled 2023-12-05: qty 1
  Filled 2023-12-05 (×2): qty 4
  Filled 2023-12-05 (×5): qty 1
  Filled 2023-12-05: qty 4
  Filled 2023-12-05 (×4): qty 1
  Filled 2023-12-05 (×4): qty 4
  Filled 2023-12-05: qty 1
  Filled 2023-12-05 (×3): qty 4
  Filled 2023-12-05 (×4): qty 1
  Filled 2023-12-05: qty 4
  Filled 2023-12-05 (×2): qty 1

## 2023-12-05 NOTE — Progress Notes (Signed)
 Subjective: Extubated. Still encephalopathic and not following commands  ROS: Unable to obtain due to poor mental status  Examination  Vital signs in last 24 hours: Temp:  [98.6 F (37 C)-99 F (37.2 C)] 98.6 F (37 C) (04/02 1600) Pulse Rate:  [60-192] 97 (04/02 2015) Resp:  [13-28] 22 (04/02 2015) BP: (83-188)/(45-102) 116/81 (04/02 2015) SpO2:  [91 %-100 %] 100 % (04/02 2015) FiO2 (%):  [30 %-100 %] 60 % (04/02 2015) Weight:  [74.6 kg] 74.6 kg (04/02 0500)   General: lying in bed, NAD Neuro: eyes open but doesn't track examiner and doesn't follow commands, right gaze preference, spontaneously moving RUE and RLE, no movement in LUE/LLE  Basic Metabolic Panel: Recent Labs  Lab 11/28/23 2236 11/29/23 0417 11/29/23 1640 11/30/23 0507 12/01/23 0449 12/02/23 0459 12/03/23 0233 12/03/23 0601 12/04/23 0522 12/05/23 0945  NA 134* 133*  --    < > 134* 140 144 143 143 144  K 3.5 3.4*  --    < > 3.1* 3.9 4.0 4.1 3.9 3.9  CL 103 101  --    < > 105 112*  --  119* 111 109  CO2 19* 19*  --    < > 18* 18*  --  18* 21* 21*  GLUCOSE 189* 206*  --    < > 110* 140*  --  140* 168* 139*  BUN 35* 36*  --    < > 44* 43*  --  44* 52* 60*  CREATININE 3.06* 3.16*  --    < > 2.87* 2.44*  --  1.73* 1.75* 1.71*  CALCIUM 6.7* 6.8*  --    < > 7.4* 8.0*  --  7.5* 8.3* 8.1*  MG 1.8 1.9 2.4  --   --   --   --   --   --   --   PHOS 3.7 3.9 3.2  --   --   --   --   --   --   --    < > = values in this interval not displayed.    CBC: Recent Labs  Lab 12/01/23 0449 12/02/23 0459 12/03/23 0233 12/03/23 0601 12/04/23 0522 12/05/23 0945  WBC 7.0 7.6  --  6.5 8.1 7.3  HGB 10.5* 10.2* 8.8* 9.6* 10.0* 9.4*  HCT 31.0* 30.9* 26.0* 30.2* 31.3* 30.2*  MCV 81.8 83.1  --  86.3 86.2 87.3  PLT 33* 42*  --  70* 120* 124*     Coagulation Studies: Recent Labs    12/03/23 0601  LABPROT 14.4  INR 1.1    Imaging No new brain imaging   ASSESSMENT AND PLAN: 53 y.o. female with hx of alcohol and  substance use, Htn, seizures on lamotrigine and topamax who was found seizing for unknown duration.  She was given Versed in the field with resolution of clinical seizure activity noted.  Obtunded mentation for several hours after arrival with no significant improvement.  CT head without contrast with no acute intracranial abnormality.  CT angio of the head and neck with no basilar thrombosis or LVO. EEG showed LPDS with likely ictal nature.   Status epilepticus convulsive, resolved Acute encephalopathy due to seizure Shock liver -improving Rhabdomyolysis - improving AKI -improving Cardiomyopathy with reduced EF -- likely secondary to stress Acute respiratory failure with hypoxia  Thrombocytopenia-improving Coagulopathy -improving - No further seizures, starting to open eyes and potentially follow 1 command but not consistently yet.   Recommendations -On clonazepam taper. Last dose will be  on 12/07/2023 - DC keppra and increase topamax to 100mg  daily -Continue lamotrigine 200 mg twice daily( home dose is 250 mg twice daily) -Continue Vimpat 100 mg twice daily  - Continue seizure precautions - PRN IV ativan for clinical sz - Discussed plan with RN who updated patient's family    I have spent a total of  37  minutes with the patient reviewing hospital notes,  test results, labs and examining the patient as well as establishing an assessment and plan. > 50% of time was spent in direct patient care.    Lindie Spruce Epilepsy [facility] For questions after 5pm please refer to AMION to reach the Neurologist on call

## 2023-12-05 NOTE — Significant Event (Signed)
 Called to bedside for increased work of breathing. On exam, patient is stridorous, belly breathing, moving minimal air. RT is at bedside. Giving racemic epi. I ordered Decadron 10mg .   Stridor did not improve with racemic epi and patient not ideal candidate for PPV with thick tenacious secretions and weak cough/mental status. Decision make to re-intubate patient for airway protection. Dr. Lonzo Candy was called to bedside and intubated patient. Please see procedure note. I also called pt mother and sister and informed them of decision to reintubate, they are agreeable.

## 2023-12-05 NOTE — TOC CM/SW Note (Signed)
 Transition of Care [facility]) - Inpatient Brief Assessment   Patient Details  Name: Meredith Park MRN: [id number] Date of Birth: 09/15/1970  Transition of Care [facility]) CM/SW Contact:    Mearl Latin, LCSW Phone Number: 12/05/2023, 12:58 PM   Clinical Narrative: CSW received request to speak with patient's family regarding disability. CSW spoke with sister and mother and answered questions. They are working with patient's job to apply for short term disability. CSW advised them to bring in needed forms for MD to sign if needed. They have a lawyer working on it as well.   Transition of Care Asessment: Insurance and Status: Insurance coverage has been reviewed Patient has primary care physician: Yes [facility]) Home environment has been reviewed: Lives with family Prior level of function:: Independent Prior/Current Home Services: No current home services Social Drivers of Health Review: SDOH reviewed no interventions necessary Readmission risk has been reviewed: Yes Transition of care needs: transition of care needs identified, TOC will continue to follow

## 2023-12-05 NOTE — Procedures (Signed)
 Intubation Procedure Note  Meredith Park  [id number]  [birth year]  Date:12/05/23  Time:7:00 PM   Provider Performing:Chanelle Hodsdon Allen Norris    Procedure: Intubation (31500)  Indication(s) Respiratory Failure  Consent Unable to obtain consent due to emergent nature of procedure.   Anesthesia Etomidate and Rocuronium   Time Out Verified patient identification, verified procedure, site/side was marked, verified correct patient position, special equipment/implants available, medications/allergies/relevant history reviewed, required imaging and test results available.   Sterile Technique Usual hand hygeine, masks, and gloves were used   Procedure Description Patient positioned in bed supine.  Sedation given as noted above.  Patient was intubated with 7.5 endotracheal tube using Glidescope.  View was Grade 2 only posterior commissure .  Number of attempts was 1.  Colorimetric CO2 detector was consistent with tracheal placement. 24 cm at the teeth    Complications/Tolerance None; patient tolerated the procedure well. Chest X-ray is ordered to verify placement.   EBL Minimal   Specimen(s) None

## 2023-12-05 NOTE — Progress Notes (Signed)
 NAME:  Meredith Park, MRN:  [id number], DOB:  05-08-1971, LOS: 8 ADMISSION DATE:  11/27/2023, CONSULTATION DATE: 3/25 REFERRING MD: Dr. Charm Barges EDP, CHIEF COMPLAINT: Seizure  History of Present Illness:  53 year old female with past medical history as below, which is significant for seizure disorder followed at [facility] neurology, alcohol abuse reportedly sober for 1 year, hypertension, marijuana use, and hyperlipidemia.  Seizures are managed with Lamictal and recently added topamax with better control of seizures.  Last seen by neurology 07/19/2023.  Family reports she did have a mixup with her seizure medications a week prior to arrival.  She ran out of one of her medications and did not realize it.  This has been remedied and she is back taking both medications.  The patient was in her usual state of health until 3/25 when she was found down beside her bed by her family at approximately 7 PM.  Last known well around 2:30 PM.  Her mother found her and noticed her leg shaking and she was unresponsive.  EMS was called upon EMS arrival she was noted to have continuous full body seizure as well as vomiting.  She was treated with 10 mg of IM Versed with cessation of seizures.  She remained minimally responsive in the emergency department.  She was hypoxic requiring 10 L of oxygen.  Chest x-ray was unremarkable.  CT of the head and C-spine were unremarkable for acute issues with the exception of findings associated with chronic paranasal sinusitis.  Due to poor responsiveness PCCM was asked to evaluate for ICU admission.  Pertinent  Medical History   has a past medical history of Alcohol abuse, Alcohol withdrawal (HCC) (08/28/2018), High cholesterol, Hypertension, and Seizures (HCC).   Significant Hospital Events: Including procedures, antibiotic start and stop dates in addition to other pertinent events   3/25 admit to ICU. Intubated 3/26 LP performed. Increasing pressor requirement 3/27 Increased  vasopressor requirement with worsening MOD including AKI, shock liver. Echo with stress CM. 3/29 Improved vasopressor requirements 4/2 central line d/c  Interim History / Subjective:  Tmax 102.7 yesterday, placed on cooling blanket. Central line displaced this morning, found in the bed, possibly self discontinued.   Objective   Blood pressure (!) 99/49, pulse 66, temperature 99 F (37.2 C), temperature source Esophageal, resp. rate 14, height 5' 2.01" (1.575 m), weight 74.6 kg, last menstrual period 08/28/2020, SpO2 100%.    Vent Mode: PRVC FiO2 (%):  [30 %] 30 % Set Rate:  [24 bmp] 24 bmp Vt Set:  [400 mL] 400 mL PEEP:  [5 cmH20] 5 cmH20 Pressure Support:  [8 cmH20] 8 cmH20   Intake/Output Summary (Last 24 hours) at 12/05/2023 0839 Last data filed at 12/05/2023 0700 Gross per 24 hour  Intake 2050.06 ml  Output 4475 ml  Net -2424.94 ml   Filed Weights   12/03/23 0500 12/04/23 0500 12/05/23 0500  Weight: 78.7 kg 74.8 kg 74.6 kg   Physical Exam: General: critically ill appearing woman lying in bed intubated, lightly sedated HENT /AT, ETT in place. Respiratory: not appearing air hungry today, getting Vt 500 on PS 8cc, still has fairly copious thin tan secretions.  Cardiovascular: S1S2, RRR GI: Soft, nontender Extremities: Mild edema, no cyanosis Neuro: RASS 0, able to follow commands on the right side.  Attention span seems short.  Not moving her left side.  Spontaneously moving her right frequently. Derm: Warm, dry.  Scattered areas of erythematous skin from previous bandages around her chest.  EEG: R frontotemporal,  Left frontotempral sharp waves> independent epileptogenicity from bilateral frontotemporal regions. R hemisphere cortical dysfunction. Moderate diffuse encephalopathy.  Resp culture: few GPC, rare GNR, few yeast>normal flora 3/31 resp culture: abundant PMNs, rare GPC in pairs, rare GNR, rare yeast with pseudohyphae>    CK 3436  Resolved Hospital Problem list      Assessment & Plan:   Status epilepticus: Home lamictal, topamax however recently off due to mix-up. CTA neg for LVO. CT spin no acute fx. Acute encephalopathy 2/2 above: MR Brain with findings c/w prolonged status epilepticus -Can discontinue continuous EEG if no longer required - Continue Lamictal, Topamax, Vimpat and Keppra twice daily.  Eventually may be weaned off Keppra-Per neurology. -Weaning clonazepam; last dose 4/4 -Seizure precautions, aggressively treat fevers - Ativan as needed for clinical seizures  Acute respiratory failure with hypoxia requiring mechanical ventilation Aspiration pneumonia, culture pending Air hunger -LTVV - VAP prevention protocol - PAD protocol for sedation - Continue following trach aspirate cultures, one sent on 3/31 - Continue antibiotics-escalated to Zosyn on 4/1 -Hopeful for extubation today. Volume of secretions would be my only worry, but she has a strong cough and her mentation has improved.  Septic shock secondary to aspiration. CNS infection ruled out.  -Norepinephrine as required to attain MAP greater than 65.  He longer has a central line, but on the enough doses that she could use this peripherally.  Suspect without Precedex she will no longer need norepinephrine. - Continue Zosyn -Continue diuresis; BMP pending  New onset cardiomyopathy with EF 40-45% and new LV WMA, suspect stress- induced Troponin elevation-- favor due to seizures, but no ischemia evaluation yet. Unlikely NSTEMI/ ACS. Echo newly depressed EF 40-45% with LV WMA, grade I DD, small pericardial effusion without tamponade History of hypertension -Appreciate Cardiology input. Not a candidate for cath due to critical illness. Likely stress induced CM -Eventually needs repeat echocardiogram when more stable to determine if she still requires GDMT  AKI 2/2 rhabdo, ATN, CIN- improved UOP, improved Cr Metabolic acidosis -Maintain adequate perfusion - Strict I's/O -  Repeat BMP ordered - Renally dose meds and avoid nephrotoxic meds.  Rhabdo due to seizures -continues to improve -Recheck CK periodically  Transaminase elevation - improving, was likely related to rhabdo Elevated INR - normalized Abd [place] neg with doppler for thrombosis, normal liver, mild ascites, small right pleural effusion. Acute hepatitis panel neg -completed 3 days of NAC -Monitor.  GI signed off.  Appreciate their management.  Thrombocytopenia - in setting of liver dysfunction, sepsis. Improved. -Repeat CBC pending  Anemia of critical illness, dilution (almost 18L positive this admission) -Continue diuresis  History of ETOH abuse - reportedly sober for over a year -Continue vitamins, recommend cessation.  Will counsel on this when she is able to participate  Hypokalemia -Empiric potassium supplementation today.  Will recheck on BMP today.  Best Practice (right click and "Reselect all SmartList Selections" daily)   Diet/type: tubefeeds DVT prophylaxis prophylactic heparin    Pressure ulcer(s): N/A GI prophylaxis: H2B Lines: N/A Foley:  N/A Code Status:  full code Last date of multidisciplinary goals of care discussion [ ]   Updated family including mother on 3/30 on telephone  Critical care time:     This patient is critically ill with multiple organ system failure which requires frequent high complexity decision making, assessment, support, evaluation, and titration of therapies. This was completed through the application of advanced monitoring technologies and extensive interpretation of multiple databases. During this encounter critical care time was devoted  to patient care services described in this note for 41 minutes.  Steffanie Dunn, DO 12/05/23 9:50 AM [facility]  For contact information, see Amion. If no response to pager, please call PCCM consult pager. After hours, 7PM- 7AM, please call Elink.

## 2023-12-05 NOTE — Procedures (Signed)
 Extubation Procedure Note  Patient Details:   Name: Meredith Park DOB: 08/19/1971 MRN: [id number]   Airway Documentation:    Vent end date: 12/05/23 Vent end time: 1142   Evaluation  O2 sats: stable throughout Complications: No apparent complications Patient did tolerate procedure well. Bilateral Breath Sounds: Diminished, Rhonchi   Yes  Patient extubated to 2L [place] per MD order.  Positive cuff leak noted.  No evidence of stridor.  Patient's sats, and vitals, are stable at this time.  No complications noted.  Will continue to monitor.   Elyn Peers 12/05/2023, 11:44 AM

## 2023-12-06 DIAGNOSIS — E870 Hyperosmolality and hypernatremia: Secondary | ICD-10-CM

## 2023-12-06 DIAGNOSIS — R739 Hyperglycemia, unspecified: Secondary | ICD-10-CM

## 2023-12-06 DIAGNOSIS — R061 Stridor: Secondary | ICD-10-CM

## 2023-12-06 DIAGNOSIS — I429 Cardiomyopathy, unspecified: Secondary | ICD-10-CM | POA: Diagnosis not present

## 2023-12-06 DIAGNOSIS — A419 Sepsis, unspecified organism: Secondary | ICD-10-CM | POA: Diagnosis not present

## 2023-12-06 DIAGNOSIS — G40901 Epilepsy, unspecified, not intractable, with status epilepticus: Secondary | ICD-10-CM | POA: Diagnosis not present

## 2023-12-06 DIAGNOSIS — J690 Pneumonitis due to inhalation of food and vomit: Secondary | ICD-10-CM | POA: Diagnosis not present

## 2023-12-06 LAB — VOLATILES,BLD-ACETONE,ETHANOL,ISOPROP,METHANOL
Acetone, blood: <0.01 g/dL (ref 0.000–0.010)
Ethanol, blood: <0.01 g/dL (ref 0.000–0.010)
Isopropanol, blood: <0.01 g/dL (ref 0.000–0.010)
Methanol, blood: <0.01 g/dL (ref 0.000–0.010)

## 2023-12-06 LAB — BASIC METABOLIC PANEL WITH GFR
Anion gap: 17 — ABNORMAL HIGH (ref 5–15)
BUN: 64 mg/dL — ABNORMAL HIGH (ref 6–20)
CO2: 22 mmol/L (ref 22–32)
Calcium: 8.5 mg/dL — ABNORMAL LOW (ref 8.9–10.3)
Chloride: 107 mmol/L (ref 98–111)
Creatinine, Ser: 1.72 mg/dL — ABNORMAL HIGH (ref 0.44–1.00)
GFR, Estimated: 35 mL/min — ABNORMAL LOW (ref >60)
Glucose, Bld: 188 mg/dL — ABNORMAL HIGH (ref 70–99)
Potassium: 4.3 mmol/L (ref 3.5–5.1)
Sodium: 146 mmol/L — ABNORMAL HIGH (ref 135–145)

## 2023-12-06 LAB — GLUCOSE, CAPILLARY
Glucose-Capillary: 130 mg/dL — ABNORMAL HIGH (ref 70–99)
Glucose-Capillary: 160 mg/dL — ABNORMAL HIGH (ref 70–99)
Glucose-Capillary: 183 mg/dL — ABNORMAL HIGH (ref 70–99)
Glucose-Capillary: 195 mg/dL — ABNORMAL HIGH (ref 70–99)
Glucose-Capillary: 200 mg/dL — ABNORMAL HIGH (ref 70–99)
Glucose-Capillary: 219 mg/dL — ABNORMAL HIGH (ref 70–99)

## 2023-12-06 MED ORDER — FREE WATER
200.0000 mL | Status: DC
  Administered 2023-12-06 – 2023-12-09 (×18): 200 mL

## 2023-12-06 MED ORDER — FUROSEMIDE 10 MG/ML IJ SOLN
60.0000 mg | Freq: Three times a day (TID) | INTRAMUSCULAR | Status: AC
  Administered 2023-12-06 (×2): 60 mg via INTRAVENOUS
  Filled 2023-12-06 (×2): qty 6

## 2023-12-06 MED ORDER — PROSOURCE TF20 ENFIT COMPATIBL EN LIQD
60.0000 mL | Freq: Two times a day (BID) | ENTERAL | Status: DC
  Administered 2023-12-06 – 2023-12-24 (×36): 60 mL
  Filled 2023-12-06 (×36): qty 60

## 2023-12-06 MED ORDER — DEXAMETHASONE SODIUM PHOSPHATE 4 MG/ML IJ SOLN
4.0000 mg | Freq: Once | INTRAMUSCULAR | Status: AC
  Administered 2023-12-06: 4 mg via INTRAVENOUS
  Filled 2023-12-06: qty 1

## 2023-12-06 MED ORDER — INSULIN GLARGINE-YFGN 100 UNIT/ML ~~LOC~~ SOLN
8.0000 [IU] | Freq: Once | SUBCUTANEOUS | Status: AC
  Administered 2023-12-06: 8 [IU] via SUBCUTANEOUS
  Filled 2023-12-06: qty 0.08

## 2023-12-06 NOTE — Progress Notes (Signed)
 NAME:  Meredith Park, MRN:  [id number], DOB:  September 15, 1970, LOS: 9 ADMISSION DATE:  11/27/2023, CONSULTATION DATE: 3/25 REFERRING MD: Dr. Charm Barges EDP, CHIEF COMPLAINT: Seizure  History of Present Illness:  53 year old female with past medical history as below, which is significant for seizure disorder followed at [facility] neurology, alcohol abuse reportedly sober for 1 year, hypertension, marijuana use, and hyperlipidemia.  Seizures are managed with Lamictal and recently added topamax with better control of seizures.  Last seen by neurology 07/19/2023.  Family reports she did have a mixup with her seizure medications a week prior to arrival.  She ran out of one of her medications and did not realize it.  This has been remedied and she is back taking both medications.  The patient was in her usual state of health until 3/25 when she was found down beside her bed by her family at approximately 7 PM.  Last known well around 2:30 PM.  Her mother found her and noticed her leg shaking and she was unresponsive.  EMS was called upon EMS arrival she was noted to have continuous full body seizure as well as vomiting.  She was treated with 10 mg of IM Versed with cessation of seizures.  She remained minimally responsive in the emergency department.  She was hypoxic requiring 10 L of oxygen.  Chest x-ray was unremarkable.  CT of the head and C-spine were unremarkable for acute issues with the exception of findings associated with chronic paranasal sinusitis.  Due to poor responsiveness PCCM was asked to evaluate for ICU admission.  Pertinent  Medical History   has a past medical history of Alcohol abuse, Alcohol withdrawal (HCC) (08/28/2018), High cholesterol, Hypertension, and Seizures (HCC).   Significant Hospital Events: Including procedures, antibiotic start and stop dates in addition to other pertinent events   3/25 admit to ICU. Intubated 3/26 LP performed. Increasing pressor requirement 3/27 Increased  vasopressor requirement with worsening MOD including AKI, shock liver. Echo with stress CM. 3/29 Improved vasopressor requirements 4/2 central line d/c, extubated (had cuff leak), reintubated for stridor & secretions several hours later  Interim History / Subjective:  This morning mildly agitated.  Objective   Blood pressure 113/74, pulse 68, temperature 99.7 F (37.6 C), temperature source Axillary, resp. rate 20, height 5' 2.01" (1.575 m), weight 74.5 kg, last menstrual period 08/28/2020, SpO2 100%.    Vent Mode: PRVC FiO2 (%):  [30 %-100 %] 40 % Set Rate:  [20 bmp-24 bmp] 20 bmp Vt Set:  [400 mL] 400 mL PEEP:  [5 cmH20] 5 cmH20 Pressure Support:  [10 cmH20] 10 cmH20 Plateau Pressure:  [12 cmH20-13 cmH20] 13 cmH20   Intake/Output Summary (Last 24 hours) at 12/06/2023 0724 Last data filed at 12/06/2023 0600 Gross per 24 hour  Intake 1731.8 ml  Output 3795 ml  Net -2063.2 ml   Filed Weights   12/04/23 0500 12/05/23 0500 12/06/23 0500  Weight: 74.8 kg 74.6 kg 74.5 kg   Physical Exam: General: critically ill appearing woman lying in bed intubated, lightly sedated HENT [place]/AT, eyes anicteric Respiratory: breathing comfortably on MV, no rhonchi. Minimal ETT secretions.  Cardiovascular: S1S2, RRR GI: soft, NT Extremities: improving edema, no cyanosis Neuro: RASS 0, moving R side, not moving left.  Derm: warm, dry, no diffuse rashes  Resp culture: few GPC, rare GNR, few yeast>normal flora 3/31 resp culture: abundant PMNs, rare GPC in pairs, rare GNR, rare yeast with pseudohyphae> normal flora  Na+ 146 BUN 64 Cr 1.72  Resolved  Hospital Problem list   Hypokalemia  Assessment & Plan:   Status epilepticus: Home lamictal, topamax however recently off due to mix-up. CTA neg for LVO. CT spin no acute fx. Acute encephalopathy 2/2 above: MR Brain with findings c/w prolonged status epilepticus -con't lamictal, topamax, vimpat, keppra. Last dose of clonazepam wean 4/4. -seizure  precautions -treat fevers aggressively -ativan PRN for clinical seizures  Acute respiratory failure with hypoxia requiring mechanical ventilation Aspiration pneumonia, culture pending Air hunger Post-extubation stridor, delayed; reintubated with 7.5 ETT -LTVV -VAP prevention protocol -PAD protocol -steroids, con't antibiotics to help reduce secretion burden. Would not consider extubation today, but possibly tomorrow. If she fails due to stridor again despite steroids, she would likely require trach at that time to give VC time to improve.  -complete 7 days of escalated antibiotics (4/1-4/7) due to failure to respond to previous regimen, despite all cultures showing normal flora. Sputum volume finally decreasing and fevers now defervesced.   Septic shock secondary to aspiration pneumonia. CNS infection ruled out.  -NE as required with sedation; goal MAP >65 - zosyn for 7 days -con't diuresis today; she is still about 10L positive  New onset cardiomyopathy with EF 40-45% and new LV WMA, suspect stress- induced Troponin elevation-- favor due to seizures, but no ischemia evaluation yet. Unlikely NSTEMI/ ACS. Echo newly depressed EF 40-45% with LV WMA, grade I DD, small pericardial effusion without tamponade History of hypertension -Appreciate Cardiology input. Not a candidate for cath due to critical illness. Likely stress induced CM -eventually needs repeat echo when more stable to determine if she has recovered or required GDMT  AKI 2/2 rhabdo, ATN, CIN- improved UOP, improved Cr Metabolic acidosis hypernatremia -con't diuresis -maintain adequate perfusion -strict I/O -renally dose meds, avoid nephrotoxic meds -increase FWF  Rhabdo due to seizures -continues to improve -can recheck periodically  Transaminase elevation - improving, was likely related to rhabdo rather than GI pathology Elevated INR - normalized Abd [place] neg with doppler for thrombosis, normal liver, mild ascites,  small right pleural effusion. Acute hepatitis panel neg -completed 3 days of NAC -monitor periodically; GI has signed off  Thrombocytopenia - in setting of liver dysfunction, sepsis. Improved. -recheck tomorrow  Anemia of critical illness, dilution (almost 18L positive this admission) -diuresis -limiting lab draws as appropriate  History of ETOH abuse - reportedly sober for over a year -vitamins -recommend cessation  Hyperglycemia due to steroids -SSI PRN -glargine 8 units once since she is getting steroids today -goal BG 140-180  Best Practice (right click and "Reselect all SmartList Selections" daily)   Diet/type: tubefeeds DVT prophylaxis prophylactic heparin    Pressure ulcer(s): N/A GI prophylaxis: H2B Lines: N/A Foley:  N/A Code Status:  full code Last date of multidisciplinary goals of care discussion [ ]   Updated family including mother on 3/30 on telephone  Critical care time:     This patient is critically ill with multiple organ system failure which requires frequent high complexity decision making, assessment, support, evaluation, and titration of therapies. This was completed through the application of advanced monitoring technologies and extensive interpretation of multiple databases. During this encounter critical care time was devoted to patient care services described in this note for 40 minutes.  Steffanie Dunn, DO 12/06/23 8:46 AM [facility]  For contact information, see Amion. If no response to pager, please call PCCM consult pager. After hours, 7PM- 7AM, please call Elink.

## 2023-12-06 NOTE — Progress Notes (Signed)
 PT Cancellation Note  Patient Details Name: Meredith Park MRN: [id number] DOB: [birth year]   Cancelled Treatment:    Reason Eval/Treat Not Completed: (P) Patient not medically ready. Pt sedated and intubated. Will plan to follow-up another day as able.   Virgil Benedict, PT, DPT Acute Rehabilitation Services  Office: [phone]    Bettina Gavia 12/06/2023, 8:36 AM

## 2023-12-06 NOTE — Progress Notes (Addendum)
 Subjective: Had to be reintubated yesterday due to stridor.  On steroids now.  Does follow commands but still not tracking examiner in room  ROS: Unable to obtain due to poor mental status  Examination  Vital signs in last 24 hours: Temp:  [98.6 F (37 C)-99.7 F (37.6 C)] 99 F (37.2 C) (04/03 0800) Pulse Rate:  [65-144] 96 (04/03 0805) Resp:  [13-25] 22 (04/03 0805) BP: (77-156)/(52-108) 111/74 (04/03 0805) SpO2:  [94 %-100 %] 100 % (04/03 0805) FiO2 (%):  [40 %-100 %] 40 % (04/03 0805) Weight:  [74.5 kg] 74.5 kg (04/03 0500)  General: lying in bed, intubated Neuro: Opens eyes to verbal stimulation, follows simple one-step commands like raising her arm and wiggling her toes, pupils equally round and reactive, does not have any forced gaze deviation but not tracking examiner in room, antigravity strength in right upper and right lower extremity, today had withdrawal in left lower extremity, no movement in left upper extremity yet  Basic Metabolic Panel: Recent Labs  Lab 11/29/23 1640 11/30/23 0507 12/02/23 0459 12/03/23 0233 12/03/23 0601 12/04/23 0522 12/05/23 0945 12/05/23 2104 12/06/23 0543  NA  --    < > 140   < > 143 143 144 144 146*  K  --    < > 3.9   < > 4.1 3.9 3.9 4.4 4.3  CL  --    < > 112*  --  119* 111 109  --  107  CO2  --    < > 18*  --  18* 21* 21*  --  22  GLUCOSE  --    < > 140*  --  140* 168* 139*  --  188*  BUN  --    < > 43*  --  44* 52* 60*  --  64*  CREATININE  --    < > 2.44*  --  1.73* 1.75* 1.71*  --  1.72*  CALCIUM  --    < > 8.0*  --  7.5* 8.3* 8.1*  --  8.5*  MG 2.4  --   --   --   --   --   --   --   --   PHOS 3.2  --   --   --   --   --   --   --   --    < > = values in this interval not displayed.    CBC: Recent Labs  Lab 12/01/23 0449 12/02/23 0459 12/03/23 0233 12/03/23 0601 12/04/23 0522 12/05/23 0945 12/05/23 2104  WBC 7.0 7.6  --  6.5 8.1 7.3  --   HGB 10.5* 10.2* 8.8* 9.6* 10.0* 9.4* 8.8*  HCT 31.0* 30.9* 26.0* 30.2*  31.3* 30.2* 26.0*  MCV 81.8 83.1  --  86.3 86.2 87.3  --   PLT 33* 42*  --  70* 120* 124*  --      Coagulation Studies: No results for input(s): "LABPROT", "INR" in the last 72 hours.  Imaging No new brain imaging overnight  ASSESSMENT AND PLAN: 53 y.o. female with hx of alcohol and substance use, Htn, seizures on lamotrigine and topamax who was found seizing for unknown duration.  She was given Versed in the field with resolution of clinical seizure activity noted.  Obtunded mentation for several hours after arrival with no significant improvement.  CT head without contrast with no acute intracranial abnormality.  CT angio of the head and neck with no basilar thrombosis or LVO. EEG  showed LPDS with likely ictal nature.   Status epilepticus convulsive, resolved Acute encephalopathy due to seizure Shock liver -improving Rhabdomyolysis - improving AKI -improving Cardiomyopathy with reduced EF -- likely secondary to stress Acute respiratory failure with hypoxia  Thrombocytopenia-improving Coagulopathy -improving -Mental status gradually improving.  Consistently following commands now.  Recommendations -On clonazepam taper. Last dose will be tomorrow  -Continue topamax to 100mg  daily, lamotrigine 200 mg twice daily( home dose is 250 mg twice daily), Vimpat 100 mg twice daily  -Anticipate gradual improvement in left hemiparesis.  However it is too early to know how much function will recover - Continue seizure precautions - PRN IV ativan for clinical sz - Discussed plan with RN     I have spent a total of  27  minutes with the patient reviewing hospital notes,  test results, labs and examining the patient as well as establishing an assessment and plan. > 50% of time was spent in direct patient care.      Lindie Spruce Epilepsy [facility] For questions after 5pm please refer to AMION to reach the Neurologist on call

## 2023-12-06 NOTE — Progress Notes (Addendum)
 OT Cancellation Note  Patient Details Name: Meredith Park MRN: [id number] DOB: 08-18-1971   Cancelled Treatment:    Reason Eval/Treat Not Completed: Patient not medically ready-Pt sedated and intubated. Will plan to follow-up another day as able.   Barry Brunner, OT Acute Rehabilitation Services Office [phone] Secure Chat Preferred    Chancy Milroy 12/06/2023, 1:19 PM

## 2023-12-06 NOTE — Progress Notes (Signed)
 Nutrition Follow-up  DOCUMENTATION CODES:   Severe malnutrition in context of social or environmental circumstances  INTERVENTION:   Continue tube feeding via Cortrak: Osmolite 1.5 at 50 ml/h (1200 ml per day) Increase Prosource TF20 60 ml to BID   Provides 1960 kcal, 115 gm protein, 914 ml free water daily   Continue MVI, folic acid, Thiamine daily   200 ml free water every 4 hours Total free water: 2114 ml     NUTRITION DIAGNOSIS:   Severe Malnutrition related to social / environmental circumstances as evidenced by mild fat depletion, mild muscle depletion, moderate muscle depletion, severe muscle depletion, percent weight loss. Ongoing.   GOAL:   Patient will meet greater than or equal to 90% of their needs Met.   MONITOR:   TF tolerance, I & O's, Vent status, Labs  REASON FOR ASSESSMENT:   Ventilator    ASSESSMENT:   Pt with hx of alcohol abuse, drug abuse, HTN, and HLD presented to ED after being found down at home with seizure-like activity.  Pt discussed during ICU rounds and with RN and MD.  Pt re-intubated 4/2. Spoke with mom at bedside.    Per mom pt was sober for 5 years then relapsed last April. She has now been sober x 1 year.  Mom reports pt has lost 100 lb in the last year. She initially started losing weight per mom by decreasing portions but then lost her appetite and had taste changes making her not like any foods she normally liked.  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  Per chart review pt's weight was 232 lb 11/2022. Admission weight was 142 lb. 39% weight loss x 12 months.   3/25 - admitted; intubated 3/27 - increased pressor requirement worsening MOD 3/28 - s/p cortrak placement (gastric) 3/29 - improved pressor requirements 4/2 - extubated; re-intubated due to stridor and secretions   Medications reviewed and include: decadron x 1, colace, pepcid, folic acid, SSI, MVI with minerals, miralax, thiamine  Precedex Levophed @  2 mcg   Labs reviewed:  Na 146 Ammonia 67 (3/25) CBG's: 130-212 UOP: 3795 ml    NUTRITION - FOCUSED PHYSICAL EXAM:  Flowsheet Row Most Recent Value  Orbital Region Mild depletion  Upper Arm Region Mild depletion  Thoracic and Lumbar Region No depletion  Buccal Region Unable to assess  Temple Region Mild depletion  Clavicle Bone Region Mild depletion  Clavicle and Acromion Bone Region Moderate depletion  Scapular Bone Region Moderate depletion  Dorsal Hand Unable to assess  [R mitten, L edema]  Patellar Region Severe depletion  Anterior Thigh Region Severe depletion  Posterior Calf Region Moderate depletion  Edema (RD Assessment) Mild  [hand]  Hair Reviewed  Eyes Unable to assess  Mouth Unable to assess  Skin Reviewed  Nails Reviewed       Diet Order:   Diet Order             Diet NPO time specified  Diet effective now                   EDUCATION NEEDS:   Not appropriate for education at this time  Skin:  Skin Assessment: Reviewed RN Assessment  Last BM:  3/30 - 100 ml x 24 hours via rectal pocuh, type 7  Height:   Ht Readings from Last 1 Encounters:  12/05/23 5' 2.01" (1.575 m)    Weight:   Wt Readings from Last 1 Encounters:  12/06/23 74.5  kg    Ideal Body Weight:  50 kg  BMI:  Body mass index is 30.03 kg/m.  Estimated Nutritional Needs:   Kcal:  1800-2000 kcal/d  Protein:  100-120 grams  Fluid:  1.8-2L/d  Sharnee Douglass P., RD, LDN, CNSC See AMiON for contact information

## 2023-12-06 NOTE — Progress Notes (Signed)
 RT NOTE: patient placed on CPAP/PSV of 10/5 at 0805.  Tolerating well at this time.  Will continue to monitor and wean as tolerated.

## 2023-12-06 NOTE — Progress Notes (Signed)
 eLink Physician-Brief Progress Note Patient Name: Meredith Park DOB: [birth year] MRN: [id number]   Date of Service  12/06/2023  HPI/Events of Note  Asking for right wrist restraints  Camera: On vent.   eICU Interventions  Camera evaluation done for right soft wrist restraints request from bed side RN, to prevent self injury and harm while critically ill, altered mentation. .       Intervention Category Minor Interventions: Agitation / anxiety - evaluation and management  Ranee Gosselin 12/06/2023, 6:17 AM

## 2023-12-07 DIAGNOSIS — R4182 Altered mental status, unspecified: Secondary | ICD-10-CM

## 2023-12-07 DIAGNOSIS — J690 Pneumonitis due to inhalation of food and vomit: Secondary | ICD-10-CM | POA: Diagnosis not present

## 2023-12-07 DIAGNOSIS — G40901 Epilepsy, unspecified, not intractable, with status epilepticus: Secondary | ICD-10-CM | POA: Diagnosis not present

## 2023-12-07 DIAGNOSIS — G9340 Encephalopathy, unspecified: Secondary | ICD-10-CM | POA: Diagnosis not present

## 2023-12-07 DIAGNOSIS — E43 Unspecified severe protein-calorie malnutrition: Secondary | ICD-10-CM | POA: Insufficient documentation

## 2023-12-07 DIAGNOSIS — J9601 Acute respiratory failure with hypoxia: Secondary | ICD-10-CM | POA: Diagnosis not present

## 2023-12-07 LAB — BASIC METABOLIC PANEL WITH GFR
Anion gap: 13 (ref 5–15)
BUN: 67 mg/dL — ABNORMAL HIGH (ref 6–20)
CO2: 23 mmol/L (ref 22–32)
Calcium: 8.4 mg/dL — ABNORMAL LOW (ref 8.9–10.3)
Chloride: 109 mmol/L (ref 98–111)
Creatinine, Ser: 1.6 mg/dL — ABNORMAL HIGH (ref 0.44–1.00)
GFR, Estimated: 39 mL/min — ABNORMAL LOW (ref >60)
Glucose, Bld: 190 mg/dL — ABNORMAL HIGH (ref 70–99)
Potassium: 3.8 mmol/L (ref 3.5–5.1)
Sodium: 145 mmol/L (ref 135–145)

## 2023-12-07 LAB — CBC
HCT: 28.6 % — ABNORMAL LOW (ref 36.0–46.0)
Hemoglobin: 9.1 g/dL — ABNORMAL LOW (ref 12.0–15.0)
MCH: 27.5 pg (ref 26.0–34.0)
MCHC: 31.8 g/dL (ref 30.0–36.0)
MCV: 86.4 fL (ref 80.0–100.0)
Platelets: 317 10*3/uL (ref 150–400)
RBC: 3.31 MIL/uL — ABNORMAL LOW (ref 3.87–5.11)
RDW: 14.8 % (ref 11.5–15.5)
WBC: 14.2 10*3/uL — ABNORMAL HIGH (ref 4.0–10.5)
nRBC: 0 % (ref 0.0–0.2)

## 2023-12-07 LAB — GLUCOSE, CAPILLARY
Glucose-Capillary: 111 mg/dL — ABNORMAL HIGH (ref 70–99)
Glucose-Capillary: 130 mg/dL — ABNORMAL HIGH (ref 70–99)
Glucose-Capillary: 133 mg/dL — ABNORMAL HIGH (ref 70–99)
Glucose-Capillary: 144 mg/dL — ABNORMAL HIGH (ref 70–99)
Glucose-Capillary: 145 mg/dL — ABNORMAL HIGH (ref 70–99)
Glucose-Capillary: 153 mg/dL — ABNORMAL HIGH (ref 70–99)

## 2023-12-07 LAB — MAGNESIUM: Magnesium: 2.2 mg/dL (ref 1.7–2.4)

## 2023-12-07 MED ORDER — LAMOTRIGINE 25 MG PO TABS
250.0000 mg | ORAL_TABLET | Freq: Two times a day (BID) | ORAL | Status: DC
  Administered 2023-12-07 – 2024-01-02 (×50): 250 mg
  Filled 2023-12-07 (×5): qty 2
  Filled 2023-12-07: qty 1
  Filled 2023-12-07 (×26): qty 2
  Filled 2023-12-07: qty 1
  Filled 2023-12-07 (×10): qty 2
  Filled 2023-12-07: qty 1
  Filled 2023-12-07 (×8): qty 2
  Filled 2023-12-07: qty 1

## 2023-12-07 MED ORDER — SODIUM CHLORIDE 0.9 % IV SOLN
50.0000 mg | Freq: Two times a day (BID) | INTRAVENOUS | Status: DC
  Administered 2023-12-07 – 2023-12-11 (×9): 50 mg via INTRAVENOUS
  Filled 2023-12-07 (×11): qty 5

## 2023-12-07 MED ORDER — FUROSEMIDE 10 MG/ML IJ SOLN
60.0000 mg | Freq: Four times a day (QID) | INTRAMUSCULAR | Status: AC
  Administered 2023-12-07 (×2): 60 mg via INTRAVENOUS
  Filled 2023-12-07 (×2): qty 6

## 2023-12-07 NOTE — Progress Notes (Signed)
 NAME:  Meredith Park, MRN:  [id number], DOB:  06/06/1971, LOS: 10 ADMISSION DATE:  11/27/2023, CONSULTATION DATE: 3/25 REFERRING MD: Dr. Charm Barges EDP, CHIEF COMPLAINT: Seizure  History of Present Illness:  53 year old female with past medical history as below, which is significant for seizure disorder followed at [facility] neurology, alcohol abuse reportedly sober for 1 year, hypertension, marijuana use, and hyperlipidemia.  Seizures are managed with Lamictal and recently added topamax with better control of seizures.  Last seen by neurology 07/19/2023.  Family reports she did have a mixup with her seizure medications a week prior to arrival.  She ran out of one of her medications and did not realize it.  This has been remedied and she is back taking both medications.  The patient was in her usual state of health until 3/25 when she was found down beside her bed by her family at approximately 7 PM.  Last known well around 2:30 PM.  Her mother found her and noticed her leg shaking and she was unresponsive.  EMS was called upon EMS arrival she was noted to have continuous full body seizure as well as vomiting.  She was treated with 10 mg of IM Versed with cessation of seizures.  She remained minimally responsive in the emergency department.  She was hypoxic requiring 10 L of oxygen.  Chest x-ray was unremarkable.  CT of the head and C-spine were unremarkable for acute issues with the exception of findings associated with chronic paranasal sinusitis.  Due to poor responsiveness PCCM was asked to evaluate for ICU admission.  Pertinent  Medical History   has a past medical history of Alcohol abuse, Alcohol withdrawal (HCC) (08/28/2018), High cholesterol, Hypertension, and Seizures (HCC).   Significant Hospital Events: Including procedures, antibiotic start and stop dates in addition to other pertinent events   3/25 admit to ICU. Intubated 3/26 LP performed. Increasing pressor requirement 3/27 Increased  vasopressor requirement with worsening MOD including AKI, shock liver. Echo with stress CM. 3/29 Improved vasopressor requirements 4/2 central line d/c, extubated (had cuff leak), reintubated for stridor & secretions several hours later  Interim History / Subjective:  Was more awake and alert, following commands for neurology. Ended up on precedex for agitation. Mom at bedside. Febrile overnight.  Objective   Blood pressure (!) 125/97, pulse 99, temperature (!) 100.4 F (38 C), resp. rate (!) 22, height 5' 2.01" (1.575 m), weight 74.5 kg, last menstrual period 08/28/2020, SpO2 100%.    Vent Mode: PSV;CPAP FiO2 (%):  [40 %] 40 % Set Rate:  [20 bmp] 20 bmp Vt Set:  [400 mL] 400 mL PEEP:  [5 cmH20] 5 cmH20 Pressure Support:  [5 cmH20-8 cmH20] 8 cmH20 Plateau Pressure:  [13 cmH20-15 cmH20] 13 cmH20   Intake/Output Summary (Last 24 hours) at 12/07/2023 1150 Last data filed at 12/07/2023 0800 Gross per 24 hour  Intake 1945.13 ml  Output 4075 ml  Net -2129.87 ml   Filed Weights   12/04/23 0500 12/05/23 0500 12/06/23 0500  Weight: 74.8 kg 74.6 kg 74.5 kg   Physical Exam: Gen:      Intubated, sedated, acutely ill appearing HEENT:  ETT to vent Lungs:    sounds of mechanical ventilation auscultated no wheeze CV:         RRR Abd:      + bowel sounds; soft, non-tender; no palpable masses, no distension Ext:    No edema Skin:      Warm and dry; no rashes Neuro:   sedated,  RASS -2, left sided hemiparesis   Resp culture: few GPC, rare GNR, few yeast>normal flora 3/31 resp culture: abundant PMNs, rare GPC in pairs, rare GNR, rare yeast with pseudohyphae> normal flora  WBC 14.2 Hgb 9.1 Na145 K 3.8 Cr 1.6 down from 1.72  Resolved Hospital Problem list   Hypokalemia  Assessment & Plan:   Status epilepticus: Home lamictal, topamax however recently off due to mix-up. CTA neg for LVO. CT spin no acute fx. Acute encephalopathy 2/2 above: MR Brain with findings c/w prolonged status  epilepticus Discussed with neurology, plan is to taper klonazepam today as well as vimpat. Working way to home AED regiment of lamictal and topamax. -seizure precautions -treat fevers aggressively -ativan PRN for clinical seizures  Acute respiratory failure with hypoxia requiring mechanical ventilation Aspiration pneumonia, culture shows normal flora Air hunger Post-extubation stridor, delayed; reintubated with 7.5 ETT -LTVV -VAP prevention protocol -PAD protocol - continue steroids, abx, secretions improved today - continue diuresis -complete 7 days of escalated antibiotics (4/1-4/7) due to failure to respond to previous regimen, despite all cultures showing normal flora. Sputum volume finally decreasing and fevers now defervesced.  - not awake enough for extubation yet. Re-evaluate in afternoon. Passing SBT today.  Septic shock secondary to aspiration pneumonia. CNS infection ruled out.  -NE as required with sedation; goal MAP >65 - zosyn for 7 days  New onset cardiomyopathy with EF 40-45% and new LV WMA, suspect stress- induced Troponin elevation-- favor due to seizures, but no ischemia evaluation yet. Unlikely NSTEMI/ ACS. Echo newly depressed EF 40-45% with LV WMA, grade I DD, small pericardial effusion without tamponade History of hypertension -Appreciate Cardiology input. Not a candidate for cath due to critical illness. Likely stress induced CM -eventually needs repeat echo when more stable to determine if she has recovered or required GDMT - diuresis  AKI 2/2 rhabdo, ATN, CIN- improved UOP, improved Cr Metabolic acidosis hypernatremia -con't diuresis -maintain adequate perfusion -strict I/O -renally dose meds, avoid nephrotoxic meds -increase FWF  Rhabdo due to seizures -continues to improve -can recheck periodically  Transaminase elevation - improving, was likely related to rhabdo rather than GI pathology Elevated INR - normalized Abd [place] neg with doppler for  thrombosis, normal liver, mild ascites, small right pleural effusion. Acute hepatitis panel neg -completed 3 days of NAC -monitor periodically; GI has signed off  Thrombocytopenia - in setting of liver dysfunction, sepsis. Improved. -recheck tomorrow  Anemia of critical illness, dilution (almost 18L positive this admission) -diuresis -limiting lab draws as appropriate  History of ETOH abuse - reportedly sober for over a year -vitamins -recommend cessation  Hyperglycemia due to steroids -SSI PRN -glargine 8 units once since she is getting steroids today -goal BG 140-180  Best Practice (right click and "Reselect all SmartList Selections" daily)   Diet/type: tubefeeds DVT prophylaxis prophylactic heparin    Pressure ulcer(s): N/A GI prophylaxis: H2B Lines: N/A Foley:  N/A Code Status:  full code Last date of multidisciplinary goals of care discussion [ ]   Updated family including mother on 3/30 on telephone  Critical care time:     The patient is critically ill due to respiratory failure, encephalopathy, status epilepticus.  Critical care was necessary to treat or prevent imminent or life-threatening deterioration.  Critical care was time spent personally by me on the following activities: development of treatment plan with patient and/or surrogate as well as nursing, discussions with consultants, evaluation of patient's response to treatment, examination of patient, obtaining history from patient or surrogate,  ordering and performing treatments and interventions, ordering and review of laboratory studies, ordering and review of radiographic studies, pulse oximetry, re-evaluation of patient's condition and participation in multidisciplinary rounds.   Critical Care Time devoted to patient care services described in this note is 36 minutes. This time reflects time of care of this signee Charlott Holler . This critical care time does not reflect separately billable procedures or  procedure time, teaching time or supervisory time of PA/NP/Med student/Med Resident etc but could involve care discussion time.       Charlott Holler [facility] 12/07/2023 11:50 AM  Pager: see AMION  If no response to pager , please call critical care on call (see AMION) until 7pm After 7:00 pm call Elink

## 2023-12-07 NOTE — Progress Notes (Signed)
   Inpatient Rehab Admissions Coordinator :  Per therapy recommendations patient was screened for CIR candidacy by Ottie Glazier RN MSN. Patient is not yet at a level to tolerate the intensity required to pursue a CIR admit . Vent. The CIR admissions team will follow and monitor for progress and place a Rehab Consult order if felt to be appropriate. Please contact me with any questions.  Ottie Glazier RN MSN Admissions Coordinator [phone]

## 2023-12-07 NOTE — Evaluation (Signed)
 Occupational Therapy Evaluation Patient Details Name: Meredith Park MRN: [id number] DOB: 1971/07/05 Today's Date: 12/07/2023   History of Present Illness   53 yo female s/p seizure with acute encephalopathy acute respiratory failure, septic shock secondary to aspiration PNA PMh HTN, HLD ETOH abuse hx, seizure     Clinical Impressions PT admitted with seizure. Pt currently with functional limitiations due to the deficits listed below (see OT problem list). Pt was indep PTA. Pt demonstrates L hemiplegia and recommend L UE resting hand splint.  Pt will benefit from skilled OT to increase their independence and safety with adls and balance to allow discharge skilled inpatient follow up therapy, <3 hours/day. With vent.  Call me Angie   If plan is discharge home, recommend the following:   Two people to help with walking and/or transfers;Two people to help with bathing/dressing/bathroom     Functional Status Assessment   Patient has had a recent decline in their functional status and demonstrates the ability to make significant improvements in function in a reasonable and predictable amount of time.     Equipment Recommendations   Wheelchair cushion (measurements OT);Wheelchair (measurements OT);Hospital bed;Hoyer lift     Recommendations for Other Services   Speech consult;PT consult     Precautions/Restrictions   Precautions Precautions: Fall Precaution/Restrictions Comments: seizure, flexiseal, foley, cortrak, ETT vent Restrictions Weight Bearing Restrictions Per Provider Order: No     Mobility Bed Mobility Overal bed mobility: Needs Assistance Bed Mobility: Supine to Sit, Sit to Supine     Supine to sit: +2 for physical assistance, Max assist Sit to supine: +2 for physical assistance, Max assist   General bed mobility comments: requires (A) for lines / leads and progress to EOB. pt with eob max (A) to static sit    Transfers                    General transfer comment: unable to attempt this sesion due to coughing on vent      Balance Overall balance assessment: Needs assistance Sitting-balance support: Single extremity supported, Feet supported Sitting balance-Leahy Scale: Poor Sitting balance - Comments: pushing with R UE Postural control: Left lateral lean                                 ADL either performed or assessed with clinical judgement   ADL                                         General ADL Comments: requires (A) for all adls at this time.     Vision   Vision Assessment?: Yes Eye Alignment: Impaired (comment) Ocular Range of Motion: Impaired-to be further tested in functional context Tracking/Visual Pursuits: Impaired - to be further tested in functional context Additional Comments: noted to have dysconjugate gaze and increasd time to visually attend. pt with sitting L eye open more than R. pt holding R eye with eye lid droop     Perception Perception: Impaired Preception Impairment Details: Inattention/Neglect Perception-Other Comments: Left side   Praxis         Pertinent Vitals/Pain Pain Assessment Pain Assessment: No/denies pain     Extremity/Trunk Assessment Upper Extremity Assessment Upper Extremity Assessment: Right hand dominant;LUE deficits/detail LUE Deficits / Details: no activation noted but does have sensory displayed by R UE  response to nail bed pressure LUE Sensation: decreased light touch LUE Coordination: decreased fine motor;decreased gross motor   Lower Extremity Assessment Lower Extremity Assessment: Defer to PT evaluation   Cervical / Trunk Assessment Cervical / Trunk Assessment: Other exceptions (needs (A) to extend neck into neural position)   Communication Communication Communication: Impaired Factors Affecting Communication: Trach/intubated   Cognition Arousal: Alert Behavior During Therapy: Flat affect Cognition: Cognition  impaired, Difficult to assess Difficult to assess due to: Impaired communication, Intubated           OT - Cognition Comments: pt following 1 step commands, waves when greeted, visually turning head to R with name call                 Following commands: Intact (delayed but present)       Cueing  General Comments   Cueing Techniques: Verbal cues  VSS on vent CPAP 40% FIO2 Peep 5   Exercises     Shoulder Instructions      Home Living Family/patient expects to be discharged to:: Skilled nursing facility                                 Additional Comments: vent needs at this time      Prior Functioning/Environment Prior Level of Function : Independent/Modified Independent             Mobility Comments: no      OT Problem List: Decreased strength;Impaired balance (sitting and/or standing);Decreased activity tolerance;Decreased cognition;Decreased safety awareness;Decreased knowledge of use of DME or AE;Cardiopulmonary status limiting activity;Decreased knowledge of precautions;Impaired sensation;Impaired UE functional use;Decreased coordination;Impaired vision/perception   OT Treatment/Interventions: Self-care/ADL training;Therapeutic exercise;Neuromuscular education;Energy conservation;DME and/or AE instruction;Manual therapy;Modalities;Therapeutic activities;Cognitive remediation/compensation;Visual/perceptual remediation/compensation;Patient/family education;Balance training;Splinting      OT Goals(Current goals can be found in the care plan section)   Acute Rehab OT Goals Patient Stated Goal: none stated OT Goal Formulation: Patient unable to participate in goal setting Time For Goal Achievement: 12/21/23 Potential to Achieve Goals: Good   OT Frequency:  Min 2X/week    Co-evaluation PT/OT/SLP Co-Evaluation/Treatment: Yes Reason for Co-Treatment: Necessary to address cognition/behavior during functional activity;For patient/therapist  safety   OT goals addressed during session: ADL's and self-care;Proper use of Adaptive equipment and DME;Strengthening/ROM      AM-PAC OT "6 Clicks" Daily Activity     Outcome Measure Help from another person eating meals?: A Lot Help from another person taking care of personal grooming?: A Lot Help from another person toileting, which includes using toliet, bedpan, or urinal?: A Lot Help from another person bathing (including washing, rinsing, drying)?: A Lot Help from another person to put on and taking off regular upper body clothing?: A Lot Help from another person to put on and taking off regular lower body clothing?: A Lot 6 Click Score: 12   End of Session Equipment Utilized During Treatment: Oxygen Nurse Communication: Mobility status;Precautions  Activity Tolerance: Patient tolerated treatment well Patient left: in bed;with call bell/phone within reach;with bed alarm set;with family/visitor present  OT Visit Diagnosis: Unsteadiness on feet (R26.81);Muscle weakness (generalized) (M62.81)                Time: 1610-9604 OT Time Calculation (min): 24 min Charges:  OT General Charges $OT Visit: 1 Visit OT Evaluation $OT Eval Moderate Complexity: 1 Mod   Brynn, OTR/L  Acute Rehabilitation Services Office: [phone] .   Mateo Flow 12/07/2023, 4:31 PM

## 2023-12-07 NOTE — Progress Notes (Signed)
 PT Cancellation Note  Patient Details Name: Meredith Park MRN: [id number] DOB: [birth year]   Cancelled Treatment:    Reason Eval/Treat Not Completed: (P) Other (comment). RN reporting pt is weaning and may be extubated later today. Will plan to follow-up later as time permits.   Virgil Benedict, PT, DPT Acute Rehabilitation Services  Office: [phone]    Bettina Gavia 12/07/2023, 8:38 AM

## 2023-12-07 NOTE — Evaluation (Signed)
 Physical Therapy Evaluation Patient Details Name: Meredith Park MRN: [id number] DOB: Nov 23, 1970 Today's Date: 12/07/2023  History of Present Illness  53 yo female admitted 11/27/23 s/p seizure with acute encephalopathy, acute respiratory failure, septic shock secondary to aspiration PNA. LP 3/26, ETT 3/26-4/2, re-intubated 4/2. PMH: HTN, HLD ETOH abuse hx, seizure  Clinical Impression  Pt presents with condition above and deficits mentioned below, see PT Problem List. Pt is currently intubated and unable to provide PLOF/home info. Per chart review, it appears pt may have been living alone and may have been independent without DME at baseline. Currently, the pt is requiring maxAx2 for bed mobility and up to maxA for static sitting balance. She demonstrates deficits in arousal, attention, processing speed, and L-sided strength. She did have a delayed withdrawal to noxious/painful stimuli at the the L lower extremity, but otherwise did not demonstrate any active movement of the L lower extremity. She also demonstrated x > 5 beats of clonus at the L leg. As pt has had a drastic functional decline, she could benefit from intensive inpatient rehab, > 3 hours/day, if she can demonstrate improved activity tolerance as she recovers and can qualify. Will continue to follow acutely. Requested L lower extremity PRAFO.      If plan is discharge home, recommend the following: Two people to help with walking and/or transfers;Two people to help with bathing/dressing/bathroom;Assistance with cooking/housework;Direct supervision/assist for medications management;Assistance with feeding;Assist for transportation;Direct supervision/assist for financial management;Help with stairs or ramp for entrance   Can travel by private vehicle        Equipment Recommendations Other (comment) (TBA further)  Recommendations for Other Services  Rehab consult    Functional Status Assessment Patient has had a recent decline in  their functional status and demonstrates the ability to make significant improvements in function in a reasonable and predictable amount of time.     Precautions / Restrictions Precautions Precautions: Fall;Other (comment) Recall of Precautions/Restrictions: Impaired Precaution/Restrictions Comments: seizure, flexiseal, foley, cortrak, ETT vent, R wrist restraint Restrictions Weight Bearing Restrictions Per Provider Order: No      Mobility  Bed Mobility Overal bed mobility: Needs Assistance Bed Mobility: Supine to Sit, Sit to Supine     Supine to sit: +2 for physical assistance, Max assist Sit to supine: +2 for physical assistance, Max assist   General bed mobility comments: MaxAx2 needed to manage legs and trunk with supine <> sit L EOB, cuing pt to kick R leg off L EOB and grab and pull on therapist's hand with her R UE to sit up, good command following with this noted.    Transfers                   General transfer comment: unable to attempt this session due to coughing on vent    Ambulation/Gait               General Gait Details: deferred  Stairs            Wheelchair Mobility     Tilt Bed    Modified Rankin (Stroke Patients Only) Modified Rankin (Stroke Patients Only) Pre-Morbid Rankin Score: No symptoms Modified Rankin: Severe disability     Balance Overall balance assessment: Needs assistance Sitting-balance support: Single extremity supported, Feet supported Sitting balance-Leahy Scale: Poor Sitting balance - Comments: pushing with R UE to her L, needing mod-maxA for static sitting balance and assistance to lift up her head. Propped pt on her R elbow to try to  reduce pushing, min success noted. Postural control: Left lateral lean     Standing balance comment: deferred                             Pertinent Vitals/Pain Pain Assessment Pain Assessment: Faces Faces Pain Scale: Hurts little more Pain Location: grimacing  with gagging/coughing on vent Pain Descriptors / Indicators: Grimacing Pain Intervention(s): Limited activity within patient's tolerance, Monitored during session, Repositioned    Home Living Family/patient expects to be discharged to:: Unsure                   Additional Comments: per chart, pt may have been living alone    Prior Function Prior Level of Function : Independent/Modified Independent             Mobility Comments: per chart review from 2024, she was not utilizing an AD       Extremity/Trunk Assessment   Upper Extremity Assessment Upper Extremity Assessment: Right hand dominant;Defer to OT evaluation LUE Deficits / Details: no activation noted but does have sensory displayed by R UE response to nail bed pressure LUE Sensation: decreased light touch LUE Coordination: decreased fine motor;decreased gross motor    Lower Extremity Assessment Lower Extremity Assessment: LLE deficits/detail LLE Deficits / Details: x > 5 beat clonus noted; delayed and weak withdrawal to painful/noxious stimuli noted; tightness noted in gastrocs; no active muscle activation noted except when withdrawing to pain/noxious stimuli LLE Sensation: decreased light touch    Cervical / Trunk Assessment Cervical / Trunk Assessment: Other exceptions (needs (A) to extend neck into neural position, rests flexing laterally and rotating to L towards vent)  Communication   Communication Communication: Impaired Factors Affecting Communication: Trach/intubated    Cognition Arousal: Lethargic Behavior During Therapy: Flat affect   PT - Cognitive impairments: Difficult to assess Difficult to assess due to: Intubated                     PT - Cognition Comments: Pt kept eyes closed majority of the time but was able to look at therapist when cued on occasion. Followed simple cues with her R extremities fairly consistently, but needed extra time to process and follow Following  commands: Intact (delayed but present)       Cueing Cueing Techniques: Verbal cues, Tactile cues     General Comments General comments (skin integrity, edema, etc.): VSS on vent CPAP 40% FIO2 Peep 5    Exercises     Assessment/Plan    PT Assessment Patient needs continued PT services  PT Problem List Decreased strength;Decreased activity tolerance;Decreased balance;Decreased coordination;Decreased mobility;Cardiopulmonary status limiting activity;Impaired sensation;Decreased cognition;Impaired tone       PT Treatment Interventions Gait training;DME instruction;Therapeutic exercise;Therapeutic activities;Functional mobility training;Balance training;Neuromuscular re-education;Patient/family education;Cognitive remediation;Stair training    PT Goals (Current goals can be found in the Care Plan section)  Acute Rehab PT Goals Patient Stated Goal: unable to state PT Goal Formulation: With patient/family Time For Goal Achievement: 12/21/23 Potential to Achieve Goals: Good    Frequency Min 2X/week     Co-evaluation PT/OT/SLP Co-Evaluation/Treatment: Yes Reason for Co-Treatment: Necessary to address cognition/behavior during functional activity;For patient/therapist safety;Complexity of the patient's impairments (multi-system involvement);To address functional/ADL transfers PT goals addressed during session: Mobility/safety with mobility;Balance OT goals addressed during session: ADL's and self-care;Proper use of Adaptive equipment and DME;Strengthening/ROM       AM-PAC PT "6 Clicks" Mobility  Outcome Measure Help  needed turning from your back to your side while in a flat bed without using bedrails?: Total Help needed moving from lying on your back to sitting on the side of a flat bed without using bedrails?: Total Help needed moving to and from a bed to a chair (including a wheelchair)?: Total Help needed standing up from a chair using your arms (e.g., wheelchair or bedside  chair)?: Total Help needed to walk in hospital room?: Total Help needed climbing 3-5 steps with a railing? : Total 6 Click Score: 6    End of Session Equipment Utilized During Treatment: Oxygen Activity Tolerance: Patient tolerated treatment well Patient left: in bed;with call bell/phone within reach;with bed alarm set;with restraints reapplied;Other (comment) (with RT present) Nurse Communication: Mobility status;Other (comment) (requested L PRAFO from RN and MD) PT Visit Diagnosis: Muscle weakness (generalized) (M62.81);Difficulty in walking, not elsewhere classified (R26.2);Other symptoms and signs involving the nervous system (R29.898)    Time: 4098-1191 PT Time Calculation (min) (ACUTE ONLY): 25 min   Charges:   PT Evaluation $PT Eval Moderate Complexity: 1 Mod   PT General Charges $$ ACUTE PT VISIT: 1 Visit         Virgil Benedict, PT, DPT Acute Rehabilitation Services  Office: [phone]   Bettina Gavia 12/07/2023, 5:09 PM

## 2023-12-07 NOTE — Progress Notes (Signed)
 OT NOTE  RN STAFF  Please check splint every 4 hours during shift ( remove splint ) to assess for: * pain * redness *swelling  If any symptoms above present remove splint for 15 minutes. If symptoms continue - keep the splint removed and notify OT staff [phone] immediately.   Keep the UE elevated at all times on pillows / towels.  Splint should have two splint covers (blue cloth) with a mesh bag for cleaning. The splint cover (blue cloth) should be washed with soapy water and hung out to dry or washed on delicate in home washer. Do not throw away splint cover it is washable. Please have a set location to store the splints in patients room for daily application.    Splints are to be worn for 4 hours and off for 2 hours  Examples of schedule:  Splints off at 6am Splints on at 8 am Splints off at 12 pm Splints on at 2 pm Splints off at 6pm Splints on at 8 pm    To place the splint on:  1. Place the wrist in position first and secure strap 2. Position each digit and apply strap 3. The thumb and forearm strap should be applied last   The splints should fit as appeared here Strap over the PIP joint of finger Strap over the MCP ( knuckles)  joints of the hand Strap at the thumb Strap at the wrist Strap at the forearm   Timmothy Euler, OTR/L  Acute Rehabilitation Services Office: [phone] .

## 2023-12-07 NOTE — Progress Notes (Signed)
 Orthopedic Tech Progress Note Patient Details:  Meredith Park 1971-08-15 [id number]  Patient ID: Meredith Park, female   DOB: December 12, 1970, 53 y.o.   MRN: [id number] Ordered Resting hand splints from [facility]. Tonye Pearson 12/07/2023, 4:58 PM

## 2023-12-07 NOTE — Progress Notes (Signed)
 Subjective: Tmax 101.31F.   ROS: Unable to obtain due to poor mental status  Examination  Vital signs in last 24 hours: Temp:  [98.3 F (36.8 C)-101.2 F (38.4 C)] 100.7 F (38.2 C) (04/04 0800) Pulse Rate:  [60-104] 91 (04/04 0800) Resp:  [13-29] 23 (04/04 0800) BP: (79-159)/(53-99) 128/77 (04/04 0800) SpO2:  [100 %] 100 % (04/04 0800) FiO2 (%):  [40 %] 40 % (04/04 0727)  General: lying in bed, intubated on pressor support Neuro: Opens eyes to repeated tactile stimulation, does not follow commands like raising her arm and wiggling her toes, squeezing my hand, however prefers to keep eyes closed and does not track examiner in room, antigravity strength in right upper and right lower extremity, withdraws to noxious stimuli in left upper and left lower extremity today  Basic Metabolic Panel: Recent Labs  Lab 12/03/23 0601 12/04/23 0522 12/05/23 0945 12/05/23 2104 12/06/23 0543 12/07/23 0612  NA 143 143 144 144 146* 145  K 4.1 3.9 3.9 4.4 4.3 3.8  CL 119* 111 109  --  107 109  CO2 18* 21* 21*  --  22 23  GLUCOSE 140* 168* 139*  --  188* 190*  BUN 44* 52* 60*  --  64* 67*  CREATININE 1.73* 1.75* 1.71*  --  1.72* 1.60*  CALCIUM 7.5* 8.3* 8.1*  --  8.5* 8.4*  MG  --   --   --   --   --  2.2    CBC: Recent Labs  Lab 12/01/23 0449 12/02/23 0459 12/03/23 0233 12/03/23 0601 12/04/23 0522 12/05/23 0945 12/05/23 2104  WBC 7.0 7.6  --  6.5 8.1 7.3  --   HGB 10.5* 10.2* 8.8* 9.6* 10.0* 9.4* 8.8*  HCT 31.0* 30.9* 26.0* 30.2* 31.3* 30.2* 26.0*  MCV 81.8 83.1  --  86.3 86.2 87.3  --   PLT 33* 42*  --  70* 120* 124*  --      Coagulation Studies: No results for input(s): "LABPROT", "INR" in the last 72 hours.  Imaging No new brain imaging overnight   ASSESSMENT AND PLAN: 53 y.o. female with hx of alcohol and substance use, Htn, seizures on lamotrigine and topamax who was found seizing for unknown duration.  She was given Versed in the field with resolution of clinical  seizure activity noted.  Obtunded mentation for several hours after arrival with no significant improvement.  CT head without contrast with no acute intracranial abnormality.  CT angio of the head and neck with no basilar thrombosis or LVO. EEG showed LPDS with likely ictal nature.   Status epilepticus convulsive, resolved Acute encephalopathy due to seizure Shock liver -improving Rhabdomyolysis - improving AKI -stable Cardiomyopathy with reduced EF -- likely secondary to stress Acute respiratory failure with hypoxia  Thrombocytopenia-resolved Coagulopathy -resolved Fever -Mental status gradually improving.  Consistently following commands now.  Left hemiparesis also gradually improving with withdrawal in left upper and left lower extremity today   Recommendations -Last dose of clonazepam today -Continue topamax to 100mg  daily -Increase lamotrigine to 250 mg twice daily (home dose) -Reduce Vimpat to 50 mg twice daily with plan to eventually taper off to minimize polypharmacy -Anticipate gradual improvement in left hemiparesis.  However it is too early to know how much function will recover -Management of fever per primary team - Continue seizure precautions - PRN IV ativan for clinical sz - Discussed plan with RN, pharmacist and Dr Celine Mans    I have spent a total of  36  minutes with the patient reviewing hospital notes,  test results, labs and examining the patient as well as establishing an assessment and plan. > 50% of time was spent in direct patient care.    Lindie Spruce Epilepsy [facility] For questions after 5pm please refer to AMION to reach the Neurologist on call

## 2023-12-07 NOTE — TOC CM/SW Note (Signed)
 Transition of Care [facility]) - Inpatient Brief Assessment   Patient Details  Name: Meredith Park MRN: [id number] Date of Birth: 1970-11-06  [facility] of [facility]) CM/SW Contact:    Mearl Latin, LCSW Phone Number: 12/07/2023, 9:18 AM   Clinical Narrative: TOC continuing to follow as patient remains intubated.    Transition of Care Asessment: Insurance and Status: Insurance coverage has been reviewed Patient has primary care physician: Yes [facility]) Home environment has been reviewed: Lives with family Prior level of function:: Independent Prior/Current Home Services: No current home services Social Drivers of Health Review: SDOH reviewed no interventions necessary Readmission risk has been reviewed: Yes Transition of care needs: transition of care needs identified, TOC will continue to follow

## 2023-12-08 ENCOUNTER — Inpatient Hospital Stay (HOSPITAL_COMMUNITY)

## 2023-12-08 DIAGNOSIS — J9601 Acute respiratory failure with hypoxia: Secondary | ICD-10-CM | POA: Diagnosis not present

## 2023-12-08 DIAGNOSIS — G936 Cerebral edema: Secondary | ICD-10-CM | POA: Diagnosis not present

## 2023-12-08 DIAGNOSIS — R569 Unspecified convulsions: Secondary | ICD-10-CM | POA: Diagnosis not present

## 2023-12-08 DIAGNOSIS — G40901 Epilepsy, unspecified, not intractable, with status epilepticus: Secondary | ICD-10-CM | POA: Diagnosis not present

## 2023-12-08 DIAGNOSIS — G9340 Encephalopathy, unspecified: Secondary | ICD-10-CM | POA: Diagnosis not present

## 2023-12-08 DIAGNOSIS — J690 Pneumonitis due to inhalation of food and vomit: Secondary | ICD-10-CM | POA: Diagnosis not present

## 2023-12-08 LAB — BASIC METABOLIC PANEL WITH GFR
Anion gap: 14 (ref 5–15)
BUN: 60 mg/dL — ABNORMAL HIGH (ref 6–20)
CO2: 24 mmol/L (ref 22–32)
Calcium: 8.7 mg/dL — ABNORMAL LOW (ref 8.9–10.3)
Chloride: 105 mmol/L (ref 98–111)
Creatinine, Ser: 1.55 mg/dL — ABNORMAL HIGH (ref 0.44–1.00)
GFR, Estimated: 40 mL/min — ABNORMAL LOW (ref >60)
Glucose, Bld: 163 mg/dL — ABNORMAL HIGH (ref 70–99)
Potassium: 3.3 mmol/L — ABNORMAL LOW (ref 3.5–5.1)
Sodium: 143 mmol/L (ref 135–145)

## 2023-12-08 LAB — CBC
HCT: 31.8 % — ABNORMAL LOW (ref 36.0–46.0)
Hemoglobin: 10.2 g/dL — ABNORMAL LOW (ref 12.0–15.0)
MCH: 27.5 pg (ref 26.0–34.0)
MCHC: 32.1 g/dL (ref 30.0–36.0)
MCV: 85.7 fL (ref 80.0–100.0)
Platelets: 337 10*3/uL (ref 150–400)
RBC: 3.71 MIL/uL — ABNORMAL LOW (ref 3.87–5.11)
RDW: 14.5 % (ref 11.5–15.5)
WBC: 11 10*3/uL — ABNORMAL HIGH (ref 4.0–10.5)
nRBC: 0 % (ref 0.0–0.2)

## 2023-12-08 LAB — GLUCOSE, CAPILLARY
Glucose-Capillary: 147 mg/dL — ABNORMAL HIGH (ref 70–99)
Glucose-Capillary: 148 mg/dL — ABNORMAL HIGH (ref 70–99)
Glucose-Capillary: 159 mg/dL — ABNORMAL HIGH (ref 70–99)
Glucose-Capillary: 162 mg/dL — ABNORMAL HIGH (ref 70–99)
Glucose-Capillary: 170 mg/dL — ABNORMAL HIGH (ref 70–99)
Glucose-Capillary: 186 mg/dL — ABNORMAL HIGH (ref 70–99)

## 2023-12-08 LAB — HEPATIC FUNCTION PANEL
ALT: 166 U/L — ABNORMAL HIGH (ref 0–44)
AST: 136 U/L — ABNORMAL HIGH (ref 15–41)
Albumin: 3.1 g/dL — ABNORMAL LOW (ref 3.5–5.0)
Alkaline Phosphatase: 55 U/L (ref 38–126)
Bilirubin, Direct: 0.2 mg/dL (ref 0.0–0.2)
Indirect Bilirubin: 0.5 mg/dL (ref 0.3–0.9)
Total Bilirubin: 0.7 mg/dL (ref 0.0–1.2)
Total Protein: 6.1 g/dL — ABNORMAL LOW (ref 6.5–8.1)

## 2023-12-08 LAB — SODIUM: Sodium: 145 mmol/L (ref 135–145)

## 2023-12-08 LAB — AMMONIA: Ammonia: 26 umol/L (ref 9–35)

## 2023-12-08 MED ORDER — SODIUM CHLORIDE 3 % IV SOLN
INTRAVENOUS | Status: DC
  Filled 2023-12-08 (×2): qty 500

## 2023-12-08 MED ORDER — STROKE: EARLY STAGES OF RECOVERY BOOK
Freq: Once | Status: AC
  Filled 2023-12-08: qty 1

## 2023-12-08 MED ORDER — FUROSEMIDE 10 MG/ML IJ SOLN
40.0000 mg | Freq: Four times a day (QID) | INTRAMUSCULAR | Status: AC
Start: 2023-12-08 — End: 2023-12-08
  Administered 2023-12-08 (×2): 40 mg via INTRAVENOUS
  Filled 2023-12-08 (×2): qty 4

## 2023-12-08 MED ORDER — IOHEXOL 350 MG/ML SOLN
60.0000 mL | Freq: Once | INTRAVENOUS | Status: AC | PRN
  Administered 2023-12-08: 60 mL via INTRAVENOUS

## 2023-12-08 NOTE — Progress Notes (Signed)
 Patient transported to MRI and CT without any complications.

## 2023-12-08 NOTE — Plan of Care (Signed)
  Problem: Clinical Measurements: Goal: Diagnostic test results will improve Outcome: Progressing   Problem: Activity: Goal: Risk for activity intolerance will decrease Outcome: Progressing   Problem: Nutrition: Goal: Adequate nutrition will be maintained Outcome: Progressing   Problem: Coping: Goal: Level of anxiety will decrease Outcome: Progressing   Problem: Safety: Goal: Ability to remain free from injury will improve Outcome: Progressing   Problem: Skin Integrity: Goal: Risk for impaired skin integrity will decrease Outcome: Progressing

## 2023-12-08 NOTE — Progress Notes (Signed)
 Re-evaluated this afternoon for extubation. Awake but not tracking, following commands consistently and still with unexplained left sided hemiplegia which has been present for the last few days. Will obtain ct head to evaluate as she hasn't had one since admission. Will hold on extubation for now.   Additional cc time 15 minutes.  Durel Salts, MD Pulmonary and Critical Care Medicine [facility] 12/08/2023 4:48 PM Pager: see AMION  If no response to pager, please call critical care on call (see AMION) until 7pm After 7:00 pm call Elink

## 2023-12-08 NOTE — Progress Notes (Signed)
 Patient transported from 4N27 to CT and back to 4N27 with no complications noted.

## 2023-12-08 NOTE — Progress Notes (Addendum)
 NEUROLOGY CONSULT FOLLOW UP NOTE   Date of service: December 08, 2023 Patient Name: Meredith Park MRN:  [id number] DOB:  Jul 12, 1971  Interval Hx/subjective  CTH completed. Large right hemispheric infarct.  Vitals   Vitals:   12/08/23 1500 12/08/23 1527 12/08/23 1600 12/08/23 1800  BP: (!) 153/98  (!) 138/95 (!) 138/97  Pulse: (!) 121  (!) 108 (!) 109  Resp: (!) 22 (!) 24 (!) 23 (!) 25  Temp:   99.5 F (37.5 C)   TempSrc:   Axillary   SpO2: 100%  100% 100%  Weight:      Height:         Body mass index is 26.24 kg/m.  Physical Exam  Gen: intubated sedated, in no distress HEENT [place] AT [facility] Resp: intubated NEUROLOGICAL EXAM Drowsy but opens eyes to nox stim, alert once stimulated, right gaze preference, does  not follow commands, intubated CN: PERRL, EOM exam shows right gaze preference, no blink from left, facial symmetry diffcult to ascertain due to tube.  Motor: spontaneous right UE and LE movement. Flaccid LUE and LLE. Sensory: no withdrawal on left, strong withdrawal on right. Coord: can not assess   Medications  Current Facility-Administered Medications:    acetaminophen (TYLENOL) tablet 650 mg, 650 mg, Per Tube, Q4H PRN, Steffanie Dunn, DO, 650 mg at 12/07/23 1136   Chlorhexidine Gluconate Cloth 2 % PADS 6 each, 6 each, Topical, Daily, Duayne Cal, NP, 6 each at 12/08/23 1200   dexmedetomidine (PRECEDEX) 400 MCG/100ML (4 mcg/mL) infusion, 0-1.2 mcg/kg/hr, Intravenous, Titrated, Cherlynn Polo, Lauren E, PA-C, Last Rate: 20.5 mL/hr at 12/08/23 1821, 1.1 mcg/kg/hr at 12/08/23 1821   docusate (COLACE) 50 MG/5ML liquid 100 mg, 100 mg, Per Tube, BID, Duayne Cal, NP, 100 mg at 12/07/23 2110   docusate sodium (COLACE) capsule 100 mg, 100 mg, Oral, BID PRN, Reita May, Rutwij, MD   famotidine (PEPCID) tablet 20 mg, 20 mg, Per Tube, Daily, Calton Dach I, RPH, 20 mg at 12/08/23 7829   feeding supplement (OSMOLITE 1.5 CAL) liquid 1,000 mL, 1,000 mL, Per Tube, Continuous,  Luciano Cutter, MD, Last Rate: 50 mL/hr at 12/08/23 1800, Infusion Verify at 12/08/23 1800   feeding supplement (PROSource TF20) liquid 60 mL, 60 mL, Per Tube, BID, Steffanie Dunn, DO, 60 mL at 12/08/23 5621   fentaNYL (SUBLIMAZE) injection 50-200 mcg, 50-200 mcg, Intravenous, Q30 min PRN, Cristopher Peru, PA-C, 50 mcg at 12/08/23 1657   folic acid (FOLVITE) tablet 1 mg, 1 mg, Per Tube, Daily, Luciano Cutter, MD, 1 mg at 12/08/23 3086   free water 200 mL, 200 mL, Per Tube, Q4H, Karie Fetch P, DO, 200 mL at 12/08/23 1506   heparin injection 5,000 Units, 5,000 Units, Subcutaneous, Q8H, Steffanie Dunn, DO, 5,000 Units at 12/08/23 1415   hydrocortisone cream 1 %, , Topical, PRN, Karie Fetch P, DO   insulin aspart (novoLOG) injection 0-9 Units, 0-9 Units, Subcutaneous, Q4H, Luciano Cutter, MD, 1 Units at 12/08/23 1604   ipratropium-albuterol (DUONEB) 0.5-2.5 (3) MG/3ML nebulizer solution 3 mL, 3 mL, Nebulization, Q6H PRN, Reita May, Rutwij, MD, 3 mL at 12/05/23 1808   lacosamide (VIMPAT) 50 mg in sodium chloride 0.9 % 25 mL IVPB, 50 mg, Intravenous, Q12H, Charlsie Quest, MD, Stopped at 12/08/23 1038   lamoTRIgine (LAMICTAL) tablet 250 mg, 250 mg, Per Tube, BID, Charlsie Quest, MD, 250 mg at 12/08/23 0921   levalbuterol (XOPENEX) nebulizer solution 0.63 mg, 0.63 mg, Nebulization, Q6H  PRN, Cristopher Peru, PA-C   LORazepam (ATIVAN) injection 2 mg, 2 mg, Intravenous, PRN, Charlsie Quest, MD, 2 mg at 12/07/23 0422   multivitamin with minerals tablet 1 tablet, 1 tablet, Per Tube, Daily, Calton Dach I, RPH, 1 tablet at 12/08/23 1610   norepinephrine (LEVOPHED) 4mg  in (0.016 mg/mL) premix infusion, 2-10 mcg/min, Intravenous, Titrated, Quenton Fetter, RPH, Stopped at 12/08/23 0844   Oral care mouth rinse, 15 mL, Mouth Rinse, PRN, Duayne Cal, NP   Oral care mouth rinse, 15 mL, Mouth Rinse, Q2H, Autry, Lauren E, PA-C, 15 mL at 12/08/23 1821   oxyCODONE (Oxy IR/ROXICODONE)  immediate release tablet 5 mg, 5 mg, Per Tube, Q6H, Steffanie Dunn, DO, 5 mg at 12/08/23 1820   piperacillin-tazobactam (ZOSYN) IVPB 3.375 g, 3.375 g, Intravenous, Q8H, Daylene Posey, RPH, Last Rate: 12.5 mL/hr at 12/08/23 1800, Infusion Verify at 12/08/23 1800   polyethylene glycol (MIRALAX / GLYCOLAX) packet 17 g, 17 g, Oral, Daily PRN, Reita May, Rutwij, MD   polyethylene glycol (MIRALAX / GLYCOLAX) packet 17 g, 17 g, Per Tube, Daily, Duayne Cal, NP, 17 g at 12/06/23 9604   sodium chloride (hypertonic) 3 % solution, , Intravenous, Continuous, Erick Blinks, MD   [EXPIRED] thiamine (VITAMIN B1) 500 mg in sodium chloride 0.9 % 50 mL IVPB, 500 mg, Intravenous, Q8H, Stopped at 11/29/23 2314 **FOLLOWED BY** [COMPLETED] thiamine (VITAMIN B1) 250 mg in sodium chloride 0.9 % 50 mL IVPB, 250 mg, Intravenous, Daily, Stopped at 12/05/23 0937 **FOLLOWED BY** thiamine (VITAMIN B1) injection 100 mg, 100 mg, Intravenous, Daily, Erick Blinks, MD, 100 mg at 12/08/23 5409   topiramate (TOPAMAX) tablet 100 mg, 100 mg, Per Tube, QHS, Charlsie Quest, MD, 100 mg at 12/07/23 2108  Labs and Diagnostic Imaging   CBC:  Recent Labs  Lab 12/07/23 0829 12/08/23 0931  WBC 14.2* 11.0*  HGB 9.1* 10.2*  HCT 28.6* 31.8*  MCV 86.4 85.7  PLT 317 337    Basic Metabolic Panel:  Lab Results  Component Value Date   NA 143 12/08/2023   K 3.3 (L) 12/08/2023   CO2 24 12/08/2023   GLUCOSE 163 (H) 12/08/2023   BUN 60 (H) 12/08/2023   CREATININE 1.55 (H) 12/08/2023   CALCIUM 8.7 (L) 12/08/2023   GFRNONAA 40 (L) 12/08/2023   GFRAA >60 11/10/2018   HgbA1c:  Lab Results  Component Value Date   HGBA1C 5.0 11/29/2023   Urine Drug Screen:     Component Value Date/Time   LABOPIA NONE DETECTED 11/27/2023 2207   COCAINSCRNUR NONE DETECTED 11/27/2023 2207   LABBENZ POSITIVE (A) 11/27/2023 2207   AMPHETMU NONE DETECTED 11/27/2023 2207   THCU POSITIVE (A) 11/27/2023 2207   LABBARB NONE DETECTED 11/27/2023  2207    Alcohol Level     Component Value Date/Time   ETH <10 11/27/2023 2058   INR  Lab Results  Component Value Date   INR 1.1 12/03/2023   APTT No results found for: "APTT" AED levels:  Lab Results  Component Value Date   LAMOTRIGINE 5.5 11/27/2023    CT Head without contrast(Personally reviewed): IMPRESSION: 1. Expected evolution of a large right hemispheric infarct. 2. Diffuse edema is present with effacement of the sulci and partial effacement the right lateral ventricle. 1-2 mm midline shift is present. 3. 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 5. No acute hemorrhage.  CT angio Head and Neck with contrast(Personally reviewed): pending  MRI Brain(Personally reviewed): 11/30/23 -  IMPRESSION: 1.  Extensive cortical restricted diffusion in the right cerebral hemisphere and medial right thalamus with contralateral involvement of the cerebellum, pattern compatible with seizure phenomenon in this patient in recent status. 2. Small foci of restricted diffusion in the left temporal white matter more suggestive of white matter infarcts. There is chronic white matter disease.  Repeat MR brain today ordered and pending.  Echocardiogram 11/28/2023 with LVEF of 40 to 45%, wall motion abnormalities seen.  Grade 1 diastolic dysfunction, small pericardial effusion, LA normal in size, no shunt by color-flow Doppler   Assessment   Poetry Cerro is a 53 y.o. female past medical history of alcohol and substance abuse, hypertension, seizures was found on seizing for unknown duration.  Brought in after Versed in the field with resolution of clinical seizure activity.  Obtunded mentation after significant passage of time prompted further workup.  MRI brain with cortical changes in the right hemisphere thought to be concerning more of a postictal phenomenology.  EEG with LPD's likely ictal in nature.  CT angiogram was done-no emergent occlusion. Exam remains poor  despite resolution of ictal activity on the EEG.  Repeat head CT done that shows extensive right hemispheric edema more than expected from postictal from neurology. Likely a large right hemispheric stroke. At this time, she is outside of any window for treatment from a stroke perspective. Will need full stroke workup.   Recommendations  For the stroke: Etiology uncertain at this time MRI brain without contrast CT angiogram without contrast CT venogram to rule out venous sinus thrombus as the etiology of the stroke. Echo completed-no need to repeat A1c 5-at goal Check lipid panel Will hold off on antiplatelets or anticoagulants pending further imaging studies  For the seizures: Continue antiepileptics As needed Ativan for clinical seizures Therapy assessments when able to Vent management per PCCM. Stroke team to follow in the morning.  ______________________________________________________________________   Signed, Milon Dikes, MD [facility]   CRITICAL CARE ATTESTATION Performed by: Milon Dikes, MD Total critical care time: 35 minutes Critical care time was exclusive of separately billable procedures and treating other patients and/or supervising APPs/Residents/Students Critical care was necessary to treat or prevent imminent or life-threatening deterioration. This patient is critically ill and at significant risk for neurological worsening and/or death and care requires constant monitoring. Critical care was time spent personally by me on the following activities: development of treatment plan with patient and/or surrogate as well as nursing, discussions with consultants, evaluation of patient's response to treatment, examination of patient, obtaining history from patient or surrogate, ordering and performing treatments and interventions, ordering and review of laboratory studies, ordering and review of radiographic studies, pulse oximetry, re-evaluation of  patient's condition, participation in multidisciplinary rounds and medical decision making of high complexity in the care of this patient.

## 2023-12-08 NOTE — Progress Notes (Signed)
 NAME:  Natilee Gauer, MRN:  [id number], DOB:  1971-06-07, LOS: 11 ADMISSION DATE:  11/27/2023, CONSULTATION DATE: 3/25 REFERRING MD: Dr. Charm Barges EDP, CHIEF COMPLAINT: Seizure  History of Present Illness:  53 year old female with past medical history as below, which is significant for seizure disorder followed at [facility] neurology, alcohol abuse reportedly sober for 1 year, hypertension, marijuana use, and hyperlipidemia.  Seizures are managed with Lamictal and recently added topamax with better control of seizures.  Last seen by neurology 07/19/2023.  Family reports she did have a mixup with her seizure medications a week prior to arrival.  She ran out of one of her medications and did not realize it.  This has been remedied and she is back taking both medications.  The patient was in her usual state of health until 3/25 when she was found down beside her bed by her family at approximately 7 PM.  Last known well around 2:30 PM.  Her mother found her and noticed her leg shaking and she was unresponsive.  EMS was called upon EMS arrival she was noted to have continuous full body seizure as well as vomiting.  She was treated with 10 mg of IM Versed with cessation of seizures.  She remained minimally responsive in the emergency department.  She was hypoxic requiring 10 L of oxygen.  Chest x-ray was unremarkable.  CT of the head and C-spine were unremarkable for acute issues with the exception of findings associated with chronic paranasal sinusitis.  Due to poor responsiveness PCCM was asked to evaluate for ICU admission.  Pertinent  Medical History   has a past medical history of Alcohol abuse, Alcohol withdrawal (HCC) (08/28/2018), High cholesterol, Hypertension, and Seizures (HCC).   Significant Hospital Events: Including procedures, antibiotic start and stop dates in addition to other pertinent events   3/25 admit to ICU. Intubated 3/26 LP performed. Increasing pressor requirement 3/27 Increased  vasopressor requirement with worsening MOD including AKI, shock liver. Echo with stress CM. 3/29 Improved vasopressor requirements 4/2 central line d/c, extubated (had cuff leak), reintubated for stridor & secretions several hours later  Interim History / Subjective:  Received fentanyl bolus overnight and dropped her blood pressure and made her more sedated. Still on precedex at max dose. Responded well to diuresis. Afebrile. On low dose levo overnight. Seems to be in pain.   Objective   Blood pressure (!) 146/71, pulse (!) 102, temperature 99.3 F (37.4 C), temperature source Oral, resp. rate 20, height 5' 2.01" (1.575 m), weight 65.1 kg, last menstrual period 08/28/2020, SpO2 100%.    Vent Mode: PSV FiO2 (%):  [30 %-40 %] 30 % Set Rate:  [20 bmp] 20 bmp Vt Set:  [400 mL] 400 mL PEEP:  [5 cmH20] 5 cmH20 Pressure Support:  [4 cmH20-8 cmH20] 4 cmH20 Plateau Pressure:  [12 cmH20-14 cmH20] 13 cmH20   Intake/Output Summary (Last 24 hours) at 12/08/2023 0824 Last data filed at 12/08/2023 0500 Gross per 24 hour  Intake 2704.42 ml  Output 4650 ml  Net -1945.58 ml   Filed Weights   12/05/23 0500 12/06/23 0500 12/08/23 0307  Weight: 74.6 kg 74.5 kg 65.1 kg   Physical Exam: Gen:      Intubated, sedated, acutely ill appearing HEENT:  ETT to vent Lungs:    sounds of mechanical ventilation auscultated no wheeze, improved secretions. CV:         tachycardic, regular Abd:      + bowel sounds; soft, non-tender; no palpable masses, no  distension Ext:    No edema Skin:      Warm and dry; no rashes Neuro:   sedated on precedex, opens eyes to verbal stimulus but does not track or follow commands. Left sided hemiparesis.  Resp culture: few GPC, rare GNR, few yeast>normal flora 3/31 resp culture: abundant PMNs, rare GPC in pairs, rare GNR, rare yeast with pseudohyphae> normal flora  Labs pending this am.  Resolved Hospital Problem list   Hypokalemia  Assessment & Plan:   Status epilepticus:  Home lamictal, topamax however recently off due to mix-up. CTA neg for LVO. CT spin no acute fx. Acute encephalopathy 2/2 above: MR Brain with findings c/w prolonged status epilepticus Discussed with neurology, plan is to taper klonazepam today as well as vimpat. Working way to home AED regiment of lamictal and topamax. -seizure precautions -treat fevers aggressively -ativan PRN for clinical seizures  Acute respiratory failure with hypoxia requiring mechanical ventilation Aspiration pneumonia, culture shows normal flora Air hunger Post-extubation stridor, delayed; reintubated with 7.5 ETT -LTVV -VAP prevention protocol -PAD protocol - continue steroids, -HAP coverage 7 days of escalated antibiotics (4/1-4/7) due to failure to respond to previous regimen, despite all cultures showing normal flora.  - will diurese again since she responded well. Follow bmet for renal function - hoping to SBT and extubate today.  Septic shock secondary to aspiration pneumonia. CNS infection ruled out.  -NE as required with sedation; goal MAP >65 - zosyn for 7 days  New onset cardiomyopathy with EF 40-45% and new LV WMA, suspect stress- induced Troponin elevation-- favor due to seizures, but no ischemia evaluation yet. Unlikely NSTEMI/ ACS. Echo newly depressed EF 40-45% with LV WMA, grade I DD, small pericardial effusion without tamponade History of hypertension -Appreciate Cardiology input. Not a candidate for cath due to critical illness. Likely stress induced CM -eventually needs repeat echo when more stable to determine if she has recovered or required GDMT - diuresis  AKI 2/2 rhabdo, ATN, CIN- improved UOP, improved Cr Metabolic acidosis hypernatremia -con't diuresis -maintain adequate perfusion -strict I/O -renally dose meds, avoid nephrotoxic meds -continue FWF  Rhabdo due to seizures -continues to improve -can recheck periodically  Transaminase elevation - improving, was likely related  to rhabdo rather than GI pathology Elevated INR - normalized Abd [place] neg with doppler for thrombosis, normal liver, mild ascites, small right pleural effusion. Acute hepatitis panel neg -completed 3 days of NAC -monitor periodically; GI has signed off  Thrombocytopenia - in setting of liver dysfunction, sepsis. Improved. -recheck tomorrow  Anemia of critical illness, dilution (almost 18L positive this admission) -diuresis -limiting lab draws as appropriate  History of ETOH abuse - reportedly sober for over a year -vitamins -recommend cessation  Hyperglycemia due to steroids -SSI PRN -glargine 8 units once since she is getting steroids today -goal BG 140-180  Best Practice (right click and "Reselect all SmartList Selections" daily)   Diet/type: tubefeeds DVT prophylaxis prophylactic heparin    Pressure ulcer(s): N/A GI prophylaxis: H2B Lines: N/A Foley:  N/A Code Status:  full code Last date of multidisciplinary goals of care discussion [ ]   Updated family including mother on 3/30 on telephone  Critical care time:     The patient is critically ill due to respiratory failure, encephalopathy, status epilepticus.  Critical care was necessary to treat or prevent imminent or life-threatening deterioration.  Critical care was time spent personally by me on the following activities: development of treatment plan with patient and/or surrogate as well as nursing,  discussions with consultants, evaluation of patient's response to treatment, examination of patient, obtaining history from patient or surrogate, ordering and performing treatments and interventions, ordering and review of laboratory studies, ordering and review of radiographic studies, pulse oximetry, re-evaluation of patient's condition and participation in multidisciplinary rounds.   Critical Care Time devoted to patient care services described in this note is 36 minutes. This time reflects time of care of this signee Charlott Holler . This critical care time does not reflect separately billable procedures or procedure time, teaching time or supervisory time of PA/NP/Med student/Med Resident etc but could involve care discussion time.       Charlott Holler [facility] 12/08/2023 8:24 AM  Pager: see AMION  If no response to pager , please call critical care on call (see AMION) until 7pm After 7:00 pm call Elink

## 2023-12-08 NOTE — Progress Notes (Signed)
 Pharmacy Antibiotic Note Meredith Park is a 53 y.o. female admitted on 11/27/2023 with worsening fevers on ceftriaxone. Pharmacy has been consulted for Zosyn dosing.  Plan: Zosyn 3.375g IV Q8h (extended 4hr infusion) > planning for 7 days total Trend WBC, fever, renal function F/u cultures, clinical progress, levels as indicated De-escalate when able  Height: 5' 2.01" (157.5 cm) Weight: 65.1 kg (143 lb 8.3 oz) IBW/kg (Calculated) : 50.12  Temp (24hrs), Avg:99.2 F (37.3 C), Min:98.5 F (36.9 C), Max:99.4 F (37.4 C)  Recent Labs  Lab 12/03/23 0601 12/04/23 0522 12/05/23 0945 12/06/23 0543 12/07/23 0612 12/07/23 0829 12/08/23 0931  WBC 6.5 8.1 7.3  --   --  14.2* 11.0*  CREATININE 1.73* 1.75* 1.71* 1.72* 1.60*  --  1.55*    Estimated Creatinine Clearance: 37.6 mL/min (A) (by C-G formula based on SCr of 1.55 mg/dL (H)).    Allergies  Allergen Reactions   Meperidine Itching   Hydrochlorothiazide Other (See Comments)    Raises ammonia level   Keppra [Levetiracetam] Other (See Comments)    Behavioral, suicidal ideation     Antimicrobials this admission: Ampicillin 3/26 > 3/27 Vanc 3/26 > 3/27 CRO 3/26 > 4/01 Zosyn 4/01 >>   Microbiology results: 3/29 TA: GPCs, GNRs 3/26 MRSA PCR: negative  Thank you for allowing pharmacy to be a part of this patient's care.  Thelma Barge, PharmD, BCPS Clinical Pharmacist

## 2023-12-09 ENCOUNTER — Inpatient Hospital Stay (HOSPITAL_COMMUNITY)

## 2023-12-09 ENCOUNTER — Inpatient Hospital Stay

## 2023-12-09 DIAGNOSIS — G40901 Epilepsy, unspecified, not intractable, with status epilepticus: Secondary | ICD-10-CM | POA: Diagnosis not present

## 2023-12-09 DIAGNOSIS — R4182 Altered mental status, unspecified: Secondary | ICD-10-CM | POA: Diagnosis not present

## 2023-12-09 DIAGNOSIS — G936 Cerebral edema: Secondary | ICD-10-CM | POA: Diagnosis not present

## 2023-12-09 DIAGNOSIS — J9601 Acute respiratory failure with hypoxia: Secondary | ICD-10-CM | POA: Diagnosis not present

## 2023-12-09 DIAGNOSIS — G9340 Encephalopathy, unspecified: Secondary | ICD-10-CM | POA: Diagnosis not present

## 2023-12-09 DIAGNOSIS — J690 Pneumonitis due to inhalation of food and vomit: Secondary | ICD-10-CM | POA: Diagnosis not present

## 2023-12-09 LAB — GLUCOSE, CAPILLARY
Glucose-Capillary: 135 mg/dL — ABNORMAL HIGH (ref 70–99)
Glucose-Capillary: 149 mg/dL — ABNORMAL HIGH (ref 70–99)
Glucose-Capillary: 127 mg/dL — ABNORMAL HIGH (ref 70–99)
Glucose-Capillary: 127 mg/dL — ABNORMAL HIGH (ref 70–99)
Glucose-Capillary: 213 mg/dL — ABNORMAL HIGH (ref 70–99)

## 2023-12-09 LAB — SODIUM
Sodium: 144 mmol/L (ref 135–145)
Sodium: 145 mmol/L (ref 135–145)

## 2023-12-09 LAB — BASIC METABOLIC PANEL WITH GFR
Anion gap: 13 (ref 5–15)
BUN: 62 mg/dL — ABNORMAL HIGH (ref 6–20)
CO2: 23 mmol/L (ref 22–32)
Calcium: 8.5 mg/dL — ABNORMAL LOW (ref 8.9–10.3)
Chloride: 109 mmol/L (ref 98–111)
Creatinine, Ser: 1.48 mg/dL — ABNORMAL HIGH (ref 0.44–1.00)
GFR, Estimated: 42 mL/min — ABNORMAL LOW (ref >60)
Glucose, Bld: 160 mg/dL — ABNORMAL HIGH (ref 70–99)
Potassium: 3.3 mmol/L — ABNORMAL LOW (ref 3.5–5.1)
Sodium: 145 mmol/L (ref 135–145)

## 2023-12-09 LAB — LIPID PANEL
Cholesterol: 165 mg/dL (ref 0–200)
HDL: 37 mg/dL — ABNORMAL LOW (ref >40)
LDL Cholesterol: 94 mg/dL (ref 0–99)
Total CHOL/HDL Ratio: 4.5 ratio
Triglycerides: 171 mg/dL — ABNORMAL HIGH (ref <150)
VLDL: 34 mg/dL (ref 0–40)

## 2023-12-09 MED ORDER — FUROSEMIDE 10 MG/ML IJ SOLN
60.0000 mg | Freq: Four times a day (QID) | INTRAMUSCULAR | Status: AC
  Administered 2023-12-09 (×2): 60 mg via INTRAVENOUS
  Filled 2023-12-09 (×2): qty 6

## 2023-12-09 MED ORDER — GADOBUTROL 1 MMOL/ML IV SOLN
6.3000 mL | Freq: Once | INTRAVENOUS | Status: AC | PRN
Start: 2023-12-09 — End: 2023-12-09
  Administered 2023-12-09: 6.3 mL via INTRAVENOUS

## 2023-12-09 MED ORDER — SODIUM CHLORIDE 0.9% FLUSH: 10.0000 mL | INTRAVENOUS | Status: DC | PRN

## 2023-12-09 MED ORDER — POTASSIUM CHLORIDE 20 MEQ PO PACK
20.0000 meq | PACK | ORAL | Status: AC
  Administered 2023-12-09 (×2): 20 meq
  Filled 2023-12-09 (×2): qty 1

## 2023-12-09 MED ORDER — POTASSIUM CHLORIDE 10 MEQ/100ML IV SOLN
10.0000 meq | INTRAVENOUS | Status: AC
  Administered 2023-12-09 (×4): 10 meq via INTRAVENOUS
  Filled 2023-12-09 (×4): qty 100

## 2023-12-09 MED ORDER — PANTOPRAZOLE SODIUM 40 MG IV SOLR
40.0000 mg | INTRAVENOUS | Status: DC
  Administered 2023-12-09 – 2023-12-10 (×2): 40 mg via INTRAVENOUS
  Filled 2023-12-09 (×2): qty 10

## 2023-12-09 MED ORDER — IOHEXOL 350 MG/ML SOLN
75.0000 mL | Freq: Once | INTRAVENOUS | Status: AC | PRN
  Administered 2023-12-09: 75 mL via INTRAVENOUS

## 2023-12-09 MED ORDER — METHYLPREDNISOLONE SODIUM SUCC 1000 MG IJ SOLR
1000.0000 mg | INTRAMUSCULAR | Status: DC
  Administered 2023-12-09 – 2023-12-10 (×2): 1000 mg via INTRAVENOUS
  Filled 2023-12-09 (×4): qty 16

## 2023-12-09 MED ORDER — NYSTATIN 100000 UNIT/GM EX CREA
TOPICAL_CREAM | Freq: Two times a day (BID) | CUTANEOUS | Status: DC
  Administered 2023-12-09 – 2023-12-17 (×6): 1 via TOPICAL
  Filled 2023-12-09 (×4): qty 30

## 2023-12-09 MED ORDER — SODIUM CHLORIDE 0.9% FLUSH
10.0000 mL | Freq: Two times a day (BID) | INTRAVENOUS | Status: DC
  Administered 2023-12-09: 20 mL
  Administered 2023-12-10 (×2): 10 mL
  Administered 2023-12-11: 30 mL
  Administered 2023-12-11 – 2023-12-17 (×12): 10 mL

## 2023-12-09 NOTE — Plan of Care (Signed)
 PROGRESS NOTE  MR brain Progression of holohemispheric edema of the right cerebral hemisphere that also now includes WM. Remains more consistent with seizure edema.  CTA H+N No ELVO  Neurology to follow  -- Milon Dikes, MD Neurology

## 2023-12-09 NOTE — Progress Notes (Signed)
 [facility] ADULT ICU REPLACEMENT PROTOCOL   The patient does apply for the [facility] Adult ICU Electrolyte Replacment Protocol based on the criteria listed below:   1.Exclusion criteria: TCTS, ECMO, Dialysis, and Myasthenia Gravis patients 2. Is GFR >/= 30 ml/min? Yes.    Patient's GFR today is 42 3. Is SCr </= 2? Yes.   Patient's SCr is 1.48 mg/dL 4. Did SCr increase >/= 0.5 in 24 hours? No. 5.Pt's weight >40kg  Yes.   6. Abnormal electrolyte(s): K  7. Electrolytes replaced per protocol 8.  Call MD STAT for K+ </= 2.5, Phos </= 1, or Mag </= 1 Physician:  Flossie Buffy E Leandrea Ackley 12/09/2023 5:22 AM

## 2023-12-09 NOTE — Progress Notes (Signed)
 NAME:  Meredith Park, MRN:  [id number], DOB:  February 03, 1971, LOS: 12 ADMISSION DATE:  11/27/2023, CONSULTATION DATE: 3/25 REFERRING MD: Dr. Charm Barges EDP, CHIEF COMPLAINT: Seizure  History of Present Illness:  53 year old female with past medical history as below, which is significant for seizure disorder followed at [facility] neurology, alcohol abuse reportedly sober for 1 year, hypertension, marijuana use, and hyperlipidemia.  Seizures are managed with Lamictal and recently added topamax with better control of seizures.  Last seen by neurology 07/19/2023.  Family reports she did have a mixup with her seizure medications a week prior to arrival.  She ran out of one of her medications and did not realize it.  This has been remedied and she is back taking both medications.  The patient was in her usual state of health until 3/25 when she was found down beside her bed by her family at approximately 7 PM.  Last known well around 2:30 PM.  Her mother found her and noticed her leg shaking and she was unresponsive.  EMS was called upon EMS arrival she was noted to have continuous full body seizure as well as vomiting.  She was treated with 10 mg of IM Versed with cessation of seizures.  She remained minimally responsive in the emergency department.  She was hypoxic requiring 10 L of oxygen.  Chest x-ray was unremarkable.  CT of the head and C-spine were unremarkable for acute issues with the exception of findings associated with chronic paranasal sinusitis.  Due to poor responsiveness PCCM was asked to evaluate for ICU admission.  Pertinent  Medical History   has a past medical history of Alcohol abuse, Alcohol withdrawal (HCC) (08/28/2018), High cholesterol, Hypertension, and Seizures (HCC).   Significant Hospital Events: Including procedures, antibiotic start and stop dates in addition to other pertinent events   3/25 admit to ICU. Intubated 3/26 LP performed. Increasing pressor requirement 3/27 Increased  vasopressor requirement with worsening MOD including AKI, shock liver. Echo with stress CM. 3/29 Improved vasopressor requirements 4/2 central line d/c, extubated (had cuff leak), reintubated for stridor & secretions several hours later  Interim History / Subjective:  Ct head last night concerning for stroke - discussed with neurology and this could be more consistent with seizure related changes. She is awake this morning.   Objective   Blood pressure (!) 151/91, pulse 97, temperature 98.5 F (36.9 C), temperature source Axillary, resp. rate (!) 22, height 5' 2.01" (1.575 m), weight 63.3 kg, last menstrual period 08/28/2020, SpO2 100%.    Vent Mode: PSV FiO2 (%):  [30 %-40 %] 30 % Set Rate:  [20 bmp] 20 bmp Vt Set:  [400 mL] 400 mL PEEP:  [5 cmH20] 5 cmH20 Pressure Support:  [4 cmH20-5 cmH20] 5 cmH20 Plateau Pressure:  [13 cmH20] 13 cmH20   Intake/Output Summary (Last 24 hours) at 12/09/2023 0858 Last data filed at 12/09/2023 0800 Gross per 24 hour  Intake 4080.15 ml  Output 3900 ml  Net 180.15 ml   Filed Weights   12/06/23 0500 12/08/23 0307 12/09/23 0315  Weight: 74.5 kg 65.1 kg 63.3 kg   Physical Exam: Gen:      Intubated, sedated, acutely ill appearing HEENT:  ETT to vent Lungs:    sounds of mechanical ventilation auscultated no wheeze CV:         RR Abd:      + bowel sounds; soft, non-tender; no palpable masses, no distension Ext:    No edema Skin:  Warm and dry; no rashes Neuro:   sedated, RASS -1, follows commands with RUE, hemiplegia on the left. Does not track  Na 145 K 3.3 Cr 1.48  Resolved Hospital Problem list    rhabdomyolysis  Assessment & Plan:   Status epilepticus: Home lamictal, topamax however recently off due to mix-up. CTA neg for LVO. CT spin no acute fx. Acute encephalopathy 2/2 above: MR Brain with findings c/w prolonged status epilepticus Working way to home AED regiment of lamictal and topamax. -seizure precautions -treat fevers  aggressively -ativan PRN for clinical seizures - discussed CT Head with neurology - they will see today.  Acute respiratory failure with hypoxia requiring mechanical ventilation Aspiration pneumonia, culture shows normal flora Air hunger Post-extubation stridor, delayed; reintubated with 7.5 ETT -LTVV -VAP prevention protocol -PAD protocol - continue steroids, -HAP coverage 7 days of escalated antibiotics (4/1-4/7) due to failure to respond to previous regimen, despite all cultures showing normal flora.  - continue to diurese and aim for net negative fluid balance  Septic shock secondary to aspiration pneumonia. CNS infection ruled out.  -NE as required with sedation; goal MAP >65 - zosyn for 7 days  New onset cardiomyopathy with EF 40-45% and new LV WMA, suspect stress- induced Troponin elevation-- favor due to seizures, but no ischemia evaluation yet. Unlikely NSTEMI/ ACS. Echo newly depressed EF 40-45% with LV WMA, grade I DD, small pericardial effusion without tamponade History of hypertension -Appreciate Cardiology input. Not a candidate for cath due to critical illness. Likely stress induced CM -eventually needs repeat echo when more stable to determine if she has recovered or required GDMT - diuresis  AKI 2/2 rhabdo, ATN, CIN- improved UOP, improved Cr Metabolic acidosis hypernatremia -con't diuresis -maintain adequate perfusion -strict I/O -renally dose meds, avoid nephrotoxic meds -continue FWF - Aki improving with diuresis.   Hypokalemia - replace, repeat  bmet - likely form diuresis  Transaminase elevation - improving, was likely related to rhabdo rather than GI pathology Elevated INR - normalized Abd [place] neg with doppler for thrombosis, normal liver, mild ascites, small right pleural effusion. Acute hepatitis panel neg -completed 3 days of NAC -monitor periodically; GI has signed off  Thrombocytopenia - in setting of liver dysfunction, sepsis.  Improved. -recheck tomorrow  Anemia of critical illness, dilution  -diuresis -limiting lab draws as appropriate  History of ETOH abuse - reportedly sober for over a year -vitamins -recommend cessation  Hyperglycemia due to steroids -SSI PRN -glargine 8 units once since she is getting steroids today -goal BG 140-180  Best Practice (right click and "Reselect all SmartList Selections" daily)   Diet/type: tubefeeds DVT prophylaxis prophylactic heparin    Pressure ulcer(s): N/A GI prophylaxis: H2B Lines: N/A Foley:  N/A Code Status:  full code Last date of multidisciplinary goals of care discussion [ updated mother at bedside 4/4. Will update again today after discussing with neurology.]   Critical care time:     The patient is critically ill due to respiratory failure, encephalopathy, seizures.  Critical care was necessary to treat or prevent imminent or life-threatening deterioration.  Critical care was time spent personally by me on the following activities: development of treatment plan with patient and/or surrogate as well as nursing, discussions with consultants, evaluation of patient's response to treatment, examination of patient, obtaining history from patient or surrogate, ordering and performing treatments and interventions, ordering and review of laboratory studies, ordering and review of radiographic studies, pulse oximetry, re-evaluation of patient's condition and participation in multidisciplinary rounds.  Critical Care Time devoted to patient care services described in this note is 35 minutes. This time reflects time of care of this signee Charlott Holler . This critical care time does not reflect separately billable procedures or procedure time, teaching time or supervisory time of PA/NP/Med student/Med Resident etc but could involve care discussion time.       Charlott Holler [facility] 12/09/2023 8:58 AM  Pager: see AMION  If no  response to pager , please call critical care on call (see AMION) until 7pm After 7:00 pm call Elink

## 2023-12-09 NOTE — Progress Notes (Signed)
 LTM EEG hooked up and running - no initial skin breakdown - push button tested - [facility] monitoring. MRI compatible leads.

## 2023-12-09 NOTE — Progress Notes (Signed)
 Peripherally Inserted Central Catheter Placement  The IV Nurse has discussed with the patient and/or persons authorized to consent for the patient, the purpose of this procedure and the potential benefits and risks involved with this procedure.  The benefits include less needle sticks, lab draws from the catheter, and the patient may be discharged home with the catheter. Risks include, but not limited to, infection, bleeding, blood clot (thrombus formation), and puncture of an artery; nerve damage and irregular heartbeat and possibility to perform a PICC exchange if needed/ordered by physician.  Alternatives to this procedure were also discussed.  Bard Power PICC patient education guide, fact sheet on infection prevention and patient information card has been provided to patient /or left at bedside. Telephone Consent signed by mother [name].   PICC Placement Documentation  PICC Triple Lumen 12/09/23 Right Brachial 35 cm 0 cm (Active)  Indication for Insertion or Continuance of Line Vasoactive infusions 12/09/23 1719  Exposed Catheter (cm) 0 cm 12/09/23 1719  Site Assessment Clean, Dry, Intact 12/09/23 1719  Lumen #1 Status Saline locked;Blood return noted 12/09/23 1719  Lumen #2 Status Saline locked;Blood return noted 12/09/23 1719  Lumen #3 Status Saline locked;Blood return noted 12/09/23 1719  Dressing Type Transparent;Securing device 12/09/23 1719  Dressing Status Antimicrobial disc/dressing in place;Clean, Dry, Intact 12/09/23 1719  Line Adjustment (NICU/IV Team Only) No 12/09/23 1719  Dressing Intervention New dressing;Adhesive placed at insertion site (IV team only) 12/09/23 1719  Dressing Change Due 12/16/23 12/09/23 1719       Meredith Park 12/09/2023, 5:21 PM

## 2023-12-09 NOTE — Progress Notes (Signed)
 SLP Cancellation Note  Patient Details Name: Tiffini Blacksher MRN: [id number] DOB: [birth year]   Cancelled treatment:       Reason Eval/Treat Not Completed: Patient not medically ready. Remains on vent per chart. Will f/u   Adolfo Granieri, Riley Nearing 12/09/2023, 7:24 AM

## 2023-12-09 NOTE — Progress Notes (Signed)
 NEUROLOGY CONSULT FOLLOW UP NOTE   Date of service: December 09, 2023 Patient Name: Meredith Park MRN:  [id number] DOB:  [birth year]  Interval Hx/subjective   MRI Brain with progression of unihemispheric edema of the right cerebral hemisphere, which now includes most of the white matter.  CT Head yesterday was concerning for a large R hemispheric stroke and she was therefore started on hypertonic saline briefly.  Vitals   Vitals:   12/09/23 0900 12/09/23 0915 12/09/23 0930 12/09/23 1044  BP: (!) 148/90 (!) 138/90 132/87   Pulse: 99 (!) 103 (!) 106 85  Resp: (!) 25 (!) 21 (!) 23 13  Temp:      TempSrc:      SpO2: 100% 100% 100% 100%  Weight:      Height:         Body mass index is 25.52 kg/m.  Physical Exam  Gen: intubated sedated, in no distress HEENT [place] AT [facility] RRR Resp: intubated NEUROLOGICAL EXAM Drowsy but opens eyes to nox stim, alert once stimulated, right gaze preference, follows commands on the right. Nods her head to answer questions. CN: PERRL, EOM exam shows right gaze preference, no blink from left, facial symmetry diffcult to ascertain due to tube.  Motor: spontaneous right UE and LE movement. Follows commands in RUE and RLE. Flaccid LUE and LLE. Sensory: no withdrawal on left, localizes on right. Coord: can not assess   Medications  Current Facility-Administered Medications:    acetaminophen (TYLENOL) tablet 650 mg, 650 mg, Per Tube, Q4H PRN, Steffanie Dunn, DO, 650 mg at 12/07/23 1136   Chlorhexidine Gluconate Cloth 2 % PADS 6 each, 6 each, Topical, Daily, Duayne Cal, NP, 6 each at 12/08/23 1200   dexmedetomidine (PRECEDEX) 400 MCG/100ML (4 mcg/mL) infusion, 0-1.2 mcg/kg/hr, Intravenous, Titrated, Cherlynn Polo, Lauren E, PA-C, Last Rate: 22.4 mL/hr at 12/09/23 1051, 1.2 mcg/kg/hr at 12/09/23 1051   docusate (COLACE) 50 MG/5ML liquid 100 mg, 100 mg, Per Tube, BID, Duayne Cal, NP, 100 mg at 12/08/23 2159   docusate sodium (COLACE) capsule 100 mg, 100  mg, Oral, BID PRN, Reita May, Rutwij, MD   famotidine (PEPCID) tablet 20 mg, 20 mg, Per Tube, Daily, Calton Dach I, RPH, 20 mg at 12/09/23 0919   feeding supplement (OSMOLITE 1.5 CAL) liquid 1,000 mL, 1,000 mL, Per Tube, Continuous, Luciano Cutter, MD, Last Rate: 50 mL/hr at 12/09/23 0900, Infusion Verify at 12/09/23 0900   feeding supplement (PROSource TF20) liquid 60 mL, 60 mL, Per Tube, BID, Karie Fetch P, DO, 60 mL at 12/09/23 0916   fentaNYL (SUBLIMAZE) injection 50-200 mcg, 50-200 mcg, Intravenous, Q30 min PRN, Cherlynn Polo, Lauren E, PA-C, 100 mcg at 12/09/23 1005   folic acid (FOLVITE) tablet 1 mg, 1 mg, Per Tube, Daily, Luciano Cutter, MD, 1 mg at 12/09/23 0919   furosemide (LASIX) injection 60 mg, 60 mg, Intravenous, Q6H, Charlott Holler, MD, 60 mg at 12/09/23 0919   heparin injection 5,000 Units, 5,000 Units, Subcutaneous, Q8H, Steffanie Dunn, DO, 5,000 Units at 12/09/23 0533   hydrocortisone cream 1 %, , Topical, PRN, Karie Fetch P, DO   insulin aspart (novoLOG) injection 0-9 Units, 0-9 Units, Subcutaneous, Q4H, Luciano Cutter, MD, 1 Units at 12/09/23 0734   ipratropium-albuterol (DUONEB) 0.5-2.5 (3) MG/3ML nebulizer solution 3 mL, 3 mL, Nebulization, Q6H PRN, Reita May, Rutwij, MD, 3 mL at 12/05/23 1808   lacosamide (VIMPAT) 50 mg in sodium chloride 0.9 % 25 mL IVPB, 50 mg, Intravenous, Q12H, Melynda Ripple,  Kristopher Oppenheim, MD, Last Rate: 60 mL/hr at 12/09/23 1050, 50 mg at 12/09/23 1050   lamoTRIgine (LAMICTAL) tablet 250 mg, 250 mg, Per Tube, BID, Charlsie Quest, MD, 250 mg at 12/09/23 0916   levalbuterol (XOPENEX) nebulizer solution 0.63 mg, 0.63 mg, Nebulization, Q6H PRN, Cherlynn Polo, Lauren E, PA-C   LORazepam (ATIVAN) injection 2 mg, 2 mg, Intravenous, PRN, Charlsie Quest, MD, 2 mg at 12/07/23 0422   multivitamin with minerals tablet 1 tablet, 1 tablet, Per Tube, Daily, Calton Dach I, RPH, 1 tablet at 12/09/23 1610   norepinephrine (LEVOPHED) 4mg  in (0.016 mg/mL) premix  infusion, 2-10 mcg/min, Intravenous, Titrated, Millen, Jessica B, RPH, Last Rate: 7.5 mL/hr at 12/09/23 0900, 2 mcg/min at 12/09/23 0900   nystatin cream (MYCOSTATIN), , Topical, BID, Charlott Holler, MD, Given at 12/09/23 9604   Oral care mouth rinse, 15 mL, Mouth Rinse, PRN, Duayne Cal, NP   Oral care mouth rinse, 15 mL, Mouth Rinse, Q2H, Autry, Lauren E, PA-C, 15 mL at 12/09/23 5409   oxyCODONE (Oxy IR/ROXICODONE) immediate release tablet 5 mg, 5 mg, Per Tube, Q6H, Karie Fetch P, DO, 5 mg at 12/09/23 0534   piperacillin-tazobactam (ZOSYN) IVPB 3.375 g, 3.375 g, Intravenous, Q8H, Daylene Posey, RPH, Last Rate: 12.5 mL/hr at 12/09/23 0900, Infusion Verify at 12/09/23 0900   polyethylene glycol (MIRALAX / GLYCOLAX) packet 17 g, 17 g, Oral, Daily PRN, Reita May, Rutwij, MD   polyethylene glycol (MIRALAX / GLYCOLAX) packet 17 g, 17 g, Per Tube, Daily, Duayne Cal, NP, 17 g at 12/06/23 0903   [EXPIRED] thiamine (VITAMIN B1) 500 mg in sodium chloride 0.9 % 50 mL IVPB, 500 mg, Intravenous, Q8H, Stopped at 11/29/23 2314 **FOLLOWED BY** [COMPLETED] thiamine (VITAMIN B1) 250 mg in sodium chloride 0.9 % 50 mL IVPB, 250 mg, Intravenous, Daily, Stopped at 12/05/23 0937 **FOLLOWED BY** thiamine (VITAMIN B1) injection 100 mg, 100 mg, Intravenous, Daily, Erick Blinks, MD, 100 mg at 12/09/23 8119   topiramate (TOPAMAX) tablet 100 mg, 100 mg, Per Tube, QHS, Charlsie Quest, MD, 100 mg at 12/08/23 2159  Labs and Diagnostic Imaging   CBC:  Recent Labs  Lab 12/07/23 0829 12/08/23 0931  WBC 14.2* 11.0*  HGB 9.1* 10.2*  HCT 28.6* 31.8*  MCV 86.4 85.7  PLT 317 337    Basic Metabolic Panel:  Lab Results  Component Value Date   NA 145 12/09/2023   K 3.3 (L) 12/09/2023   CO2 23 12/09/2023   GLUCOSE 160 (H) 12/09/2023   BUN 62 (H) 12/09/2023   CREATININE 1.48 (H) 12/09/2023   CALCIUM 8.5 (L) 12/09/2023   GFRNONAA 42 (L) 12/09/2023   GFRAA >60 11/10/2018   HgbA1c:  Lab Results  Component  Value Date   HGBA1C 5.0 11/29/2023   Urine Drug Screen:     Component Value Date/Time   LABOPIA NONE DETECTED 11/27/2023 2207   COCAINSCRNUR NONE DETECTED 11/27/2023 2207   LABBENZ POSITIVE (A) 11/27/2023 2207   AMPHETMU NONE DETECTED 11/27/2023 2207   THCU POSITIVE (A) 11/27/2023 2207   LABBARB NONE DETECTED 11/27/2023 2207    Alcohol Level     Component Value Date/Time   ETH <10 11/27/2023 2058   INR  Lab Results  Component Value Date   INR 1.1 12/03/2023   APTT No results found for: "APTT" AED levels:  Lab Results  Component Value Date   LAMOTRIGINE 5.5 11/27/2023    CT Head without contrast(Personally reviewed): IMPRESSION: 1. Expected evolution of a large  right hemispheric infarct. 2. Diffuse edema is present with effacement of the sulci and partial effacement the right lateral ventricle. 1-2 mm midline shift is present. 3. 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 5. No acute hemorrhage.  CT angio Head and Neck with contrast(Personally reviewed): No emergent large vessel occlusion or high-grade stenosis of the intracranial arteries.  CT Venogram head(Personally reviewed): No evidence of dural venous sinus thrombosis.  MRI Brain: 11/30/23 -  IMPRESSION: 1. Extensive cortical restricted diffusion in the right cerebral hemisphere and medial right thalamus with contralateral involvement of the cerebellum, pattern compatible with seizure phenomenon in this patient in recent status. 2. Small foci of restricted diffusion in the left temporal white matter more suggestive of white matter infarcts. There is chronic white matter disease.  Repeat MR brain 12/09/23(Personally reviewed): Progression of unihemispheric edema of the right cerebral hemisphere, which now includes most of the white matter.  Echocardiogram 11/28/2023 with LVEF of 40 to 45%, wall motion abnormalities seen.  Grade 1 diastolic dysfunction, small pericardial effusion, LA normal in size,  no shunt by color-flow Doppler  MRI Brain with contrast is pending.  Assessment   Meredith Park is a 53 y.o. female past medical history of alcohol and substance abuse, hypertension, seizures was found wedged between bed and nightstand and seizing for unknown duration.  Brought in after Versed in the field with resolution of clinical seizure activity.  Obtunded mentation after significant passage of time prompted further workup. MRI brain with cortical changes in the right hemisphere and crossed cerebellar diaschisis, concerning more for a postictal phenomenology. EEG with LPD's likely ictal in nature.  CT angiogram was done-no emergent occlusion. CSF studies bland with no pleocytosis, mildly elevated protein, negative meningitis/encephalitis panel, no organisms on CSF gram stain and no growth on CSF cultures x 5 days. Exam remains poor despite resolution of ictal activity on the EEG with AED adjustments.  Repeat head CT done that shows extensive right hemispheric edema more than expected from postictal from neurology. Repeat MRI brain shows progression of earlier noted unihemispheric edema of the right cerebral hemisphere, which now includes most of the white matter.  Etiology of her cortical unilateral brain edema remains unclear, still suspect this is post ictal but with progression of edema, will expand differential to evaluate for potential autoimmune/paraneoplastic/vasculitic pathology and start empiric solumedrol and put her back on LTM EEG.  Recommendations  - MRI brain with contrast. - CT Chest, abdomen and pelvis to evaluate for occult malignancy. - Serum Autoimmune encephalopathy panel, serum paraneoplastic encephalopathy panel, Anti TPO thyroglobulin antibodies. - resume LTM EEG, given noted progression of edema on MRI concerning for possible recurrence of focal status. - continue Topamax 100mg  daily, Lamotrigine 250mg  BID, Vimpat 50mg  BID. - start solumedrol 1000mg  daily IV x 5  doses for concern for potential autoimmune/paraneoplastic pathology. - Neurology to continue to follow  ______________________________________________________________________   Signed, Erick Blinks [facility]  CRITICAL CARE ATTESTATION Performed by: Erick Blinks Total critical care time: 35 minutes Critical care time was exclusive of separately billable procedures and treating other patients and/or supervising APPs/Residents/Students Critical care was necessary to treat or prevent imminent or life-threatening deterioration. This patient is critically ill and at significant risk for neurological worsening and/or death and care requires constant monitoring. Critical care was time spent personally by me on the following activities: development of treatment plan with patient and/or surrogate as well as nursing, discussions with consultants, evaluation of patient's response to treatment, examination of patient, obtaining  history from patient or surrogate, ordering and performing treatments and interventions, ordering and review of laboratory studies, ordering and review of radiographic studies, pulse oximetry, re-evaluation of patient's condition, participation in multidisciplinary rounds and medical decision making of high complexity in the care of this patient.

## 2023-12-10 ENCOUNTER — Encounter (HOSPITAL_COMMUNITY)

## 2023-12-10 ENCOUNTER — Inpatient Hospital Stay (HOSPITAL_COMMUNITY)

## 2023-12-10 DIAGNOSIS — G936 Cerebral edema: Secondary | ICD-10-CM

## 2023-12-10 DIAGNOSIS — R569 Unspecified convulsions: Secondary | ICD-10-CM | POA: Diagnosis not present

## 2023-12-10 DIAGNOSIS — R509 Fever, unspecified: Secondary | ICD-10-CM

## 2023-12-10 DIAGNOSIS — E876 Hypokalemia: Secondary | ICD-10-CM

## 2023-12-10 DIAGNOSIS — J690 Pneumonitis due to inhalation of food and vomit: Secondary | ICD-10-CM | POA: Diagnosis not present

## 2023-12-10 DIAGNOSIS — J9601 Acute respiratory failure with hypoxia: Secondary | ICD-10-CM | POA: Diagnosis not present

## 2023-12-10 DIAGNOSIS — G40901 Epilepsy, unspecified, not intractable, with status epilepticus: Secondary | ICD-10-CM | POA: Diagnosis not present

## 2023-12-10 DIAGNOSIS — G9340 Encephalopathy, unspecified: Secondary | ICD-10-CM | POA: Diagnosis not present

## 2023-12-10 LAB — GLUCOSE, CAPILLARY
Glucose-Capillary: 125 mg/dL — ABNORMAL HIGH (ref 70–99)
Glucose-Capillary: 163 mg/dL — ABNORMAL HIGH (ref 70–99)
Glucose-Capillary: 201 mg/dL — ABNORMAL HIGH (ref 70–99)
Glucose-Capillary: 207 mg/dL — ABNORMAL HIGH (ref 70–99)
Glucose-Capillary: 219 mg/dL — ABNORMAL HIGH (ref 70–99)
Glucose-Capillary: 225 mg/dL — ABNORMAL HIGH (ref 70–99)
Glucose-Capillary: 233 mg/dL — ABNORMAL HIGH (ref 70–99)

## 2023-12-10 LAB — BASIC METABOLIC PANEL WITH GFR
Anion gap: 12 (ref 5–15)
BUN: 68 mg/dL — ABNORMAL HIGH (ref 6–20)
CO2: 22 mmol/L (ref 22–32)
Calcium: 8.6 mg/dL — ABNORMAL LOW (ref 8.9–10.3)
Chloride: 113 mmol/L — ABNORMAL HIGH (ref 98–111)
Creatinine, Ser: 1.53 mg/dL — ABNORMAL HIGH (ref 0.44–1.00)
GFR, Estimated: 41 mL/min — ABNORMAL LOW (ref >60)
Glucose, Bld: 220 mg/dL — ABNORMAL HIGH (ref 70–99)
Potassium: 3.5 mmol/L (ref 3.5–5.1)
Sodium: 147 mmol/L — ABNORMAL HIGH (ref 135–145)

## 2023-12-10 LAB — CBC
HCT: 29.9 % — ABNORMAL LOW (ref 36.0–46.0)
Hemoglobin: 9.2 g/dL — ABNORMAL LOW (ref 12.0–15.0)
MCH: 27.5 pg (ref 26.0–34.0)
MCHC: 30.8 g/dL (ref 30.0–36.0)
MCV: 89.5 fL (ref 80.0–100.0)
Platelets: 458 10*3/uL — ABNORMAL HIGH (ref 150–400)
RBC: 3.34 MIL/uL — ABNORMAL LOW (ref 3.87–5.11)
RDW: 14.6 % (ref 11.5–15.5)
WBC: 11.6 10*3/uL — ABNORMAL HIGH (ref 4.0–10.5)
nRBC: 0 % (ref 0.0–0.2)

## 2023-12-10 LAB — PROCALCITONIN: Procalcitonin: 0.54 ng/mL

## 2023-12-10 MED ORDER — HYDRALAZINE HCL 20 MG/ML IJ SOLN
10.0000 mg | INTRAMUSCULAR | Status: DC | PRN
  Administered 2023-12-10 – 2023-12-11 (×3): 10 mg via INTRAVENOUS
  Filled 2023-12-10 (×3): qty 1

## 2023-12-10 MED ORDER — POTASSIUM CHLORIDE 20 MEQ PO PACK
40.0000 meq | PACK | Freq: Once | ORAL | Status: AC
  Administered 2023-12-10: 40 meq
  Filled 2023-12-10: qty 2

## 2023-12-10 MED ORDER — ORAL CARE MOUTH RINSE
15.0000 mL | OROMUCOSAL | Status: DC
  Administered 2023-12-10 – 2023-12-18 (×48): 15 mL via OROMUCOSAL

## 2023-12-10 MED ORDER — DEXMEDETOMIDINE HCL IN NACL 400 MCG/100ML IV SOLN
0.0000 ug/kg/h | INTRAVENOUS | Status: DC
  Administered 2023-12-10: 0.7 ug/kg/h via INTRAVENOUS
  Administered 2023-12-10: 0.4 ug/kg/h via INTRAVENOUS
  Administered 2023-12-11: 0.7 ug/kg/h via INTRAVENOUS
  Filled 2023-12-10: qty 200
  Filled 2023-12-10 (×2): qty 100

## 2023-12-10 NOTE — Progress Notes (Signed)
 [facility] ADULT ICU REPLACEMENT PROTOCOL   The patient does apply for the [facility] Adult ICU Electrolyte Replacment Protocol based on the criteria listed below:   1.Exclusion criteria: TCTS, ECMO, Dialysis, and Myasthenia Gravis patients 2. Is GFR >/= 30 ml/min? Yes.    Patient's GFR today is 41 3. Is SCr </= 2? Yes.   Patient's SCr is 1.53 mg/dL 4. Did SCr increase >/= 0.5 in 24 hours? No. 5.Pt's weight >40kg  Yes.   6. Abnormal electrolyte(s): K  7. Electrolytes replaced per protocol 8.  Call MD STAT for K+ </= 2.5, Phos </= 1, or Mag </= 1 Physician:  Thersa Salt Lucillia Corson 12/10/2023 5:59 AM

## 2023-12-10 NOTE — Procedures (Addendum)
 Patient Name: Jarrod Bodkins  MRN: [id number]  Epilepsy Attending: Charlsie Quest  Referring Physician/Provider: Erick Blinks, MD  Duration: 12/09/2023 1132 to 12/10/2023 1022   Patient history: 53 y.o. female with hx of alcohol and substance use, Htn, seizures on lamotrigine and topamax who was found seizing for unknown duration. EEG to evaluate for seizure   Level of alertness:  lethargic , asleep   AEDs during EEG study: LTG, TPM, LCM   Technical aspects: This EEG study was done with scalp electrodes positioned according to the 10-20 International system of electrode placement. Electrical activity was reviewed with band pass filter of 1-70Hz , sensitivity of 7 uV/mm, display speed of 38mm/sec with a 60Hz  notched filter applied as appropriate. EEG data were recorded continuously and digitally stored.  Video monitoring was available and reviewed as appropriate.   Description: EEG showed continuous low amplitude 2-3Hz  Hz theta-delta slowing in right fronto-temporal region. EEG also showed 3-7hz  theta-delta slowing in left hemisphere. Sharp waves were noted in left frontotemporal region. Sleep was characterized by vertex waves, sleep spindles (12 to 14 Hz), maximal frontocentral region. Hyperventilation and photic stimulation were not performed.     EEG was disconnected between 12/09/2023 1306 to 1434 for testing.   ABNORMALITY -Sharp wave, left frontotemporal region - Continuous slow, generalized and maximal right fronto-temporal region    IMPRESSION: This study showed evidence of epileptogenicity arising from left frontotemporal region. Additionally there is cortical dysfunction arising from right fronto-temporal region likely secondary to underlying structural abnormality. Lastly there is moderate diffuse encephalopathy. No seizures were noted.   Bambie Pizzolato Annabelle Harman

## 2023-12-10 NOTE — Progress Notes (Signed)
 eLink Physician-Brief Progress Note Patient Name: Catha Ontko DOB: 1970/09/11 MRN: [id number]   Date of Service  12/10/2023  HPI/Events of Note  Hypertensive tonight, home meds of Coreg, Cozaar held.  Stable delirium on Precedex  eICU Interventions  Add as needed hydralazine.  Advance home meds per primary.     Intervention Category Intermediate Interventions: Hypertension - evaluation and management  Nathian Stencil 12/10/2023, 11:33 PM

## 2023-12-10 NOTE — Progress Notes (Signed)
 LTM EEG disconnected - no skin breakdown at [facility].

## 2023-12-10 NOTE — Procedures (Signed)
 Extubation Procedure Note  Patient Details:   Name: Meredith Park DOB: 1970/11/03 MRN: [id number]   Airway Documentation:    Vent end date: 12/10/23 Vent end time: 1140   Evaluation  O2 sats: stable throughout Complications: No apparent complications Patient did tolerate procedure well. Bilateral Breath Sounds: Clear, Diminished   Yes  Patient was extubated per order. Initally did not have a cuff leak, checked again prior to extubation and had a cuff leak. Extubated to 2 LPM nasal cannula. No stridor was noted. BBS Rhonchi. Patient did not cough and was unable to speak name. Vitals are stable and patient is not in any distress. RN at bedside. RT will continue to monitor.   Uriel Horkey M 12/10/2023, 2:25 PM

## 2023-12-10 NOTE — Progress Notes (Signed)
 LTM maint complete - no skin breakdown under: F8, FP2.   No event button with pt due to AMS

## 2023-12-10 NOTE — Progress Notes (Addendum)
 NAME:  Meredith Park, MRN:  [id number], DOB:  1971/02/02, LOS: 13 ADMISSION DATE:  11/27/2023, CONSULTATION DATE: 3/25 REFERRING MD: Dr. Charm Barges EDP, CHIEF COMPLAINT: Seizure  History of Present Illness:  53 year old female with past medical history as below, which is significant for seizure disorder followed at [facility] neurology, alcohol abuse reportedly sober for 1 year, hypertension, marijuana use, and hyperlipidemia.  Seizures are managed with Lamictal and recently added topamax with better control of seizures.  Last seen by neurology 07/19/2023.  Family reports she did have a mixup with her seizure medications a week prior to arrival.  She ran out of one of her medications and did not realize it.  This has been remedied and she is back taking both medications.  The patient was in her usual state of health until 3/25 when she was found down beside her bed by her family at approximately 7 PM.  Last known well around 2:30 PM.  Her mother found her and noticed her leg shaking and she was unresponsive.  EMS was called upon EMS arrival she was noted to have continuous full body seizure as well as vomiting.  She was treated with 10 mg of IM Versed with cessation of seizures.  She remained minimally responsive in the emergency department.  She was hypoxic requiring 10 L of oxygen.  Chest x-ray was unremarkable.  CT of the head and C-spine were unremarkable for acute issues with the exception of findings associated with chronic paranasal sinusitis.  Due to poor responsiveness PCCM was asked to evaluate for ICU admission.  Pertinent  Medical History   has a past medical history of Alcohol abuse, Alcohol withdrawal (HCC) (08/28/2018), High cholesterol, Hypertension, and Seizures (HCC).   Significant Hospital Events: Including procedures, antibiotic start and stop dates in addition to other pertinent events   3/25 admit to ICU. Intubated 3/26 LP performed. Increasing pressor requirement 3/27 Increased  vasopressor requirement with worsening MOD including AKI, shock liver. Echo with stress CM. 3/29 Improved vasopressor requirements 4/2 central line d/c, extubated (had cuff leak), reintubated for stridor & secretions several hours later 4/5 CTH w/progression of unihemispheric edema, thought more c/w seizure changes 4/6 LTM, steroids,   Interim History / Subjective:  No clinical seizures, remains on cEEG Diuresed 4L, -1.7L, sCr stable Intermittently on low dose NE overnight, drops pressure with fentanyl pushes, dex 1.2 Tmax 101.6, WBC stable  Objective   Blood pressure 115/86, pulse 87, temperature (!) 100.8 F (38.2 C), temperature source Axillary, resp. rate 15, height 5' 2.01" (1.575 m), weight 61.4 kg, last menstrual period 08/28/2020, SpO2 99%.    Vent Mode: PRVC FiO2 (%):  [30 %] 30 % Set Rate:  [20 bmp] 20 bmp Vt Set:  [400 mL] 400 mL PEEP:  [5 cmH20] 5 cmH20 Pressure Support:  [5 cmH20] 5 cmH20 Plateau Pressure:  [12 cmH20-14 cmH20] 12 cmH20   Intake/Output Summary (Last 24 hours) at 12/10/2023 0921 Last data filed at 12/10/2023 0700 Gross per 24 hour  Intake 2132.69 ml  Output 4400 ml  Net -2267.31 ml   Filed Weights   12/08/23 0307 12/09/23 0315 12/10/23 0500  Weight: 65.1 kg 63.3 kg 61.4 kg   Physical Exam: Dex 1.2 General:  critically ill adult female  HEENT: MM pink/moist, LTM, pupils 3/r, anicteric, cortrak Neuro: will open eyes but not track, not consistently f/c on R side, frequently agitated, left flaccid CV: rr, NSR, no murmur, RUE PICC PULM:  non labored, PSV 5/5, coarse, no secretions, slightly  delayed cough GI: soft, bs+, NT, foley, FMS Extremities: warm/dry, no LE edema  Skin: nearly resolved rash to groin/ abd fold areas  Labs reviewed> stable BUN/ sCr, K 3.5, Na 147, Cl 133, WBC 11> 11.6, stable H/H  CBG trending > 200 after solumedrol UOP 4L -1.7L Net +3.6L Stool 375 ml/ 24hrs   Resolved Hospital Problem list   Rhabdomyolysis Transaminitis/  elevated INR Thrombocytopenia   Assessment & Plan:   Status epilepticus: Home lamictal, topamax however recently off due to mix-up. CTA neg for LVO. CT spin no acute fx. Unihemispheric edema of right cerebral hemisphere c/w post ictal Acute encephalopathy 2/2 above: MR Brain with findings c/w prolonged status epilepticus - Appreciate Neuro assistance/ input -cont LTM> evidence of epileptogenicity from L frontotemporal region, cortical dysfunction from R fronto-temporal region, moderate diffuse encephalopathy, no seizures - AEDs per neuro> vimpat, lamictal, topamax - cont 1g/ daily solumedrol per Neuro x 5 days w/ PPI - autoimmune and paraneoplastic encephalopathy panel pending, anti-TIP thyroglobulin ab pending (sent 4/6) - CT a/p > no concerns for malignancy - seizure precautions w/ prn ativan for clinical seizures - serial neuro exams  Acute respiratory failure with hypoxia requiring mechanical ventilation Aspiration pneumonia, culture shows normal flora Post-extubation stridor, delayed; reintubated with 7.5 ETT 3/26 - plans for extubation today if cuff leak present.   Airway watch, mother ok with reintubation/ trach if needed - VAP/ PPI - PAD protocol> minimize dex as able.  Hold enteral oxy, prn fentanyl w/extubation plans - SLP when able, cortrak to remain.  NPO for extubation - pulm hygiene - abx as below  Addendum> no cuff leak present.  Discussed with attending, will proceed with trial extubation, trach if fails  Septic shock secondary to aspiration pneumonia, resolved CNS infection ruled out.  - BP soft at times after fentanyl pushes and with diureses - NE prn, MAP goal >65 - hold diureses today, clinically euvolemic - stop zosy, day 7, WBC down/ stable, secretions ok, still intermittent fevers, ?neuro, cultures have been negative to date and CT c/a/p without evidence of infectious process  New onset cardiomyopathy with EF 40-45% and new LV WMA, suspect stress-  induced Troponin elevation-- favor due to seizures, but no ischemia evaluation yet. Unlikely NSTEMI/ ACS. Echo newly depressed EF 40-45% with LV WMA, grade I DD, small pericardial effusion without tamponade History of hypertension -Appreciate Cardiology input. Not a candidate for cath due to critical illness. Likely stress induced CM -eventually needs repeat echo when more stable to determine if she has recovered or required GDMT - hold diuresis today, assess daily   AKI 2/2 rhabdo, ATN, CIN, improving Metabolic acidosis hypernatremia - stable renal function - good diureses with lasix, hold today, assess daily - may need to restart FWF - d/c foley - trend renal indices  - strict I/Os, daily wts - avoid nephrotoxins, renal dose meds, hemodynamic support as above  Hypokalemia - trend on labs, replete prn, check mag  Anemia of critical illness - stable, no evidence of bleeding.   - trend CBC  History of ETOH abuse - reportedly sober for over a year - empiric thiamine  Hyperglycemia due to steroids - SSI PRN - improving, monitor closely, may need to add basal while on steroids  - goal BG 140-180  Best Practice (right click and "Reselect all SmartList Selections" daily)   Diet/type: tubefeeds DVT prophylaxis prophylactic heparin    Pressure ulcer(s): N/A GI prophylaxis: PPI Lines: RUE PICC>  Foley:  Yes, and it is  no longer needed and removal ordered  Code Status:  full code Last date of multidisciplinary goals of care discussion [ updated mother at bedside 4/4. Will update again today after discussing with neurology.]   Mother updated by Dr. Katrinka Blazing 4/7  Critical care time: 32 mins     Posey Boyer, MSN, AG-ACNP-BC  Pulmonary & Critical Care 12/10/2023, 9:21 AM  See Amion for pager If no response to pager , please call [phone] until 7pm After 7:00 pm call Elink  [phone]

## 2023-12-10 NOTE — Progress Notes (Addendum)
 Subjective: No seizures on EEG.  Awake, following commands.  Tmax 101.53F  ROS: Unable to obtain due to poor mental status  Examination  Vital signs in last 24 hours: Temp:  [97 F (36.1 C)-101.5 F (38.6 C)] 100.8 F (38.2 C) (04/07 0700) Pulse Rate:  [75-293] 87 (04/07 0754) Resp:  [10-31] 15 (04/07 0754) BP: (74-159)/(55-109) 115/86 (04/07 0754) SpO2:  [65 %-100 %] 99 % (04/07 0800) FiO2 (%):  [30 %] 30 % (04/07 0754) Weight:  [61.4 kg] 61.4 kg (04/07 0500)  General: lying in bed, intubated Neuro: Awake, follows commands but does not track examiner in room, appears to have right gaze preference but no forced gaze deviation, antigravity strength in right upper and right lower extremity, withdraws to noxious stimuli left upper and left lower extremity  Basic Metabolic Panel: Recent Labs  Lab 12/06/23 0543 12/07/23 0612 12/08/23 0931 12/08/23 1923 12/09/23 0312 12/09/23 0932 12/10/23 0444  NA 146* 145 143 145 145  144 145 147*  K 4.3 3.8 3.3*  --  3.3*  --  3.5  CL 107 109 105  --  109  --  113*  CO2 22 23 24   --  23  --  22  GLUCOSE 188* 190* 163*  --  160*  --  220*  BUN 64* 67* 60*  --  62*  --  68*  CREATININE 1.72* 1.60* 1.55*  --  1.48*  --  1.53*  CALCIUM 8.5* 8.4* 8.7*  --  8.5*  --  8.6*  MG  --  2.2  --   --   --   --   --     CBC: Recent Labs  Lab 12/04/23 0522 12/05/23 0945 12/05/23 2104 12/07/23 0829 12/08/23 0931 12/10/23 0444  WBC 8.1 7.3  --  14.2* 11.0* 11.6*  HGB 10.0* 9.4* 8.8* 9.1* 10.2* 9.2*  HCT 31.3* 30.2* 26.0* 28.6* 31.8* 29.9*  MCV 86.2 87.3  --  86.4 85.7 89.5  PLT 120* 124*  --  317 337 458*     Coagulation Studies: No results for input(s): "LABPROT", "INR" in the last 72 hours.  Imaging personally reviewed  MR brain wo contrast 11/30/2023: Extensive cortical restricted diffusion in the right cerebral hemisphere and medial right thalamus with contralateral involvement of the cerebellum, pattern compatible with seizure  phenomenon in this patient in recent status. Small foci of restricted diffusion in the left temporal white matter more suggestive of white matter infarcts. There is chronic white matter disease.  MR Brain wo contrast 12/08/2023: Progression of holohemispheric edema of the right cerebral hemisphere, which now includes most of the white matter. The pattern remains consistent with ictal phenomenon. Decreased diffusion-weighted imaging intensity within the left cerebellar hemisphere.  MR brain w contrast 12/09/2023:  Diffuse cortical edema and slight increased vascularity to the right hemisphere, so-called intravascular enhancement. No parenchymal enhancement is present. This is nonspecific and could be seen in a postictal state. Similar findings would be present in the setting of encephalitis. No focal enhancement is present to suggest a discrete infection. No other pathologic enhancement.  CT CAP w contrast 12/09/2023: No acute findings or evidence of occult malignancy in the chest, abdomen or pelvis. Small pericardial effusion. Abdominal aortic atherosclerosis. Feeding tube tip in the duodenal bulb.  CTV head 12/09/2023: No evidence of dural venous sinus thrombosis.      ASSESSMENT AND PLAN:53 y.o. female past medical history of alcohol and substance abuse, hypertension, seizures was found wedged between bed and nightstand  and seizing for unknown duration.  Brought in after Versed in the field with resolution of clinical seizure activity.  Obtunded mentation after significant passage of time prompted further workup. MRI brain with cortical changes in the right hemisphere and crossed cerebellar diaschisis, concerning more for a postictal phenomenology. EEG with LPD's likely ictal in nature.  CT angiogram was done-no emergent occlusion. CSF studies bland with no pleocytosis, mildly elevated protein, negative meningitis/encephalitis panel, no organisms on CSF gram stain and no growth on CSF cultures x 5  days. Exam remains poor despite resolution of ictal activity on the EEG with AED adjustments.  Repeat head CT done that shows extensive right hemispheric edema more than expected from postictal from neurology. Repeat MRI brain shows progression of earlier noted unihemispheric edema of the right cerebral hemisphere, which now includes most of the white matter.   Status epilepticus convulsive, resolved Acute encephalopathy due to seizure Cardiomyopathy with reduced EF -- likely secondary to stress Acute respiratory failure with hypoxia  Fever - Etiology of her cortical unilateral brain edema remains unclear, still suspect this is post ictal but with progression of edema, will expand differential to evaluate for potential autoimmune/paraneoplastic/vasculitic pathology and start empiric solumedrol - LTM EEG did not show any seizures therefore we will discontinue  - Serum Autoimmune encephalopathy panel, serum paraneoplastic encephalopathy panel, Anti TPO thyroglobulin antibodies. - continue Topamax 100mg  daily, Lamotrigine 250mg  BID, Vimpat 50mg  BID. - Continue solumedrol 1000mg  daily D2/5 empirically due to concern for potential autoimmune/paraneoplastic pathology. -Due to recurrent fevers, will discuss with ICU team about checking for DVTs again as well as gout - Neurology to continue to follow   I have spent a total of   36 minutes with the patient reviewing hospital notes,  test results, labs and examining the patient as well as establishing an assessment and plan. > 50% of time was spent in direct patient care.        Meredith Park [facility] For questions after 5pm please refer to AMION to reach the Neurologist on call

## 2023-12-11 ENCOUNTER — Inpatient Hospital Stay (HOSPITAL_COMMUNITY)

## 2023-12-11 ENCOUNTER — Other Ambulatory Visit: Payer: Self-pay

## 2023-12-11 ENCOUNTER — Encounter (HOSPITAL_COMMUNITY): Payer: Self-pay

## 2023-12-11 DIAGNOSIS — R609 Edema, unspecified: Secondary | ICD-10-CM

## 2023-12-11 DIAGNOSIS — G936 Cerebral edema: Secondary | ICD-10-CM | POA: Diagnosis not present

## 2023-12-11 DIAGNOSIS — G40901 Epilepsy, unspecified, not intractable, with status epilepticus: Secondary | ICD-10-CM | POA: Diagnosis not present

## 2023-12-11 DIAGNOSIS — R569 Unspecified convulsions: Secondary | ICD-10-CM | POA: Diagnosis not present

## 2023-12-11 DIAGNOSIS — G9340 Encephalopathy, unspecified: Secondary | ICD-10-CM | POA: Diagnosis not present

## 2023-12-11 DIAGNOSIS — J9601 Acute respiratory failure with hypoxia: Secondary | ICD-10-CM | POA: Diagnosis not present

## 2023-12-11 LAB — BASIC METABOLIC PANEL WITH GFR
Anion gap: 13 (ref 5–15)
BUN: 69 mg/dL — ABNORMAL HIGH (ref 6–20)
CO2: 19 mmol/L — ABNORMAL LOW (ref 22–32)
Calcium: 9.2 mg/dL (ref 8.9–10.3)
Chloride: 119 mmol/L — ABNORMAL HIGH (ref 98–111)
Creatinine, Ser: 1.42 mg/dL — ABNORMAL HIGH (ref 0.44–1.00)
GFR, Estimated: 45 mL/min — ABNORMAL LOW (ref >60)
Glucose, Bld: 225 mg/dL — ABNORMAL HIGH (ref 70–99)
Potassium: 3.4 mmol/L — ABNORMAL LOW (ref 3.5–5.1)
Sodium: 151 mmol/L — ABNORMAL HIGH (ref 135–145)

## 2023-12-11 LAB — HEPATIC FUNCTION PANEL
ALT: 106 U/L — ABNORMAL HIGH (ref 0–44)
AST: 82 U/L — ABNORMAL HIGH (ref 15–41)
Albumin: 3 g/dL — ABNORMAL LOW (ref 3.5–5.0)
Alkaline Phosphatase: 47 U/L (ref 38–126)
Bilirubin, Direct: 0.1 mg/dL (ref 0.0–0.2)
Indirect Bilirubin: 0.5 mg/dL (ref 0.3–0.9)
Total Bilirubin: 0.6 mg/dL (ref 0.0–1.2)
Total Protein: 6 g/dL — ABNORMAL LOW (ref 6.5–8.1)

## 2023-12-11 LAB — MAGNESIUM: Magnesium: 2.9 mg/dL — ABNORMAL HIGH (ref 1.7–2.4)

## 2023-12-11 LAB — CBC
HCT: 31.5 % — ABNORMAL LOW (ref 36.0–46.0)
Hemoglobin: 9.9 g/dL — ABNORMAL LOW (ref 12.0–15.0)
MCH: 27.9 pg (ref 26.0–34.0)
MCHC: 31.4 g/dL (ref 30.0–36.0)
MCV: 88.7 fL (ref 80.0–100.0)
Platelets: 519 10*3/uL — ABNORMAL HIGH (ref 150–400)
RBC: 3.55 MIL/uL — ABNORMAL LOW (ref 3.87–5.11)
RDW: 15.2 % (ref 11.5–15.5)
WBC: 14.7 10*3/uL — ABNORMAL HIGH (ref 4.0–10.5)
nRBC: 0 % (ref 0.0–0.2)

## 2023-12-11 LAB — GLUCOSE, CAPILLARY
Glucose-Capillary: 182 mg/dL — ABNORMAL HIGH (ref 70–99)
Glucose-Capillary: 200 mg/dL — ABNORMAL HIGH (ref 70–99)
Glucose-Capillary: 200 mg/dL — ABNORMAL HIGH (ref 70–99)
Glucose-Capillary: 210 mg/dL — ABNORMAL HIGH (ref 70–99)
Glucose-Capillary: 214 mg/dL — ABNORMAL HIGH (ref 70–99)
Glucose-Capillary: 229 mg/dL — ABNORMAL HIGH (ref 70–99)

## 2023-12-11 LAB — THYROID ANTIBODIES (THYROPEROXIDASE & THYROGLOBULIN)
Thyroglobulin Antibody: <1 [IU]/mL (ref 0.0–0.9)
Thyroperoxidase Ab SerPl-aCnc: 16 [IU]/mL (ref 0–34)

## 2023-12-11 LAB — URIC ACID: Uric Acid, Serum: 5.2 mg/dL (ref 2.5–7.1)

## 2023-12-11 MED ORDER — FREE WATER
100.0000 mL | Status: DC
  Administered 2023-12-11 – 2023-12-12 (×6): 100 mL

## 2023-12-11 MED ORDER — PREDNISONE 20 MG PO TABS: 40.0000 mg | ORAL_TABLET | Freq: Every day | ORAL | Status: DC

## 2023-12-11 MED ORDER — PREDNISONE 20 MG PO TABS: 10.0000 mg | ORAL_TABLET | Freq: Every day | ORAL | Status: DC

## 2023-12-11 MED ORDER — BANATROL TF EN LIQD
60.0000 mL | Freq: Two times a day (BID) | ENTERAL | Status: DC
  Administered 2023-12-11 – 2023-12-31 (×37): 60 mL
  Filled 2023-12-11 (×39): qty 60

## 2023-12-11 MED ORDER — PANTOPRAZOLE SODIUM 40 MG IV SOLR
40.0000 mg | Freq: Every day | INTRAVENOUS | Status: DC
  Administered 2023-12-11 – 2023-12-23 (×13): 40 mg via INTRAVENOUS
  Filled 2023-12-11 (×13): qty 10

## 2023-12-11 MED ORDER — ACETAMINOPHEN 325 MG PO TABS
650.0000 mg | ORAL_TABLET | Freq: Four times a day (QID) | ORAL | Status: DC
  Administered 2023-12-11 – 2023-12-17 (×24): 650 mg
  Filled 2023-12-11 (×25): qty 2

## 2023-12-11 MED ORDER — FREE WATER: 200.0000 mL | Status: DC

## 2023-12-11 MED ORDER — METHOCARBAMOL 1000 MG/10ML IJ SOLN
1000.0000 mg | Freq: Three times a day (TID) | INTRAMUSCULAR | Status: AC
  Administered 2023-12-11 – 2023-12-13 (×4): 1000 mg via INTRAVENOUS
  Filled 2023-12-11 (×4): qty 10

## 2023-12-11 MED ORDER — QUETIAPINE FUMARATE 25 MG PO TABS
25.0000 mg | ORAL_TABLET | Freq: Every day | ORAL | Status: DC
Start: 2023-12-11 — End: 2023-12-12
  Administered 2023-12-11: 25 mg
  Filled 2023-12-11: qty 1

## 2023-12-11 MED ORDER — CLONIDINE HCL 0.2 MG PO TABS
0.3000 mg | ORAL_TABLET | Freq: Four times a day (QID) | ORAL | Status: DC
  Administered 2023-12-11 – 2023-12-12 (×4): 0.3 mg
  Filled 2023-12-11 (×4): qty 1

## 2023-12-11 MED ORDER — PREDNISONE 20 MG PO TABS: 30.0000 mg | ORAL_TABLET | Freq: Every day | ORAL | Status: DC

## 2023-12-11 MED ORDER — INSULIN ASPART 100 UNIT/ML IJ SOLN
3.0000 [IU] | INTRAMUSCULAR | Status: DC
  Administered 2023-12-11 – 2023-12-12 (×5): 3 [IU] via SUBCUTANEOUS

## 2023-12-11 MED ORDER — INSULIN ASPART 100 UNIT/ML IJ SOLN
0.0000 [IU] | INTRAMUSCULAR | Status: DC
  Administered 2023-12-11: 3 [IU] via SUBCUTANEOUS
  Administered 2023-12-11: 5 [IU] via SUBCUTANEOUS
  Administered 2023-12-11: 3 [IU] via SUBCUTANEOUS
  Administered 2023-12-11: 5 [IU] via SUBCUTANEOUS
  Administered 2023-12-12 (×2): 3 [IU] via SUBCUTANEOUS

## 2023-12-11 MED ORDER — PREDNISONE 20 MG PO TABS: 50.0000 mg | ORAL_TABLET | Freq: Every day | ORAL | Status: DC

## 2023-12-11 MED ORDER — CARVEDILOL 12.5 MG PO TABS
12.5000 mg | ORAL_TABLET | Freq: Two times a day (BID) | ORAL | Status: DC
  Administered 2023-12-12 – 2023-12-15 (×6): 12.5 mg
  Filled 2023-12-11 (×3): qty 1
  Filled 2023-12-11: qty 2
  Filled 2023-12-11 (×2): qty 1

## 2023-12-11 MED ORDER — PREDNISONE 20 MG PO TABS: 20.0000 mg | ORAL_TABLET | Freq: Every day | ORAL | Status: DC

## 2023-12-11 MED ORDER — SODIUM CHLORIDE 0.9 % IV SOLN
1000.0000 mg | Freq: Once | INTRAVENOUS | Status: AC
  Administered 2023-12-11: 1000 mg via INTRAVENOUS
  Filled 2023-12-11: qty 16

## 2023-12-11 MED ORDER — PREDNISONE 20 MG PO TABS
60.0000 mg | ORAL_TABLET | Freq: Every day | ORAL | Status: DC
Start: 2023-12-12 — End: 2023-12-19
  Filled 2023-12-11: qty 3

## 2023-12-11 NOTE — Evaluation (Signed)
 Clinical/Bedside Swallow Evaluation Patient Details  Name: Meredith Park MRN: [id number] Date of Birth: [birth year]  Today's Date: 12/11/2023 Time: SLP Start Time (ACUTE ONLY): 0948 SLP Stop Time (ACUTE ONLY): 0958 SLP Time Calculation (min) (ACUTE ONLY): 10 min  Past Medical History:  Past Medical History:  Diagnosis Date   Alcohol abuse    Alcohol withdrawal (HCC) 08/28/2018   High cholesterol    Hypertension    Seizures (HCC)    Past Surgical History: No past surgical history on file. HPI:  53 yo female presenting s/p seizure with vomiting. Admitted with acute encephalopathy, acute respiratory failure, septic shock secondary to aspiration PNA. ETT 3/25-4/2; reintubated for stridor and secretions 4/2-4/7. MRI 4/6: diffuse cortical edema and slight increased vascularity to the  right hemisphere, possibly postictal. PMH: HTN, HLD, ETOH abuse hx, seizure. Previous SLUMS 2023: 26/30 (errors primarily with delayed recall, mild word-finding).    Assessment / Plan / Recommendation  Clinical Impression  Pt presents with signs of a post-extubation dysphagia in the setting of multiple prolonged intubations, further exacerbated by current mentation. She is nearly aphonic, with brief vocalizations noted during evaluation, mostly when attempting to cough. This is suggestive of some adduction to generate subglottic pressure but her cough overall appears to be quite weak. Pt is eager for POs and does start mastication of ice chips fairly quickly, but she seems to lose awareness of boluses on her L side, losing a little ice and water anteriorly but also starting to hold what remains in her mouth. She does not swallow to command, so no swallowing is elicited with ice chips. She initiates a swallow with thin liquids, given in small increments via spoon or siphoned via straw, with immediate coughing. Pt is likely to need an instrumental assessment but does not appear to be ready for this yet, especially with  high reintubation risk per RN. Recommend NPO for now with emphasis on oral hygiene.   SLP Visit Diagnosis: Dysphagia, unspecified (R13.10)    Aspiration Risk  Severe aspiration risk;Risk for inadequate nutrition/hydration    Diet Recommendation NPO;Alternative means - temporary    Medication Administration: Via alternative means    Other  Recommendations Oral Care Recommendations: Oral care QID Caregiver Recommendations: Have oral suction available    Recommendations for follow up therapy are one component of a multi-disciplinary discharge planning process, led by the attending physician.  Recommendations may be updated based on patient status, additional functional criteria and insurance authorization.  Follow up Recommendations Acute inpatient rehab (3hours/day)      Assistance Recommended at Discharge    Functional Status Assessment Patient has had a recent decline in their functional status and demonstrates the ability to make significant improvements in function in a reasonable and predictable amount of time.  Frequency and Duration min 2x/week  2 weeks       Prognosis Prognosis for improved oropharyngeal function: Good Barriers to Reach Goals: Cognitive deficits      Swallow Study   General HPI: 53 yo female presenting s/p seizure with vomiting. Admitted with acute encephalopathy, acute respiratory failure, septic shock secondary to aspiration PNA. ETT 3/25-4/2; reintubated for stridor and secretions 4/2-4/7. MRI 4/6: diffuse cortical edema and slight increased vascularity to the  right hemisphere, possibly postictal. PMH: HTN, HLD, ETOH abuse hx, seizure. Previous SLUMS 2023: 26/30 (errors primarily with delayed recall, mild word-finding). Type of Study: Bedside Swallow Evaluation Previous Swallow Assessment: none in chart Diet Prior to this Study: NPO;Cortrak/Small bore NG tube Temperature Spikes Noted:  Yes (101.4) Respiratory Status: Room air History of Recent  Intubation: Yes Total duration of intubation (days): 13 days (across two intubations) Date extubated: 12/10/23 Behavior/Cognition: Alert;Cooperative;Confused;Requires cueing Oral Cavity Assessment: Dry Oral Care Completed by SLP: Yes Oral Cavity - Dentition: Adequate natural dentition Self-Feeding Abilities: Total assist Patient Positioning: Upright in bed Baseline Vocal Quality: Hoarse;Low vocal intensity;Aphonic Volitional Cough: Weak Volitional Swallow: Unable to elicit    Oral/Motor/Sensory Function Overall Oral Motor/Sensory Function: Generalized oral weakness (not following commands consistently)   Ice Chips Ice chips: Impaired Presentation: Spoon Oral Phase Impairments: Poor awareness of bolus Oral Phase Functional Implications: Oral holding Pharyngeal Phase Impairments: Unable to trigger swallow   Thin Liquid Thin Liquid: Impaired Presentation: Spoon;Straw Oral Phase Impairments: Reduced labial seal Oral Phase Functional Implications: Left anterior spillage Pharyngeal  Phase Impairments: Suspected delayed Swallow;Cough - Immediate    Nectar Thick Nectar Thick Liquid: Not tested   Honey Thick Honey Thick Liquid: Not tested   Puree Puree: Not tested   Solid     Solid: Not tested      Mahala Menghini., M.A. CCC-SLP Acute Rehabilitation Services Office [phone]  Secure chat preferred  12/11/2023,12:39 PM

## 2023-12-11 NOTE — Progress Notes (Signed)
 Occupational Therapy Treatment Patient Details Name: Meredith Park MRN: [id number] DOB: 24-Mar-1971 Today's Date: 12/11/2023   History of present illness 53 yo female admitted 11/27/23 s/p seizure with acute encephalopathy, acute respiratory failure, septic shock secondary to aspiration PNA. LP 3/26, ETT 3/26-4/2, re-intubated 4/2. Extubated 4/7. PMH: HTN, HLD ETOH abuse hx, seizure   OT comments  Pt noted to have a wound in the same area in two spots with RN notified. Dressing placed over pressure wound areas. Pt tolerated EOB sitting and x1 attempt to stand from EOB. Pt demonstrates pushing with R ue and R visual attention. Recommendation updated with d/c of vent to Patient will benefit from intensive inpatient follow-up therapy, >3 hours/day   Call me angie      If plan is discharge home, recommend the following:  Two people to help with walking and/or transfers;Two people to help with bathing/dressing/bathroom   Equipment Recommendations  Wheelchair cushion (measurements OT);Wheelchair (measurements OT);Hospital bed;Hoyer lift    Recommendations for Other Services Speech consult;PT consult    Precautions / Restrictions Precautions Precautions: Fall;Other (comment) Recall of Precautions/Restrictions: Impaired Precaution/Restrictions Comments: Flexiseal, NGT, R wrist restraint Restrictions Weight Bearing Restrictions Per Provider Order: No       Mobility Bed Mobility Overal bed mobility: Needs Assistance Bed Mobility: Rolling, Sidelying to Sit, Sit to Supine Rolling: Max assist Sidelying to sit: Max assist, +2 for physical assistance Supine to sit: +2 for physical assistance, Max assist Sit to supine: Max assist, +2 for physical assistance   General bed mobility comments: Rolling towards left side of bed, maxA + 2 for BLE and truncal assist. Use of bed pad to scoot hips forward to edge    Transfers Overall transfer level: Needs assistance Equipment used: 2 person  hand held assist Transfers: Sit to/from Stand Sit to Stand: Total assist, +2 physical assistance           General transfer comment: TotalA + 2 to lift hips off edge of bed, decreased ability to bear weight through both legs     Balance Overall balance assessment: Needs assistance Sitting-balance support: Feet supported Sitting balance-Leahy Scale: Poor Sitting balance - Comments: LOB in all directions, but does have some balance reaction. MaxA. Pushing with RUE Postural control: Left lateral lean     Standing balance comment: deferred                           ADL either performed or assessed with clinical judgement   ADL                                         General ADL Comments: requires (A) for all adls at this time.    Extremity/Trunk Assessment Upper Extremity Assessment Upper Extremity Assessment: LUE deficits/detail LUE Deficits / Details: no activation noted but does have sensory displayed by R UE response to nail bed pressure. pt noted to have a clonus like movement to L UE when moved by PT and sat down on bed surface 3 beats LUE Sensation: decreased light touch LUE Coordination: decreased fine motor;decreased gross motor   Lower Extremity Assessment LLE Deficits / Details: 5 beats of clonus LLE Sensation: decreased light touch        Vision       Perception Perception Perception: Impaired Preception Impairment Details: Inattention/Neglect Perception-Other Comments: Left side   Praxis  Communication Communication Communication: Impaired (making no attempts to verbalize) Factors Affecting Communication: Trach/intubated   Cognition Arousal: Alert Behavior During Therapy: Restless Cognition: Cognition impaired, Difficult to assess Difficult to assess due to: Impaired communication, Intubated           OT - Cognition Comments: pt following 1 step commands with increased time. pt perseverating and repeating the  same thing over and over. pt with R visual attention. pt is taking hand and making her hand open in close as if inside a puppet ( mouth talking) then pointing to R ear. Pt does not confirm or deny hearing difficulty when asked. pt repeates this over and over. No visual signs of R ear canal occlusion- using flash light to inspect ear surface. Pt given pen and asked to write name and pt writes in a dark overlapping unable to read ink spot. Pt asked to draw a straight line and does this multiple times. pt asked to draw circle and square and completes these much more legible.                 Following commands: Impaired Following commands impaired: Follows one step commands inconsistently      Cueing   Cueing Techniques: Verbal cues, Tactile cues  Exercises      Shoulder Instructions       General Comments RA with VSS monitored closely    Pertinent Vitals/ Pain       Pain Assessment Faces Pain Scale: No hurt  Home Living                                          Prior Functioning/Environment              Frequency  Min 2X/week        Progress Toward Goals  OT Goals(current goals can now be found in the care plan section)  Progress towards OT goals: Progressing toward goals  Acute Rehab OT Goals Patient Stated Goal: none stated OT Goal Formulation: Patient unable to participate in goal setting Time For Goal Achievement: 12/21/23 Potential to Achieve Goals: Good  Plan      Co-evaluation    PT/OT/SLP Co-Evaluation/Treatment: Yes Reason for Co-Treatment: Complexity of the patient's impairments (multi-system involvement);Necessary to address cognition/behavior during functional activity;To address functional/ADL transfers;For patient/therapist safety   OT goals addressed during session: ADL's and self-care;Proper use of Adaptive equipment and DME;Strengthening/ROM      AM-PAC OT "6 Clicks" Daily Activity     Outcome Measure   Help from  another person eating meals?: A Lot Help from another person taking care of personal grooming?: A Lot Help from another person toileting, which includes using toliet, bedpan, or urinal?: A Lot Help from another person bathing (including washing, rinsing, drying)?: A Lot Help from another person to put on and taking off regular upper body clothing?: A Lot Help from another person to put on and taking off regular lower body clothing?: A Lot 6 Click Score: 12    End of Session Equipment Utilized During Treatment: Oxygen  OT Visit Diagnosis: Unsteadiness on feet (R26.81);Muscle weakness (generalized) (M62.81)   Activity Tolerance Patient tolerated treatment well   Patient Left in bed;with call bell/phone within reach;with bed alarm set;with family/visitor present   Nurse Communication Mobility status;Precautions        Time: 7829-5621 OT Time Calculation (min): 26 min  Charges: OT  General Charges $OT Visit: 1 Visit OT Treatments $Self Care/Home Management : 8-22 mins   Brynn, OTR/L  Acute Rehabilitation Services Office: [phone] .   Mateo Flow 12/11/2023, 10:25 PM

## 2023-12-11 NOTE — Progress Notes (Signed)
 Physical Therapy Treatment Patient Details Name: Meredith Park MRN: [id number] DOB: 08/06/1971 Today's Date: 12/11/2023   History of Present Illness 53 yo female admitted 11/27/23 s/p seizure with acute encephalopathy, acute respiratory failure, septic shock secondary to aspiration PNA. LP 3/26, ETT 3/26-4/2, re-intubated 4/2. Extubated 4/7. PMH: HTN, HLD ETOH abuse hx, seizure    PT Comments   Pt extubated 4/7. Pt alert and restless, with no attempts to verbalize and follows ~20% of one step commands with delay. No active movement on the left side. Noted breakdown on posterior scalp; RN notified and PT applied foam pads. Pt requiring two person assist to transition to edge of bed and does not initiate standing with use of pad to lift hips. Will continue to follow acutely to progress as tolerated. Based on age and PLOF, continue to recommend intensive post acute rehabilitation.   If plan is discharge home, recommend the following: Two people to help with walking and/or transfers;Two people to help with bathing/dressing/bathroom;Assistance with cooking/housework;Direct supervision/assist for medications management;Assistance with feeding;Assist for transportation;Direct supervision/assist for financial management;Help with stairs or ramp for entrance   Can travel by private vehicle        Equipment Recommendations  Other (comment) (TBA)    Recommendations for Other Services       Precautions / Restrictions Precautions Precautions: Fall;Other (comment) Recall of Precautions/Restrictions: Impaired Precaution/Restrictions Comments: Flexiseal, NGT, R wrist restraint Restrictions Weight Bearing Restrictions Per Provider Order: No     Mobility  Bed Mobility Overal bed mobility: Needs Assistance Bed Mobility: Rolling, Sidelying to Sit, Sit to Supine Rolling: Max assist Sidelying to sit: Max assist, +2 for physical assistance   Sit to supine: Max assist, +2 for physical assistance    General bed mobility comments: Rolling towards left side of bed, maxA + 2 for BLE and truncal assist. Use of bed pad to scoot hips forward to edge    Transfers Overall transfer level: Needs assistance Equipment used: 2 person hand held assist Transfers: Sit to/from Stand Sit to Stand: Total assist, +2 physical assistance           General transfer comment: TotalA + 2 to lift hips off edge of bed, decreased ability to bear weight through both legs    Ambulation/Gait               General Gait Details: unable   Stairs             Wheelchair Mobility     Tilt Bed    Modified Rankin (Stroke Patients Only) Modified Rankin (Stroke Patients Only) Pre-Morbid Rankin Score: No symptoms Modified Rankin: Severe disability     Balance Overall balance assessment: Needs assistance Sitting-balance support: Feet supported Sitting balance-Leahy Scale: Poor Sitting balance - Comments: LOB in all directions, but does have some balance reaction. MaxA. Pushing with RUE                                    Communication Communication Communication: Impaired (making no attempts to verbalize)  Cognition Arousal: Alert Behavior During Therapy: Restless   PT - Cognitive impairments: Difficult to assess Difficult to assess due to:  (nonverbal)                     PT - Cognition Comments: Pt very restless, consistently moving R hand through air, pointing at ear. Able to draw shapes i.e. circle/square, but unable to  draw letters. Poor command following Following commands: Impaired Following commands impaired: Follows one step commands inconsistently    Cueing Cueing Techniques: Verbal cues, Tactile cues  Exercises      General Comments  HR 93, SpO2 98% on RA, BP 136/82 (98)      Pertinent Vitals/Pain Pain Assessment Pain Assessment: Faces Faces Pain Scale: No hurt    Home Living                          Prior Function             PT Goals (current goals can now be found in the care plan section) Acute Rehab PT Goals Patient Stated Goal: unable to state Potential to Achieve Goals: Fair    Frequency    Min 2X/week      PT Plan      Co-evaluation PT/OT/SLP Co-Evaluation/Treatment: Yes Reason for Co-Treatment: Complexity of the patient's impairments (multi-system involvement);Necessary to address cognition/behavior during functional activity;To address functional/ADL transfers;For patient/therapist safety PT goals addressed during session: Mobility/safety with mobility        AM-PAC PT "6 Clicks" Mobility   Outcome Measure  Help needed turning from your back to your side while in a flat bed without using bedrails?: Total Help needed moving from lying on your back to sitting on the side of a flat bed without using bedrails?: Total Help needed moving to and from a bed to a chair (including a wheelchair)?: Total Help needed standing up from a chair using your arms (e.g., wheelchair or bedside chair)?: Total Help needed to walk in hospital room?: Total Help needed climbing 3-5 steps with a railing? : Total 6 Click Score: 6    End of Session Equipment Utilized During Treatment: Gait belt Activity Tolerance: Patient tolerated treatment well Patient left: in bed;with call bell/phone within reach;with bed alarm set;with restraints reapplied Nurse Communication: Mobility status PT Visit Diagnosis: Muscle weakness (generalized) (M62.81);Difficulty in walking, not elsewhere classified (R26.2);Other symptoms and signs involving the nervous system (R29.898)     Time: 1610-9604 PT Time Calculation (min) (ACUTE ONLY): 29 min  Charges:    $Therapeutic Activity: 8-22 mins PT General Charges $$ ACUTE PT VISIT: 1 Visit                     Lillia Pauls, PT, DPT Acute Rehabilitation Services Office [phone]    Norval Morton 12/11/2023, 4:17 PM

## 2023-12-11 NOTE — Progress Notes (Signed)
 Subjective: Tmax 101.29F.  Extubated   ROS: Unable to obtain due to poor mental status  Examination  Vital signs in last 24 hours: Temp:  [98.9 F (37.2 C)-101.4 F (38.6 C)] 98.9 F (37.2 C) (04/08 0800) Pulse Rate:  [77-121] 121 (04/08 0900) Resp:  [14-33] 22 (04/08 0900) BP: (126-165)/(82-111) 151/101 (04/08 0900) SpO2:  [96 %-99 %] 97 % (04/08 0900) Weight:  [56.6 kg] 56.6 kg (04/08 0500)  General: lying in bed, NAD Neuro: Awake, alert, right gaze preference (left-sided neglect), barely told me her name, did follow commands, antigravity strength in right upper and right lower extremity, withdraws to noxious stimuli left upper and left lower extremity  Basic Metabolic Panel: Recent Labs  Lab 12/07/23 0612 12/08/23 0931 12/08/23 1923 12/09/23 0312 12/09/23 0932 12/10/23 0444 12/11/23 0530  NA 145 143 145 145  144 145 147* 151*  K 3.8 3.3*  --  3.3*  --  3.5 3.4*  CL 109 105  --  109  --  113* 119*  CO2 23 24  --  23  --  22 19*  GLUCOSE 190* 163*  --  160*  --  220* 225*  BUN 67* 60*  --  62*  --  68* 69*  CREATININE 1.60* 1.55*  --  1.48*  --  1.53* 1.42*  CALCIUM 8.4* 8.7*  --  8.5*  --  8.6* 9.2  MG 2.2  --   --   --   --   --  2.9*    CBC: Recent Labs  Lab 12/05/23 0945 12/05/23 2104 12/07/23 0829 12/08/23 0931 12/10/23 0444 12/11/23 0530  WBC 7.3  --  14.2* 11.0* 11.6* 14.7*  HGB 9.4* 8.8* 9.1* 10.2* 9.2* 9.9*  HCT 30.2* 26.0* 28.6* 31.8* 29.9* 31.5*  MCV 87.3  --  86.4 85.7 89.5 88.7  PLT 124*  --  317 337 458* 519*     Coagulation Studies: No results for input(s): "LABPROT", "INR" in the last 72 hours.  Imaging No new brain imaging overnight  ASSESSMENT AND PLAN: 53 y.o. female past medical history of alcohol and substance abuse, hypertension, seizures was found wedged between bed and nightstand and seizing for unknown duration.  Brought in after Versed in the field with resolution of clinical seizure activity.  Obtunded mentation after  significant passage of time prompted further workup. MRI brain with cortical changes in the right hemisphere and crossed cerebellar diaschisis, concerning more for a postictal phenomenology. EEG with LPD's likely ictal in nature.  CT angiogram was done-no emergent occlusion. CSF studies bland with no pleocytosis, mildly elevated protein, negative meningitis/encephalitis panel, no organisms on CSF gram stain and no growth on CSF cultures x 5 days. Exam remains poor despite resolution of ictal activity on the EEG with AED adjustments.  Repeat head CT done that shows extensive right hemispheric edema more than expected from postictal from neurology. Repeat MRI brain shows progression of earlier noted unihemispheric edema of the right cerebral hemisphere, which now includes most of the white matter.     Status epilepticus convulsive, resolved Acute encephalopathy due to seizure Cardiomyopathy with reduced EF -- likely secondary to stress Acute respiratory failure with hypoxia  Fever - Etiology of her cortical unilateral brain edema remains unclear, still suspect this is post ictal but with progression of edema, will expand differential to evaluate for potential autoimmune/paraneoplastic/vasculitic pathology and start empiric solumedrol  - Serum Autoimmune encephalopathy panel, serum paraneoplastic encephalopathy panel, Anti TPO thyroglobulin antibodies. - continue Topamax 100mg   daily, Lamotrigine 250mg  BID, Vimpat 50mg  BID. - Completed solumedrol 1000mg  daily D3/3 empirically due to concern for potential autoimmune/paraneoplastic pathology.  Currently on prednisone taper at 60 mg with plan to decrease by 10 mg every week -Management of fever per primary team - Neurology to continue to follow   I have spent a total of  35 minutes with the patient reviewing hospital notes,  test results, labs and examining the patient as well as establishing an assessment and plan. > 50% of time was spent in direct patient  care.        Lindie Spruce Epilepsy [facility] For questions after 5pm please refer to AMION to reach the Neurologist on call

## 2023-12-11 NOTE — TOC Progression Note (Signed)
 Transition of Care [facility]) - Progression Note    Patient Details  Name: Meredith Park MRN: [id number] Date of Birth: 14-Feb-1971  Transition of Care [facility]) CM/SW Contact  Kermit Balo, RN Phone Number: 12/11/2023, 1:15 PM  Clinical Narrative:     Pt is off Vent. Waiting to see if she can tolerate CIR. TOC following.       Expected Discharge Plan and Services                                               Social Determinants of Health (SDOH) Interventions SDOH Screenings   Food Insecurity: Low Risk  (07/18/2023)   Received from [facility]  Housing: Low Risk  (07/18/2023)   Received from [facility]  Transportation Needs: No Transportation Needs (07/18/2023)   Received from [facility]  Utilities: Low Risk  (07/18/2023)   Received from [facility]  Tobacco Use: Medium Risk (11/05/2023)   Received from [facility]    Readmission Risk Interventions     No data to display

## 2023-12-11 NOTE — Evaluation (Signed)
 Speech Language Pathology Evaluation Patient Details Name: Enyah Moman MRN: [id number] DOB: May 07, 1971 Today's Date: 12/11/2023 Time: 4098-1191 SLP Time Calculation (min) (ACUTE ONLY): 10 min  Problem List:  Patient Active Problem List   Diagnosis Date Noted   Protein-calorie malnutrition, severe 12/07/2023   Altered mental status 12/07/2023   Non-ST elevation (NSTEMI) myocardial infarction (HCC) 11/29/2023   Acute systolic heart failure (HCC) 11/29/2023   AKI (acute kidney injury) (HCC) 11/29/2023   Acute hypoxemic respiratory failure (HCC) 11/29/2023   Shock liver 11/29/2023   Elevated liver enzymes 11/29/2023   Shock (HCC) 11/28/2023   Status epilepticus (HCC) 11/27/2023   Alcohol abuse 11/07/2022   QT prolongation 11/07/2022   Leukocytosis 11/07/2022   Hyponatremia 11/07/2022   Rhabdomyolysis 11/07/2022   High anion gap metabolic acidosis 11/07/2022   Hypokalemia 09/30/2021   Cannabis abuse-Urine drug screen +ve on 1/26 09/30/2021   Breakthrough seizure (HCC) 09/29/2021   Alcohol dependence in remission (HCC) 04/12/2018   Adjustment disorder with mixed anxiety and depressed mood 08/26/2017   Essential (primary) hypertension 08/26/2017   Mixed hyperlipidemia 08/26/2017   Past Medical History:  Past Medical History:  Diagnosis Date   Alcohol abuse    Alcohol withdrawal (HCC) 08/28/2018   High cholesterol    Hypertension    Seizures (HCC)    Past Surgical History: No past surgical history on file. HPI:  53 yo female presenting s/p seizure with vomiting. Admitted with acute encephalopathy, acute respiratory failure, septic shock secondary to aspiration PNA. ETT 3/25-4/2; reintubated for stridor and secretions 4/2-4/7. MRI 4/6: diffuse cortical edema and slight increased vascularity to the  right hemisphere, possibly postictal. PMH: HTN, HLD, ETOH abuse hx, seizure. Previous SLUMS 2023: 26/30 (errors primarily with delayed recall, mild word-finding).   Assessment /  Plan / Recommendation Clinical Impression  Pt's intelligibility of speech is quite limited even at the word level, impacted by largely by dysphonia and breath support but also likely articulation. She replies a lot with gestures, and is 100% accurate with basic Y/N questions when given a little extra processing time. Her accuracy drastically decreases as questions become mildly more challenging, as pt either responds by nodding "yes" or doesn't respond at all. She is inconsistent with command following even within functional tasks, and tries to pull at lines. She would benefit from ongoing SLP f/u acutely and at next level of care.      SLP Assessment  SLP Recommendation/Assessment: Patient needs continued Speech Lanaguage Pathology Services SLP Visit Diagnosis: Cognitive communication deficit (R41.841)    Recommendations for follow up therapy are one component of a multi-disciplinary discharge planning process, led by the attending physician.  Recommendations may be updated based on patient status, additional functional criteria and insurance authorization.    Follow Up Recommendations  Acute inpatient rehab (3hours/day)    Assistance Recommended at Discharge  Frequent or constant Supervision/Assistance  Functional Status Assessment Patient has had a recent decline in their functional status and demonstrates the ability to make significant improvements in function in a reasonable and predictable amount of time.  Frequency and Duration min 2x/week  2 weeks      SLP Evaluation Cognition  Overall Cognitive Status: Impaired/Different from baseline Arousal/Alertness: Awake/alert Orientation Level: Oriented to person Attention: Sustained Sustained Attention: Impaired Sustained Attention Impairment: Functional basic;Verbal basic Awareness: Impaired Awareness Impairment: Other (comment) (online awareness) Problem Solving: Impaired Problem Solving Impairment: Functional  basic Safety/Judgment: Impaired       Comprehension  Auditory Comprehension Overall Auditory Comprehension: Impaired Yes/No  Questions: Impaired Basic Biographical Questions: 76-100% accurate Basic Immediate Environment Questions: 75-100% accurate Complex Questions: 0-24% accurate Commands: Impaired One Step Basic Commands: 50-74% accurate    Expression Expression Primary Mode of Expression: Verbal Verbal Expression Overall Verbal Expression: Other (comment) (needs ongoing assessment as intelligibility improves)   Oral / Motor  Oral Motor/Sensory Function Overall Oral Motor/Sensory Function:  (not following commands consistently for assessment) Motor Speech Overall Motor Speech: Impaired Respiration: Impaired Level of Impairment: Word Phonation: Aphonic;Hoarse;Low vocal intensity Articulation: Impaired Level of Impairment: Word Intelligibility: Intelligibility reduced Word: 0-24% accurate Interfering Components:  (prolonged intubation x2)            Mahala Menghini., M.A. CCC-SLP Acute Rehabilitation Services Office [phone]  Secure chat preferred  12/11/2023, 12:49 PM

## 2023-12-11 NOTE — Progress Notes (Signed)
 Lower extremity venous duplex completed. Please see CV Procedures for preliminary results.  Shona Simpson, RVT 12/11/23 11:43 AM

## 2023-12-11 NOTE — Progress Notes (Signed)
 Nutrition Follow-up  DOCUMENTATION CODES:   Severe malnutrition in context of social or environmental circumstances  INTERVENTION:   Continue tube feeding via Cortrak: Osmolite 1.5 at 50 ml/h (1200 ml per day) Prosource TF20 60 ml  BID 100 ml FWF Q4H    Provides 1960 kcal, 115 gm protein, 914 ml free water daily (1514 ml water daily TF + FWF)   Add Banatrol 60 ml BID Continue MVI, folic acid, Thiamine daily  Monitor diet advancement and adjust tube feeds as necessary    NUTRITION DIAGNOSIS:   Severe Malnutrition related to social / environmental circumstances as evidenced by mild fat depletion, mild muscle depletion, moderate muscle depletion, severe muscle depletion, percent weight loss.  - Still applicable   GOAL:   Patient will meet greater than or equal to 90% of their needs  - Meeting via TF  MONITOR:   TF tolerance, I & O's, Vent status, Labs  REASON FOR ASSESSMENT:   Ventilator    ASSESSMENT:   Pt with hx of alcohol abuse, drug abuse, HTN, and HLD presented to ED after being found down at home with seizure-like activity.  3/25 - admitted; intubated 3/27 - increased pressor requirement worsening MOD 3/28 - s/p cortrak placement (gastric) 3/29 - improved pressor requirements 4/2 - extubated; re-intubated due to stridor and secretions 4/7 - Extubated, NPO    Patient extubated yesterday, per SLP experiencing post extubation dysphagia with poor cognition, keep NPO. Tolerating TF at goal.   On visit patient was able to open her eyes briefly then fall back asleep. On Precedex for agitation. Per RN having liquid stools via rectal pouch. Having ongoing fevers, CBG's high due to steroids, MD adding TF coverage and increased SSI. Hypernatremia, MD adding FWF.    Patient is currently intubated on ventilator support Temp (24hrs), Avg:99.9 F (37.7 C), Min:98.9 F (37.2 C), Max:100.8 F (38.2 C)  MAP (cuff): 99 mmHg   Admit weight: 63 kg  Current weight: 56.6  kg    Intake/Output Summary (Last 24 hours) at 12/11/2023 1422 Last data filed at 12/11/2023 1030 Gross per 24 hour  Intake 1212.33 ml  Output 1800 ml  Net -587.67 ml   Net IO Since Admission: 3,264.69 mL [12/11/23 1422]  Drains/Lines: Rectal tube 300 ml today UOP: 4025 ml x 24 hours   Nutritionally Relevant Medications: Scheduled Meds:  docusate  100 mg Per Tube BID   feeding supplement (PROSource TF20)  60 mL Per Tube BID   folic acid  1 mg Per Tube Daily   free water  100 mL Per Tube Q4H   insulin aspart  0-15 Units Subcutaneous Q4H   insulin aspart  3 Units Subcutaneous Q4H   multivitamin with minerals  1 tablet Per Tube Daily   thiamine (VITAMIN B1) injection  100 mg Intravenous Daily   topiramate  100 mg Per Tube QHS   Continuous Infusions:  dexmedetomidine (PRECEDEX) IV infusion 0.7 mcg/kg/hr (12/11/23 0753)   feeding supplement (OSMOLITE 1.5 CAL) 1,000 mL (12/11/23 0921)   lacosamide (VIMPAT) IV 50 mg (12/11/23 0954)   Labs Reviewed: Sodium 151, Potassium 3.4, Chloride 119, BUN 69, Creatinine 1.42, Magnesium 2.9, AST 82, ALT 106, total protein 6, GFR 45, HDL 37, TG 171,  CBG ranges from 125-233 mg/dL over the last 24 hours HgbA1c 5  Diet Order:   Diet Order             Diet NPO time specified  Diet effective now  EDUCATION NEEDS:   Not appropriate for education at this time  Skin:  Skin Assessment: Reviewed RN Assessment  Last BM:  no intake recorded 4/7. 300 ml so far 4/8, type 7  Height:   Ht Readings from Last 1 Encounters:  12/05/23 5' 2.01" (1.575 m)    Weight:   Wt Readings from Last 1 Encounters:  12/11/23 56.6 kg    Ideal Body Weight:  50 kg  BMI:  Body mass index is 22.82 kg/m.  Estimated Nutritional Needs:   Kcal:  1800-2000 kcal/d  Protein:  100-120 grams  Fluid:  1.8-2L/d   Elliot Dally, RD Registered Dietitian  See Amion for more information

## 2023-12-11 NOTE — Progress Notes (Addendum)
 NAME:  Amyah Clawson, MRN:  [id number], DOB:  09/26/1970, LOS: 14 ADMISSION DATE:  11/27/2023, CONSULTATION DATE: 3/25 REFERRING MD: Dr. Charm Barges EDP, CHIEF COMPLAINT: Seizure  History of Present Illness:  53 year old female with past medical history as below, which is significant for seizure disorder followed at [facility] neurology, alcohol abuse reportedly sober for 1 year, hypertension, marijuana use, and hyperlipidemia.  Seizures are managed with Lamictal and recently added topamax with better control of seizures.  Last seen by neurology 07/19/2023.  Family reports she did have a mixup with her seizure medications a week prior to arrival.  She ran out of one of her medications and did not realize it.  This has been remedied and she is back taking both medications.  The patient was in her usual state of health until 3/25 when she was found down beside her bed by her family at approximately 7 PM.  Last known well around 2:30 PM.  Her mother found her and noticed her leg shaking and she was unresponsive.  EMS was called upon EMS arrival she was noted to have continuous full body seizure as well as vomiting.  She was treated with 10 mg of IM Versed with cessation of seizures.  She remained minimally responsive in the emergency department.  She was hypoxic requiring 10 L of oxygen.  Chest x-ray was unremarkable.  CT of the head and C-spine were unremarkable for acute issues with the exception of findings associated with chronic paranasal sinusitis.  Due to poor responsiveness PCCM was asked to evaluate for ICU admission.  Pertinent  Medical History   has a past medical history of Alcohol abuse, Alcohol withdrawal (HCC) (08/28/2018), High cholesterol, Hypertension, and Seizures (HCC).   Significant Hospital Events: Including procedures, antibiotic start and stop dates in addition to other pertinent events   3/25 admit to ICU. Intubated 3/26 LP performed. Increasing pressor requirement 3/27 Increased  vasopressor requirement with worsening MOD including AKI, shock liver. Echo with stress CM. 3/29 Improved vasopressor requirements 4/2 central line d/c, extubated (had cuff leak), reintubated for stridor & secretions several hours later 4/5 CTH w/progression of unihemispheric edema, thought more c/w seizure changes 4/6 LTM, steroids, 4/7 extubated, intermittently on predex for agitation, LTM d/c'd, tmax 101.6/ WBC stable  Interim History / Subjective:  Remains on dex 0.7.  Pt without complaints of pain or anxiety  Objective   Blood pressure (!) 151/101, pulse (!) 121, temperature 98.9 F (37.2 C), temperature source Axillary, resp. rate (!) 22, height 5' 2.01" (1.575 m), weight 56.6 kg, last menstrual period 08/28/2020, SpO2 97%.    Vent Mode: PSV;CPAP FiO2 (%):  [30 %] 30 % PEEP:  [5 cmH20] 5 cmH20 Pressure Support:  [5 cmH20] 5 cmH20   Intake/Output Summary (Last 24 hours) at 12/11/2023 7829 Last data filed at 12/11/2023 0900 Gross per 24 hour  Intake 1462.54 ml  Output 1200 ml  Net 262.54 ml   Filed Weights   12/09/23 0315 12/10/23 0500 12/11/23 0500  Weight: 63.3 kg 61.4 kg 56.6 kg   Physical Exam: Dex 0.7 General:  Adult female sitting upright in bed in NAD HEENT: MM pink/dry, cortrak, pupils 3/r, anicteric, no stridor Neuro: Awake, purposeful on R, attempts to say name, soft/but harsh phonation, f/c, tracks but can not cross left of midline, left neglect, 5/5 RUE/ RLE, LUE contracture, flaccid LLE CV: rr, ST, no murmur PULM:  non labored, slightly coarse, few scattered rhonchi, strong cough, RA GI: soft, bs+, NT, purwick, FMS Extremities:  warm/dry, no edema  Skin: no rashes   Tmax 100.8> decreasing fever curve CBG trend > 200- 233 Labs reviewed> Na 147> 151, K 3.4, Mag 2.9, CL 119, bicarb 19, stable BUN/ sCr, improving LFTs, PCT yest 0.54, WBC 11.6> 14.7, H/H stable, plts 459> 519 UOP 900 ml/ 24hrs (down from 4L) Net +4.1 Stool 300 ml/ 24hrs   Resolved Hospital  Problem list   Rhabdomyolysis Thrombocytopenia Septic shock secondary to aspiration pneumonia, resolved  Assessment & Plan:   Status epilepticus: Home lamictal, topamax however recently off due to mix-up. CTA neg for LVO. CT spin no acute fx.  Unclear how long pt was seizing prior to be found down Unihemispheric edema of right cerebral hemisphere unclear etiology, possible 2/2 post ictal state Acute encephalopathy 2/2 above with agitation, improving: MR Brain with findings c/w prolonged status epilepticus Prior hx of ETOH abuse, reportedly sober x 1 yr - of LTM 4/7.  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  anti-TIP thyroglobulin ab neg - seizure precautions w/ prn ativan for clinical seizures - serial neuro exams - wean precedex as able> clonidine taper - thiamine - continue hemodynamic support, PT/ OT/ SLP - did have rx klonopin for 30 days in March 7 but otherwise no obvious fill hx on pharmacy review for chronic benzo use  Acute respiratory failure with hypoxia requiring mechanical ventilation, resolved Aspiration pneumonia, culture shows normal flora Post-extubation stridor, delayed; reintubated with 7.5 ETT 3/26, resolved - extubated 4/7 and doing well.  No evidence of stridor or difficulty w/secretions.  Currently on RA - SLP effort.  Remains otherwise NPO, EN/ meds via cortrak - aspiration precautions - pulm hygiene - completed abx 4/7 - mother agreeable to trach if repeated resp failure  SIRS P:  - progressive more hypertensive and tachycardia, suspect 2/2 to agitation.  Remains on low dose dex.  Will start clonidine taper to wean off - fever  curve better, WBC up but could be related to steroids.  Ongoing fevers remain unclear- if 2/2 to CNS process, ?drug fever (try to get off precedex).  Cx neg to date.  CT c/a/p without evidence of infectious process.  Pending repeat LE dopplers.  Neuro questions given prior hx of ETOH abuse, could be development of gout like symptoms if no other source identified.  Will check uric acid.    New onset cardiomyopathy with EF 40-45% and new LV WMA, suspect stress- induced Troponin elevation-- favor due to seizures, but no ischemia evaluation yet. Unlikely NSTEMI/ ACS. Echo newly depressed EF 40-45% with LV WMA, grade I DD, small pericardial effusion without tamponade History of hypertension - Appreciate Cardiology input. Not a candidate for cath due to critical illness. Likely stress induced CM P:  - some question of afib/ irregular HR> tele reviewed> PAC's, few PVC.  Optimize electrolytes - adding clonidine taper as above to wean off precedex, after taper will need to reassess BP meds (home coreg, losartan) - eventually needs repeat echo when more stable to determine if she has recovered or required GDMT - diureses deferred today, euvolemic on exam, assess daily  AKI 2/2 rhabdo, ATN, CIN, improving Metabolic acidosis Hypernatremia Mild NAGMA/ hyperchloremic -  decreased UOP overnight after foley removed and stopping lasix.  Renal function stable.  Bladder scan now and prn - continue to hold diureses - add FWF - trend renal indices  - strict I/Os, daily wts - avoid nephrotoxins, renal dose meds, hemodynamic support as above  Hypokalemia - replete today.  Mag ok.  Trend on labs  Anemia of critical illness - stable, no evidence of bleeding.   - trend CBC  Transaminitis - LFT's down trending.  Trend periodically   Hyperglycemia due to steroids - add TF coverage and increased prn SSI to moderate -  Will need to adjust coverage once off steroids - goal BG 140-180  Best Practice (right  click and "Reselect all SmartList Selections" daily)   Diet/type: tubefeeds DVT prophylaxis prophylactic heparin    Pressure ulcer(s): N/A GI prophylaxis: PPI while on steroids Lines: RUE PICC>  Foley:  N/A Code Status:  full code Last date of multidisciplinary goals of care discussion [ ongoing]  Mother updated 4/7  Pending 4/8  Critical care time: 30 mins     Posey Boyer, MSN, AG-ACNP-BC [facility] 12/11/2023, 9:37 AM  See Amion for pager If no response to pager , please call [phone] until 7pm After 7:00 pm call Elink  [phone]

## 2023-12-11 NOTE — Progress Notes (Signed)
 PT Cancellation Note  Patient Details Name: Meredith Park MRN: [id number] DOB: [birth year]   Cancelled Treatment:    Reason Eval/Treat Not Completed: Other (comment) (SLP in room, ECHO in room ruling out DVT will await results).   Lillia Pauls, PT, DPT Acute Rehabilitation Services Office [phone]'   Norval Morton 12/11/2023, 12:00 PM

## 2023-12-11 NOTE — Plan of Care (Signed)
  Problem: Nutrition: Goal: Adequate nutrition will be maintained Outcome: Progressing   Problem: Elimination: Goal: Will not experience complications related to bowel motility Outcome: Progressing   Problem: Skin Integrity: Goal: Risk for impaired skin integrity will decrease Outcome: Progressing   

## 2023-12-11 NOTE — Progress Notes (Signed)
 OT Cancellation Note  Patient Details Name: Meredith Park MRN: [id number] DOB: 12-Mar-1971   Cancelled Treatment:    Reason Eval/Treat Not Completed: Patient at procedure or test/ unavailable (SLP in room, ECHO in room ruling out DVT will await results). OT to check back as appropriate  Mateo Flow 12/11/2023, 11:37 AM

## 2023-12-11 NOTE — Progress Notes (Signed)
  Inpatient Rehab Admissions Coordinator :  Per therapy recommendations, patient was screened for CIR candidacy by Ottie Glazier RN MSN.  At this time patient appears to be a potential candidate for CIR. I will place a rehab consult per protocol for full assessment. Please call me with any questions.  Ottie Glazier RN MSN Admissions Coordinator [phone]

## 2023-12-12 ENCOUNTER — Other Ambulatory Visit (HOSPITAL_COMMUNITY)

## 2023-12-12 ENCOUNTER — Encounter (HOSPITAL_COMMUNITY)

## 2023-12-12 ENCOUNTER — Encounter (HOSPITAL_COMMUNITY): Payer: Self-pay

## 2023-12-12 DIAGNOSIS — R569 Unspecified convulsions: Secondary | ICD-10-CM | POA: Diagnosis not present

## 2023-12-12 DIAGNOSIS — J9601 Acute respiratory failure with hypoxia: Secondary | ICD-10-CM | POA: Diagnosis not present

## 2023-12-12 DIAGNOSIS — G936 Cerebral edema: Secondary | ICD-10-CM | POA: Diagnosis not present

## 2023-12-12 DIAGNOSIS — G40901 Epilepsy, unspecified, not intractable, with status epilepticus: Secondary | ICD-10-CM | POA: Diagnosis not present

## 2023-12-12 DIAGNOSIS — G9340 Encephalopathy, unspecified: Secondary | ICD-10-CM | POA: Diagnosis not present

## 2023-12-12 LAB — CBC
HCT: 31.1 % — ABNORMAL LOW (ref 36.0–46.0)
Hemoglobin: 9.5 g/dL — ABNORMAL LOW (ref 12.0–15.0)
MCH: 27.5 pg (ref 26.0–34.0)
MCHC: 30.5 g/dL (ref 30.0–36.0)
MCV: 89.9 fL (ref 80.0–100.0)
Platelets: 522 10*3/uL — ABNORMAL HIGH (ref 150–400)
RBC: 3.46 MIL/uL — ABNORMAL LOW (ref 3.87–5.11)
RDW: 15.9 % — ABNORMAL HIGH (ref 11.5–15.5)
WBC: 13.1 10*3/uL — ABNORMAL HIGH (ref 4.0–10.5)
nRBC: 0 % (ref 0.0–0.2)

## 2023-12-12 LAB — GLUCOSE, CAPILLARY
Glucose-Capillary: 104 mg/dL — ABNORMAL HIGH (ref 70–99)
Glucose-Capillary: 140 mg/dL — ABNORMAL HIGH (ref 70–99)
Glucose-Capillary: 158 mg/dL — ABNORMAL HIGH (ref 70–99)
Glucose-Capillary: 168 mg/dL — ABNORMAL HIGH (ref 70–99)
Glucose-Capillary: 169 mg/dL — ABNORMAL HIGH (ref 70–99)
Glucose-Capillary: 170 mg/dL — ABNORMAL HIGH (ref 70–99)

## 2023-12-12 LAB — BASIC METABOLIC PANEL WITH GFR
Anion gap: 13 (ref 5–15)
BUN: 75 mg/dL — ABNORMAL HIGH (ref 6–20)
CO2: 17 mmol/L — ABNORMAL LOW (ref 22–32)
Calcium: 9 mg/dL (ref 8.9–10.3)
Chloride: 124 mmol/L — ABNORMAL HIGH (ref 98–111)
Creatinine, Ser: 1.17 mg/dL — ABNORMAL HIGH (ref 0.44–1.00)
GFR, Estimated: 56 mL/min — ABNORMAL LOW (ref >60)
Glucose, Bld: 208 mg/dL — ABNORMAL HIGH (ref 70–99)
Potassium: 3.3 mmol/L — ABNORMAL LOW (ref 3.5–5.1)
Sodium: 154 mmol/L — ABNORMAL HIGH (ref 135–145)

## 2023-12-12 LAB — MISC LABCORP TEST (SEND OUT): Labcorp test code: 9985

## 2023-12-12 LAB — PROCALCITONIN: Procalcitonin: 0.29 ng/mL

## 2023-12-12 LAB — MAGNESIUM: Magnesium: 2.8 mg/dL — ABNORMAL HIGH (ref 1.7–2.4)

## 2023-12-12 LAB — PHOSPHORUS: Phosphorus: 4 mg/dL (ref 2.5–4.6)

## 2023-12-12 MED ORDER — PREDNISONE 5 MG PO TABS: 30.0000 mg | ORAL_TABLET | Freq: Every day | ORAL | Status: DC

## 2023-12-12 MED ORDER — QUETIAPINE FUMARATE 50 MG PO TABS
50.0000 mg | ORAL_TABLET | Freq: Every day | ORAL | Status: DC
  Administered 2023-12-12 – 2024-01-01 (×21): 50 mg
  Filled 2023-12-12 (×14): qty 1
  Filled 2023-12-12: qty 2
  Filled 2023-12-12 (×3): qty 1
  Filled 2023-12-12: qty 2
  Filled 2023-12-12 (×2): qty 1

## 2023-12-12 MED ORDER — CLONIDINE HCL 0.1 MG PO TABS
0.2000 mg | ORAL_TABLET | Freq: Four times a day (QID) | ORAL | Status: AC
Start: 2023-12-12 — End: 2023-12-12
  Administered 2023-12-12 (×3): 0.2 mg
  Filled 2023-12-12 (×2): qty 2
  Filled 2023-12-12: qty 1

## 2023-12-12 MED ORDER — PREDNISONE 20 MG PO TABS: 10.0000 mg | ORAL_TABLET | Freq: Every day | ORAL | Status: DC

## 2023-12-12 MED ORDER — INSULIN ASPART 100 UNIT/ML IJ SOLN
4.0000 [IU] | INTRAMUSCULAR | Status: DC
  Administered 2023-12-12: 4 [IU] via SUBCUTANEOUS

## 2023-12-12 MED ORDER — PREDNISONE 20 MG PO TABS: 20.0000 mg | ORAL_TABLET | Freq: Every day | ORAL | Status: DC

## 2023-12-12 MED ORDER — LOSARTAN POTASSIUM 50 MG PO TABS
25.0000 mg | ORAL_TABLET | Freq: Two times a day (BID) | ORAL | Status: DC
Start: 2023-12-12 — End: 2023-12-15
  Administered 2023-12-12 – 2023-12-15 (×7): 25 mg
  Filled 2023-12-12 (×7): qty 1

## 2023-12-12 MED ORDER — THIAMINE MONONITRATE 100 MG PO TABS
100.0000 mg | ORAL_TABLET | Freq: Every day | ORAL | Status: DC
Start: 2023-12-12 — End: 2023-12-31
  Administered 2023-12-12 – 2023-12-31 (×19): 100 mg
  Filled 2023-12-12 (×19): qty 1

## 2023-12-12 MED ORDER — METHOCARBAMOL 500 MG PO TABS
1000.0000 mg | ORAL_TABLET | Freq: Three times a day (TID) | ORAL | Status: AC
  Administered 2023-12-12 (×2): 1000 mg
  Filled 2023-12-12 (×3): qty 2

## 2023-12-12 MED ORDER — POTASSIUM CHLORIDE 20 MEQ PO PACK
40.0000 meq | PACK | ORAL | Status: AC
  Administered 2023-12-12 (×2): 40 meq
  Filled 2023-12-12 (×2): qty 2

## 2023-12-12 MED ORDER — FREE WATER
200.0000 mL | Status: DC
  Administered 2023-12-12: 200 mL

## 2023-12-12 MED ORDER — PREDNISONE 20 MG PO TABS
60.0000 mg | ORAL_TABLET | Freq: Every day | ORAL | Status: AC
  Administered 2023-12-12 – 2023-12-17 (×6): 60 mg
  Filled 2023-12-12 (×5): qty 3

## 2023-12-12 MED ORDER — PREDNISONE 20 MG PO TABS
40.0000 mg | ORAL_TABLET | Freq: Every day | ORAL | Status: DC
  Administered 2023-12-26 – 2023-12-27 (×2): 40 mg via ORAL
  Filled 2023-12-12 (×3): qty 2

## 2023-12-12 MED ORDER — PREDNISONE 5 MG PO TABS: 10.0000 mg | ORAL_TABLET | Freq: Every day | ORAL | Status: DC

## 2023-12-12 MED ORDER — INSULIN ASPART 100 UNIT/ML IJ SOLN
0.0000 [IU] | INTRAMUSCULAR | Status: DC
  Administered 2023-12-12: 1 [IU] via SUBCUTANEOUS
  Administered 2023-12-12 – 2023-12-13 (×4): 2 [IU] via SUBCUTANEOUS
  Administered 2023-12-13 (×3): 1 [IU] via SUBCUTANEOUS
  Administered 2023-12-14: 2 [IU] via SUBCUTANEOUS
  Administered 2023-12-14 – 2023-12-15 (×3): 1 [IU] via SUBCUTANEOUS
  Administered 2023-12-15: 2 [IU] via SUBCUTANEOUS
  Administered 2023-12-15 – 2023-12-16 (×6): 1 [IU] via SUBCUTANEOUS
  Administered 2023-12-17: 2 [IU] via SUBCUTANEOUS
  Administered 2023-12-17: 1 [IU] via SUBCUTANEOUS
  Administered 2023-12-17: 2 [IU] via SUBCUTANEOUS
  Administered 2023-12-17 – 2023-12-18 (×3): 1 [IU] via SUBCUTANEOUS
  Administered 2023-12-18 (×3): 2 [IU] via SUBCUTANEOUS
  Administered 2023-12-19: 1 [IU] via SUBCUTANEOUS
  Administered 2023-12-19: 2 [IU] via SUBCUTANEOUS
  Administered 2023-12-19: 1 [IU] via SUBCUTANEOUS
  Administered 2023-12-19: 2 [IU] via SUBCUTANEOUS
  Administered 2023-12-20 (×2): 1 [IU] via SUBCUTANEOUS
  Administered 2023-12-20: 2 [IU] via SUBCUTANEOUS
  Administered 2023-12-21 – 2023-12-22 (×4): 1 [IU] via SUBCUTANEOUS
  Administered 2023-12-22 – 2023-12-23 (×3): 2 [IU] via SUBCUTANEOUS
  Administered 2023-12-23: 1 [IU] via SUBCUTANEOUS
  Administered 2023-12-23 – 2023-12-26 (×3): 2 [IU] via SUBCUTANEOUS
  Administered 2023-12-27: 1 [IU] via SUBCUTANEOUS
  Administered 2023-12-27: 2 [IU] via SUBCUTANEOUS
  Administered 2023-12-27: 1 [IU] via SUBCUTANEOUS
  Administered 2023-12-27: 3 [IU] via SUBCUTANEOUS
  Administered 2023-12-27 (×2): 2 [IU] via SUBCUTANEOUS
  Administered 2023-12-28 (×2): 1 [IU] via SUBCUTANEOUS
  Administered 2023-12-28: 2 [IU] via SUBCUTANEOUS
  Administered 2023-12-28 (×2): 1 [IU] via SUBCUTANEOUS
  Administered 2023-12-28: 2 [IU] via SUBCUTANEOUS
  Administered 2023-12-29 (×2): 1 [IU] via SUBCUTANEOUS
  Administered 2023-12-30 – 2023-12-31 (×2): 2 [IU] via SUBCUTANEOUS
  Administered 2024-01-01: 1 [IU] via SUBCUTANEOUS
  Administered 2024-01-01: 2 [IU] via SUBCUTANEOUS
  Administered 2024-01-01 – 2024-01-02 (×4): 1 [IU] via SUBCUTANEOUS

## 2023-12-12 MED ORDER — PREDNISONE 5 MG PO TABS
50.0000 mg | ORAL_TABLET | Freq: Every day | ORAL | Status: AC
  Administered 2023-12-18 – 2023-12-23 (×6): 50 mg via ORAL
  Filled 2023-12-12 (×6): qty 2

## 2023-12-12 MED ORDER — PREDNISONE 20 MG PO TABS: 40.0000 mg | ORAL_TABLET | Freq: Every day | ORAL | Status: DC

## 2023-12-12 MED ORDER — INSULIN ASPART 100 UNIT/ML IJ SOLN
3.0000 [IU] | INTRAMUSCULAR | Status: DC
Start: 2023-12-12 — End: 2023-12-15
  Administered 2023-12-12 – 2023-12-14 (×11): 3 [IU] via SUBCUTANEOUS

## 2023-12-12 MED ORDER — PREDNISONE 20 MG PO TABS: 30.0000 mg | ORAL_TABLET | Freq: Every day | ORAL | Status: DC

## 2023-12-12 MED ORDER — LACOSAMIDE 50 MG PO TABS
50.0000 mg | ORAL_TABLET | Freq: Two times a day (BID) | ORAL | Status: DC
  Administered 2023-12-12 – 2023-12-14 (×5): 50 mg
  Filled 2023-12-12 (×6): qty 1

## 2023-12-12 MED ORDER — FREE WATER
300.0000 mL | Status: DC
  Administered 2023-12-12 – 2023-12-13 (×6): 300 mL

## 2023-12-12 MED ORDER — LACOSAMIDE 50 MG PO TABS
50.0000 mg | ORAL_TABLET | Freq: Two times a day (BID) | ORAL | Status: DC
  Administered 2023-12-12: 50 mg via ORAL
  Filled 2023-12-12: qty 1

## 2023-12-12 MED ORDER — SODIUM BICARBONATE 650 MG PO TABS
650.0000 mg | ORAL_TABLET | Freq: Two times a day (BID) | ORAL | Status: AC
  Administered 2023-12-12 (×2): 650 mg
  Filled 2023-12-12 (×2): qty 1

## 2023-12-12 MED ORDER — PREDNISONE 20 MG PO TABS: 60.0000 mg | ORAL_TABLET | Freq: Every day | ORAL | Status: DC

## 2023-12-12 MED ORDER — PREDNISONE 20 MG PO TABS: 50.0000 mg | ORAL_TABLET | Freq: Every day | ORAL | Status: DC

## 2023-12-12 NOTE — Progress Notes (Signed)
 VAST consult for PICC removal. Secure chat sent to Selmer Dominion NP requesting order be placed for PICC line removal. Gunnar Fusi NP noted secure chat. Tomasita Morrow, RN VAST

## 2023-12-12 NOTE — Progress Notes (Signed)
 NAME:  Meredith Park, MRN:  [id number], DOB:  [birth year], LOS: 15 ADMISSION DATE:  11/27/2023, CONSULTATION DATE: 3/25 REFERRING MD: Dr. Charm Barges EDP, CHIEF COMPLAINT: Seizure  History of Present Illness:  53 year old female with past medical history as below, which is significant for seizure disorder followed at [facility] neurology, alcohol abuse reportedly sober for 1 year, hypertension, marijuana use, and hyperlipidemia.  Seizures are managed with Lamictal and recently added topamax with better control of seizures.  Last seen by neurology 07/19/2023.  Family reports she did have a mixup with her seizure medications a week prior to arrival.  She ran out of one of her medications and did not realize it.  This has been remedied and she is back taking both medications.  The patient was in her usual state of health until 3/25 when she was found down beside her bed by her family at approximately 7 PM.  Last known well around 2:30 PM.  Her mother found her and noticed her leg shaking and she was unresponsive.  EMS was called upon EMS arrival she was noted to have continuous full body seizure as well as vomiting.  She was treated with 10 mg of IM Versed with cessation of seizures.  She remained minimally responsive in the emergency department.  She was hypoxic requiring 10 L of oxygen.  Chest x-ray was unremarkable.  CT of the head and C-spine were unremarkable for acute issues with the exception of findings associated with chronic paranasal sinusitis.  Due to poor responsiveness PCCM was asked to evaluate for ICU admission.  Pertinent  Medical History   has a past medical history of Alcohol abuse, Alcohol withdrawal (HCC) (08/28/2018), High cholesterol, Hypertension, and Seizures (HCC).   Significant Hospital Events: Including procedures, antibiotic start and stop dates in addition to other pertinent events   3/25 admit to ICU. Intubated 3/26 LP performed. Increasing pressor requirement 3/27 Increased  vasopressor requirement with worsening MOD including AKI, shock liver. Echo with stress CM. 3/29 Improved vasopressor requirements 4/2 central line d/c, extubated (had cuff leak), reintubated for stridor & secretions several hours later 4/5 CTH w/progression of unihemispheric edema, thought more c/w seizure changes 4/6 LTM, steroids, 4/7 extubated, intermittently on predex for agitation, LTM d/c'd, tmax 101.6/ WBC stable  Interim History / Subjective:  Remains on dex 0.7.  Pt without complaints of pain or anxiety  Objective   Blood pressure (!) 152/99, pulse (!) 106, temperature 98.5 F (36.9 C), temperature source Axillary, resp. rate 17, height 5' 2.01" (1.575 m), weight 63 kg, last menstrual period 08/28/2020, SpO2 98%.        Intake/Output Summary (Last 24 hours) at 12/12/2023 1134 Last data filed at 12/12/2023 1047 Gross per 24 hour  Intake 2204.18 ml  Output 1725 ml  Net 479.18 ml   Filed Weights   12/10/23 0500 12/11/23 0500 12/12/23 0500  Weight: 61.4 kg 56.6 kg 63 kg   Physical Exam: General:  Adult female sitting in bed in NAD HEENT: MM pink/moist, pupils 5/r, cortrak, mouths some words but no phonation Neuro: awake, follows commands, 5/5 on R, dense left hemiplegia with neglect, contracted LUE CV: rr, NSR PULM:  non labored, clear, RA GI: soft, bs+, purwick, rectal pouch Extremities: warm/dry, no LE edema  Skin: no rashes    Tmax 99.7 CBG trend> improving in 160s Labs reviewed> Na 154, K 3.3, bicarb 17, BUN 75, sCr 1.17, Mag 2.8, PCT down 0.29, WBC 13.1, H/H stable UOP 2L Stool 800 ml/ 24hrs  Resolved Hospital Problem list   Rhabdomyolysis Thrombocytopenia Septic shock secondary Acute respiratory failure with hypoxia Aspiration pneumonia Post-extubation stridor, delayed  Assessment & Plan:   Status epilepticus, resolved - Home lamictal, topamax however recently off due to mix-up. CTA neg for LVO. CT spin no acute fx.  Unclear how long pt was seizing  prior to be found down Unihemispheric edema of right cerebral hemisphere  - unclear etiology, possible 2/2 post ictal state vs autoimmune/ paraneoplastic process Acute encephalopathy 2/2 above with agitation, improving - MR Brain with findings c/w prolonged status epilepticus Prior hx of ETOH abuse, reportedly sober x 1 yr - of LTM 4/7.  High dose steroids started 4/6, taper started 4/9 - CT a/p 4/6 > no concerns for malignancy Dysphagia  P:  - Appreciate Neuro assistance/ input - stable neuro, continue to monitor  - AEDs per neuro> vimpat, lamictal, topamax - s/p 3 days of pulse dose steroids, slow taper 4/9 (for 6 weeks) - autoimmune and paraneoplastic encephalopathy panel pending.  anti-TIP thyroglobulin ab neg - seizure precautions w/ prn ativan for clinical seizures - serial neuro exams - off precedex w/ clonidine> taper - thiamine - PT/ OT/ SLP - continue aspiration precautions, remains on RA - EN per cortrak for now pending ongoing SLP - aggressive pulm hygiene - remains hemodynamically/ neurological stable for transfer to 4NP bed when available, tx to [facility] for primary service as of 4/10  SIRS, improving P:  - fever curve continues to improve, WBC down trending.  LE dopplers neg.  Could be inflammatory response rather than infectious - continue to monitor clinically off abx  - if can get PIV> likely d/c PICC   New onset cardiomyopathy with EF 40-45% and new LV WMA, suspect stress- induced Troponin elevation-- favor due to seizures, but no ischemia evaluation yet. Unlikely NSTEMI/ ACS. Echo newly depressed EF 40-45% with LV WMA, grade I DD, small pericardial effusion without tamponade History of hypertension - Appreciate Cardiology input. Not a candidate for cath due to critical illness. Likely stress induced CM P:  - clonidine taper, continue coreg, adding back losartan - daily diuresis assessment, defer today, wt trend better, last dosed 4/8  AKI 2/2 rhabdo, ATN, CIN,  improving Metabolic acidosis Hypernatremia Mild NAGMA/ hyperchloremic - bicarb x 2> monitor stool output - increase FWF - trend renal indices  - strict I/Os, daily wts - avoid nephrotoxins, renal dose meds, hemodynamic support as above  Hypokalemia - replete aggressively, Mag remains ok.  Mag ok.  Trend on labs  Anemia of critical illness - stable, no evidence of bleeding.   - trend CBC  Transaminitis - LFT's down trending 4/8.  Trend periodically   Hyperglycemia due to steroids - cont TF coverage and prn SSI to moderate -  Will need to adjust coverage once off steroids - goal BG 140-180  Severe malnutrition - EN per cortrak/ RD, appreciate assistance  DTPI- buttock 2/2 to FMS - supportive care/ turns, nutrition  Best Practice (right click and "Reselect all SmartList Selections" daily)   Diet/type: tubefeeds DVT prophylaxis prophylactic heparin    Pressure ulcer(s): N/A GI prophylaxis: PPI while on steroids Lines: RUE PICC>  Foley:  N/A Code Status:  full code Last date of multidisciplinary goals of care discussion [ ongoing]   Mother, [name] updated by phone 4/9  Critical care time: n/a     Posey Boyer, MSN, AG-ACNP-BC  Pulmonary & Critical Care 12/12/2023, 11:34 AM  See Amion for pager If no response to pager ,  please call [phone] until 7pm After 7:00 pm call Elink  [phone]

## 2023-12-12 NOTE — Plan of Care (Signed)
 Problem: Education: Goal: Knowledge of General Education information will improve Description: Including pain rating scale, medication(s)/side effects and non-pharmacologic comfort measures Outcome: Not Progressing   Problem: Health Behavior/Discharge Planning: Goal: Ability to manage health-related needs will improve Outcome: Not Progressing   Problem: Clinical Measurements: Goal: Ability to maintain clinical measurements within normal limits will improve Outcome: Progressing Goal: Will remain free from infection Outcome: Progressing Goal: Diagnostic test results will improve Outcome: Progressing Goal: Respiratory complications will improve Outcome: Progressing Goal: Cardiovascular complication will be avoided Outcome: Progressing   Problem: Activity: Goal: Risk for activity intolerance will decrease Outcome: Progressing   Problem: Nutrition: Goal: Adequate nutrition will be maintained Outcome: Progressing   Problem: Coping: Goal: Level of anxiety will decrease Outcome: Progressing   Problem: Elimination: Goal: Will not experience complications related to bowel motility Outcome: Progressing Goal: Will not experience complications related to urinary retention Outcome: Not Progressing   Problem: Pain Managment: Goal: General experience of comfort will improve and/or be controlled Outcome: Progressing   Problem: Safety: Goal: Ability to remain free from injury will improve Outcome: Progressing   Problem: Skin Integrity: Goal: Risk for impaired skin integrity will decrease Outcome: Progressing   Problem: Activity: Goal: Ability to tolerate increased activity will improve Outcome: Progressing   Problem: Respiratory: Goal: Ability to maintain a clear airway and adequate ventilation will improve Outcome: Progressing   Problem: Role Relationship: Goal: Method of communication will improve Outcome: Not Progressing   Problem: Education: Goal: Ability to  describe self-care measures that may prevent or decrease complications (Diabetes Survival Skills Education) will improve Outcome: Not Progressing Goal: Individualized Educational Video(s) Outcome: Progressing   Problem: Coping: Goal: Ability to adjust to condition or change in health will improve Outcome: Progressing   Problem: Fluid Volume: Goal: Ability to maintain a balanced intake and output will improve Outcome: Progressing   Problem: Health Behavior/Discharge Planning: Goal: Ability to identify and utilize available resources and services will improve Outcome: Not Progressing Goal: Ability to manage health-related needs will improve Outcome: Not Progressing   Problem: Metabolic: Goal: Ability to maintain appropriate glucose levels will improve Outcome: Progressing   Problem: Nutritional: Goal: Maintenance of adequate nutrition will improve Outcome: Progressing Goal: Progress toward achieving an optimal weight will improve Outcome: Progressing   Problem: Skin Integrity: Goal: Risk for impaired skin integrity will decrease Outcome: Progressing   Problem: Tissue Perfusion: Goal: Adequacy of tissue perfusion will improve Outcome: Progressing   Problem: Safety: Goal: Non-violent Restraint(s) Outcome: Progressing   Problem: Education: Goal: Knowledge of disease or condition will improve Outcome: Not Progressing Goal: Knowledge of secondary prevention will improve (MUST DOCUMENT ALL) Outcome: Not Progressing Goal: Knowledge of patient specific risk factors will improve (DELETE if not current risk factor) Outcome: Not Progressing   Problem: Ischemic Stroke/TIA Tissue Perfusion: Goal: Complications of ischemic stroke/TIA will be minimized Outcome: Progressing   Problem: Coping: Goal: Will verbalize positive feelings about self Outcome: Not Progressing Goal: Will identify appropriate support needs Outcome: Not Progressing   Problem: Health Behavior/Discharge  Planning: Goal: Ability to manage health-related needs will improve Outcome: Not Progressing Goal: Goals will be collaboratively established with patient/family Outcome: Progressing   Problem: Self-Care: Goal: Ability to participate in self-care as condition permits will improve Outcome: Not Progressing Goal: Verbalization of feelings and concerns over difficulty with self-care will improve Outcome: Not Progressing Goal: Ability to communicate needs accurately will improve Outcome: Not Progressing   Problem: Nutrition: Goal: Risk of aspiration will decrease Outcome: Progressing Goal: Dietary intake will improve Outcome:  Progressing

## 2023-12-12 NOTE — Progress Notes (Signed)
 Speech Language Pathology Treatment: Dysphagia  Patient Details Name: Meredith Park MRN: [id number] DOB: Nov 06, 1970 Today's Date: 12/12/2023 Time: 8657-8469 SLP Time Calculation (min) (ACUTE ONLY): 13 min  Assessment / Plan / Recommendation Clinical Impression  Skilled therapy session focused on dysphagia goals. Nursing present in room upon SLP entrance. SLP completed oral care via suction. SLP offered tsp sips of thin liquids and ice chips. Patient continues to present with moderate anterior spillage of bolus (thin>ice chips), and continues to require cues to initiate mastication. Patient with immediate cough after both thin liquids and ice chips indicative of possible bolus misdirection. Of note, patient with functional improvements in oral phase this date compared to yesterday. Recommended continuation of NPO w/ alternative means of nutrition, hydration and medication administration.    HPI HPI: 53 yo female presenting s/p seizure with vomiting. Admitted with acute encephalopathy, acute respiratory failure, septic shock secondary to aspiration PNA. ETT 3/25-4/2; reintubated for stridor and secretions 4/2-4/7. MRI 4/6: diffuse cortical edema and slight increased vascularity to the  right hemisphere, possibly postictal. PMH: HTN, HLD, ETOH abuse hx, seizure. Previous SLUMS 2023: 26/30 (errors primarily with delayed recall, mild word-finding).      SLP Plan  Continue with current plan of care      Recommendations for follow up therapy are one component of a multi-disciplinary discharge planning process, led by the attending physician.  Recommendations may be updated based on patient status, additional functional criteria and insurance authorization.    Recommendations  Diet recommendations: NPO Medication Administration: Via alternative means        Oral care QID   Frequent or constant Supervision/Assistance Cognitive communication deficit (R41.841);Dysphagia, unspecified  (R13.10)     Continue with current plan of care    Meredith Park M.A., CCC-SLP 12/12/2023, 3:52 PM

## 2023-12-12 NOTE — Progress Notes (Signed)
 Physical Therapy Treatment Patient Details Name: Meredith Park MRN: [id number] DOB: 05-14-1971 Today's Date: 12/12/2023   History of Present Illness 53 yo female admitted 11/27/23 s/p seizure with acute encephalopathy, acute respiratory failure, septic shock secondary to aspiration PNA. LP 3/26, ETT 3/26-4/2, re-intubated 4/2. Extubated 4/7. PMH: HTN, HLD ETOH abuse hx, seizure    PT Comments  Pt progressing towards her physical therapy goals this session. Able to actively move LLE against gravity, but LUE continues to be flaccid with shoulder subluxation noted. Transferred from bed to chair via maxi sky lift. Once up in chair, pt able to transition to standing with two person maximal assist. Pt demonstrated ability to follow a few commands today, but continues to make no attempts to verbalize and is very restless. Will benefit from post acute rehab to address deficits and maximize functional mobility.     If plan is discharge home, recommend the following: Two people to help with walking and/or transfers;Two people to help with bathing/dressing/bathroom;Assistance with cooking/housework;Direct supervision/assist for medications management;Assistance with feeding;Assist for transportation;Direct supervision/assist for financial management;Help with stairs or ramp for entrance   Can travel by private vehicle        Equipment Recommendations  Other (comment) (TBA)    Recommendations for Other Services       Precautions / Restrictions Precautions Precautions: Fall;Other (comment) Recall of Precautions/Restrictions: Impaired Precaution/Restrictions Comments: Flexiseal, NGT, R wrist restraint Restrictions Weight Bearing Restrictions Per Provider Order: No     Mobility  Bed Mobility Overal bed mobility: Needs Assistance Bed Mobility: Rolling Rolling: Max assist         General bed mobility comments: MaxA  for rolling to R/L for placement of maxi sky lift pad     Transfers Overall transfer level: Needs assistance Equipment used: 2 person hand held assist, Ambulation equipment used Transfers: Sit to/from Stand, Bed to chair/wheelchair/BSC Sit to Stand: Max assist, +2 physical assistance           General transfer comment: Maxisky lift from bed to chair. +2 maxA from chair to stand with use of bed pad to lift hips. Pt with flexed posture but no L knee buckle. Able to sustain for at least 60 seconds Transfer via Lift Equipment: Maxisky  Ambulation/Gait               General Gait Details: unable   Stairs             Wheelchair Mobility     Tilt Bed    Modified Rankin (Stroke Patients Only) Modified Rankin (Stroke Patients Only) Pre-Morbid Rankin Score: No symptoms Modified Rankin: Severe disability     Balance Overall balance assessment: Needs assistance Sitting-balance support: Feet supported Sitting balance-Leahy Scale: Poor Sitting balance - Comments: Left lateral lean   Standing balance support: Bilateral upper extremity supported Standing balance-Leahy Scale: Poor                              Communication Communication Communication: Impaired Factors Affecting Communication:  (no attempts to verbalize)  Cognition Arousal: Alert Behavior During Therapy: Restless   PT - Cognitive impairments: Difficult to assess                       PT - Cognition Comments: pt very restless and fidgety with R hand, able to follow more commands today with delay and with repetition Following commands: Impaired Following commands impaired: Follows one step commands  inconsistently    Cueing Cueing Techniques: Verbal cues, Tactile cues  Exercises      General Comments        Pertinent Vitals/Pain Pain Assessment Pain Assessment: Faces Faces Pain Scale: No hurt    Home Living                          Prior Function            PT Goals (current goals can now be found in the  care plan section) Acute Rehab PT Goals Patient Stated Goal: unable to state Potential to Achieve Goals: Fair Progress towards PT goals: Progressing toward goals    Frequency    Min 2X/week      PT Plan      Co-evaluation              AM-PAC PT "6 Clicks" Mobility   Outcome Measure  Help needed turning from your back to your side while in a flat bed without using bedrails?: Total Help needed moving from lying on your back to sitting on the side of a flat bed without using bedrails?: Total Help needed moving to and from a bed to a chair (including a wheelchair)?: Total Help needed standing up from a chair using your arms (e.g., wheelchair or bedside chair)?: Total Help needed to walk in hospital room?: Total Help needed climbing 3-5 steps with a railing? : Total 6 Click Score: 6    End of Session Equipment Utilized During Treatment: Gait belt Activity Tolerance: Patient tolerated treatment well Patient left: in chair;with call bell/phone within reach;with restraints reapplied;Other (comment) (posey chair alarm belt) Nurse Communication: Mobility status PT Visit Diagnosis: Muscle weakness (generalized) (M62.81);Difficulty in walking, not elsewhere classified (R26.2);Other symptoms and signs involving the nervous system (R29.898)     Time: 4098-1191 PT Time Calculation (min) (ACUTE ONLY): 32 min  Charges:    $Therapeutic Activity: 23-37 mins PT General Charges $$ ACUTE PT VISIT: 1 Visit                     Lillia Pauls, PT, DPT Acute Rehabilitation Services Office [phone]    Norval Morton 12/12/2023, 11:54 AM

## 2023-12-12 NOTE — Progress Notes (Addendum)
 Inpatient Rehab Admissions Coordinator:    CIR consult received. I spoke with pt.'s mother who states that she cannot provide physical assist at d/c. She states that Pt. Does have an adult daughter, but she is currently caring for her 2 small children, one of whom is an infant with health problems. They would like to pursue SNF for Pt. As that would give her a longer period to work towards independence.  CIR will not pursue.   Megan Salon, MS, CCC-SLP Rehab Admissions Coordinator  [phone] (celll) [phone] (office)

## 2023-12-12 NOTE — Progress Notes (Addendum)
 Subjective: No acute events overnight.  Afebrile now.  ROS: Unable to obtain due to poor mental status  Examination  Vital signs in last 24 hours: Temp:  [97.2 F (36.2 C)-99.7 F (37.6 C)] 98.5 F (36.9 C) (04/09 0800) Pulse Rate:  [87-120] 106 (04/09 0800) Resp:  [15-24] 17 (04/09 0800) BP: (82-156)/(54-104) 152/99 (04/09 0800) SpO2:  [96 %-100 %] 98 % (04/09 0800) Weight:  [63 kg] 63 kg (04/09 0500)  General: Sitting in chair, not in apparent distress Neuro: Awake, alert, did not answer orientation questions, follows commands but has profound left hemineglect, does withdraw to noxious stimuli in left lower extremity, I did not appreciate any withdrawal in left upper extremity today  Basic Metabolic Panel: Recent Labs  Lab 12/07/23 0612 12/08/23 0931 12/08/23 1923 12/09/23 0312 12/09/23 0932 12/10/23 0444 12/11/23 0530 12/12/23 0600  NA 145 143   < > 145  144 145 147* 151* 154*  K 3.8 3.3*  --  3.3*  --  3.5 3.4* 3.3*  CL 109 105  --  109  --  113* 119* 124*  CO2 23 24  --  23  --  22 19* 17*  GLUCOSE 190* 163*  --  160*  --  220* 225* 208*  BUN 67* 60*  --  62*  --  68* 69* 75*  CREATININE 1.60* 1.55*  --  1.48*  --  1.53* 1.42* 1.17*  CALCIUM 8.4* 8.7*  --  8.5*  --  8.6* 9.2 9.0  MG 2.2  --   --   --   --   --  2.9* 2.8*  PHOS  --   --   --   --   --   --   --  4.0   < > = values in this interval not displayed.    CBC: Recent Labs  Lab 12/07/23 0829 12/08/23 0931 12/10/23 0444 12/11/23 0530 12/12/23 0600  WBC 14.2* 11.0* 11.6* 14.7* 13.1*  HGB 9.1* 10.2* 9.2* 9.9* 9.5*  HCT 28.6* 31.8* 29.9* 31.5* 31.1*  MCV 86.4 85.7 89.5 88.7 89.9  PLT 317 337 458* 519* 522*     Coagulation Studies: No results for input(s): "LABPROT", "INR" in the last 72 hours.  Imaging No new brain imaging overnight   ASSESSMENT AND PLAN: 53 y.o. female past medical history of alcohol and substance abuse, hypertension, seizures was found wedged between bed and nightstand and  seizing for unknown duration.  Brought in after Versed in the field with resolution of clinical seizure activity.  Obtunded mentation after significant passage of time prompted further workup. MRI brain with cortical changes in the right hemisphere and crossed cerebellar diaschisis, concerning more for a postictal phenomenology. EEG with LPD's likely ictal in nature.  CT angiogram was done-no emergent occlusion. CSF studies bland with no pleocytosis, mildly elevated protein, negative meningitis/encephalitis panel, no organisms on CSF gram stain and no growth on CSF cultures x 5 days. Exam remains poor despite resolution of ictal activity on the EEG with AED adjustments.  Repeat head CT done that shows extensive right hemispheric edema more than expected from postictal from neurology. Repeat MRI brain shows progression of earlier noted unihemispheric edema of the right cerebral hemisphere, which now includes most of the white matter.     Status epilepticus convulsive, resolved Acute encephalopathy due to seizure Cardiomyopathy with reduced EF -- likely secondary to stress Acute respiratory failure with hypoxia  Fever - Etiology of her cortical unilateral brain edema remains unclear,  still suspect this is post ictal but with progression of edema.  Other differentials include potential autoimmune/paraneoplastic/vasculitic pathology   - Serum Autoimmune encephalopathy panel, serum paraneoplastic encephalopathy panel, Anti TPO thyroglobulin antibodies pending - continue Topamax 100mg  daily, Lamotrigine 250mg  BID, Vimpat 50mg  BID. - Completed solumedrol 1000mg  daily D3/3 empirically due to concern for potential autoimmune/paraneoplastic pathology.  Currently on prednisone taper at 60 mg with plan to decrease by 10 mg every week - Neurology to continue to follow   I have spent a total of  25 minutes with the patient reviewing hospital notes,  test results, labs and examining the patient as well as  establishing an assessment and plan. > 50% of time was spent in direct patient care.        Lindie Spruce Epilepsy [facility] For questions after 5pm please refer to AMION to reach the Neurologist on call

## 2023-12-12 NOTE — TOC Initial Note (Signed)
 Transition of Care [facility]) - Initial/Assessment Note    Patient Details  Name: Meredith Park MRN: [id number] Date of Birth: February 26, 1971  [facility]) CM/SW Contact:    Mearl Latin, LCSW Phone Number: 12/12/2023, 3:51 PM  Clinical Narrative:                 CSW following for SNF recommendation as CIR unable to accept due to lack of home support. Will fax out referrals once Cortrak is removed.   Expected Discharge Plan: Skilled Nursing Facility Barriers to Discharge: Continued Medical Work up, English as a second language teacher, SNF Pending bed offer   Patient Goals and CMS Choice Patient states their goals for this hospitalization and ongoing recovery are:: Rehab          Expected Discharge Plan and Services In-house Referral: Clinical Social Work   Post Acute Care Choice: Skilled Nursing Facility Living arrangements for the past 2 months: Single Family Home                                      Prior Living Arrangements/Services Living arrangements for the past 2 months: Single Family Home   Patient language and need for interpreter reviewed:: Yes Do you feel safe going back to the place where you live?: Yes      Need for Family Participation in Patient Care: Yes (Comment) Care giver support system in place?: No (comment)   Criminal Activity/Legal Involvement Pertinent to Current Situation/Hospitalization: No - Comment as needed  Activities of Daily Living   ADL Screening (condition at time of admission) Independently performs ADLs?: No Does the patient have a NEW difficulty with bathing/dressing/toileting/self-feeding that is expected to last >3 days?: Yes (Initiates electronic notice to provider for possible OT consult) Does the patient have a NEW difficulty with getting in/out of bed, walking, or climbing stairs that is expected to last >3 days?: Yes (Initiates electronic notice to provider for possible PT consult) Does the patient have a NEW difficulty  with communication that is expected to last >3 days?: Yes (Initiates electronic notice to provider for possible SLP consult) Is the patient deaf or have difficulty hearing?: No Does the patient have difficulty seeing, even when wearing glasses/contacts?: Yes Does the patient have difficulty concentrating, remembering, or making decisions?: Yes  Permission Sought/Granted Permission sought to share information with : Facility Medical sales representative, Family Supports Permission granted to share information with : No  Share Information with NAME: [name]  Permission granted to share info w AGENCY: SNFs  Permission granted to share info w Relationship: Mother  Permission granted to share info w Contact Information: [phone]  Emotional Assessment Appearance:: Appears stated age Attitude/Demeanor/Rapport: Unable to Assess Affect (typically observed): Unable to Assess Orientation: : Oriented to Self Alcohol / Substance Use: Alcohol Use, Illicit Drugs Psych Involvement: No (comment)  Admission diagnosis:  Status epilepticus (HCC) [G40.901] AKI (acute kidney injury) (HCC) [N17.9] Seizure-like activity (HCC) [R56.9] Altered mental status, unspecified altered mental status type [R41.82] Patient Active Problem List   Diagnosis Date Noted   Protein-calorie malnutrition, severe 12/07/2023   Altered mental status 12/07/2023   Non-ST elevation (NSTEMI) myocardial infarction (HCC) 11/29/2023   Acute systolic heart failure (HCC) 11/29/2023   AKI (acute kidney injury) (HCC) 11/29/2023   Acute hypoxemic respiratory failure (HCC) 11/29/2023   Shock liver 11/29/2023   Elevated liver enzymes 11/29/2023   Shock (HCC) 11/28/2023   Status epilepticus (HCC)  11/27/2023   Alcohol abuse 11/07/2022   QT prolongation 11/07/2022   Leukocytosis 11/07/2022   Hyponatremia 11/07/2022   Rhabdomyolysis 11/07/2022   High anion gap metabolic acidosis 11/07/2022   Hypokalemia 09/30/2021   Cannabis abuse-Urine drug  screen +ve on 1/26 09/30/2021   Breakthrough seizure (HCC) 09/29/2021   Alcohol dependence in remission (HCC) 04/12/2018   Adjustment disorder with mixed anxiety and depressed mood 08/26/2017   Essential (primary) hypertension 08/26/2017   Mixed hyperlipidemia 08/26/2017   PCP:  Burnis Medin, PA-C Pharmacy:   [id number] - [place], [place] - [address] [place] [address] Phone: [phone] Fax: [phone]  [facility] [address] [place] [address] Phone: [phone] Fax: [phone]     Social Drivers of Health (SDOH) Social History: SDOH Screenings   Food Insecurity: Low Risk  (07/18/2023)   Received from [facility]  Housing: Low Risk  (07/18/2023)   Received from [facility]  Transportation Needs: No Transportation Needs (07/18/2023)   Received from [facility]  Utilities: Low Risk  (07/18/2023)   Received from [facility]  Tobacco Use: Low Risk  (12/11/2023)  Recent Concern: Tobacco Use - Medium Risk (11/05/2023)   Received from [facility]   SDOH Interventions:     Readmission Risk Interventions    12/12/2023    3:50 PM  Readmission Risk Prevention Plan  Transportation Screening Complete  Medication Review (RN Care Manager) Complete  PCP or Specialist appointment within 3-5 days of discharge Complete  HRI or Home Care Consult Complete  SW Recovery Care/Counseling Consult Complete  Palliative Care Screening Not Applicable  Skilled Nursing Facility Complete

## 2023-12-13 DIAGNOSIS — R569 Unspecified convulsions: Secondary | ICD-10-CM | POA: Diagnosis not present

## 2023-12-13 DIAGNOSIS — G936 Cerebral edema: Secondary | ICD-10-CM | POA: Diagnosis not present

## 2023-12-13 DIAGNOSIS — J9601 Acute respiratory failure with hypoxia: Secondary | ICD-10-CM | POA: Diagnosis not present

## 2023-12-13 DIAGNOSIS — G40901 Epilepsy, unspecified, not intractable, with status epilepticus: Secondary | ICD-10-CM | POA: Diagnosis not present

## 2023-12-13 DIAGNOSIS — G9340 Encephalopathy, unspecified: Secondary | ICD-10-CM | POA: Diagnosis not present

## 2023-12-13 LAB — CBC
HCT: 34.4 % — ABNORMAL LOW (ref 36.0–46.0)
Hemoglobin: 10.5 g/dL — ABNORMAL LOW (ref 12.0–15.0)
MCH: 27.5 pg (ref 26.0–34.0)
MCHC: 30.5 g/dL (ref 30.0–36.0)
MCV: 90.1 fL (ref 80.0–100.0)
Platelets: 574 10*3/uL — ABNORMAL HIGH (ref 150–400)
RBC: 3.82 MIL/uL — ABNORMAL LOW (ref 3.87–5.11)
RDW: 16.1 % — ABNORMAL HIGH (ref 11.5–15.5)
WBC: 16.3 10*3/uL — ABNORMAL HIGH (ref 4.0–10.5)
nRBC: 0 % (ref 0.0–0.2)

## 2023-12-13 LAB — BASIC METABOLIC PANEL WITH GFR
Anion gap: 13 (ref 5–15)
BUN: 68 mg/dL — ABNORMAL HIGH (ref 6–20)
CO2: 15 mmol/L — ABNORMAL LOW (ref 22–32)
Calcium: 9 mg/dL (ref 8.9–10.3)
Chloride: 129 mmol/L — ABNORMAL HIGH (ref 98–111)
Creatinine, Ser: 1.03 mg/dL — ABNORMAL HIGH (ref 0.44–1.00)
GFR, Estimated: >60 mL/min (ref >60)
Glucose, Bld: 148 mg/dL — ABNORMAL HIGH (ref 70–99)
Potassium: 4 mmol/L (ref 3.5–5.1)
Sodium: 157 mmol/L — ABNORMAL HIGH (ref 135–145)

## 2023-12-13 LAB — GLUCOSE, CAPILLARY
Glucose-Capillary: 139 mg/dL — ABNORMAL HIGH (ref 70–99)
Glucose-Capillary: 133 mg/dL — ABNORMAL HIGH (ref 70–99)
Glucose-Capillary: 159 mg/dL — ABNORMAL HIGH (ref 70–99)
Glucose-Capillary: 151 mg/dL — ABNORMAL HIGH (ref 70–99)
Glucose-Capillary: 150 mg/dL — ABNORMAL HIGH (ref 70–99)
Glucose-Capillary: 139 mg/dL — ABNORMAL HIGH (ref 70–99)

## 2023-12-13 MED ORDER — DEXTROSE-SODIUM CHLORIDE 5-0.45 % IV SOLN: INTRAVENOUS | Status: DC

## 2023-12-13 MED ORDER — CHLORHEXIDINE GLUCONATE CLOTH 2 % EX PADS
6.0000 | MEDICATED_PAD | Freq: Every day | CUTANEOUS | Status: DC
  Administered 2023-12-13 – 2024-01-01 (×20): 6 via TOPICAL

## 2023-12-13 MED ORDER — FREE WATER
300.0000 mL | Status: DC
  Administered 2023-12-13 – 2023-12-14 (×8): 300 mL

## 2023-12-13 MED ORDER — METHOCARBAMOL 500 MG PO TABS
1000.0000 mg | ORAL_TABLET | Freq: Three times a day (TID) | ORAL | Status: DC
  Administered 2023-12-13 – 2023-12-15 (×5): 1000 mg
  Filled 2023-12-13 (×5): qty 2

## 2023-12-13 NOTE — Progress Notes (Signed)
 PROGRESS NOTE    Meredith Park  ZOX:[id number] DOB: 06-Aug-1971 DOA: 11/27/2023 PCP: Burnis Medin, PA-C    Brief Narrative:  53 year old with history of previous alcohol abuse and alcohol withdrawal, hypertension, hyperlipidemia and seizure disorder.  Abstinence from alcohol for last 1 year.  Patient was found down beside her bed by her family members.  Noted to be shaking and was unresponsive.  EMS was called, she was found to be in generalized tonic-clonic seizure.  She was treated with Versed with cessation of seizures.  She remained minimally responsive in the emergency department.  Hypoxemic requiring 10 L of oxygen.  Patient was intubated to protect airway and admitted to the ICU.    Significant events as below. 3/25 admit to ICU.  Hypoxemic and unresponsive.  Intubated 3/26 LP performed. Increasing pressor requirement 3/27 Increased vasopressor requirement with worsening MOD including AKI, shock liver. Echo with stress CM. 3/29 Improved vasopressor requirements 4/2 central line d/c, extubated (had cuff leak), reintubated for stridor & secretions several hours later 4/5 CTH w/progression of unihemispheric edema, thought more c/w seizure changes 4/6 LTM, steroids, 4/7 extubated, intermittently on predex for agitation, LTM d/c'd, tmax 101.6/ WBC stable 4/10 on room air.  Transferred to medical floor.  Core track feeding.  Significant studies: Brain MRI 3/28 with extensive cortical restricted diffusion in the right cerebral hemisphere Brain MRI 4/6 with diffuse cortical edema and increased vascularity to the right hemisphere Spinal tap, no evidence of infection. EEG, postictal.    Subjective: Patient seen and examined.  Remains on room air.  She is not able to communicate.  She was using her right hand to hold Yankauer suction and pointing to different directions.  She did follow simple commands and moved her right side.  Did not move her left side.  No family at the  bedside.  Sodium is steadily increasing and 157 today.  Chloride is 129. Apparently sodium goal is 150 and above due to cerebral edema.    Assessment & Plan:   Status epilepticus with history of seizure disorder Acute encephalopathy secondary to status epilepticus Unit hemispheric edema of the right cerebral hemisphere consistent with postictal state Acute respiratory failure with hypoxemia, respiratory insufficiency secondary to unresponsiveness and status.  Patient was on mechanical ventilation, reintubated and currently extubated to room air.  Continue pulmonary toilet as able.  Patient is not able to participate in incentive and flutter valve therapy. Neurology following, they are recommending to continue Vimpat, Lamictal and Topamax. For cerebral edema patient was given high-dose steroids and subsequently plan to to very slow taper of his steroids next several weeks. Initial suspicion for paraneoplastic syndrome, serology pending.  CT scan of the chest abdomen pelvis was without any concern for malignancy.  Paraneoplastic serology and autoimmune serologies are pending. Continue seizure precautions.  As needed Ativan for seizure. Continue to work with PT OT and speech.  SIRS: Patient had low-grade fever and leukocytosis.  Doppler studies were negative.  There was no evidence of bacterial infection.  Did not receive any antibiotics.  Suspected metabolic fever.  Will continue to monitor.  New onset cardiomyopathy with ejection fraction 40 to 45%, left ventricular wall motion abnormality and troponin elevation: Thought to be demand ischemia due to seizure and stress.  Does have history of hypertension.  Seen by cardiology.  Recommended conservative management. Patient currently on carvedilol, losartan, intermittent diuresis.  Clonidine taper.  Acute kidney injury secondary to rhabdomyolysis, ATN, metabolic acidosis: Renal functions improved.    Hyperchloremic  hypernatremia: Sodium goal  150-156 as per neurology and critical care.  Will increase free water flush to 300 mL every 3 hours.  Gradual reduction of sodium due to cerebral edema. Replete potassium today.  Magnesium is adequate.  Transaminitis: Due to #1.  Trending down.  Hyperglycemia due to steroids and tube feeding: Covered by insulin.  Severe malnutrition and dysphagia: Secondary to encephalopathy.  Currently not ready to swallow.  Continue tube feeding.  Will probably need PEG tube placement if unable to eat.    DVT prophylaxis: heparin injection 5,000 Units Start: 12/04/23 0930 SCDs Start: 11/27/23 2310   Code Status: Full code Family Communication: None at the bedside.  Will reach to family. Disposition Plan: Status is: Inpatient Remains inpatient appropriate because: Altered mental status, tube feeding     Consultants:  Neurology Cardiology Critical care  Procedures:  Intubation and extubation Spinal tap  Antimicrobials:  None     Objective: Vitals:   12/12/23 2110 12/12/23 2350 12/13/23 0302 12/13/23 0735  BP: (!) 142/89 125/79 (!) 143/99 (!) 149/100  Pulse:  97 91 94  Resp:  20 17 18   Temp:  99.1 F (37.3 C) 98.8 F (37.1 C) 99.8 F (37.7 C)  TempSrc:  Axillary Axillary Axillary  SpO2:  98% 98% 99%  Weight:      Height:        Intake/Output Summary (Last 24 hours) at 12/13/2023 1102 Last data filed at 12/13/2023 0500 Gross per 24 hour  Intake 2461.76 ml  Output 1175 ml  Net 1286.76 ml   Filed Weights   12/10/23 0500 12/11/23 0500 12/12/23 0500  Weight: 61.4 kg 56.6 kg 63 kg    Examination:  General exam: Appears slightly anxious and restless.  Otherwise looks comfortable.  On room air. Respiratory system: Clear to auscultation. Respiratory effort normal.  On room air. Cardiovascular system: S1 & S2 heard, RRR.  Gastrointestinal system: Soft.  Nontender. Central nervous system: Alert and awake.  Difficult to understand.  She follows simple commands.  She moves  right side of the upper and lower extremities with commands.  She has dense hemiplegia on the left side with neglect.    Data Reviewed: I have personally reviewed following labs and imaging studies  CBC: Recent Labs  Lab 12/08/23 0931 12/10/23 0444 12/11/23 0530 12/12/23 0600 12/13/23 0556  WBC 11.0* 11.6* 14.7* 13.1* 16.3*  HGB 10.2* 9.2* 9.9* 9.5* 10.5*  HCT 31.8* 29.9* 31.5* 31.1* 34.4*  MCV 85.7 89.5 88.7 89.9 90.1  PLT 337 458* 519* 522* 574*   Basic Metabolic Panel: Recent Labs  Lab 12/07/23 0612 12/08/23 0931 12/09/23 0312 12/09/23 0932 12/10/23 0444 12/11/23 0530 12/12/23 0600 12/13/23 0556  NA 145   < > 145  144 145 147* 151* 154* 157*  K 3.8   < > 3.3*  --  3.5 3.4* 3.3* 4.0  CL 109   < > 109  --  113* 119* 124* 129*  CO2 23   < > 23  --  22 19* 17* 15*  GLUCOSE 190*   < > 160*  --  220* 225* 208* 148*  BUN 67*   < > 62*  --  68* 69* 75* 68*  CREATININE 1.60*   < > 1.48*  --  1.53* 1.42* 1.17* 1.03*  CALCIUM 8.4*   < > 8.5*  --  8.6* 9.2 9.0 9.0  MG 2.2  --   --   --   --  2.9* 2.8*  --  PHOS  --   --   --   --   --   --  4.0  --    < > = values in this interval not displayed.   GFR: Estimated Creatinine Clearance: 55.8 mL/min (A) (by C-G formula based on SCr of 1.03 mg/dL (H)). Liver Function Tests: Recent Labs  Lab 12/08/23 0931 12/11/23 0530  AST 136* 82*  ALT 166* 106*  ALKPHOS 55 47  BILITOT 0.7 0.6  PROT 6.1* 6.0*  ALBUMIN 3.1* 3.0*   No results for input(s): "LIPASE", "AMYLASE" in the last 168 hours. Recent Labs  Lab 12/08/23 1923  AMMONIA 26   Coagulation Profile: No results for input(s): "INR", "PROTIME" in the last 168 hours. Cardiac Enzymes: No results for input(s): "CKTOTAL", "CKMB", "CKMBINDEX", "TROPONINI" in the last 168 hours. BNP (last 3 results) No results for input(s): "PROBNP" in the last 8760 hours. HbA1C: No results for input(s): "HGBA1C" in the last 72 hours. CBG: Recent Labs  Lab 12/12/23 1549 12/12/23 2004  12/12/23 2350 12/13/23 0259 12/13/23 0730  GLUCAP 158* 170* 104* 139* 133*   Lipid Profile: No results for input(s): "CHOL", "HDL", "LDLCALC", "TRIG", "CHOLHDL", "LDLDIRECT" in the last 72 hours. Thyroid Function Tests: No results for input(s): "TSH", "T4TOTAL", "FREET4", "T3FREE", "THYROIDAB" in the last 72 hours. Anemia Panel: No results for input(s): "VITAMINB12", "FOLATE", "FERRITIN", "TIBC", "IRON", "RETICCTPCT" in the last 72 hours. Sepsis Labs: Recent Labs  Lab 12/10/23 0444 12/12/23 0600  PROCALCITON 0.54 0.29    No results found for this or any previous visit (from the past 240 hours).        Radiology Studies: VAS [place] LOWER EXTREMITY VENOUS (DVT) Result Date: 12/11/2023  Lower Venous DVT Study Patient Name:  Meredith Park  Date of Exam:   12/11/2023 Medical Rec #: [id number]            Accession #:    [id number] Date of Birth: 1971-07-28             Patient Gender: F Patient Age:   60 years Exam Location:  [facility] Procedure:      VAS [place] LOWER EXTREMITY VENOUS (DVT) Referring Phys: Levon Hedger --------------------------------------------------------------------------------  Indications: Edema.  Risk Factors: None identified. Comparison Study: No significant changes seen since previous exam 11/28/23. Performing Technologist: Shona Simpson  Examination Guidelines: A complete evaluation includes B-mode imaging, spectral Doppler, color Doppler, and power Doppler as needed of all accessible portions of each vessel. Bilateral testing is considered an integral part of a complete examination. Limited examinations for reoccurring indications may be performed as noted. The reflux portion of the exam is performed with the patient in reverse Trendelenburg.  +---------+---------------+---------+-----------+----------+--------------+ RIGHT    CompressibilityPhasicitySpontaneityPropertiesThrombus Aging  +---------+---------------+---------+-----------+----------+--------------+ CFV      Full           Yes      Yes                                 +---------+---------------+---------+-----------+----------+--------------+ SFJ      Full                                                        +---------+---------------+---------+-----------+----------+--------------+ FV Prox  Full                                                        +---------+---------------+---------+-----------+----------+--------------+  FV Mid   Full                                                        +---------+---------------+---------+-----------+----------+--------------+ FV DistalFull                                                        +---------+---------------+---------+-----------+----------+--------------+ PFV      Full                    Yes                                 +---------+---------------+---------+-----------+----------+--------------+ POP      Full           Yes      Yes                                 +---------+---------------+---------+-----------+----------+--------------+ PTV      Full                    Yes                                 +---------+---------------+---------+-----------+----------+--------------+ PERO     Full                    Yes                                 +---------+---------------+---------+-----------+----------+--------------+   +---------+---------------+---------+-----------+----------+--------------+ LEFT     CompressibilityPhasicitySpontaneityPropertiesThrombus Aging +---------+---------------+---------+-----------+----------+--------------+ CFV      Full           Yes      Yes                                 +---------+---------------+---------+-----------+----------+--------------+ SFJ      Full                                                         +---------+---------------+---------+-----------+----------+--------------+ FV Prox  Full                                                        +---------+---------------+---------+-----------+----------+--------------+ FV Mid   Full                                                        +---------+---------------+---------+-----------+----------+--------------+  FV DistalFull                                                        +---------+---------------+---------+-----------+----------+--------------+ PFV      Full                                                        +---------+---------------+---------+-----------+----------+--------------+ POP      Full           Yes      Yes                                 +---------+---------------+---------+-----------+----------+--------------+ PTV      Full                    Yes                                 +---------+---------------+---------+-----------+----------+--------------+ PERO     Full                    Yes                                 +---------+---------------+---------+-----------+----------+--------------+     Summary: BILATERAL: - No evidence of deep vein thrombosis seen in the lower extremities, bilaterally. -No evidence of popliteal cyst, bilaterally.   *See table(s) above for measurements and observations. Electronically signed by Lemar Livings MD on 12/11/2023 at 7:48:16 PM.    Final         Scheduled Meds:  acetaminophen  650 mg Per Tube Q6H   carvedilol  12.5 mg Per Tube BID WC   feeding supplement (PROSource TF20)  60 mL Per Tube BID   fiber supplement (BANATROL TF)  60 mL Per Tube BID   folic acid  1 mg Per Tube Daily   free water  300 mL Per Tube Q3H   heparin injection (subcutaneous)  5,000 Units Subcutaneous Q8H   insulin aspart  0-9 Units Subcutaneous Q4H   insulin aspart  3 Units Subcutaneous Q4H   lacosamide  50 mg Per Tube BID   lamoTRIgine  250 mg Per Tube BID    losartan  25 mg Per Tube BID   methocarbamol (ROBAXIN) injection  1,000 mg Intravenous Q8H   methocarbamol  1,000 mg Per Tube TID   multivitamin with minerals  1 tablet Per Tube Daily   nystatin cream   Topical BID   mouth rinse  15 mL Mouth Rinse Q4H   pantoprazole (PROTONIX) IV  40 mg Intravenous QHS   predniSONE  60 mg Per Tube Q breakfast   Followed by   Melene Muller ON 12/18/2023] predniSONE  50 mg Oral Q breakfast   Followed by   Melene Muller ON 12/25/2023] predniSONE  40 mg Oral Q breakfast   Followed by   Melene Muller ON 01/01/2024] predniSONE  30 mg Oral Q breakfast   Followed by   Melene Muller ON 01/08/2024] predniSONE  20 mg Oral Q breakfast   Followed by   Melene Muller ON 01/15/2024] predniSONE  10 mg Oral Q breakfast   QUEtiapine  50 mg Per Tube QHS   sodium chloride flush  10-40 mL Intracatheter Q12H   thiamine  100 mg Per Tube Daily   topiramate  100 mg Per Tube QHS   Continuous Infusions:  feeding supplement (OSMOLITE 1.5 CAL) 1,000 mL (12/12/23 1834)     LOS: 16 days    Time spent: 55 minutes    Dorcas Carrow, MD [facility]

## 2023-12-13 NOTE — Progress Notes (Signed)
 Occupational Therapy Treatment Patient Details Name: Meredith Park MRN: [id number] DOB: 06/07/1971 Today's Date: 12/13/2023   History of present illness 53 yo female admitted 11/27/23 s/p seizure with acute encephalopathy, acute respiratory failure, septic shock secondary to aspiration PNA. LP 3/26, ETT 3/26-4/2, re-intubated 4/2. Extubated 4/7. PMH: HTN, HLD ETOH abuse hx, seizure   OT comments  Patient supine in bed and agreeable to OT session.  Pt following all simple 1 step commands today on R side, a few 2 step commands; heavy R gaze/head turn preference and requires physical support to turn head to L side.  Educated RN to keep L UE supported due to sublux in shoulder; PROM to UE and remains flaccid. Min assist to wash face, max-total assist for all other self care tasks. Updated dc plan to <3hrs/day inpatient setting at this time. Will follow.        If plan is discharge home, recommend the following:  Two people to help with walking and/or transfers;Two people to help with bathing/dressing/bathroom   Equipment Recommendations  Wheelchair cushion (measurements OT);Wheelchair (measurements OT);Hospital bed;Hoyer lift    Recommendations for Other Services      Precautions / Restrictions Precautions Precautions: Fall;Other (comment) Recall of Precautions/Restrictions: Impaired Precaution/Restrictions Comments: Flexiseal, NGT, R wrist restraint Restrictions Weight Bearing Restrictions Per Provider Order: No       Mobility Bed Mobility Overal bed mobility: Needs Assistance             General bed mobility comments: bed into chair    Transfers                         Balance Overall balance assessment: Needs assistance Sitting-balance support: Feet supported Sitting balance-Leahy Scale: Poor Sitting balance - Comments: R lean in bed to chair, placed wedge for midline position in bed                                   ADL either performed  or assessed with clinical judgement   ADL Overall ADL's : Needs assistance/impaired     Grooming: Minimal assistance;Wash/dry face;Bed level                                 General ADL Comments: total assist for all other self care    Extremity/Trunk Assessment Upper Extremity Assessment Upper Extremity Assessment: LUE deficits/detail LUE Deficits / Details: PROM to UE WFL, no activation and no awareness to UE.  shoulder sublux noted and positioned UE on pillows to increase support. LUE: Subluxation noted LUE Sensation: decreased light touch;decreased proprioception LUE Coordination: decreased fine motor;decreased gross motor            Vision   Additional Comments: R head turn and gaze preference, difficulty keep head at midline and physical support to turn to L.  Eyes open throughout session, good depth perception with high 5 task. conitnue assessment.   Perception Perception Perception: Impaired Preception Impairment Details: Inattention/Neglect Perception-Other Comments: L   Praxis     Communication Communication Communication: Impaired   Cognition Arousal: Alert Behavior During Therapy: Restless Cognition: Cognition impaired, Difficult to assess Difficult to assess due to: Impaired communication           OT - Cognition Comments: pt following 1 step commands, a few 2 step simple commands.  waving with R hand  but very poor awareness to L side of body and enviornment.                 Following commands: Impaired Following commands impaired: Follows one step commands with increased time      Cueing   Cueing Techniques: Verbal cues, Tactile cues  Exercises      Shoulder Instructions       General Comments RA with VSS    Pertinent Vitals/ Pain       Pain Assessment Pain Assessment: Faces Faces Pain Scale: No hurt Pain Intervention(s): Monitored during session  Home Living                                           Prior Functioning/Environment              Frequency  Min 2X/week        Progress Toward Goals  OT Goals(current goals can now be found in the care plan section)  Progress towards OT goals: Progressing toward goals  Acute Rehab OT Goals Patient Stated Goal: none stated OT Goal Formulation: Patient unable to participate in goal setting Time For Goal Achievement: 12/21/23 Potential to Achieve Goals: Fair  Plan      Co-evaluation                 AM-PAC OT "6 Clicks" Daily Activity     Outcome Measure   Help from another person eating meals?: Total Help from another person taking care of personal grooming?: A Lot Help from another person toileting, which includes using toliet, bedpan, or urinal?: Total Help from another person bathing (including washing, rinsing, drying)?: A Lot Help from another person to put on and taking off regular upper body clothing?: A Lot Help from another person to put on and taking off regular lower body clothing?: Total 6 Click Score: 9    End of Session    OT Visit Diagnosis: Unsteadiness on feet (R26.81);Muscle weakness (generalized) (M62.81)   Activity Tolerance Patient tolerated treatment well   Patient Left in bed;with call bell/phone within reach;with bed alarm set;with restraints reapplied   Nurse Communication Mobility status;Precautions        Time: 1610-9604 OT Time Calculation (min): 24 min  Charges: OT General Charges $OT Visit: 1 Visit OT Treatments $Self Care/Home Management : 8-22 mins $Neuromuscular Re-education: 8-22 mins  Barry Brunner, OT Acute Rehabilitation Services Office [phone] Secure Chat Preferred    Chancy Milroy 12/13/2023, 2:03 PM

## 2023-12-13 NOTE — Progress Notes (Signed)
 Speech Language Pathology Treatment: Dysphagia  Patient Details Name: Meredith Park MRN: [id number] DOB: 1971/08/08 Today's Date: 12/13/2023 Time: 0940-1000 SLP Time Calculation (min) (ACUTE ONLY): 20 min  Assessment / Plan / Recommendation Clinical Impression  Skilled therapy session focused on dysphagia goals. Upon entrance, patient with bloody covered suction in oral cavity. Upon investigation, patient seemingly bit interior cheek. RN aware. SLP completed oral care via suction. SLP offered tsp sips of thin liquids and ice chips. Patient with mild anterior spillage of bolus (thin>ice chips), and continues to require cues to initiate mastication. Patient with increasingly timely swallow initiation, though with immediate cough after all PO indicative of possible bolus misdirection. Of note, patient with functional improvements in oral phase this date compared to yesterday. Recommended continuation of NPO w/ alternative means of nutrition, hydration and medication administration.     SLP Plan  Continue with current plan of care      Recommendations for follow up therapy are one component of a multi-disciplinary discharge planning process, led by the attending physician.  Recommendations may be updated based on patient status, additional functional criteria and insurance authorization.    Recommendations  Diet recommendations: NPO Medication Administration: Via alternative means        Oral care QID   Frequent or constant Supervision/Assistance Cognitive communication deficit (R41.841);Dysphagia, unspecified (R13.10)     Continue with current plan of care      Cybele Maule M.A., CCC-SLP 12/13/2023, 10:57 AM

## 2023-12-13 NOTE — Progress Notes (Signed)
 Subjective: No acute events overnight.  Remains afebrile.  ROS: Unable to obtain due to poor mental status  Examination  Vital signs in last 24 hours: Temp:  [97.7 F (36.5 C)-99.8 F (37.7 C)] 99.8 F (37.7 C) (04/10 0735) Pulse Rate:  [84-102] 94 (04/10 0735) Resp:  [15-24] 18 (04/10 0735) BP: (122-151)/(79-100) 149/100 (04/10 0735) SpO2:  [94 %-100 %] 99 % (04/10 0735)  General: lying in bed, NAD Neuro:  Awake, alert, did not answer orientation questions, able to look towards left today and follows commands but has profound left hemineglect, does withdraw to noxious stimuli in left upper and lower extremity  Basic Metabolic Panel: Recent Labs  Lab 12/07/23 0612 12/08/23 0931 12/09/23 0312 12/09/23 0932 12/10/23 0444 12/11/23 0530 12/12/23 0600 12/13/23 0556  NA 145   < > 145  144 145 147* 151* 154* 157*  K 3.8   < > 3.3*  --  3.5 3.4* 3.3* 4.0  CL 109   < > 109  --  113* 119* 124* 129*  CO2 23   < > 23  --  22 19* 17* 15*  GLUCOSE 190*   < > 160*  --  220* 225* 208* 148*  BUN 67*   < > 62*  --  68* 69* 75* 68*  CREATININE 1.60*   < > 1.48*  --  1.53* 1.42* 1.17* 1.03*  CALCIUM 8.4*   < > 8.5*  --  8.6* 9.2 9.0 9.0  MG 2.2  --   --   --   --  2.9* 2.8*  --   PHOS  --   --   --   --   --   --  4.0  --    < > = values in this interval not displayed.    CBC: Recent Labs  Lab 12/08/23 0931 12/10/23 0444 12/11/23 0530 12/12/23 0600 12/13/23 0556  WBC 11.0* 11.6* 14.7* 13.1* 16.3*  HGB 10.2* 9.2* 9.9* 9.5* 10.5*  HCT 31.8* 29.9* 31.5* 31.1* 34.4*  MCV 85.7 89.5 88.7 89.9 90.1  PLT 337 458* 519* 522* 574*     Coagulation Studies: No results for input(s): "LABPROT", "INR" in the last 72 hours.  Imaging No new brain imaging overnight   ASSESSMENT AND PLAN: 53 y.o. female past medical history of alcohol and substance abuse, hypertension, seizures was found wedged between bed and nightstand and seizing for unknown duration.  Brought in after Versed in the  field with resolution of clinical seizure activity.  Obtunded mentation after significant passage of time prompted further workup. MRI brain with cortical changes in the right hemisphere and crossed cerebellar diaschisis, concerning more for a postictal phenomenology. EEG with LPD's likely ictal in nature.  CT angiogram was done-no emergent occlusion. CSF studies bland with no pleocytosis, mildly elevated protein, negative meningitis/encephalitis panel, no organisms on CSF gram stain and no growth on CSF cultures x 5 days. Exam remains poor despite resolution of ictal activity on the EEG with AED adjustments.  Repeat head CT done that shows extensive right hemispheric edema more than expected from postictal from neurology. Repeat MRI brain shows progression of earlier noted unihemispheric edema of the right cerebral hemisphere, which now includes most of the white matter. Completed 3 days of IV solumedrol 1000mg  .     Status epilepticus convulsive, resolved Acute encephalopathy due to seizure Cardiomyopathy with reduced EF -- likely secondary to stress Acute respiratory failure with hypoxia  Fever - Etiology of her cortical unilateral brain  edema remains unclear, still suspect this is post ictal but with progression of edema.  Other differentials include potential autoimmune/paraneoplastic/vasculitic pathology   - Serum Autoimmune encephalopathy panel, serum paraneoplastic encephalopathy panel, Anti TPO thyroglobulin antibodies pending - continue Topamax 100mg  daily, Lamotrigine 250mg  BID, Vimpat 50mg  BID. - Currently on prednisone taper at 60 mg with plan to decrease by 10 mg every week - Neurology to continue to follow   I have spent a total of  27 minutes with the patient reviewing hospital notes,  test results, labs and examining the patient as well as establishing an assessment and plan. > 50% of time was spent in direct patient care.      Lindie Spruce [facility] For questions after 5pm please refer to AMION to reach the Neurologist on call

## 2023-12-13 NOTE — Plan of Care (Signed)
  Problem: Role Relationship: Goal: Method of communication will improve Outcome: Progressing   Problem: Pain Managment: Goal: General experience of comfort will improve and/or be controlled Outcome: Progressing   Problem: Elimination: Goal: Will not experience complications related to urinary retention Outcome: Progressing   Problem: Coping: Goal: Level of anxiety will decrease Outcome: Progressing   Problem: Nutrition: Goal: Adequate nutrition will be maintained Outcome: Progressing   Problem: Education: Goal: Knowledge of General Education information will improve Description: Including pain rating scale, medication(s)/side effects and non-pharmacologic comfort measures Outcome: Not Progressing

## 2023-12-14 ENCOUNTER — Inpatient Hospital Stay (HOSPITAL_COMMUNITY)

## 2023-12-14 DIAGNOSIS — G9340 Encephalopathy, unspecified: Secondary | ICD-10-CM | POA: Diagnosis not present

## 2023-12-14 DIAGNOSIS — R569 Unspecified convulsions: Secondary | ICD-10-CM | POA: Diagnosis not present

## 2023-12-14 DIAGNOSIS — G936 Cerebral edema: Secondary | ICD-10-CM | POA: Diagnosis not present

## 2023-12-14 DIAGNOSIS — G40901 Epilepsy, unspecified, not intractable, with status epilepticus: Secondary | ICD-10-CM | POA: Diagnosis not present

## 2023-12-14 DIAGNOSIS — J9601 Acute respiratory failure with hypoxia: Secondary | ICD-10-CM | POA: Diagnosis not present

## 2023-12-14 LAB — COMPREHENSIVE METABOLIC PANEL WITH GFR
ALT: 66 U/L — ABNORMAL HIGH (ref 0–44)
AST: 78 U/L — ABNORMAL HIGH (ref 15–41)
Albumin: 2.9 g/dL — ABNORMAL LOW (ref 3.5–5.0)
Alkaline Phosphatase: 56 U/L (ref 38–126)
Anion gap: 11 (ref 5–15)
BUN: 54 mg/dL — ABNORMAL HIGH (ref 6–20)
CO2: 14 mmol/L — ABNORMAL LOW (ref 22–32)
Calcium: 8.8 mg/dL — ABNORMAL LOW (ref 8.9–10.3)
Chloride: 125 mmol/L — ABNORMAL HIGH (ref 98–111)
Creatinine, Ser: 1.03 mg/dL — ABNORMAL HIGH (ref 0.44–1.00)
GFR, Estimated: >60 mL/min (ref >60)
Glucose, Bld: 139 mg/dL — ABNORMAL HIGH (ref 70–99)
Potassium: 3.8 mmol/L (ref 3.5–5.1)
Sodium: 150 mmol/L — ABNORMAL HIGH (ref 135–145)
Total Bilirubin: 0.6 mg/dL (ref 0.0–1.2)
Total Protein: 5.6 g/dL — ABNORMAL LOW (ref 6.5–8.1)

## 2023-12-14 LAB — CBC WITH DIFFERENTIAL/PLATELET
Abs Immature Granulocytes: 0.12 10*3/uL — ABNORMAL HIGH (ref 0.00–0.07)
Basophils Absolute: 0 10*3/uL (ref 0.0–0.1)
Basophils Relative: 0 %
Eosinophils Absolute: 0 10*3/uL (ref 0.0–0.5)
Eosinophils Relative: 0 %
HCT: 34.2 % — ABNORMAL LOW (ref 36.0–46.0)
Hemoglobin: 11 g/dL — ABNORMAL LOW (ref 12.0–15.0)
Immature Granulocytes: 1 %
Lymphocytes Relative: 5 %
Lymphs Abs: 0.9 10*3/uL (ref 0.7–4.0)
MCH: 28.2 pg (ref 26.0–34.0)
MCHC: 32.2 g/dL (ref 30.0–36.0)
MCV: 87.7 fL (ref 80.0–100.0)
Monocytes Absolute: 0.6 10*3/uL (ref 0.1–1.0)
Monocytes Relative: 3 %
Neutro Abs: 15.6 10*3/uL — ABNORMAL HIGH (ref 1.7–7.7)
Neutrophils Relative %: 91 %
Platelets: 550 10*3/uL — ABNORMAL HIGH (ref 150–400)
RBC: 3.9 MIL/uL (ref 3.87–5.11)
RDW: 15.8 % — ABNORMAL HIGH (ref 11.5–15.5)
WBC: 17.2 10*3/uL — ABNORMAL HIGH (ref 4.0–10.5)
nRBC: 0 % (ref 0.0–0.2)

## 2023-12-14 LAB — GLUCOSE, CAPILLARY
Glucose-Capillary: 105 mg/dL — ABNORMAL HIGH (ref 70–99)
Glucose-Capillary: 115 mg/dL — ABNORMAL HIGH (ref 70–99)
Glucose-Capillary: 167 mg/dL — ABNORMAL HIGH (ref 70–99)
Glucose-Capillary: 141 mg/dL — ABNORMAL HIGH (ref 70–99)
Glucose-Capillary: 136 mg/dL — ABNORMAL HIGH (ref 70–99)
Glucose-Capillary: 163 mg/dL — ABNORMAL HIGH (ref 70–99)

## 2023-12-14 MED ORDER — POTASSIUM CHLORIDE 10 MEQ/100ML IV SOLN
10.0000 meq | INTRAVENOUS | Status: AC
  Administered 2023-12-14 (×2): 10 meq via INTRAVENOUS
  Filled 2023-12-14 (×2): qty 100

## 2023-12-14 MED ORDER — SODIUM CHLORIDE 0.9 % IV SOLN
3.0000 g | Freq: Four times a day (QID) | INTRAVENOUS | Status: AC
  Administered 2023-12-14 – 2023-12-19 (×20): 3 g via INTRAVENOUS
  Filled 2023-12-14 (×20): qty 8

## 2023-12-14 MED ORDER — FUROSEMIDE 10 MG/ML IJ SOLN
INTRAMUSCULAR | Status: AC
  Administered 2023-12-14: 40 mg
  Filled 2023-12-14: qty 4

## 2023-12-14 MED ORDER — FUROSEMIDE 10 MG/ML IJ SOLN: 40.0000 mg | Freq: Once | INTRAMUSCULAR | Status: AC

## 2023-12-14 MED ORDER — FREE WATER
300.0000 mL | Status: DC
  Administered 2023-12-14 – 2023-12-15 (×4): 300 mL

## 2023-12-14 NOTE — TOC Progression Note (Signed)
 Transition of Care [facility]) - Progression Note    Patient Details  Name: Meredith Park MRN: [id number] Date of Birth: August 06, 1971  Transition of [facility] CM/SW Contact  Eduard Roux, [place] Phone Number: 12/14/2023, 10:58 AM  Clinical Narrative:     TOC following. Will fax out referrals once Cortrak is removed.   Antony Blackbird, MSW, LCSW Clinical Social Worker    Expected Discharge Plan: Skilled Nursing Facility Barriers to Discharge: Continued Medical Work up, English as a second language teacher, SNF Pending bed offer  Expected Discharge Plan and Services In-house Referral: Clinical Social Work   Post Acute Care Choice: Skilled Nursing Facility Living arrangements for the past 2 months: Single Family Home                                       Social Determinants of Health (SDOH) Interventions SDOH Screenings   Food Insecurity: Low Risk  (07/18/2023)   Received from [facility]  Housing: Low Risk  (07/18/2023)   Received from [facility]  Transportation Needs: No Transportation Needs (07/18/2023)   Received from [facility]  Utilities: Low Risk  (07/18/2023)   Received from [facility]  Tobacco Use: Low Risk  (12/11/2023)  Recent Concern: Tobacco Use - Medium Risk (11/05/2023)   Received from [facility]    Readmission Risk Interventions    12/12/2023    3:50 PM  Readmission Risk Prevention Plan  Transportation Screening Complete  Medication Review Oceanographer) Complete  PCP or Specialist appointment within 3-5 days of discharge Complete  HRI or Home Care Consult Complete  SW Recovery Care/Counseling Consult Complete  Palliative Care Screening Not Applicable  Skilled Nursing Facility Complete

## 2023-12-14 NOTE — Progress Notes (Signed)
 PT Cancellation Note  Patient Details Name: Meredith Park MRN: [id number] DOB: Jun 29, 1971   Cancelled Treatment:    Reason Eval/Treat Not Completed: (P) Patient not medically ready. Pt currently with respiratory decline, nurses and MD currently in the room with pt on NRB mask with O2 maxed out. Will plan to follow-up later if able.   Virgil Benedict, PT, DPT Acute Rehabilitation Services  Office: [phone]    Meredith Park 12/14/2023, 10:24 AM

## 2023-12-14 NOTE — Progress Notes (Addendum)
 Subjective: No acute events overnight.  Still minimally verbal.  ROS: Unable to obtain due to poor mental status  Examination  Vital signs in last 24 hours: Temp:  [98.9 F (37.2 C)-99.9 F (37.7 C)] 99.9 F (37.7 C) (04/11 0326) Pulse Rate:  [93-125] 113 (04/11 0409) Resp:  [18-23] 23 (04/11 0409) BP: (106-160)/(77-94) 119/91 (04/11 0326) SpO2:  [93 %-96 %] 93 % (04/11 0409) Weight:  [64.5 kg] 64.5 kg (04/11 0326)  General: lying in bed, NAD Neuro:  Awake, alert, did not answer orientation questions, repeats some words, follows commands but has profound left hemineglect, does withdraw to noxious stimuli in left upper and lower extremity  Basic Metabolic Panel: Recent Labs  Lab 12/10/23 0444 12/11/23 0530 12/12/23 0600 12/13/23 0556 12/14/23 0555  NA 147* 151* 154* 157* 150*  K 3.5 3.4* 3.3* 4.0 3.8  CL 113* 119* 124* 129* 125*  CO2 22 19* 17* 15* 14*  GLUCOSE 220* 225* 208* 148* 139*  BUN 68* 69* 75* 68* 54*  CREATININE 1.53* 1.42* 1.17* 1.03* 1.03*  CALCIUM 8.6* 9.2 9.0 9.0 8.8*  MG  --  2.9* 2.8*  --   --   PHOS  --   --  4.0  --   --     CBC: Recent Labs  Lab 12/10/23 0444 12/11/23 0530 12/12/23 0600 12/13/23 0556 12/14/23 0555  WBC 11.6* 14.7* 13.1* 16.3* 17.2*  NEUTROABS  --   --   --   --  15.6*  HGB 9.2* 9.9* 9.5* 10.5* 11.0*  HCT 29.9* 31.5* 31.1* 34.4* 34.2*  MCV 89.5 88.7 89.9 90.1 87.7  PLT 458* 519* 522* 574* 550*     Coagulation Studies: No results for input(s): "LABPROT", "INR" in the last 72 hours.  Imaging No new brain imaging overnight  ASSESSMENT AND PLAN: 53 y.o. female past medical history of alcohol and substance abuse, hypertension, seizures was found wedged between bed and nightstand and seizing for unknown duration.  Brought in after Versed in the field with resolution of clinical seizure activity.  Obtunded mentation after significant passage of time prompted further workup. MRI brain with cortical changes in the right  hemisphere and crossed cerebellar diaschisis, concerning more for a postictal phenomenology. EEG with LPD's likely ictal in nature.  CT angiogram was done-no emergent occlusion. CSF studies bland with no pleocytosis, mildly elevated protein, negative meningitis/encephalitis panel, no organisms on CSF gram stain and no growth on CSF cultures x 5 days. Exam remains poor despite resolution of ictal activity on the EEG with AED adjustments.  Repeat head CT done that shows extensive right hemispheric edema more than expected from postictal from neurology. Repeat MRI brain shows progression of earlier noted unihemispheric edema of the right cerebral hemisphere, which now includes most of the white matter. Completed 3 days of IV solumedrol 1000mg  .     Status epilepticus convulsive, resolved Acute encephalopathy due to seizure Cardiomyopathy with reduced EF -- likely secondary to stress Acute respiratory failure with hypoxia  Fever - Etiology of her cortical unilateral brain edema remains unclear, still suspect this is post ictal but with progression of edema.  Other differentials include potential autoimmune/paraneoplastic/vasculitic pathology   - Serum Autoimmune encephalopathy panel, serum paraneoplastic encephalopathy panel, Anti TPO thyroglobulin antibodies pending - continue Topamax 100mg  daily, Lamotrigine 250mg  BID, Vimpat 50mg  BID. - Currently on prednisone taper at 60 mg with plan to decrease by 10 mg every week -Okay to gradually normalize sodium -Management of medical comorbidities per primary team -  Neurology to see again on Monday.  Please call us  if you have any questions over the weekend  ADDENDUM - Notified by hospitalist that patient has had waxing and waning mental status with intermittent right arm stiffening. Will repeat Video eeg to look for seizures.   I have spent a total of 25 minutes with the patient reviewing hospital notes,  test results, labs and examining the patient as  well as establishing an assessment and plan. > 50% of time was spent in direct patient care.      Roxy Cordial [facility] For questions after 5pm please refer to AMION to reach the Neurologist on call

## 2023-12-14 NOTE — Progress Notes (Signed)
 eLink Physician-Brief Progress Note Patient Name: Holden Draughon DOB: [birth year] MRN: [id number]   Date of Service  12/14/2023  HPI/Events of Note  Patient needs right wrist restraints to keep her from constantly pulling off her oxygen.   eICU Interventions  Right soft wrist restraints ordered. Enteral nutrition resumed.        Migdalia Dk 12/14/2023, 8:27 PM

## 2023-12-14 NOTE — Progress Notes (Signed)
 Pharmacy Antibiotic Note  Meredith Park is a 53 y.o. female admitted on 11/27/2023 with new concern for aspiration pneumonia.  Pharmacy has been consulted for Unasyn dosing.  Plan: Unasyn 3g q6h.  Follow culture data for de-escalation.  Monitor renal function for dose adjustments as indicated.   Height: 5' 2.01" (157.5 cm) Weight: 64.5 kg (142 lb 3.2 oz) IBW/kg (Calculated) : 50.12  Temp (24hrs), Avg:99.3 F (37.4 C), Min:98.9 F (37.2 C), Max:99.9 F (37.7 C)  Recent Labs  Lab 12/10/23 0444 12/11/23 0530 12/12/23 0600 12/13/23 0556 12/14/23 0555  WBC 11.6* 14.7* 13.1* 16.3* 17.2*  CREATININE 1.53* 1.42* 1.17* 1.03* 1.03*    Estimated Creatinine Clearance: 56.4 mL/min (A) (by C-G formula based on SCr of 1.03 mg/dL (H)).    Allergies  Allergen Reactions   Meperidine Itching   Hydrochlorothiazide Other (See Comments)    Raises ammonia level   Keppra [Levetiracetam] Other (See Comments)    Behavioral, suicidal ideation     Antimicrobials this admission: 4/1 Zosyn >> 4/7 3/26 Ceftriaxone >> 3/31 3/26 Vancomycin >> 3/27 3/26 Ampicillin >> 3/27 3/26 Acyclovir x1   Microbiology results: 3/29 BCx: NGTD x 5d  3/26 CSFCx: NGTD x 3d   3/31 Sputum: normal resp flora    Thank you for allowing pharmacy to be a part of this patient's care.  Estill Batten, PharmD, BCCCP  12/14/2023 12:17 PM

## 2023-12-14 NOTE — Progress Notes (Signed)
 Speech Language Pathology Treatment: Dysphagia  Patient Details Name: Meredith Park MRN: [id number] DOB: 1971-07-03 Today's Date: 12/14/2023 Time: 9528-4132 SLP Time Calculation (min) (ACUTE ONLY): 22 min  Assessment / Plan / Recommendation Clinical Impression   Skilled therapy session focused on dysphagia goals. Patient requiring increased O2 support this date via [place] at 4L. Nurse reporting "wet" lung sounds upon exam this AM. SLP completed oral care via suction. SLP offered tsp sips of thin liquids and ice chips. Patient with x1 instance of mild anterior spillage of bolus (thin>ice chips), and required increased cues to initiate mastication and swallow this date compared to prior. Patient with immediate cough after both ice chips and tsp of thin liquids indicative of possible bolus misdirection. Recommended continuation of NPO w/ alternative means of nutrition, hydration and medication administration due to continued s/sx of aspiration and increased O2 support required this date.     HPI HPI: Meredith Park presenting s/p seizure with vomiting. Admitted with acute encephalopathy, acute respiratory failure, septic shock secondary to aspiration PNA. ETT 3/25-4/2; reintubated for stridor and secretions 4/2-4/7. MRI 4/6: diffuse cortical edema and slight increased vascularity to the  right hemisphere, possibly postictal. PMH: HTN, HLD, ETOH abuse hx, seizure. Previous SLUMS 2023: 26/30 (errors primarily with delayed recall, mild word-finding).      SLP Plan  Continue with current plan of care      Recommendations for follow up therapy are one component of a multi-disciplinary discharge planning process, led by the attending physician.  Recommendations may be updated based on patient status, additional functional criteria and insurance authorization.    Recommendations  Diet recommendations: NPO Medication Administration: Via alternative means                  Oral care QID    Frequent or constant Supervision/Assistance Cognitive communication deficit (R41.841);Dysphagia, unspecified (R13.10)     Continue with current plan of care     [name] M.A., CCC-SLP 12/14/2023, 9:57 AM

## 2023-12-14 NOTE — Progress Notes (Signed)
 OT Cancellation Note  Patient Details Name: Meredith Park MRN: [id number] DOB: 10-11-1970   Cancelled Treatment:    Reason Eval/Treat Not Completed: Medical issues which prohibited therapy- pt with respiratory decline, nursing and MD present with pt on NRB and increased supplemental O2 needs.  Will follow up as able.   Barry Brunner, OT Acute Rehabilitation Services Office [phone] Secure Chat Preferred    Chancy Milroy 12/14/2023, 10:26 AM

## 2023-12-14 NOTE — Progress Notes (Signed)
 LTM EEG hooked up and running - no initial skin breakdown - push button tested - [facility] monitoring.

## 2023-12-14 NOTE — Significant Event (Signed)
 Rapid Response Event Note   Reason for Call :  O2 sats low Per RN had increased O2 needs overnight being placed on [place].  Per Speech therapy note from this am pt with s/s of aspiration during assessment.  Initial Focused Assessment:  Patient is drowsy, she will occasionally follow commands, wiggle toes for example but she does not cough on command.  She moves right arm and leg spontaneously and other times her left arm becomes stiff and unbendable.  When her arm was stiff she did seem to have a left gaze preference. Left arm flaccid, left leg with minimal movement. Mute. She does have a gag and strong cough when stimulated and will spontaneously cough  Lung sounds decreased bases Labored breathing Heart tones regular On NRB O2 sats 91-97% RR 25-40 BP 104/73 Temp 99.6 axillary    Interventions:  Oral care NT suction by RT 40mg  Lasix IV per orders Attempted to wean to HFNC, O2 sats 85%, returned to NRB PCXR done Foley placed by RN   More alert, more restless, pulling at things, gown, foley...  Transferred to ICU  Plan of Care:     Event Summary:   MD Notified: Ghimire at bedside, CCM consulted, Delorise Shiner and Dr Denese Killings at bedside Call Time:  1011 Arrival Time: 1015 End Time: 1145  Marcellina Millin, RN

## 2023-12-14 NOTE — Progress Notes (Addendum)
 NAME:  Meredith Park, MRN:  [id number], DOB:  [birth year], LOS: 17 ADMISSION DATE:  11/27/2023, CONSULTATION DATE: 3/25 REFERRING MD: Dr. Charm Barges EDP, CHIEF COMPLAINT: Seizure  History of Present Illness:  53 year old female with past medical history as below, which is significant for seizure disorder followed at [facility] neurology, alcohol abuse reportedly sober for 1 year, hypertension, marijuana use, and hyperlipidemia.  Seizures are managed with Lamictal and recently added topamax with better control of seizures.  Last seen by neurology 07/19/2023.  Family reports she did have a mixup with her seizure medications a week prior to arrival.  She ran out of one of her medications and did not realize it.  This has been remedied and she is back taking both medications.  The patient was in her usual state of health until 3/25 when she was found down beside her bed by her family at approximately 7 PM.  Last known well around 2:30 PM.  Her mother found her and noticed her leg shaking and she was unresponsive.  EMS was called upon EMS arrival she was noted to have continuous full body seizure as well as vomiting.  She was treated with 10 mg of IM Versed with cessation of seizures.  She remained minimally responsive in the emergency department.  She was hypoxic requiring 10 L of oxygen.  Chest x-ray was unremarkable.  CT of the head and C-spine were unremarkable for acute issues with the exception of findings associated with chronic paranasal sinusitis.  Due to poor responsiveness PCCM was asked to evaluate for ICU admission.  Pertinent  Medical History   has a past medical history of Alcohol abuse, Alcohol withdrawal (HCC) (08/28/2018), High cholesterol, Hypertension, and Seizures (HCC).   Significant Hospital Events: Including procedures, antibiotic start and stop dates in addition to other pertinent events   3/25 admit to ICU. Intubated 3/26 LP performed. Increasing pressor requirement 3/27 Increased  vasopressor requirement with worsening MOD including AKI, shock liver. Echo with stress CM. 3/29 Improved vasopressor requirements 4/2 central line d/c, extubated (had cuff leak), reintubated for stridor & secretions several hours later 4/5 CTH w/progression of unihemispheric edema, thought more c/w seizure changes 4/6 LTM, steroids, 4/7 extubated, intermittently on predex for agitation, LTM d/c'd, tmax 101.6/ WBC stable 4/10 to [facility] 4/11 to ICU   Interim History / Subjective:  Desat this morning   Objective   Blood pressure 104/73, pulse 96, temperature 99.8 F (37.7 C), temperature source Axillary, resp. rate (!) 30, height 5' 2.01" (1.575 m), weight 64.5 kg, last menstrual period 08/28/2020, SpO2 95%.        Intake/Output Summary (Last 24 hours) at 12/14/2023 1134 Last data filed at 12/14/2023 0700 Gross per 24 hour  Intake 1600 ml  Output 2100 ml  Net -500 ml   Filed Weights   12/11/23 0500 12/12/23 0500 12/14/23 0326  Weight: 56.6 kg 63 kg 64.5 kg   Physical Exam: General:  ill appearing adult F  HEENT:[place]. Cortrak secure. NRB in place  Neuro: Awake following RUE commands + cough gag  CV: rrr  PULM:  incr RR and incr WOB.  GI: soft ndnt  Extremities: no acute deformity  Skin: pale c/d/w    Resolved Hospital Problem list   Rhabdomyolysis Thrombocytopenia Septic shock secondary Acute respiratory failure with hypoxia Aspiration pneumonia Post-extubation stridor, delayed  Assessment & Plan:   Acute encephalopathy  Status epilepticus Edema R cerebral hemisphere  -ddx post ictal state vs autoimmune vs paraneoplastic process. Started on steroids  4/6  -imaging c/f prolonged status  P -transfer back to ICU 4/11 for close monitoring -TRH has d/w neuro who will repeat eeg  -AEDs as per neuro -- vimpat, lamictal, topiramate  -sz precautions  -cont steroids; taper neuro  - autoimmune and paraneoplastic encephalopathy panel pending.  anti-TIP thyroglobulin ab  neg  Acute hypoxic respiratory failure Possible aspiration event  -CXR 4/11 without acute abnormality but SLP note w aspiration  -has been on sq heparin; PE less likely but still possible given length of stay + immobility  P:  -txf back to ICU -affirmed w pt mom plan to reintubate if resp status/airway protection declines  -BLE ultrasound   Hx etoh abuse -sober x 1 yr P -micronutrient support   Cardiomyopathy, suspect stress -LVEF 40-45% + LV WMA  History of hypertension P:  - coreg, losartan  -lasix 4/11  AKI NAGMA Hypernatremia  P - cont FWF  -follow renal indices uop   Anemia Thrombocytosis  -critical illness -- iatrogenic losses -reactive  P - stable, no evidence of bleeding.   - trend CBC  Hyperglycemia due to steroids - SSI  Leukocytosis -steroids  -will start unasyn 4/11 in light of desat + SLP note re aspirating, though cxr ok.   Elevated LFTs -follow PRN   Severe malnutrition Dysphagia - EN per cortrak/ RD, appreciate assistance. Otherwise NPO   DTPI- buttock 2/2 to FMS - supportive care/ turns, nutrition  Best Practice (right click and "Reselect all SmartList Selections" daily)   Diet/type: tubefeeds DVT prophylaxis prophylactic heparin    Pressure ulcer(s): N/A GI prophylaxis: PPI while on steroids Lines: RUE PICC>  Foley:  N/A Code Status:  full code Last date of multidisciplinary goals of care discussion [d/w mom 4/11 re transfer back to ICU. Affirms full code status at this time     Critical care time:     CRITICAL CARE Performed by: Lanier Clam   Total critical care time: 38 minutes  Critical care time was exclusive of separately billable procedures and treating other patients. Critical care was necessary to treat or prevent imminent or life-threatening deterioration.  Critical care was time spent personally by me on the following activities: development of treatment plan with patient and/or surrogate as well as  nursing, discussions with consultants, evaluation of patient's response to treatment, examination of patient, obtaining history from patient or surrogate, ordering and performing treatments and interventions, ordering and review of laboratory studies, ordering and review of radiographic studies, pulse oximetry and re-evaluation of patient's condition.  Tessie Fass MSN, AGACNP-BC [facility] Pulmonary/Critical Care Medicine Amion for pager  12/14/2023, 11:34 AM

## 2023-12-14 NOTE — Progress Notes (Signed)
 Upon arrival to patient's room this morning she was satting at 90% on 2L [place] and this nurse bumped her up to 3L. Night shift nurse, Loraine Leriche, said that patient was desatting and he put her on the nasal cannular aroun 0630 (same time she had pulled out her foley and flexiseal). Around 0935 while this nurse was giving her morning meds, bumped her up to 4L [place]. She was tachypneic (RR in the low 30s) and tachycardic (in the low 120s) and upper lungs sounding rhonchi. Per Dr. Jerral Ralph, to see if her scheduled meds and letting her rest will help. Throughout working with SLP and me giving meds & doing my assessment, she was starting to require more oxygen.  1005: paged RT & they promptly responded to bedside. Patient's sats still downtrending to the low 80s on max [place] flow. Pt starting become more drowsy. 1007: pt placed on non-rebreather, paged rapid & Dr. Jerral Ralph and arrived at bedside shortly after. Pt initally satting in the high 80s on non-rebreather. RT preformed NTS with some improvement in saturation to the low to mid 90s. Stat CXR ordered 1027: this nurse gave 40mg  lasix IVP verbal order given from Dr. Jerral Ralph 1045: Foley placed verbal order from Dr. Jerral Ralph 1105: CCMD at bedside and will place transfer orders to ICU 1130: report given to ICU RN & transferred to room 4N-15

## 2023-12-14 NOTE — Progress Notes (Signed)
 PROGRESS NOTE    Meredith Park  FAO:[id number] DOB: 01/14/1971 DOA: 11/27/2023 PCP: Burnis Medin, PA-C    Brief Narrative:  53 year old with history of previous alcohol abuse and alcohol withdrawal, hypertension, hyperlipidemia and seizure disorder.  Abstinence from alcohol for last 1 year.  Patient was found down beside her bed by her family members.  Noted to be shaking and was unresponsive.  EMS was called, she was found to be in generalized tonic-clonic seizure.  She was treated with Versed with cessation of seizures.  She remained minimally responsive in the emergency department.  Hypoxemic requiring 10 L of oxygen.  Patient was intubated to protect airway and admitted to the ICU.    Significant events as below. 3/25 admit to ICU.  Hypoxemic and unresponsive.  Intubated 3/26 LP performed. Increasing pressor requirement 3/27 Increased vasopressor requirement with worsening MOD including AKI, shock liver. Echo with stress CM. 3/29 Improved vasopressor requirements 4/2 central line d/c, extubated (had cuff leak), reintubated for stridor & secretions several hours later 4/5 CTH w/progression of unihemispheric edema, thought more c/w seizure changes 4/6 LTM, steroids, 4/7 extubated, intermittently on predex for agitation, LTM d/c'd, tmax 101.6/ WBC stable 4/10 on room air.  Transferred to medical floor.  Core track feeding. 4/11 full of secretions unable to clear , requiring non- rebreather   Significant studies: Brain MRI 3/28 with extensive cortical restricted diffusion in the right cerebral hemisphere Brain MRI 4/6 with diffuse cortical edema and increased vascularity to the right hemisphere Spinal tap, no evidence of infection. EEG, postictal.    Subjective: Patient seen and examined in the morning rounds.  She was slightly tachypneic and tachycardic but able to maintain on 1 to 2 L of oxygen.  Mental status fluctuating.  In and out.  Difficult to follow commands but  she will use her right hand.  Ultimately, patient started having hypoxemic episodes.  She had plenty of secretions.   Seen with rapid response at the bedside.  Patient on nonrebreather.  Unable to expectorate sputum.  Deep suctioning done, chest physiotherapy provided but patient is still on nonrebreather.  Mental status fluctuating.  Patient is not a candidate for BiPAP therapy.  High risk for reintubation. X-ray was obtained that was essentially normal. Case discussed with critical care and neurology.  Patient transferred back to ICU for close monitoring.    Assessment & Plan:   Status epilepticus with history of seizure disorder Acute encephalopathy secondary to status epilepticus  hemispheric edema of the right cerebral hemisphere consistent with postictal state Acute respiratory failure with hypoxemia, respiratory insufficiency secondary to unresponsiveness and status epilepticus.  Patient was on mechanical ventilation, reintubated and was extubated to room air.  Patient started desaturating, suspect aspiration and poor airway clearance.  Patient with poor mental status.  Unable to participate in chest physiotherapy.  4 /11, transferred back to ICU.  May need reintubation.   With altered mental status, neurology continues to follow.  Currently on Vimpat, Lamictal and Topamax. High-dose steroids with slow taper  for cerebral edema. Initial suspicion for paraneoplastic syndrome, serology pending.  CT scan of the chest abdomen pelvis was without any concern for malignancy.  Paraneoplastic serology and autoimmune serologies are pending. Continue seizure precautions.  As needed Ativan for seizure. Continue to work with PT OT and speech.  SIRS: Patient had low-grade fever and leukocytosis.  Doppler studies were negative.  There was no evidence of bacterial infection.  Did not receive any antibiotics.  Suspected metabolic fever.  Will continue to monitor. Starting on Unasyn today.  New onset  cardiomyopathy with ejection fraction 40 to 45%, left ventricular wall motion abnormality and troponin elevation: Thought to be demand ischemia due to seizure and stress.  Does have history of hypertension.  Seen by cardiology.  Recommended conservative management. Patient currently on carvedilol, losartan, intermittent diuresis.  Clonidine taper.  Acute kidney injury secondary to rhabdomyolysis, ATN, metabolic acidosis: Renal functions improved.    Hyperchloremic hypernatremia: Sodium goal 150-156 as per neurology and critical care.  Increase free water flush with improvement of sodium to 150.  Decrease to 300 mL every 4 hours.  Potassium and magnesium adequate.   Transaminitis: Due to #1.  Trending down.  Hyperglycemia due to steroids and tube feeding: Covered by insulin.  Severe malnutrition and dysphagia: Secondary to encephalopathy.  Currently not ready to swallow.  Continue tube feeding.  Will probably need PEG tube placement if unable to eat.      DVT prophylaxis: heparin injection 5,000 Units Start: 12/04/23 0930 SCDs Start: 11/27/23 2310   Code Status: Full code Family Communication: Friend at the bedside Disposition Plan: Status is: Inpatient Remains inpatient appropriate because: Altered mental status, tube feeding     Consultants:  Neurology Cardiology Critical care  Procedures:  Intubation and extubation Spinal tap  Antimicrobials:  None     Objective: Vitals:   12/14/23 0000 12/14/23 0326 12/14/23 0409 12/14/23 0804  BP:  (!) 119/91  116/85  Pulse: 100 (!) 125 (!) 113 (!) 121  Resp: 18 20 (!) 23 (!) 25  Temp:  99.9 F (37.7 C)  99.4 F (37.4 C)  TempSrc:  Oral  Oral  SpO2: 96% 95% 93% 93%  Weight:  64.5 kg    Height:        Intake/Output Summary (Last 24 hours) at 12/14/2023 1033 Last data filed at 12/14/2023 0700 Gross per 24 hour  Intake 1600 ml  Output 2100 ml  Net -500 ml   Filed Weights   12/11/23 0500 12/12/23 0500 12/14/23 0326   Weight: 56.6 kg 63 kg 64.5 kg    Examination:  General exam: Appeared anxious.  In moderate distress.  Tachypneic and tachycardic. Respiratory system: Acute upper airway sounds.  Tachypneic.  Clear air entry.  On nonrebreather. SpO2: 98 % O2 Flow Rate (L/min): 15 L/min FiO2 (%): 30 %  Cardiovascular system: S1 & S2 heard, RRR.  Tachycardic. Gastrointestinal system: Soft.  Nontender. Central nervous system: Alert and awake at times.  Difficult to understand.  She follows simple commands on the right side.  She moves right side of the upper and lower extremities with commands.  She has dense hemiplegia on the left side with neglect.    Data Reviewed: I have personally reviewed following labs and imaging studies  CBC: Recent Labs  Lab 12/10/23 0444 12/11/23 0530 12/12/23 0600 12/13/23 0556 12/14/23 0555  WBC 11.6* 14.7* 13.1* 16.3* 17.2*  NEUTROABS  --   --   --   --  15.6*  HGB 9.2* 9.9* 9.5* 10.5* 11.0*  HCT 29.9* 31.5* 31.1* 34.4* 34.2*  MCV 89.5 88.7 89.9 90.1 87.7  PLT 458* 519* 522* 574* 550*   Basic Metabolic Panel: Recent Labs  Lab 12/10/23 0444 12/11/23 0530 12/12/23 0600 12/13/23 0556 12/14/23 0555  NA 147* 151* 154* 157* 150*  K 3.5 3.4* 3.3* 4.0 3.8  CL 113* 119* 124* 129* 125*  CO2 22 19* 17* 15* 14*  GLUCOSE 220* 225* 208* 148* 139*  BUN 68*  69* 75* 68* 54*  CREATININE 1.53* 1.42* 1.17* 1.03* 1.03*  CALCIUM 8.6* 9.2 9.0 9.0 8.8*  MG  --  2.9* 2.8*  --   --   PHOS  --   --  4.0  --   --    GFR: Estimated Creatinine Clearance: 56.4 mL/min (A) (by C-G formula based on SCr of 1.03 mg/dL (H)). Liver Function Tests: Recent Labs  Lab 12/08/23 0931 12/11/23 0530 12/14/23 0555  AST 136* 82* 78*  ALT 166* 106* 66*  ALKPHOS 55 47 56  BILITOT 0.7 0.6 0.6  PROT 6.1* 6.0* 5.6*  ALBUMIN 3.1* 3.0* 2.9*   No results for input(s): "LIPASE", "AMYLASE" in the last 168 hours. Recent Labs  Lab 12/08/23 1923  AMMONIA 26   Coagulation Profile: No results  for input(s): "INR", "PROTIME" in the last 168 hours. Cardiac Enzymes: No results for input(s): "CKTOTAL", "CKMB", "CKMBINDEX", "TROPONINI" in the last 168 hours. BNP (last 3 results) No results for input(s): "PROBNP" in the last 8760 hours. HbA1C: No results for input(s): "HGBA1C" in the last 72 hours. CBG: Recent Labs  Lab 12/13/23 1605 12/13/23 1945 12/13/23 2314 12/14/23 0327 12/14/23 0802  GLUCAP 151* 150* 139* 105* 115*   Lipid Profile: No results for input(s): "CHOL", "HDL", "LDLCALC", "TRIG", "CHOLHDL", "LDLDIRECT" in the last 72 hours. Thyroid Function Tests: No results for input(s): "TSH", "T4TOTAL", "FREET4", "T3FREE", "THYROIDAB" in the last 72 hours. Anemia Panel: No results for input(s): "VITAMINB12", "FOLATE", "FERRITIN", "TIBC", "IRON", "RETICCTPCT" in the last 72 hours. Sepsis Labs: Recent Labs  Lab 12/10/23 0444 12/12/23 0600  PROCALCITON 0.54 0.29    No results found for this or any previous visit (from the past 240 hours).        Radiology Studies: No results found.       Scheduled Meds:  acetaminophen  650 mg Per Tube Q6H   carvedilol  12.5 mg Per Tube BID WC   Chlorhexidine Gluconate Cloth  6 each Topical Daily   feeding supplement (PROSource TF20)  60 mL Per Tube BID   fiber supplement (BANATROL TF)  60 mL Per Tube BID   folic acid  1 mg Per Tube Daily   free water  300 mL Per Tube Q4H   furosemide  40 mg Intravenous Once   heparin injection (subcutaneous)  5,000 Units Subcutaneous Q8H   insulin aspart  0-9 Units Subcutaneous Q4H   insulin aspart  3 Units Subcutaneous Q4H   lacosamide  50 mg Per Tube BID   lamoTRIgine  250 mg Per Tube BID   losartan  25 mg Per Tube BID   methocarbamol  1,000 mg Per Tube TID   multivitamin with minerals  1 tablet Per Tube Daily   nystatin cream   Topical BID   mouth rinse  15 mL Mouth Rinse Q4H   pantoprazole (PROTONIX) IV  40 mg Intravenous QHS   predniSONE  60 mg Per Tube Q breakfast    Followed by   Melene Muller ON 12/18/2023] predniSONE  50 mg Oral Q breakfast   Followed by   Melene Muller ON 12/25/2023] predniSONE  40 mg Oral Q breakfast   Followed by   Melene Muller ON 01/01/2024] predniSONE  30 mg Oral Q breakfast   Followed by   Melene Muller ON 01/08/2024] predniSONE  20 mg Oral Q breakfast   Followed by   Melene Muller ON 01/15/2024] predniSONE  10 mg Oral Q breakfast   QUEtiapine  50 mg Per Tube QHS   sodium chloride flush  10-40 mL Intracatheter Q12H   thiamine  100 mg Per Tube Daily   topiramate  100 mg Per Tube QHS   Continuous Infusions:  feeding supplement (OSMOLITE 1.5 CAL) 50 mL/hr at 12/14/23 0700     LOS: 17 days    Time spent: 55 minutes    Dorcas Carrow, MD [facility]

## 2023-12-15 ENCOUNTER — Inpatient Hospital Stay (HOSPITAL_COMMUNITY)

## 2023-12-15 DIAGNOSIS — G40909 Epilepsy, unspecified, not intractable, without status epilepticus: Secondary | ICD-10-CM | POA: Diagnosis not present

## 2023-12-15 DIAGNOSIS — G40901 Epilepsy, unspecified, not intractable, with status epilepticus: Secondary | ICD-10-CM | POA: Diagnosis not present

## 2023-12-15 DIAGNOSIS — G936 Cerebral edema: Secondary | ICD-10-CM | POA: Diagnosis not present

## 2023-12-15 DIAGNOSIS — G9340 Encephalopathy, unspecified: Secondary | ICD-10-CM | POA: Diagnosis not present

## 2023-12-15 LAB — GLUCOSE, CAPILLARY
Glucose-Capillary: 104 mg/dL — ABNORMAL HIGH (ref 70–99)
Glucose-Capillary: 132 mg/dL — ABNORMAL HIGH (ref 70–99)
Glucose-Capillary: 150 mg/dL — ABNORMAL HIGH (ref 70–99)
Glucose-Capillary: 187 mg/dL — ABNORMAL HIGH (ref 70–99)
Glucose-Capillary: 124 mg/dL — ABNORMAL HIGH (ref 70–99)
Glucose-Capillary: 114 mg/dL — ABNORMAL HIGH (ref 70–99)

## 2023-12-15 LAB — BASIC METABOLIC PANEL WITH GFR
Anion gap: 13 (ref 5–15)
BUN: 58 mg/dL — ABNORMAL HIGH (ref 6–20)
CO2: 15 mmol/L — ABNORMAL LOW (ref 22–32)
Calcium: 8.7 mg/dL — ABNORMAL LOW (ref 8.9–10.3)
Chloride: 122 mmol/L — ABNORMAL HIGH (ref 98–111)
Creatinine, Ser: 1.07 mg/dL — ABNORMAL HIGH (ref 0.44–1.00)
GFR, Estimated: >60 mL/min (ref >60)
Glucose, Bld: 140 mg/dL — ABNORMAL HIGH (ref 70–99)
Potassium: 3.7 mmol/L (ref 3.5–5.1)
Sodium: 150 mmol/L — ABNORMAL HIGH (ref 135–145)

## 2023-12-15 LAB — BRAIN NATRIURETIC PEPTIDE: B Natriuretic Peptide: 447.9 pg/mL — ABNORMAL HIGH (ref 0.0–100.0)

## 2023-12-15 MED ORDER — IOHEXOL 350 MG/ML SOLN
75.0000 mL | Freq: Once | INTRAVENOUS | Status: AC | PRN
  Administered 2023-12-15: 75 mL via INTRAVENOUS

## 2023-12-15 MED ORDER — POTASSIUM CHLORIDE 20 MEQ PO PACK
40.0000 meq | PACK | Freq: Once | ORAL | Status: AC
  Administered 2023-12-15: 40 meq
  Filled 2023-12-15: qty 2

## 2023-12-15 MED ORDER — FREE WATER: 300.0000 mL | Status: DC

## 2023-12-15 MED ORDER — FREE WATER
300.0000 mL | Status: DC
  Administered 2023-12-15 – 2023-12-18 (×19): 300 mL

## 2023-12-15 MED ORDER — FUROSEMIDE 10 MG/ML IJ SOLN
40.0000 mg | Freq: Once | INTRAMUSCULAR | Status: AC
  Administered 2023-12-15: 40 mg via INTRAVENOUS
  Filled 2023-12-15: qty 4

## 2023-12-15 NOTE — Procedures (Signed)
 Patient Name: Meredith Park  MRN: [id number]  Epilepsy Attending: Arleene Lack  Referring Physician/Provider: Roxy Cordial MD  Duration: 12/14/2023 1316 to 12/15/2023 1316   Patient history: 53 y.o. female with hx of alcohol and substance use, Htn, seizures on lamotrigine and topamax who was found seizing for unknown duration. EEG to evaluate for seizure   Level of alertness:  lethargic , asleep   AEDs during EEG study: LTG, TPM, LCM   Technical aspects: This EEG study was done with scalp electrodes positioned according to the 10-20 International system of electrode placement. Electrical activity was reviewed with band pass filter of 1-70Hz , sensitivity of 7 uV/mm, display speed of 70mm/sec with a 60Hz  notched filter applied as appropriate. EEG data were recorded continuously and digitally stored.  Video monitoring was available and reviewed as appropriate.   Description: EEG showed continuous low amplitude 2-3Hz  delta slowing in right fronto-temporal region.  EEG also showed 3-7hz  theta-delta slowing in left hemisphere. Sharp waves were noted in left frontotemporal region. Sleep was characterized by vertex waves, sleep spindles (12 to 14 Hz), maximal frontocentral region. Hyperventilation and photic stimulation were not performed.     Event button was pressed on 12/14/2023 at 1450 for right gaze deviation and waving right arm. Concomitant EEG before, during and after the event didn't show any EEG change to suggest seizure.    ABNORMALITY -Sharp wave, left frontotemporal region - Continuous slow, generalized and maximal right fronto-temporal region    IMPRESSION: This study showed evidence of epileptogenicity arising from left frontotemporal region. Additionally there is cortical dysfunction arising from right fronto-temporal region likely secondary to underlying structural abnormality. Lastly there is moderate diffuse encephalopathy. No seizures were noted.  Event button was  pressed on 12/14/2023 at 1450 for right gaze deviation and waving right arm without concomitant EEG change. This was not an epileptic event.    Leiyah Maultsby O Shyteria Lewis

## 2023-12-15 NOTE — Progress Notes (Signed)
 eLink Physician-Brief Progress Note Patient Name: Meredith Park DOB: August 29, 1971 MRN: [id number]   Date of Service  12/15/2023  HPI/Events of Note  Patient needs right wrist restraints to keep her from constantly pulling off her oxygen.   eICU Interventions  Right soft wrist restraints renewed.        Yonis Carreon 12/15/2023, 11:21 PM

## 2023-12-15 NOTE — Progress Notes (Signed)
 Pharmacy Electrolyte Replacement  Recent Labs:  Recent Labs    12/15/23 0740  K 3.7  CREATININE 1.07*    Low Critical Values (K </= 2.5, Phos </= 1, Mg </= 1) Present: None  MD Contacted: n/a  Assessment: s/p Lasix 40 x1 on 4/11. Receiving another Lasix 40 x1 today.   Plan: KCl 40mEq per tube x1.  Repeat in AM.   Lenard Quam, PharmD, BCPS, BCCCP Please refer to [facility] for [facility] numbers 12/15/2023, 11:12 AM

## 2023-12-15 NOTE — Progress Notes (Signed)
 Speech Language Pathology Treatment: Dysphagia  Patient Details Name: Meredith Park MRN: [id number] DOB: 16-Apr-1971 Today's Date: 12/15/2023 Time: 4098-1191 SLP Time Calculation (min) (ACUTE ONLY): 9 min  Assessment / Plan / Recommendation Clinical Impression  Pt seen for PO trials. The pt was upright and alert, nursing present in room t/o treatment. RN reports recent oral care, oral cavity clean for PO intake. The pt was presented with ice chips and teaspoon sips of thin liquid. The pt continued to have mild-moderate right anterior loss of the bolus, however the pt did attempt to masticate the ice chips twice and had a singular observable attempt to swallow the ice chip, with some mild hyolaryngeal excursion observed. She also slightly nodded her head yes when asked if she would like another ice chip. The pt had increasing s/s of aspiration (productive coughing) with progressing trials, PO intake ceased to reduce aspiration risk. Pt still not appropriate for MBS at this time given cognitive status, SLP to f/u closely to determine readiness and for continued PO trials.   HPI HPI: 53 yo female presenting s/p seizure with vomiting. Admitted with acute encephalopathy, acute respiratory failure, septic shock secondary to aspiration PNA. ETT 3/25-4/2; reintubated for stridor and secretions 4/2-4/7. MRI 4/6: diffuse cortical edema and slight increased vascularity to the  right hemisphere, possibly postictal. PMH: HTN, HLD, ETOH abuse hx, seizure. Previous SLUMS 2023: 26/30 (errors primarily with delayed recall, mild word-finding).      SLP Plan  Continue with current plan of care      Recommendations for follow up therapy are one component of a multi-disciplinary discharge planning process, led by the attending physician.  Recommendations may be updated based on patient status, additional functional criteria and insurance authorization.    Recommendations  Diet recommendations: NPO                   Oral care QID   Frequent or constant Supervision/Assistance Cognitive communication deficit (R41.841);Dysphagia, unspecified (R13.10)     Continue with current plan of care     Eda Gone M.S. CCC-SLP

## 2023-12-15 NOTE — Progress Notes (Signed)
 NAME:  Meredith Park, MRN:  [id number], DOB:  [birth year], LOS: 18 ADMISSION DATE:  11/27/2023, CONSULTATION DATE: 3/25 REFERRING MD: Dr. Randal Bury EDP, CHIEF COMPLAINT: Seizure  History of Present Illness:  53 year old female with past medical history as below, which is significant for seizure disorder followed at [facility] neurology, alcohol abuse reportedly sober for 1 year, hypertension, marijuana use, and hyperlipidemia.  Seizures are managed with Lamictal and recently added topamax with better control of seizures.  Last seen by neurology 07/19/2023.  Family reports she did have a mixup with her seizure medications a week prior to arrival.  She ran out of one of her medications and did not realize it.  This has been remedied and she is back taking both medications.  The patient was in her usual state of health until 3/25 when she was found down beside her bed by her family at approximately 7 PM.  Last known well around 2:30 PM.  Her mother found her and noticed her leg shaking and she was unresponsive.  EMS was called upon EMS arrival she was noted to have continuous full body seizure as well as vomiting.  She was treated with 10 mg of IM Versed with cessation of seizures.  She remained minimally responsive in the emergency department.  She was hypoxic requiring 10 L of oxygen.  Chest x-ray was unremarkable.  CT of the head and C-spine were unremarkable for acute issues with the exception of findings associated with chronic paranasal sinusitis.  Due to poor responsiveness PCCM was asked to evaluate for ICU admission.  Pertinent  Medical History   has a past medical history of Alcohol abuse, Alcohol withdrawal (HCC) (08/28/2018), High cholesterol, Hypertension, and Seizures (HCC).   Significant Hospital Events: Including procedures, antibiotic start and stop dates in addition to other pertinent events   3/25 admit to ICU. Intubated 3/26 LP performed. Increasing pressor requirement 3/27 Increased  vasopressor requirement with worsening MOD including AKI, shock liver. Echo with stress CM. 3/29 Improved vasopressor requirements 4/2 central line d/c, extubated (had cuff leak), reintubated for stridor & secretions several hours later 4/5 CTH w/progression of unihemispheric edema, thought more c/w seizure changes 4/6 LTM, steroids, 4/7 extubated, intermittently on predex for agitation, LTM d/c'd, tmax 101.6/ WBC stable 4/10 to TRH 4/11 to ICU   Interim History / Subjective:  Unable to come off HHFNC. Neurological status remains unchanged.   Objective   Blood pressure 91/63, pulse 90, temperature 98.5 F (36.9 C), temperature source Axillary, resp. rate 16, height 5' 2.01" (1.575 m), weight 63.1 kg, last menstrual period 08/28/2020, SpO2 96%.    FiO2 (%):  [50 %-70 %] 50 %   Intake/Output Summary (Last 24 hours) at 12/15/2023 1030 Last data filed at 12/15/2023 0900 Gross per 24 hour  Intake 2020.05 ml  Output 2325 ml  Net -304.95 ml   Filed Weights   12/12/23 0500 12/14/23 0326 12/15/23 0500  Weight: 63 kg 64.5 kg 63.1 kg   Physical Exam: General:  ill appearing adult woman of average build HEENT:[place]. [place] secure. On HHFNC Neuro: Awake and follows commands briskly on the right side but no movement even to pain on the left. Tries to talk but unable. CV: HS normal and extremities warm. No JVD PULM: mildly increased WOB. Crackles at bases only L>R GI: Soft.   Ancillary Tests:   Na 150, hyperchloremic acidosis.  Creatinine 1.07 WBC 17.2, HB 11, PLT 550 CT negative for PE, BNP 447 Assessment & Plan:   Acute  encephalopathy  Status epilepticus Edema R cerebral hemisphere secondary to seizure changes.  Acute hypoxic respiratory failure due to possible aspiration event  Hx etoh abuse Suspected stress cardiomyopathy - EF 40% History of hypertension AKI - resolved Hypernatremia  Anemia Thrombocytosis  Hyperglycemia due to steroids Leukocytosis Elevated LFTs Severe  malnutrition Dysphagia DTPI- buttock 2/2 to FMS  Plan:  - Continue to titrate/wean HHFNC to keep saturation >92% - Consolidation at the bases due to aspiration and poor cough mechanics.  - Continue free water at current dose - keep Sodium around 150 to mitigate cerebral edema. - Continue steroid taper for vasculitis? Treatment - Trial of diuresis - follow creatinine.  - Not wheezing. Continue current antibiotics for 5 days.  - Continue current anticonvulsants.  - Leukocytosis and thrombocytosis are stable - may reflect chronic microaspiration and steroids.  - Continue tube feeds.   Best Practice (right click and "Reselect all SmartList Selections" daily)   Diet/type: tubefeeds DVT prophylaxis prophylactic heparin    Pressure ulcer(s): N/A GI prophylaxis: PPI while on steroids Lines: RUE PICC>  Foley:  N/A Code Status:  full code Last date of multidisciplinary goals of care discussion [d/w mom 4/11 re transfer back to ICU. Affirms full code status at this time   CRITICAL CARE Performed by: Arlina Lair   Total critical care time: 45 minutes  Critical care time was exclusive of separately billable procedures and treating other patients.  Critical care was necessary to treat or prevent imminent or life-threatening deterioration.  Critical care was time spent personally by me on the following activities: development of treatment plan with patient and/or surrogate as well as nursing, discussions with consultants, evaluation of patient's response to treatment, examination of patient, obtaining history from patient or surrogate, ordering and performing treatments and interventions, ordering and review of laboratory studies, ordering and review of radiographic studies, pulse oximetry, re-evaluation of patient's condition and participation in multidisciplinary rounds.  Arlina Lair, MD [facility] ICU Physician [facility] Critical [facility]  Pager: [phone] Mobile: [phone] After  hours: [phone].

## 2023-12-15 NOTE — Progress Notes (Signed)
 Subjective: Moved to ICU due to increased O2 requirement  ROS: Unable to obtain due to poor mental status  Examination  Vital signs in last 24 hours: Temp:  [97.4 F (36.3 C)-98.8 F (37.1 C)] 97.4 F (36.3 C) (04/12 1200) Pulse Rate:  [68-102] 79 (04/12 1230) Resp:  [11-27] 24 (04/12 1230) BP: (77-160)/(53-114) 92/68 (04/12 1230) SpO2:  [87 %-100 %] 100 % (04/12 1230) FiO2 (%):  [40 %-70 %] 40 % (04/12 1128) Weight:  [63.1 kg] 63.1 kg (04/12 0500)  General: lying in bed, NAD Neuro:  Awake, alert, did not answer orientation questions, repeats some words, follows commands but has profound left hemineglect, does withdraw to noxious stimuli in left upper and lower extremity  Basic Metabolic Panel: Recent Labs  Lab 12/11/23 0530 12/12/23 0600 12/13/23 0556 12/14/23 0555 12/15/23 0740  NA 151* 154* 157* 150* 150*  K 3.4* 3.3* 4.0 3.8 3.7  CL 119* 124* 129* 125* 122*  CO2 19* 17* 15* 14* 15*  GLUCOSE 225* 208* 148* 139* 140*  BUN 69* 75* 68* 54* 58*  CREATININE 1.42* 1.17* 1.03* 1.03* 1.07*  CALCIUM 9.2 9.0 9.0 8.8* 8.7*  MG 2.9* 2.8*  --   --   --   PHOS  --  4.0  --   --   --     CBC: Recent Labs  Lab 12/10/23 0444 12/11/23 0530 12/12/23 0600 12/13/23 0556 12/14/23 0555  WBC 11.6* 14.7* 13.1* 16.3* 17.2*  NEUTROABS  --   --   --   --  15.6*  HGB 9.2* 9.9* 9.5* 10.5* 11.0*  HCT 29.9* 31.5* 31.1* 34.4* 34.2*  MCV 89.5 88.7 89.9 90.1 87.7  PLT 458* 519* 522* 574* 550*     Coagulation Studies: No results for input(s): "LABPROT", "INR" in the last 72 hours.  Imaging No new brain imaging overnight   ASSESSMENT AND PLAN: 53 y.o. female past medical history of alcohol and substance abuse, hypertension, seizures was found wedged between bed and nightstand and seizing for unknown duration.  Brought in after Versed in the field with resolution of clinical seizure activity.  Obtunded mentation after significant passage of time prompted further workup. MRI brain with  cortical changes in the right hemisphere and crossed cerebellar diaschisis, concerning more for a postictal phenomenology. EEG with LPD's likely ictal in nature.  CT angiogram was done-no emergent occlusion. CSF studies bland with no pleocytosis, mildly elevated protein, negative meningitis/encephalitis panel, no organisms on CSF gram stain and no growth on CSF cultures x 5 days. Exam remains poor despite resolution of ictal activity on the EEG with AED adjustments.  Repeat head CT done that shows extensive right hemispheric edema more than expected from postictal from neurology. Repeat MRI brain shows progression of earlier noted unihemispheric edema of the right cerebral hemisphere, which now includes most of the white matter. Completed 3 days of IV solumedrol 1000mg  .     Status epilepticus convulsive, resolved Acute encephalopathy due to post ictal state -No seizures on EEG, will continue for another day and if negative, likely dc - Etiology of her cortical unilateral brain edema remains unclear, still suspect this is post ictal but with progression of edema.  Other differentials include potential autoimmune/paraneoplastic/vasculitic pathology   - Serum Autoimmune encephalopathy panel, serum paraneoplastic encephalopathy panel, Anti TPO thyroglobulin antibodies pending - continue Topamax 100mg  daily, Lamotrigine 250mg  BID - DC Vimpat to minimize sedation while on EEG - Currently on prednisone taper at 60 mg with plan to decrease by  10 mg every week -Okay to gradually normalize sodium - Plan to repeat MRI next week to assess for any change -Management of medical comorbidities per primary team - Neurology to see again on Monday.  Please call us  if you have any questions over the weekend  I have spent a total of 36 minutes with the patient reviewing hospital notes,  test results, labs and examining the patient as well as establishing an assessment and plan. > 50% of time was spent in direct patient  care.    Roxy Cordial Epilepsy [facility] For questions after 5pm please refer to AMION to reach the Neurologist on call

## 2023-12-15 NOTE — Plan of Care (Signed)
  Problem: Clinical Measurements: Goal: Ability to maintain clinical measurements within normal limits will improve Outcome: Progressing Goal: Respiratory complications will improve Outcome: Progressing   Problem: Clinical Measurements: Goal: Respiratory complications will improve Outcome: Progressing   Problem: Nutrition: Goal: Adequate nutrition will be maintained Outcome: Progressing

## 2023-12-15 NOTE — Progress Notes (Signed)
 LTM maint complete - no skin breakdown under: Fz, Pz, O1

## 2023-12-16 ENCOUNTER — Inpatient Hospital Stay (HOSPITAL_COMMUNITY)

## 2023-12-16 DIAGNOSIS — G40901 Epilepsy, unspecified, not intractable, with status epilepticus: Secondary | ICD-10-CM | POA: Diagnosis not present

## 2023-12-16 DIAGNOSIS — R569 Unspecified convulsions: Secondary | ICD-10-CM | POA: Diagnosis not present

## 2023-12-16 LAB — GLUCOSE, CAPILLARY
Glucose-Capillary: 115 mg/dL — ABNORMAL HIGH (ref 70–99)
Glucose-Capillary: 141 mg/dL — ABNORMAL HIGH (ref 70–99)
Glucose-Capillary: 141 mg/dL — ABNORMAL HIGH (ref 70–99)
Glucose-Capillary: 133 mg/dL — ABNORMAL HIGH (ref 70–99)
Glucose-Capillary: 147 mg/dL — ABNORMAL HIGH (ref 70–99)

## 2023-12-16 LAB — BASIC METABOLIC PANEL WITH GFR
Anion gap: 10 (ref 5–15)
BUN: 52 mg/dL — ABNORMAL HIGH (ref 6–20)
CO2: 16 mmol/L — ABNORMAL LOW (ref 22–32)
Calcium: 8.4 mg/dL — ABNORMAL LOW (ref 8.9–10.3)
Chloride: 120 mmol/L — ABNORMAL HIGH (ref 98–111)
Creatinine, Ser: 1 mg/dL (ref 0.44–1.00)
GFR, Estimated: >60 mL/min (ref >60)
Glucose, Bld: 146 mg/dL — ABNORMAL HIGH (ref 70–99)
Potassium: 3.8 mmol/L (ref 3.5–5.1)
Sodium: 146 mmol/L — ABNORMAL HIGH (ref 135–145)

## 2023-12-16 LAB — MAGNESIUM: Magnesium: 2.4 mg/dL (ref 1.7–2.4)

## 2023-12-16 MED ORDER — POTASSIUM CHLORIDE 20 MEQ PO PACK
40.0000 meq | PACK | Freq: Once | ORAL | Status: AC
  Administered 2023-12-16: 40 meq
  Filled 2023-12-16: qty 2

## 2023-12-16 NOTE — Progress Notes (Signed)
 Pharmacy Electrolyte Replacement  Recent Labs:  Recent Labs    12/16/23 0743  K 3.8  MG 2.4  CREATININE 1.00    Low Critical Values (K </= 2.5, Phos </= 1, Mg </= 1) Present: None  MD Contacted: n/a  Assessment: s/p Lasix 40 x1 on 4/11 and 4/12.   Plan: KCl 40mEq per tube x1.  Repeat in AM.   Meredith Park, PharmD, BCPS, BCCCP Please refer to [facility] Pharmacy numbers 12/16/2023, 9:40 AM

## 2023-12-16 NOTE — Progress Notes (Signed)
 Report given to 3W RN. Patient's mother, [name], aware of transfer to new unit.

## 2023-12-16 NOTE — Progress Notes (Signed)
 Cortrak clogged. Unable to get anything into tube with syringe. Cortrak removed, brown hardened area noted at tip of tube upon removal. NGT placed left nare. ABD xray ordered.

## 2023-12-16 NOTE — Progress Notes (Signed)
 LTM EEG disconnected - no skin breakdown at [facility].

## 2023-12-16 NOTE — Procedures (Addendum)
 Patient Name: Meredith Park  MRN: [id number]  Epilepsy Attending: Arleene Lack  Referring Physician/Provider: Roxy Cordial MD  Duration: 12/15/2023 1316 to 12/16/2023 0720   Patient history: 53 y.o. female with hx of alcohol and substance use, Htn, seizures on lamotrigine and topamax who was found seizing for unknown duration. EEG to evaluate for seizure   Level of alertness:  lethargic , asleep   AEDs during EEG study: LTG, TPM   Technical aspects: This EEG study was done with scalp electrodes positioned according to the 10-20 International system of electrode placement. Electrical activity was reviewed with band pass filter of 1-70Hz , sensitivity of 7 uV/mm, display speed of 63mm/sec with a 60Hz  notched filter applied as appropriate. EEG data were recorded continuously and digitally stored.  Video monitoring was available and reviewed as appropriate.   Description: EEG showed continuous low amplitude 2-3Hz  delta slowing in right fronto-temporal region. EEG also showed 3-7hz  theta-delta slowing in left hemisphere. Sleep was characterized by vertex waves, sleep spindles (12 to 14 Hz), maximal frontocentral region. Hyperventilation and photic stimulation were not performed.      ABNORMALITY - Continuous slow, generalized and maximal right fronto-temporal region    IMPRESSION: This study is suggestive of cortical dysfunction arising from right fronto-temporal region likely secondary to underlying structural abnormality. Additionally there is moderate diffuse encephalopathy. No seizures were noted.   Ethne Jeon O Landree Fernholz

## 2023-12-16 NOTE — Plan of Care (Signed)
  Problem: Clinical Measurements: Goal: Ability to maintain clinical measurements within normal limits will improve Outcome: Progressing Goal: Respiratory complications will improve Outcome: Progressing   Problem: Clinical Measurements: Goal: Respiratory complications will improve Outcome: Progressing   Problem: Nutrition: Goal: Adequate nutrition will be maintained Outcome: Progressing   Problem: Safety: Goal: Ability to remain free from injury will improve Outcome: Progressing

## 2023-12-16 NOTE — Progress Notes (Signed)
 Patient transferred into 3w16, vitals stable and neuro assessment unchanged from previous. Patient friend at bedside.

## 2023-12-16 NOTE — Plan of Care (Signed)
  Problem: Clinical Measurements: Goal: Will remain free from infection Outcome: Progressing Goal: Respiratory complications will improve Outcome: Progressing Goal: Cardiovascular complication will be avoided Outcome: Progressing   Problem: Nutrition: Goal: Adequate nutrition will be maintained Outcome: Progressing   

## 2023-12-16 NOTE — Progress Notes (Signed)
 NAME:  Meredith Park, MRN:  [id number], DOB:  02-17-1971, LOS: 19 ADMISSION DATE:  11/27/2023, CONSULTATION DATE: 3/25 REFERRING MD: Dr. Randal Bury EDP, CHIEF COMPLAINT: Seizure  History of Present Illness:  53 year old female with past medical history as below, which is significant for seizure disorder followed at [facility] neurology, alcohol abuse reportedly sober for 1 year, hypertension, marijuana use, and hyperlipidemia.  Seizures are managed with Lamictal and recently added topamax with better control of seizures.  Last seen by neurology 07/19/2023.  Family reports she did have a mixup with her seizure medications a week prior to arrival.  She ran out of one of her medications and did not realize it.  This has been remedied and she is back taking both medications.  The patient was in her usual state of health until 3/25 when she was found down beside her bed by her family at approximately 7 PM.  Last known well around 2:30 PM.  Her mother found her and noticed her leg shaking and she was unresponsive.  EMS was called upon EMS arrival she was noted to have continuous full body seizure as well as vomiting.  She was treated with 10 mg of IM Versed with cessation of seizures.  She remained minimally responsive in the emergency department.  She was hypoxic requiring 10 L of oxygen.  Chest x-ray was unremarkable.  CT of the head and C-spine were unremarkable for acute issues with the exception of findings associated with chronic paranasal sinusitis.  Due to poor responsiveness PCCM was asked to evaluate for ICU admission.  Pertinent  Medical History   has a past medical history of Alcohol abuse, Alcohol withdrawal (HCC) (08/28/2018), High cholesterol, Hypertension, and Seizures (HCC).   Significant Hospital Events: Including procedures, antibiotic start and stop dates in addition to other pertinent events   3/25 admit to ICU. Intubated 3/26 LP performed. Increasing pressor requirement 3/27 Increased  vasopressor requirement with worsening MOD including AKI, shock liver. Echo with stress CM. 3/29 Improved vasopressor requirements 4/2 central line d/c, extubated (had cuff leak), reintubated for stridor & secretions several hours later 4/5 CTH w/progression of unihemispheric edema, thought more c/w seizure changes 4/6 LTM, steroids, 4/7 extubated, intermittently on predex for agitation, LTM d/c'd, tmax 101.6/ WBC stable 4/10 to TRH 4/11 to ICU   Interim History / Subjective:  Good response to diuretics, now on room air.   Objective   Blood pressure 119/86, pulse 94, temperature 98.2 F (36.8 C), temperature source Axillary, resp. rate (!) 21, height 5' 2.01" (1.575 m), weight 63.1 kg, last menstrual period 08/28/2020, SpO2 98%.        Intake/Output Summary (Last 24 hours) at 12/16/2023 1308 Last data filed at 12/16/2023 1239 Gross per 24 hour  Intake 1896 ml  Output 3175 ml  Net -1279 ml   Filed Weights   12/12/23 0500 12/14/23 0326 12/15/23 0500  Weight: 63 kg 64.5 kg 63.1 kg   Physical Exam: General:  ill appearing adult woman of average build HEENT:. Cortrak secure. On room  Neuro: Awake and follows commands briskly on the right side but no movement even to pain on the left. Tries to talk but unable. Exam is unchanged CV: HS normal and extremities warm. No JVD PULM: Breathing comfortably. Chest clear on room air.  GI: Soft.   Ancillary Tests:   Na 146, hyperchloremic acidosis.  Creatinine 1.07 WBC 17.2, HB 11, PLT 550 CT negative for PE, BNP 447 prior to diuresis Assessment & Plan:  Acute encephalopathy  Status epilepticus Edema R cerebral hemisphere secondary to seizure changes.  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 Now on room air, diurese as needed. - Continue steroid taper for possible vasculitis treatment. Neurological symptoms are stable.  - Continue current anticonvulsants.  - Leukocytosis and thrombocytosis are stable - may reflect chronic microaspiration and steroids.  - Continue tube feeds.   Best Practice (right click and "Reselect all SmartList Selections" daily)   Diet/type: tubefeeds DVT prophylaxis prophylactic heparin    Pressure ulcer(s): N/A GI prophylaxis: PPI while on steroids Lines: RUE PICC>  Foley:  N/A Code Status:  full code Last date of multidisciplinary goals of care discussion [d/w mom 4/11 re transfer back to ICU. Affirms full code status at this time   Arlina Lair, MD [facility] ICU Physician [facility]  Pager: [phone] Mobile: [phone] After hours: [phone].

## 2023-12-17 DIAGNOSIS — G936 Cerebral edema: Secondary | ICD-10-CM | POA: Diagnosis not present

## 2023-12-17 DIAGNOSIS — N179 Acute kidney failure, unspecified: Secondary | ICD-10-CM | POA: Diagnosis not present

## 2023-12-17 DIAGNOSIS — G9340 Encephalopathy, unspecified: Secondary | ICD-10-CM | POA: Diagnosis not present

## 2023-12-17 DIAGNOSIS — G40901 Epilepsy, unspecified, not intractable, with status epilepticus: Secondary | ICD-10-CM | POA: Diagnosis not present

## 2023-12-17 DIAGNOSIS — R4182 Altered mental status, unspecified: Secondary | ICD-10-CM | POA: Diagnosis not present

## 2023-12-17 LAB — CBC WITH DIFFERENTIAL/PLATELET
Abs Immature Granulocytes: 0.66 10*3/uL — ABNORMAL HIGH (ref 0.00–0.07)
Basophils Absolute: 0 10*3/uL (ref 0.0–0.1)
Basophils Relative: 0 %
Eosinophils Absolute: 0.1 10*3/uL (ref 0.0–0.5)
Eosinophils Relative: 1 %
HCT: 29.3 % — ABNORMAL LOW (ref 36.0–46.0)
Hemoglobin: 9.1 g/dL — ABNORMAL LOW (ref 12.0–15.0)
Immature Granulocytes: 7 %
Lymphocytes Relative: 16 %
Lymphs Abs: 1.5 10*3/uL (ref 0.7–4.0)
MCH: 27.7 pg (ref 26.0–34.0)
MCHC: 31.1 g/dL (ref 30.0–36.0)
MCV: 89.1 fL (ref 80.0–100.0)
Monocytes Absolute: 0.6 10*3/uL (ref 0.1–1.0)
Monocytes Relative: 6 %
Neutro Abs: 6.4 10*3/uL (ref 1.7–7.7)
Neutrophils Relative %: 70 %
Platelets: 355 10*3/uL (ref 150–400)
RBC: 3.29 MIL/uL — ABNORMAL LOW (ref 3.87–5.11)
RDW: 15.9 % — ABNORMAL HIGH (ref 11.5–15.5)
WBC: 9.2 10*3/uL (ref 4.0–10.5)
nRBC: 0 % (ref 0.0–0.2)

## 2023-12-17 LAB — GLUCOSE, CAPILLARY
Glucose-Capillary: 102 mg/dL — ABNORMAL HIGH (ref 70–99)
Glucose-Capillary: 104 mg/dL — ABNORMAL HIGH (ref 70–99)
Glucose-Capillary: 121 mg/dL — ABNORMAL HIGH (ref 70–99)
Glucose-Capillary: 131 mg/dL — ABNORMAL HIGH (ref 70–99)
Glucose-Capillary: 138 mg/dL — ABNORMAL HIGH (ref 70–99)
Glucose-Capillary: 171 mg/dL — ABNORMAL HIGH (ref 70–99)
Glucose-Capillary: 193 mg/dL — ABNORMAL HIGH (ref 70–99)

## 2023-12-17 MED ORDER — ACETAMINOPHEN 160 MG/5ML PO SOLN
650.0000 mg | Freq: Four times a day (QID) | ORAL | Status: DC
  Administered 2023-12-17 – 2023-12-26 (×29): 650 mg
  Filled 2023-12-17 (×29): qty 20.3

## 2023-12-17 NOTE — Progress Notes (Signed)
 Speech Language Pathology Treatment: Dysphagia  Patient Details Name: Meredith Park MRN: [id number] DOB: Oct 14, 1970 Today's Date: 12/17/2023 Time: 7829-5621 SLP Time Calculation (min) (ACUTE ONLY): 7 min  Assessment / Plan / Recommendation Clinical Impression  Pt seen for ongoing dysphagia management.  She remains largely aphonic.  Post extubation dysphonia should likely have resolved as it has been 1 week since extubation.  Question effort v vocal fold impairments.  During reflexive cough there was a percussive sounds which suggests there is at least some adduction, but pt was unable to phonate when asked.  Pt may benefit from FEES over MBSS when appropriate for instrumental to visualize vocal cords. Pt with very poor labial seal and inability to contain bolus resulting in moderate-severe anterior loss.  With ice chip, there was minimal oral manipulation, pt did not masticate and no swallow reflex was observed. SLP used suction to assist with clearance.  Pt exhibited wet, reflexive cough following 2 of 4 trials of thin liquid by cup.  Pt was unable to siphon from straw.  With puree there was some oral manipulation and pharyngeal swallow reflex was observed, but a moderate amount of bolus remained in anterior oral cavity and was removed with suction.  Pt is not yet appropriate for instrumental assessment.    Recommend pt remain NPO with alternate means of nutrition, hydration, and medication.   HPI HPI: Meredith Park yo female presenting s/p seizure with vomiting. Admitted with acute encephalopathy, acute respiratory failure, septic shock secondary to aspiration PNA. ETT 3/25-4/2; reintubated for stridor and secretions 4/2-4/7. MRI 4/6: diffuse cortical edema and slight increased vascularity to the  right hemisphere, possibly postictal. PMH: HTN, HLD, ETOH abuse hx, seizure. Previous SLUMS 2023: 26/30 (errors primarily with delayed recall, mild word-finding).      SLP Plan  Continue with current plan of  care      Recommendations for follow up therapy are one component of a multi-disciplinary discharge planning process, led by the attending physician.  Recommendations may be updated based on patient status, additional functional criteria and insurance authorization.    Recommendations  Diet recommendations: NPO Medication Administration: Via alternative means                  Oral care QID   Frequent or constant Supervision/Assistance Dysphagia, unspecified (R13.10)     Continue with current plan of care     Meredith Grim, MA, CCC-SLP Acute Rehabilitation Services Office: [phone] 12/17/2023, 8:42 AM

## 2023-12-17 NOTE — Progress Notes (Addendum)
 Subjective: No acute events overnight.  No new concerns.  ROS: Unable to obtain due to poor mental status  Examination  Vital signs in last 24 hours: Temp:  [97.8 F (36.6 C)-98.4 F (36.9 C)] 97.8 F (36.6 C) (04/14 0743) Pulse Rate:  [86-100] 96 (04/14 0840) Resp:  [13-24] 20 (04/14 0840) BP: (79-132)/(61-98) 122/82 (04/14 0743) SpO2:  [95 %-100 %] 97 % (04/14 0840) Weight:  [63.1 kg] 63.1 kg (04/14 0454)  General: lying in bed, NAD Neuro:  Awake, alert, able to tell me her name and that she is at a hospital, however her voice is severely hypophonic,  follows commands but has profound left hemineglect, antigravity strength in right upper and right lower extremity, withdraws to noxious stimuli in left upper and lower extremity with antigravity strength now  Basic Metabolic Panel: Recent Labs  Lab 12/11/23 0530 12/12/23 0600 12/13/23 0556 12/14/23 0555 12/15/23 0740 12/16/23 0743  NA 151* 154* 157* 150* 150* 146*  K 3.4* 3.3* 4.0 3.8 3.7 3.8  CL 119* 124* 129* 125* 122* 120*  CO2 19* 17* 15* 14* 15* 16*  GLUCOSE 225* 208* 148* 139* 140* 146*  BUN 69* 75* 68* 54* 58* 52*  CREATININE 1.42* 1.17* 1.03* 1.03* 1.07* 1.00  CALCIUM 9.2 9.0 9.0 8.8* 8.7* 8.4*  MG 2.9* 2.8*  --   --   --  2.4  PHOS  --  4.0  --   --   --   --     CBC: Recent Labs  Lab 12/11/23 0530 12/12/23 0600 12/13/23 0556 12/14/23 0555 12/17/23 0633  WBC 14.7* 13.1* 16.3* 17.2* 9.2  NEUTROABS  --   --   --  15.6* 6.4  HGB 9.9* 9.5* 10.5* 11.0* 9.1*  HCT 31.5* 31.1* 34.4* 34.2* 29.3*  MCV 88.7 89.9 90.1 87.7 89.1  PLT 519* 522* 574* 550* 355     Coagulation Studies: No results for input(s): "LABPROT", "INR" in the last 72 hours.  Imaging No new brain imaging overnight  ASSESSMENT AND PLAN:53 y.o. female past medical history of alcohol and substance abuse, hypertension, seizures was found wedged between bed and nightstand and seizing for unknown duration.  Brought in after Versed in the field  with resolution of clinical seizure activity.  Obtunded mentation after significant passage of time prompted further workup. MRI brain with cortical changes in the right hemisphere and crossed cerebellar diaschisis, concerning more for a postictal phenomenology. EEG with LPD's likely ictal in nature.  CT angiogram was done-no emergent occlusion. CSF studies bland with no pleocytosis, mildly elevated protein, negative meningitis/encephalitis panel, no organisms on CSF gram stain and no growth on CSF cultures x 5 days. Exam remains poor despite resolution of ictal activity on the EEG with AED adjustments.  Repeat head CT done that shows extensive right hemispheric edema more than expected from postictal from neurology. Repeat MRI brain shows progression of earlier noted unihemispheric edema of the right cerebral hemisphere, which now includes most of the white matter. Completed 3 days of IV solumedrol 1000mg  .     Status epilepticus convulsive, resolved Acute encephalopathy , resolved - Etiology of her cortical unilateral brain edema remains unclear, still suspect this is post ictal but with progression of edema.  Other differentials include potential autoimmune/paraneoplastic/vasculitic pathology   - Serum Autoimmune encephalopathy panel, serum paraneoplastic encephalopathy panel pending. Anti TPO thyroglobulin antibodies negative - continue Topamax 100mg  daily, Lamotrigine 250mg  BID - Currently on prednisone taper at 50 mg with plan to decrease by 10  mg every week - Plan to repeat MRI brain by end of the week to look for any acute changes/improvement -Management of medical comorbidities per primary team - Neurology will continue to follow peripherally.  Please call us  for any further questions   I have spent a total of 25 minutes with the patient reviewing hospital notes,  test results, labs and examining the patient as well as establishing an assessment and plan. > 50% of time was spent in direct  patient care.      Roxy Cordial Epilepsy [facility] For questions after 5pm please refer to AMION to reach the Neurologist on call

## 2023-12-17 NOTE — Plan of Care (Signed)
  Problem: Education: Goal: Knowledge of General Education information will improve Description: Including pain rating scale, medication(s)/side effects and non-pharmacologic comfort measures Outcome: Progressing   Problem: Health Behavior/Discharge Planning: Goal: Ability to manage health-related needs will improve Outcome: Progressing   Problem: Clinical Measurements: Goal: Ability to maintain clinical measurements within normal limits will improve Outcome: Progressing Goal: Will remain free from infection Outcome: Progressing Goal: Diagnostic test results will improve Outcome: Progressing Goal: Respiratory complications will improve Outcome: Progressing Goal: Cardiovascular complication will be avoided Outcome: Progressing   Problem: Activity: Goal: Risk for activity intolerance will decrease Outcome: Progressing   Problem: Nutrition: Goal: Adequate nutrition will be maintained Outcome: Progressing   Problem: Coping: Goal: Level of anxiety will decrease Outcome: Progressing   Problem: Elimination: Goal: Will not experience complications related to bowel motility Outcome: Progressing Goal: Will not experience complications related to urinary retention Outcome: Progressing   Problem: Pain Managment: Goal: General experience of comfort will improve and/or be controlled Outcome: Progressing   Problem: Safety: Goal: Ability to remain free from injury will improve Outcome: Progressing   Problem: Skin Integrity: Goal: Risk for impaired skin integrity will decrease Outcome: Progressing   Problem: Activity: Goal: Ability to tolerate increased activity will improve Outcome: Progressing   Problem: Respiratory: Goal: Ability to maintain a clear airway and adequate ventilation will improve Outcome: Progressing   Problem: Role Relationship: Goal: Method of communication will improve Outcome: Progressing   Problem: Education: Goal: Ability to describe self-care  measures that may prevent or decrease complications (Diabetes Survival Skills Education) will improve Outcome: Progressing Goal: Individualized Educational Video(s) Outcome: Progressing   Problem: Coping: Goal: Ability to adjust to condition or change in health will improve Outcome: Progressing   Problem: Fluid Volume: Goal: Ability to maintain a balanced intake and output will improve Outcome: Progressing   Problem: Health Behavior/Discharge Planning: Goal: Ability to identify and utilize available resources and services will improve Outcome: Progressing Goal: Ability to manage health-related needs will improve Outcome: Progressing   Problem: Metabolic: Goal: Ability to maintain appropriate glucose levels will improve Outcome: Progressing   Problem: Nutritional: Goal: Maintenance of adequate nutrition will improve Outcome: Progressing Goal: Progress toward achieving an optimal weight will improve Outcome: Progressing   Problem: Skin Integrity: Goal: Risk for impaired skin integrity will decrease Outcome: Progressing   Problem: Tissue Perfusion: Goal: Adequacy of tissue perfusion will improve Outcome: Progressing   Problem: Safety: Goal: Non-violent Restraint(s) Outcome: Progressing   Problem: Education: Goal: Knowledge of disease or condition will improve Outcome: Progressing Goal: Knowledge of secondary prevention will improve (MUST DOCUMENT ALL) Outcome: Progressing Goal: Knowledge of patient specific risk factors will improve (DELETE if not current risk factor) Outcome: Progressing   Problem: Ischemic Stroke/TIA Tissue Perfusion: Goal: Complications of ischemic stroke/TIA will be minimized Outcome: Progressing   Problem: Coping: Goal: Will verbalize positive feelings about self Outcome: Progressing Goal: Will identify appropriate support needs Outcome: Progressing   Problem: Health Behavior/Discharge Planning: Goal: Ability to manage health-related  needs will improve Outcome: Progressing Goal: Goals will be collaboratively established with patient/family Outcome: Progressing   Problem: Self-Care: Goal: Ability to participate in self-care as condition permits will improve Outcome: Progressing Goal: Verbalization of feelings and concerns over difficulty with self-care will improve Outcome: Progressing Goal: Ability to communicate needs accurately will improve Outcome: Progressing   Problem: Nutrition: Goal: Risk of aspiration will decrease Outcome: Progressing Goal: Dietary intake will improve Outcome: Progressing

## 2023-12-17 NOTE — Progress Notes (Signed)
 [facility]                                                                              Meredith Park, is a 53 y.o. female, DOB - 1971/06/14, ZOX:[id number] Admit date - 11/27/2023    Outpatient Primary MD for the patient is Meredith Park, [place] E, PA-C  LOS - 20  days  Chief Complaint  Patient presents with   Seizures       Brief summary   Patient is a 53 year old female with history of alcohol abuse, HLD, seizures, hypertension, abstinence from alcohol for last 1 year, found down beside her bed by her family members, was noted to be shaking and unresponsive.  EMS was called and was found to be in generalized tonic-clonic seizures, treated with Versed with cessation of seizures, remained minimally responsive in the ED.  Hypoxemic requiring 10 L O2 and was intubated to protect airway and admitted to the ICU. Significant events as below. 3/25 admit to ICU.  Hypoxemic and unresponsive.  Intubated 3/26 LP performed. Increasing pressor requirement 3/27 Increased vasopressor requirement with worsening MOD including AKI, shock liver. Echo with stress CM. 3/29 Improved vasopressor requirements 4/2 central line d/c, extubated (had cuff leak), reintubated for stridor & secretions several hours later 4/5 CTH w/progression of unihemispheric edema, thought more c/w seizure changes 4/6 LTM, steroids, 4/7 extubated, intermittently on predex for agitation, LTM d/c'd, tmax 101.6/ WBC stable 4/10 on room air.  Transferred to medical floor.  Core track feeding. 4/11 Park of secretions unable to clear , requiring non- rebreather, transferred back to ICU 4/14: Transferred out of ICU, TRH   Significant studies: Brain MRI 3/28 with extensive cortical restricted diffusion in the right cerebral hemisphere Brain MRI 4/6 with diffuse cortical edema and increased vascularity to the right hemisphere Spinal tap, no evidence of infection. EEG, postictal.  Assessment & Plan    Principal  Problem:   Status epilepticus (HCC) with history of seizure disorder  Acute encephalopathy secondary to status epilepticus Hemispheric edema of the right cerebral hemisphere consistent with postictal state - Much more alert and oriented, still has left-sided weakness/plegia - Continue steroid taper for possible vasculitis treatment - Neurology following, no seizures on EEG, etiology of her cortical unilateral brain edema remains clear suspect this is postictal versus potential autoimmune/paraneoplastic/vasculitis.  CT chest abdomen pelvis without any concern for malignancy. - Per neurology, continue Topamax, lamotrigine and DC Vimpat - Continue prednisone taper with plan to decrease by 10 mg every week, MRI this week to assess for any change.  Acute hypoxic respiratory failure due to possible aspiration versus volume overload SIRS -Patient was initially intubated for status epilepticus and airway protection, subsequently reintubated due to aspiration versus volume overload, has been extubated - On 4/11, was transferred back to ICU for increasing hypoxia, placed on high flow O2 via [place], placed on diuresis, received Lasix last 40 mg IV x 1 on 4/12 - Currently O2 sats 97 to 99% on room air - Continue PT OT, I-S, SLP - Continue Unasyn for aspiration pneumonia     New onset cardiomyopathy, elevated troponin likely due to demand ischemia Hypertension - 2D  echo 11/28/2023 showed EF of 40 to 45%, G1 DD, regional WMA -Seen by cardiology, recommending conservative management - BP currently soft, borderline, GDMT limited by hypotension   Acute kidney injury secondary to rhabdomyolysis, ATN, metabolic acidosis: -Resolved   Hyperchloremic hypernatremia: - Improving, continue free water 300 cc every 4 hours    Transaminitis:  - Due to #1.  Trending down.   Hyperglycemia  - due to steroids and tube feeding - Continue sliding scale insulin   Severe malnutrition and dysphagia: -Nutrition Problem:  Severe Malnutrition Etiology: social / environmental circumstances Signs/Symptoms: mild fat depletion, mild muscle depletion, moderate muscle depletion, severe muscle depletion, percent weight loss Interventions: Prostat, Tube feeding, MVI  Estimated body mass index is 25.44 kg/m as calculated from the following:   Height as of this encounter: 5' 2.01" (1.575 m).   Weight as of this encounter: 63.1 kg.  Code Status: Park CODE STATUS DVT Prophylaxis:  heparin injection 5,000 Units Start: 12/04/23 0930 SCDs Start: 11/27/23 2310   Level of Care: Level of care: Progressive Family Communication: Updated patient Disposition Plan:      Remains inpatient appropriate:      Procedures:    Consultants:   Neurology  Antimicrobials:   Anti-infectives (From admission, onward)    Start     Dose/Rate Route Frequency Ordered Stop   12/14/23 1245  Ampicillin-Sulbactam (UNASYN) 3 g in sodium chloride 0.9 % 100 mL IVPB        3 g 200 mL/hr over 30 Minutes Intravenous Every 6 hours 12/14/23 1154 12/19/23 1244   12/04/23 0930  piperacillin-tazobactam (ZOSYN) IVPB 3.375 g  Status:  Discontinued        3.375 g 12.5 mL/hr over 240 Minutes Intravenous Every 8 hours 12/04/23 0839 12/10/23 1048   11/30/23 1000  cefTRIAXone (ROCEPHIN) 2 g in sodium chloride 0.9 % 100 mL IVPB  Status:  Discontinued        2 g 200 mL/hr over 30 Minutes Intravenous Every 24 hours 11/29/23 1006 12/04/23 0830   11/29/23 1000  vancomycin (VANCOREADY) IVPB 750 mg/150 mL  Status:  Discontinued        750 mg 150 mL/hr over 60 Minutes Intravenous Every 24 hours 11/28/23 0245 11/29/23 1006   11/28/23 2200  acyclovir (ZOVIRAX) 630 mg in dextrose 5 % 100 mL IVPB  Status:  Discontinued        10 mg/kg  63 kg 112.6 mL/hr over 60 Minutes Intravenous Every 24 hours 11/28/23 0247 11/29/23 0836   11/28/23 1000  ampicillin (OMNIPEN) 2 g in sodium chloride 0.9 % 100 mL IVPB  Status:  Discontinued        2 g 300 mL/hr over 20 Minutes  Intravenous Every 8 hours 11/28/23 0249 11/29/23 1006   11/28/23 0115  vancomycin (VANCOREADY) IVPB 2000 mg/400 mL        2,000 mg 200 mL/hr over 120 Minutes Intravenous  Once 11/28/23 0028 11/28/23 0308   11/28/23 0115  cefTRIAXone (ROCEPHIN) 2 g in sodium chloride 0.9 % 100 mL IVPB  Status:  Discontinued        2 g 200 mL/hr over 30 Minutes Intravenous Every 12 hours 11/28/23 0028 11/29/23 1006   11/28/23 0115  ampicillin (OMNIPEN) 2 g in sodium chloride 0.9 % 100 mL IVPB        2 g 300 mL/hr over 20 Minutes Intravenous  Once 11/28/23 0028 11/28/23 0129   11/28/23 0100  acyclovir (ZOVIRAX) 700 mg in dextrose 5 % 100 mL  IVPB        700 mg 114 mL/hr over 60 Minutes Intravenous  Once 11/28/23 0029 11/28/23 0301          Medications  acetaminophen  650 mg Per Tube Q6H   Chlorhexidine Gluconate Cloth  6 each Topical Daily   feeding supplement (PROSource TF20)  60 mL Per Tube BID   fiber supplement (BANATROL TF)  60 mL Per Tube BID   folic acid  1 mg Per Tube Daily   free water  300 mL Per Tube Q4H   heparin injection (subcutaneous)  5,000 Units Subcutaneous Q8H   insulin aspart  0-9 Units Subcutaneous Q4H   lamoTRIgine  250 mg Per Tube BID   multivitamin with minerals  1 tablet Per Tube Daily   nystatin cream   Topical BID   mouth rinse  15 mL Mouth Rinse Q4H   pantoprazole (PROTONIX) IV  40 mg Intravenous QHS   [START ON 12/18/2023] predniSONE  50 mg Oral Q breakfast   Followed by   Melene Muller ON 12/25/2023] predniSONE  40 mg Oral Q breakfast   Followed by   Melene Muller ON 01/01/2024] predniSONE  30 mg Oral Q breakfast   Followed by   Melene Muller ON 01/08/2024] predniSONE  20 mg Oral Q breakfast   Followed by   Melene Muller ON 01/15/2024] predniSONE  10 mg Oral Q breakfast   QUEtiapine  50 mg Per Tube QHS   sodium chloride flush  10-40 mL Intracatheter Q12H   thiamine  100 mg Per Tube Daily   topiramate  100 mg Per Tube QHS      Subjective:   Meredith Park was seen and examined today.   Alert, awake and appears to be oriented, appropriately answers questions but very softly and whispering voice, following commands.  No acute overnight issues, no ongoing nausea vomiting, abdominal pain, chest pain.  Objective:   Vitals:   12/17/23 0351 12/17/23 0454 12/17/23 0743 12/17/23 0840  BP: 96/73  122/82   Pulse: 90  100 96  Resp: 18  20 20   Temp: 98 F (36.7 C)  97.8 F (36.6 C)   TempSrc: Axillary  Oral   SpO2: 100%  99% 97%  Weight:  63.1 kg    Height:        Intake/Output Summary (Last 24 hours) at 12/17/2023 1019 Last data filed at 12/17/2023 0845 Gross per 24 hour  Intake 2742.44 ml  Output 1365 ml  Net 1377.44 ml     Wt Readings from Last 3 Encounters:  12/17/23 63.1 kg  11/23/22 105.9 kg  11/22/22 105.9 kg     Exam General: Alert and oriented x self, place, following commands, speaking in whispering voice. Cardiovascular: S1 S2 auscultated,  RRR, tachycardia Respiratory: Clear to auscultation bilaterally, no wheezing Gastrointestinal: Soft, nontender, nondistended, + bowel sounds Ext: no pedal edema bilaterally Neuro: Speaking in whispering voice, difficult to understand, following commands, left-sided dense hemiplegia with neglect. Psych: Following commands, alert and awake, difficult to understand, speaking in whispering voice    Data Reviewed:  I have personally reviewed following labs    CBC Lab Results  Component Value Date   WBC 9.2 12/17/2023   RBC 3.29 (L) 12/17/2023   HGB 9.1 (L) 12/17/2023   HCT 29.3 (L) 12/17/2023   MCV 89.1 12/17/2023   MCH 27.7 12/17/2023   PLT 355 12/17/2023   MCHC 31.1 12/17/2023   RDW 15.9 (H) 12/17/2023   LYMPHSABS 1.5 12/17/2023   MONOABS 0.6 12/17/2023  EOSABS 0.1 12/17/2023   BASOSABS 0.0 12/17/2023     Last metabolic panel Lab Results  Component Value Date   NA 146 (H) 12/16/2023   K 3.8 12/16/2023   CL 120 (H) 12/16/2023   CO2 16 (L) 12/16/2023   BUN 52 (H) 12/16/2023   CREATININE 1.00  12/16/2023   GLUCOSE 146 (H) 12/16/2023   GFRNONAA >60 12/16/2023   GFRAA >60 11/10/2018   CALCIUM 8.4 (L) 12/16/2023   PHOS 4.0 12/12/2023   PROT 5.6 (L) 12/14/2023   ALBUMIN 2.9 (L) 12/14/2023   BILITOT 0.6 12/14/2023   ALKPHOS 56 12/14/2023   AST 78 (H) 12/14/2023   ALT 66 (H) 12/14/2023   ANIONGAP 10 12/16/2023    CBG (last 3)  Recent Labs    12/17/23 0024 12/17/23 0434 12/17/23 0743  GLUCAP 104* 138* 171*      Coagulation Profile: No results for input(s): "INR", "PROTIME" in the last 168 hours.   Radiology Studies: I have personally reviewed the imaging studies  DG Abd Portable 1V Result Date: 12/16/2023 CLINICAL DATA:  [id number] Encounter for orogastric (OG) tube placement [id number] EXAM: PORTABLE ABDOMEN - 1 VIEW COMPARISON:  None Available. FINDINGS: Esophagogastric tube is well positioned terminating in the stomach. Nonobstructive bowel gas pattern. No pneumoperitoneum. No organomegaly or radiopaque calculi. No acute fracture or destructive lesion. The lung bases are clear. IMPRESSION: Well-positioned esophagogastric tube terminating in the stomach. Electronically Signed   By: Wallie Char M.D.   On: 12/16/2023 10:02   CT Angio Chest Pulmonary Embolism (PE) W or WO Contrast Result Date: 12/15/2023 CLINICAL DATA:  Suspected high prob pulmonary embolism EXAM: CT ANGIOGRAPHY CHEST WITH CONTRAST TECHNIQUE: Multidetector CT imaging of the chest was performed using the standard protocol during bolus administration of intravenous contrast. Multiplanar CT image reconstructions and MIPs were obtained to evaluate the vascular anatomy. RADIATION DOSE REDUCTION: This exam was performed according to the departmental dose-optimization program which includes automated exposure control, adjustment of the mA and/or kV according to patient size and/or use of iterative reconstruction technique. CONTRAST:  75mL OMNIPAQUE IOHEXOL 350 MG/ML SOLN COMPARISON:  12/09/2023 FINDINGS: Cardiovascular:  Heart size normal. Small-moderate pericardial effusion as before. The RV is nondilated. Satisfactory opacification of pulmonary arteries noted, and there is no evidence of pulmonary emboli. Adequate contrast opacification of the thoracic aorta with no evidence of dissection, aneurysm, or stenosis. There is classic 3-vessel brachiocephalic arch anatomy without proximal stenosis. Minimal calcified plaque in the descending thoracic aorta. Mediastinum/Nodes: No mass, hematoma, or adenopathy. Lungs/Pleura: No pleural effusion. No pneumothorax. Dependent consolidation in the lower lobes left greater than right, increased from previous. Upper Abdomen: Feeding tube extends at least as far as the stomach, tip not seen. Mild splenomegaly. No acute findings. Musculoskeletal: No chest wall abnormality. No acute or significant osseous findings. Review of the MIP images confirms the above findings. IMPRESSION: 1. Negative for acute PE or thoracic aortic dissection. 2. Increasing dependent consolidation in the lower lobes left greater than right. 3. Small-moderate pericardial effusion. Electronically Signed   By: Corlis Leak M.D.   On: 12/15/2023 10:52       Magaline Steinberg M.D. [facility] 12/17/2023, 10:19 AM  Available via Epic secure chat 7am-7pm After 7 pm, please refer to night coverage provider listed on amion.

## 2023-12-17 NOTE — Plan of Care (Signed)
  Problem: Clinical Measurements: Goal: Ability to maintain clinical measurements within normal limits will improve Outcome: Progressing Goal: Will remain free from infection Outcome: Progressing Goal: Diagnostic test results will improve Outcome: Progressing Goal: Respiratory complications will improve Outcome: Progressing Goal: Cardiovascular complication will be avoided Outcome: Progressing   Problem: Nutrition: Goal: Adequate nutrition will be maintained Outcome: Progressing   Problem: Coping: Goal: Level of anxiety will decrease Outcome: Progressing   Problem: Elimination: Goal: Will not experience complications related to bowel motility Outcome: Progressing   Problem: Pain Managment: Goal: General experience of comfort will improve and/or be controlled Outcome: Progressing   Problem: Safety: Goal: Ability to remain free from injury will improve Outcome: Progressing   Problem: Skin Integrity: Goal: Risk for impaired skin integrity will decrease Outcome: Progressing   Problem: Activity: Goal: Ability to tolerate increased activity will improve Outcome: Progressing   Problem: Respiratory: Goal: Ability to maintain a clear airway and adequate ventilation will improve Outcome: Progressing   Problem: Role Relationship: Goal: Method of communication will improve Outcome: Progressing

## 2023-12-17 NOTE — Progress Notes (Signed)
 Physical Therapy Treatment Patient Details Name: Delta Pichon MRN: [id number] DOB: 1971/06/05 Today's Date: 12/17/2023   History of Present Illness 53 yo female admitted 11/27/23 s/p seizure with acute encephalopathy, acute respiratory failure, septic shock secondary to aspiration PNA. LP 3/26, ETT 3/26-4/2, re-intubated 4/2. Extubated 4/7. PMH: HTN, HLD ETOH abuse hx, seizure    PT Comments  Pt required transfer to ICU 4/1-4/13 due to respiratory distress. Pt now back on 3W. Pt on RA during today's session. She required max assist rolling, and +2 max assist supine<>sit. She demo poor sitting balance EOB. Simple command follow 50% of trials. Pt supine in bed at end of session. HOB at 38 degrees. Pillows used for positioning and pressure relief.    If plan is discharge home, recommend the following: Two people to help with walking and/or transfers;Two people to help with bathing/dressing/bathroom;Assistance with cooking/housework;Direct supervision/assist for medications management;Assistance with feeding;Assist for transportation;Direct supervision/assist for financial management;Help with stairs or ramp for entrance   Can travel by private vehicle        Equipment Recommendations  Other (comment) (TBA)    Recommendations for Other Services       Precautions / Restrictions Precautions Precautions: Fall;Other (comment) Recall of Precautions/Restrictions: Impaired Precaution/Restrictions Comments: Flexiseal, NGT, R wrist restraint     Mobility  Bed Mobility Overal bed mobility: Needs Assistance Bed Mobility: Rolling, Supine to Sit, Sit to Supine Rolling: Max assist   Supine to sit: +2 for physical assistance, Max assist Sit to supine: Max assist, +2 for physical assistance   General bed mobility comments: assist for all aspects of mobility    Transfers                        Ambulation/Gait                   Stairs             Wheelchair  Mobility     Tilt Bed    Modified Rankin (Stroke Patients Only) Modified Rankin (Stroke Patients Only) Pre-Morbid Rankin Score: No symptoms Modified Rankin: Severe disability     Balance Overall balance assessment: Needs assistance Sitting-balance support: Feet supported Sitting balance-Leahy Scale: Poor                                      Communication Communication Communication: Impaired Factors Affecting Communication: Reduced clarity of speech;Difficulty expressing self  Cognition Arousal: Alert Behavior During Therapy: Flat affect, Restless   PT - Cognitive impairments: Difficult to assess Difficult to assess due to: Impaired communication                     PT - Cognition Comments: attempting to mouth words. Hypophonic voice. Following commands: Impaired Following commands impaired: Follows one step commands with increased time, Follows one step commands inconsistently    Cueing Cueing Techniques: Verbal cues, Tactile cues, Gestural cues  Exercises      General Comments General comments (skin integrity, edema, etc.): VSS on RA      Pertinent Vitals/Pain Pain Assessment Pain Assessment: Faces Faces Pain Scale: No hurt    Home Living                          Prior Function            PT Goals (  current goals can now be found in the care plan section) Acute Rehab PT Goals Patient Stated Goal: unable to state Progress towards PT goals: Progressing toward goals    Frequency    Min 2X/week      PT Plan      Co-evaluation              AM-PAC PT "6 Clicks" Mobility   Outcome Measure  Help needed turning from your back to your side while in a flat bed without using bedrails?: Total Help needed moving from lying on your back to sitting on the side of a flat bed without using bedrails?: Total Help needed moving to and from a bed to a chair (including a wheelchair)?: Total Help needed standing up from a  chair using your arms (e.g., wheelchair or bedside chair)?: Total Help needed to walk in hospital room?: Total Help needed climbing 3-5 steps with a railing? : Total 6 Click Score: 6    End of Session   Activity Tolerance: Patient limited by fatigue Patient left: in bed;with call bell/phone within reach;with bed alarm set;with restraints reapplied Nurse Communication: Mobility status PT Visit Diagnosis: Muscle weakness (generalized) (M62.81);Difficulty in walking, not elsewhere classified (R26.2);Other symptoms and signs involving the nervous system (R29.898)     Time: 1110-1130 PT Time Calculation (min) (ACUTE ONLY): 20 min  Charges:    $Therapeutic Activity: 8-22 mins PT General Charges $$ ACUTE PT VISIT: 1 Visit                     Dorothye Gathers., PT  Office # [phone]    Guadelupe Leech 12/17/2023, 12:09 PM

## 2023-12-18 DIAGNOSIS — R4182 Altered mental status, unspecified: Secondary | ICD-10-CM | POA: Diagnosis not present

## 2023-12-18 DIAGNOSIS — G40901 Epilepsy, unspecified, not intractable, with status epilepticus: Secondary | ICD-10-CM | POA: Diagnosis not present

## 2023-12-18 DIAGNOSIS — N179 Acute kidney failure, unspecified: Secondary | ICD-10-CM | POA: Diagnosis not present

## 2023-12-18 DIAGNOSIS — I214 Non-ST elevation (NSTEMI) myocardial infarction: Secondary | ICD-10-CM | POA: Diagnosis not present

## 2023-12-18 DIAGNOSIS — Z7189 Other specified counseling: Secondary | ICD-10-CM | POA: Diagnosis not present

## 2023-12-18 DIAGNOSIS — Z515 Encounter for palliative care: Secondary | ICD-10-CM | POA: Diagnosis not present

## 2023-12-18 LAB — GLUCOSE, CAPILLARY
Glucose-Capillary: 114 mg/dL — ABNORMAL HIGH (ref 70–99)
Glucose-Capillary: 121 mg/dL — ABNORMAL HIGH (ref 70–99)
Glucose-Capillary: 156 mg/dL — ABNORMAL HIGH (ref 70–99)
Glucose-Capillary: 161 mg/dL — ABNORMAL HIGH (ref 70–99)
Glucose-Capillary: 186 mg/dL — ABNORMAL HIGH (ref 70–99)
Glucose-Capillary: 97 mg/dL (ref 70–99)

## 2023-12-18 MED ORDER — CARVEDILOL 3.125 MG PO TABS
3.1250 mg | ORAL_TABLET | Freq: Two times a day (BID) | ORAL | Status: DC
  Administered 2023-12-18 – 2024-01-01 (×23): 3.125 mg
  Filled 2023-12-18 (×24): qty 1

## 2023-12-18 MED ORDER — ORAL CARE MOUTH RINSE
15.0000 mL | OROMUCOSAL | Status: DC
  Administered 2023-12-19 – 2024-01-02 (×56): 15 mL via OROMUCOSAL

## 2023-12-18 MED ORDER — ORAL CARE MOUTH RINSE: 15.0000 mL | OROMUCOSAL | Status: DC | PRN

## 2023-12-18 MED ORDER — FREE WATER
200.0000 mL | Status: DC
  Administered 2023-12-18 – 2023-12-24 (×37): 200 mL

## 2023-12-18 NOTE — Plan of Care (Signed)
  Problem: Education: Goal: Knowledge of General Education information will improve Description: Including pain rating scale, medication(s)/side effects and non-pharmacologic comfort measures Outcome: Progressing   Problem: Health Behavior/Discharge Planning: Goal: Ability to manage health-related needs will improve Outcome: Progressing   Problem: Clinical Measurements: Goal: Ability to maintain clinical measurements within normal limits will improve Outcome: Progressing Goal: Will remain free from infection Outcome: Progressing Goal: Diagnostic test results will improve Outcome: Progressing Goal: Respiratory complications will improve Outcome: Progressing Goal: Cardiovascular complication will be avoided Outcome: Progressing   Problem: Activity: Goal: Risk for activity intolerance will decrease Outcome: Progressing   Problem: Nutrition: Goal: Adequate nutrition will be maintained Outcome: Progressing   Problem: Coping: Goal: Level of anxiety will decrease Outcome: Progressing   Problem: Elimination: Goal: Will not experience complications related to bowel motility Outcome: Progressing Goal: Will not experience complications related to urinary retention Outcome: Progressing   Problem: Pain Managment: Goal: General experience of comfort will improve and/or be controlled Outcome: Progressing   Problem: Safety: Goal: Ability to remain free from injury will improve Outcome: Progressing   Problem: Skin Integrity: Goal: Risk for impaired skin integrity will decrease Outcome: Progressing   Problem: Activity: Goal: Ability to tolerate increased activity will improve Outcome: Progressing   Problem: Respiratory: Goal: Ability to maintain a clear airway and adequate ventilation will improve Outcome: Progressing   Problem: Role Relationship: Goal: Method of communication will improve Outcome: Progressing   Problem: Education: Goal: Ability to describe self-care  measures that may prevent or decrease complications (Diabetes Survival Skills Education) will improve Outcome: Progressing Goal: Individualized Educational Video(s) Outcome: Progressing   Problem: Coping: Goal: Ability to adjust to condition or change in health will improve Outcome: Progressing   Problem: Fluid Volume: Goal: Ability to maintain a balanced intake and output will improve Outcome: Progressing   Problem: Health Behavior/Discharge Planning: Goal: Ability to identify and utilize available resources and services will improve Outcome: Progressing Goal: Ability to manage health-related needs will improve Outcome: Progressing   Problem: Metabolic: Goal: Ability to maintain appropriate glucose levels will improve Outcome: Progressing   Problem: Nutritional: Goal: Maintenance of adequate nutrition will improve Outcome: Progressing Goal: Progress toward achieving an optimal weight will improve Outcome: Progressing   Problem: Skin Integrity: Goal: Risk for impaired skin integrity will decrease Outcome: Progressing   Problem: Tissue Perfusion: Goal: Adequacy of tissue perfusion will improve Outcome: Progressing   Problem: Safety: Goal: Non-violent Restraint(s) Outcome: Progressing   Problem: Education: Goal: Knowledge of disease or condition will improve Outcome: Progressing Goal: Knowledge of secondary prevention will improve (MUST DOCUMENT ALL) Outcome: Progressing Goal: Knowledge of patient specific risk factors will improve (DELETE if not current risk factor) Outcome: Progressing   Problem: Ischemic Stroke/TIA Tissue Perfusion: Goal: Complications of ischemic stroke/TIA will be minimized Outcome: Progressing   Problem: Coping: Goal: Will verbalize positive feelings about self Outcome: Progressing Goal: Will identify appropriate support needs Outcome: Progressing   Problem: Health Behavior/Discharge Planning: Goal: Ability to manage health-related  needs will improve Outcome: Progressing Goal: Goals will be collaboratively established with patient/family Outcome: Progressing   Problem: Self-Care: Goal: Ability to participate in self-care as condition permits will improve Outcome: Progressing Goal: Verbalization of feelings and concerns over difficulty with self-care will improve Outcome: Progressing Goal: Ability to communicate needs accurately will improve Outcome: Progressing   Problem: Nutrition: Goal: Risk of aspiration will decrease Outcome: Progressing Goal: Dietary intake will improve Outcome: Progressing

## 2023-12-18 NOTE — Progress Notes (Signed)
 Orthopedic Tech Progress Note Patient Details:  Royalty Fakhouri 09-13-1970 [id number]  Called in order to [employer] for a [employer]   Patient ID: Andilynn Delavega, female   DOB: 24-Jun-1971, 53 y.o.   MRN: [id number]  Kermitt Pedlar 12/18/2023, 5:09 PM

## 2023-12-18 NOTE — Plan of Care (Signed)
  Problem: Ischemic Stroke/TIA Tissue Perfusion: Goal: Complications of ischemic stroke/TIA will be minimized Outcome: Progressing   Problem: Health Behavior/Discharge Planning: Goal: Goals will be collaboratively established with patient/family Outcome: Progressing   Problem: Self-Care: Goal: Ability to communicate needs accurately will improve Outcome: Progressing   Problem: Nutrition: Goal: Dietary intake will improve Outcome: Progressing

## 2023-12-18 NOTE — Progress Notes (Signed)
 Nutrition Follow-up  DOCUMENTATION CODES:  Severe malnutrition in context of social or environmental circumstances  INTERVENTION:  Continue tube feeding via NGT: Osmolite 1.5 at 50 ml/h (1200 ml per day) Prosource TF20 60 ml to BID Adjust FWF to q3h Provides 1960 kcal, 115 gm protein, 914 ml free water daily (TF+flush = free water daily) Continue MVI, folic acid, Thiamine daily  Monitor GOC discussions, pt will likely benefit from PEG placement as she has had minimal progress with her ability to swallow If PEG is delayed, recommend exchanging NGT for cortrak  NUTRITION DIAGNOSIS:  Severe Malnutrition related to social / environmental circumstances as evidenced by severe muscle depletion, percent weight loss. Ongoing.   GOAL:  Patient will meet greater than or equal to 90% of their needs Met with enteral feeds  MONITOR:  TF tolerance, Diet advancement, I & O's, Labs, Weight trends  REASON FOR ASSESSMENT:  Ventilator    ASSESSMENT:  Pt with hx of alcohol abuse, drug abuse, HTN, and HLD presented to ED after being found down at home with seizure-like activity.  3/25 - admitted; intubated 3/27 - increased pressor requirement worsening MOD 3/28 - s/p cortrak placement (gastric) 3/29 - improved pressor requirements 4/2 - extubated; re-intubated due to stridor and secretions 4/7 - Extubated, NPO  4/13 - cortrak clogged, removed and replaced with NGT, transferred to 3W  Pt resting in bed at the time of assessment. Awake, but does not interact. Noted NGT in place with TF infusing at goal. RN reports it is well tolerated.   Pt with no progress at the time towards PO diet. Noted that MD has consulted PMT for conversations around PEG placement. If PEG placement is delayed, wound recommend replacing NGT with cortrak for more comfort for pt.   Admit weight: 63 kg  Current weight: 62.3 kg  39% weight loss x 12 months (11/23/22-admission 11/28/23), confirmed to be accurate  with mother of pt  Nutritionally Relevant Medications: Scheduled Meds:  acetaminophen (TYLENOL) oral liquid 160 mg/5 mL  650 mg Per Tube Q6H   PROSource TF20  60 mL Per Tube BID   BANATROL TF  60 mL Per Tube BID   folic acid  1 mg Per Tube Daily   free water  300 mL Per Tube Q4H   insulin aspart  0-9 Units Subcutaneous Q4H   multivitamin with minerals  1 tablet Per Tube Daily   pantoprazole (PROTONIX)  40 mg Intravenous QHS   predniSONE  50 mg Oral Q breakfast   thiamine  100 mg Per Tube Daily   Continuous Infusions:  ampicillin-sulbactam (UNASYN) IV 3 g (12/18/23 0549)   feeding supplement (OSMOLITE 1.5 CAL) 50 mL/hr at 12/18/23 0155   PRN Meds:.docusate sodium, polyethylene glycol  Labs Reviewed (4/13 draw): Sodium 146, chloride 120 BUN 52 CBG ranges from 102-193 mg/dL over the last 24 hours HgbA1c 5% (3/27)   NUTRITION - FOCUSED PHYSICAL EXAM: Flowsheet Row Most Recent Value  Orbital Region Mild depletion  Upper Arm Region Mild depletion  Thoracic and Lumbar Region No depletion  Buccal Region Moderate depletion  Temple Region Mild depletion  Clavicle Bone Region Mild depletion  Clavicle and Acromion Bone Region Moderate depletion  Scapular Bone Region Moderate depletion  Dorsal Hand Severe depletion  Patellar Region Severe depletion  Anterior Thigh Region Severe depletion  Posterior Calf Region Moderate depletion  Edema (RD Assessment) Mild  [hand]  Hair Reviewed  Eyes Unable to assess  Mouth Unable to assess  Skin Reviewed  Nails Reviewed   Diet Order:   Diet Order             Diet NPO time specified  Diet effective now                   EDUCATION NEEDS:  Not appropriate for education at this time  Skin:  Skin Assessment: Reviewed RN Assessment  Last BM:  4/14 - type 6 FMS placed 4/5  Height:  Ht Readings from Last 1 Encounters:  12/05/23 5' 2.01" (1.575 m)    Weight:  Wt Readings from Last 1 Encounters:  12/18/23 62.3 kg    Ideal  Body Weight:  50 kg  BMI:  Body mass index is 25.11 kg/m.  Estimated Nutritional Needs:  Kcal:  1800-2000 kcal/d Protein:  100-120 grams Fluid:  1.8-2L/d    Edwena Graham, RD, LDN Registered Dietitian II Please reach out via secure chat

## 2023-12-18 NOTE — TOC Progression Note (Signed)
 Transition of Care [facility]) - Progression Note    Patient Details  Name: Meredith Park MRN: [id number] Date of Birth: 11-Jul-1971  Transition of Care [facility]) CM/SW Contact  Tandy Fam, [place] Phone Number: 12/18/2023, 11:55 AM  Clinical Narrative:   CSW following for disposition. Patient not medically ready for SNF at this time, still NPO with cortrak, palliative care pending for goals of care. CSW to follow.    Expected Discharge Plan: Skilled Nursing Facility Barriers to Discharge: Continued Medical Work up, English as a second language teacher, SNF Pending bed offer  Expected Discharge Plan and Services In-house Referral: Clinical Social Work   Post Acute Care Choice: Skilled Nursing Facility Living arrangements for the past 2 months: Single Family Home                                       Social Determinants of Health (SDOH) Interventions SDOH Screenings   Food Insecurity: Low Risk  (07/18/2023)   Received from [facility]  Housing: Low Risk  (07/18/2023)   Received from [facility]  Transportation Needs: No Transportation Needs (07/18/2023)   Received from [facility]  Utilities: Low Risk  (07/18/2023)   Received from [facility]  Tobacco Use: Low Risk  (12/11/2023)  Recent Concern: Tobacco Use - Medium Risk (11/05/2023)   Received from [facility]    Readmission Risk Interventions    12/12/2023    3:50 PM  Readmission Risk Prevention Plan  Transportation Screening Complete  Medication Review (RN Care Manager) Complete  PCP or Specialist appointment within 3-5 days of discharge Complete  HRI or Home Care Consult Complete  SW Recovery Care/Counseling Consult Complete  Palliative Care Screening Not Applicable  Skilled Nursing Facility Complete

## 2023-12-18 NOTE — Progress Notes (Signed)
 PIV consult: R forearm veins non-compressible. Long 22g placed L AC. Please consider PICC if pt will require prolonged IV infusions.

## 2023-12-18 NOTE — Plan of Care (Signed)
  Problem: Education: °Goal: Knowledge of General Education information will improve °Description: Including pain rating scale, medication(s)/side effects and non-pharmacologic comfort measures °Outcome: Progressing °  °Problem: Nutrition: °Goal: Dietary intake will improve °Outcome: Progressing °  °

## 2023-12-18 NOTE — Progress Notes (Signed)
 [facility]                                                                              Meredith Park, is a 53 y.o. female, DOB - 13-Apr-1971, BMW:[id number] Admit date - 11/27/2023    Outpatient Primary MD for the patient is Piedad Climes, [place], PA-C  LOS - 21  days  Chief Complaint  Patient presents with   Seizures       Brief summary   Patient is a 53 year old female with history of alcohol abuse, HLD, seizures, hypertension, abstinence from alcohol for last 1 year, found down beside her bed by her family members, was noted to be shaking and unresponsive.  EMS was called and was found to be in generalized tonic-clonic seizures, treated with Versed with cessation of seizures, remained minimally responsive in the ED.  Hypoxemic requiring 10 L O2 and was intubated to protect airway and admitted to the ICU. Significant events as below. 3/25 admit to ICU.  Hypoxemic and unresponsive.  Intubated 3/26 LP performed. Increasing pressor requirement 3/27 Increased vasopressor requirement with worsening MOD including AKI, shock liver. Echo with stress CM. 3/29 Improved vasopressor requirements 4/2 central line d/c, extubated (had cuff leak), reintubated for stridor & secretions several hours later 4/5 CTH w/progression of unihemispheric edema, thought more c/w seizure changes 4/6 LTM, steroids, 4/7 extubated, intermittently on predex for agitation, LTM d/c'd, tmax 101.6/ WBC stable 4/10 on room air.  Transferred to medical floor.  Core track feeding. 4/11 full of secretions unable to clear , requiring non- rebreather, transferred back to ICU 4/14: Transferred out of ICU, TRH   Significant studies: Brain MRI 3/28 with extensive cortical restricted diffusion in the right cerebral hemisphere Brain MRI 4/6 with diffuse cortical edema and increased vascularity to the right hemisphere Spinal tap, no evidence of infection. EEG, postictal.  Assessment & Plan    Principal  Problem:   Status epilepticus (HCC) with history of seizure disorder  Acute encephalopathy secondary to status epilepticus Hemispheric edema of the right cerebral hemisphere consistent with postictal state - still has left-sided weakness/plegia - Continue steroid taper for possible vasculitis treatment - Neurology following, no seizures on EEG, etiology of her cortical unilateral brain edema remains clear suspect this is postictal versus potential autoimmune/paraneoplastic/vasculitis.  CT chest abdomen pelvis without any concern for malignancy. - Per neurology, continue Topamax, lamotrigine and DC Vimpat - Continue prednisone taper with plan to decrease by 10 mg every week, MRI this week to assess for any change. - Much more alert and oriented, following commands  Acute hypoxic respiratory failure due to possible aspiration versus volume overload SIRS -Patient was initially intubated for status epilepticus and airway protection, subsequently reintubated due to aspiration versus volume overload, has been extubated - On 4/11, was transferred back to ICU for increasing hypoxia, placed on high flow O2 via [place], placed on diuresis, received Lasix last 40 mg IV x 1 on 4/12 - O2 sats 100% on room air  - Continue PT OT, I-S, SLP - Continue IV Unasyn for aspiration pneumonia   Dysphagia/aspiration -Currently has core track, NPO, failed SLP evaluation -Continue tube feeds via  core track, free water - Palliative medicine consult for GOC/PEG tube     New onset cardiomyopathy, elevated troponin likely due to demand ischemia Hypertension - 2D echo 11/28/2023 showed EF of 40 to 45%, G1 DD, regional WMA -Seen by cardiology, recommending conservative management - BP improving, GDMT limited by hypotension, dysphagia -Will start Coreg 3.125 mg twice daily, ACE/ARB once tolerated with BP   Acute kidney injury secondary to rhabdomyolysis, ATN, metabolic acidosis: -Resolved   Hyperchloremic  hypernatremia: - Improving, continue free water 300 cc every 4 hours  -Follow CMET  Transaminitis:  - Due to #1.  Trending down, monitor intermittently.   Hyperglycemia  - due to steroids and tube feeding - Continue sliding scale insulin CBG (last 3)  Recent Labs    12/18/23 0344 12/18/23 0724 12/18/23 1146  GLUCAP 121* 114* 156*      Severe malnutrition and dysphagia: -Nutrition Problem: Severe Malnutrition Etiology: social / environmental circumstances Signs/Symptoms: severe muscle depletion, percent weight loss Interventions: Prostat, Tube feeding, MVI  Estimated body mass index is 25.11 kg/m as calculated from the following:   Height as of this encounter: 5' 2.01" (1.575 m).   Weight as of this encounter: 62.3 kg.  Code Status: Full CODE STATUS DVT Prophylaxis:  heparin injection 5,000 Units Start: 12/04/23 0930 SCDs Start: 11/27/23 2310   Level of Care: Level of care: Progressive Family Communication: Updated patient Disposition Plan:      Remains inpatient appropriate:      Procedures:    Consultants:   Neurology  Antimicrobials:   Anti-infectives (From admission, onward)    Start     Dose/Rate Route Frequency Ordered Stop   12/14/23 1245  Ampicillin-Sulbactam (UNASYN) 3 g in sodium chloride 0.9 % 100 mL IVPB        3 g 200 mL/hr over 30 Minutes Intravenous Every 6 hours 12/14/23 1154 12/19/23 1244   12/04/23 0930  piperacillin-tazobactam (ZOSYN) IVPB 3.375 g  Status:  Discontinued        3.375 g 12.5 mL/hr over 240 Minutes Intravenous Every 8 hours 12/04/23 0839 12/10/23 1048   11/30/23 1000  cefTRIAXone (ROCEPHIN) 2 g in sodium chloride 0.9 % 100 mL IVPB  Status:  Discontinued        2 g 200 mL/hr over 30 Minutes Intravenous Every 24 hours 11/29/23 1006 12/04/23 0830   11/29/23 1000  vancomycin (VANCOREADY) IVPB 750 mg/150 mL  Status:  Discontinued        750 mg 150 mL/hr over 60 Minutes Intravenous Every 24 hours 11/28/23 0245 11/29/23 1006    11/28/23 2200  acyclovir (ZOVIRAX) 630 mg in dextrose 5 % 100 mL IVPB  Status:  Discontinued        10 mg/kg  63 kg 112.6 mL/hr over 60 Minutes Intravenous Every 24 hours 11/28/23 0247 11/29/23 0836   11/28/23 1000  ampicillin (OMNIPEN) 2 g in sodium chloride 0.9 % 100 mL IVPB  Status:  Discontinued        2 g 300 mL/hr over 20 Minutes Intravenous Every 8 hours 11/28/23 0249 11/29/23 1006   11/28/23 0115  vancomycin (VANCOREADY) IVPB 2000 mg/400 mL        2,000 mg 200 mL/hr over 120 Minutes Intravenous  Once 11/28/23 0028 11/28/23 0308   11/28/23 0115  cefTRIAXone (ROCEPHIN) 2 g in sodium chloride 0.9 % 100 mL IVPB  Status:  Discontinued        2 g 200 mL/hr over 30 Minutes Intravenous Every 12 hours 11/28/23  0028 11/29/23 1006   11/28/23 0115  ampicillin (OMNIPEN) 2 g in sodium chloride 0.9 % 100 mL IVPB        2 g 300 mL/hr over 20 Minutes Intravenous  Once 11/28/23 0028 11/28/23 0129   11/28/23 0100  acyclovir (ZOVIRAX) 700 mg in dextrose 5 % 100 mL IVPB        700 mg 114 mL/hr over 60 Minutes Intravenous  Once 11/28/23 0029 11/28/23 0301          Medications  acetaminophen (TYLENOL) oral liquid 160 mg/5 mL  650 mg Per Tube Q6H   Chlorhexidine Gluconate Cloth  6 each Topical Daily   feeding supplement (PROSource TF20)  60 mL Per Tube BID   fiber supplement (BANATROL TF)  60 mL Per Tube BID   folic acid  1 mg Per Tube Daily   free water  300 mL Per Tube Q4H   heparin injection (subcutaneous)  5,000 Units Subcutaneous Q8H   insulin aspart  0-9 Units Subcutaneous Q4H   lamoTRIgine  250 mg Per Tube BID   multivitamin with minerals  1 tablet Per Tube Daily   nystatin cream   Topical BID   mouth rinse  15 mL Mouth Rinse Q4H   pantoprazole (PROTONIX) IV  40 mg Intravenous QHS   predniSONE  50 mg Oral Q breakfast   Followed by   Melene Muller ON 12/25/2023] predniSONE  40 mg Oral Q breakfast   Followed by   Melene Muller ON 01/01/2024] predniSONE  30 mg Oral Q breakfast   Followed by    Melene Muller ON 01/08/2024] predniSONE  20 mg Oral Q breakfast   Followed by   Melene Muller ON 01/15/2024] predniSONE  10 mg Oral Q breakfast   QUEtiapine  50 mg Per Tube QHS   thiamine  100 mg Per Tube Daily   topiramate  100 mg Per Tube QHS      Subjective:   Meredith Park was seen and examined today.  Much more alert and oriented today, following commands, has core track.  Still speaking softly and whispering voice.  No acute overnight issues, no ongoing nausea vomiting, abdominal pain, chest pain.  Objective:   Vitals:   12/18/23 0342 12/18/23 0420 12/18/23 0722 12/18/23 1144  BP: (!) 123/93  (!) 131/91 (!) 130/93  Pulse: 93  80 90  Resp: 18  16 20   Temp: 98.3 F (36.8 C)  97.8 F (36.6 C) 98.2 F (36.8 C)  TempSrc: Oral  Oral Oral  SpO2: 100%  99% 100%  Weight:  62.3 kg    Height:        Intake/Output Summary (Last 24 hours) at 12/18/2023 1237 Last data filed at 12/18/2023 0800 Gross per 24 hour  Intake 2762 ml  Output 2250 ml  Net 512 ml     Wt Readings from Last 3 Encounters:  12/18/23 62.3 kg  11/23/22 105.9 kg  11/22/22 105.9 kg    Physical Exam General: Alert and oriented x self and place, following commands, speaking softly Cardiovascular: S1 S2 clear, RRR.  Respiratory: CTAB, no wheezing, rales or rhonchi Gastrointestinal: Soft, nontender, nondistended, NBS Ext: no pedal edema bilaterally Neuro: left-sided hemiplegia, neglect Psych: following commands, speaking very softly and whispering voice  Data Reviewed:  I have personally reviewed following labs    CBC Lab Results  Component Value Date   WBC 9.2 12/17/2023   RBC 3.29 (L) 12/17/2023   HGB 9.1 (L) 12/17/2023   HCT 29.3 (L) 12/17/2023  MCV 89.1 12/17/2023   MCH 27.7 12/17/2023   PLT 355 12/17/2023   MCHC 31.1 12/17/2023   RDW 15.9 (H) 12/17/2023   LYMPHSABS 1.5 12/17/2023   MONOABS 0.6 12/17/2023   EOSABS 0.1 12/17/2023   BASOSABS 0.0 12/17/2023     Last metabolic panel Lab Results   Component Value Date   NA 146 (H) 12/16/2023   K 3.8 12/16/2023   CL 120 (H) 12/16/2023   CO2 16 (L) 12/16/2023   BUN 52 (H) 12/16/2023   CREATININE 1.00 12/16/2023   GLUCOSE 146 (H) 12/16/2023   GFRNONAA >60 12/16/2023   GFRAA >60 11/10/2018   CALCIUM 8.4 (L) 12/16/2023   PHOS 4.0 12/12/2023   PROT 5.6 (L) 12/14/2023   ALBUMIN 2.9 (L) 12/14/2023   BILITOT 0.6 12/14/2023   ALKPHOS 56 12/14/2023   AST 78 (H) 12/14/2023   ALT 66 (H) 12/14/2023   ANIONGAP 10 12/16/2023    CBG (last 3)  Recent Labs    12/18/23 0344 12/18/23 0724 12/18/23 1146  GLUCAP 121* 114* 156*      Coagulation Profile: No results for input(s): "INR", "PROTIME" in the last 168 hours.   Radiology Studies: I have personally reviewed the imaging studies  No results found.      Bertram Brocks M.D. [facility] 12/18/2023, 12:37 PM  Available via Epic secure chat 7am-7pm After 7 pm, please refer to night coverage provider listed on amion.

## 2023-12-18 NOTE — Progress Notes (Signed)
 Occupational Therapy Treatment Patient Details Name: Meredith Park MRN: [id number] DOB: September 10, 1970 Today's Date: 12/18/2023   History of present illness 53 yo female admitted 11/27/23 s/p seizure with acute encephalopathy, acute respiratory failure, septic shock secondary to aspiration PNA. LP 3/26, ETT 3/26-4/2, re-intubated 4/2. Extubated 4/7. PMH: HTN, HLD ETOH abuse hx, seizure   OT comments  Pt fatigued this session and bed level session provided. New L UE resting hand splint requested as this is missing since patient moved rooms. Pt with increased attention to L UE with R UE this session. Pt following 1 step commands. Recommendation skilled inpatient follow up therapy, <3 hours/day.       If plan is discharge home, recommend the following:  Two people to help with walking and/or transfers;Two people to help with bathing/dressing/bathroom   Equipment Recommendations  Wheelchair (measurements OT);Wheelchair cushion (measurements OT);Hospital bed;Hoyer lift;BSC/3in1    Recommendations for Other Services Speech consult;PT consult    Precautions / Restrictions Precautions Precautions: Fall;Other (comment) Recall of Precautions/Restrictions: Impaired Precaution/Restrictions Comments: rectal pouch,cortrak, R wrist restraint       Mobility Bed Mobility               General bed mobility comments: pt turned to L side by tech on arrival just finishing bathing    Transfers                   General transfer comment: declined     Balance                                           ADL either performed or assessed with clinical judgement   ADL Overall ADL's : Needs assistance/impaired     Grooming: Set up;Bed level;Wash/dry face                                 General ADL Comments: PROM L UE hand wrist elbow shoulder. pt using R UE to move L UE on command    Extremity/Trunk Assessment Upper Extremity Assessment Upper  Extremity Assessment: Right hand dominant;LUE deficits/detail LUE Deficits / Details: no subluxation noted at this time during session. pt with flaccid L UE. pt touching with R UE and following commands only with R UE. pt missing resting hand splint so RN and orthotech notified of missing L resting hand splint. Signs posted in room for wear LUE Sensation: decreased light touch;decreased proprioception LUE Coordination: decreased fine motor;decreased gross motor   Lower Extremity Assessment Lower Extremity Assessment: Defer to PT evaluation        Vision   Vision Assessment?: Yes Eye Alignment: Impaired (comment) Additional Comments: Pt with L gaze preference and cervical rotation   Perception Perception Perception: Impaired Preception Impairment Details: Inattention/Neglect Perception-Other Comments: Left   Praxis     Communication Communication Communication: Impaired Factors Affecting Communication: Reduced clarity of speech   Cognition Arousal: Lethargic Behavior During Therapy: Flat affect Cognition: Cognition impaired Difficult to assess due to: Impaired communication, Level of arousal   Awareness: Intellectual awareness intact, Online awareness impaired   Attention impairment (select first level of impairment): Sustained attention   OT - Cognition Comments: pt following 1 step commands and viusally attend to name care. pt reports "dont want to " and nods "yes" to confirm does not want to attempt EOB dangle.  Following commands: Impaired Following commands impaired: Follows one step commands with increased time      Cueing      Exercises Exercises: Other exercises Other Exercises Other Exercises: PROM L UE digits wrist elbow Other Exercises: no subluxation noted at this time Other Exercises: cervical AAROM toward R provided, neck extension, neck flexion. neurtal position with rolled towel for alignment while at rest. pt verbalized  comfortable    Shoulder Instructions       General Comments      Pertinent Vitals/ Pain       Pain Assessment Pain Assessment: No/denies pain  Home Living                                          Prior Functioning/Environment              Frequency  Min 2X/week        Progress Toward Goals  OT Goals(current goals can now be found in the care plan section)  Progress towards OT goals: Progressing toward goals  Acute Rehab OT Goals Patient Stated Goal: to sleep OT Goal Formulation: Patient unable to participate in goal setting Time For Goal Achievement: 01/01/24 Potential to Achieve Goals: Fair ADL Goals Pt Will Perform Grooming: with set-up;sitting Pt Will Perform Upper Body Bathing: with min assist;sitting Pt Will Transfer to Toilet: with max assist;bedside commode;stand pivot transfer Additional ADL Goal #1: pt will don resting hand splint with mod (A) to LUE  Plan      Co-evaluation                 AM-PAC OT "6 Clicks" Daily Activity     Outcome Measure   Help from another person eating meals?: A Lot Help from another person taking care of personal grooming?: A Lot Help from another person toileting, which includes using toliet, bedpan, or urinal?: Total Help from another person bathing (including washing, rinsing, drying)?: A Lot Help from another person to put on and taking off regular upper body clothing?: A Lot Help from another person to put on and taking off regular lower body clothing?: Total 6 Click Score: 10    End of Session    OT Visit Diagnosis: Unsteadiness on feet (R26.81);Muscle weakness (generalized) (M62.81)   Activity Tolerance Patient tolerated treatment well;Patient limited by fatigue   Patient Left in bed;with call bell/phone within reach;with bed alarm set;with restraints reapplied (R UE)   Nurse Communication Mobility status;Precautions        Time: 1610-9604 OT Time Calculation (min): 18  min  Charges: OT General Charges $OT Visit: 1 Visit OT Treatments $Self Care/Home Management : 8-22 mins   Brynn, OTR/L  Acute Rehabilitation Services Office: [phone] .   Neomia Banner 12/18/2023, 1:16 PM

## 2023-12-18 NOTE — Consult Note (Signed)
 Consultation Note Date: 12/18/2023   Patient Name: Meredith Park  DOB: 13-May-1971  MRN: [id number]  Age / Sex: 53 y.o., female  PCP: Burnis Medin, PA-C Referring Physician: Cathren Harsh, MD  Reason for Consultation: Establishing goals of care  HPI/Patient Profile: 53 y.o. female  with past medical history of seizure disorder on multiple antiepileptics, previous alcohol use sober for 1 year, marijuana use, hypertension, hyperlipidemia, admitted on 11/27/2023 with seizures after being found down by family (mother).   Patient was admitted for status epilepticus and CCM was consulted for acute respiratory failure and intubation. PMT has been consulted to assist with goals of care conversation.  Clinical Assessment and Goals of Care:  I have reviewed medical records including EPIC notes, labs and imaging, discussed with RN, assessed the patient and then at the bedside to discuss diagnosis prognosis, GOC, EOL wishes, disposition and options.  I introduced Palliative Medicine as specialized medical care for people living with serious illness. It focuses on providing relief from the symptoms and stress of a serious illness. The goal is to improve quality of life for both the patient and the family.  We discussed a brief life review of the patient and then focused on their current illness.   I attempted to elicit values and goals of care important to the patient.    Social History: Patient nods when nurse mentions that she was visited by daughter and son-in-law last night.  She also nods when I asked about her mother being a support person.  Palliative Symptoms: Patient neither confirms nor denies any symptoms at this time  Discussion: Attempted to discuss goals of care with patient, though she was a bit distracted by RN's attempt to place IV.  She is agreeable to this PA reaching out to her daughter to coordinate a family meeting for  further goals of care discussion.  I then called daughter and left a voicemail with PMT contact information.   Returned to the bedside to attempt discussion again.  Patient was being seen by IV team and has was not very conversational with me today.  Provided her with update on my effort to reach her daughter.  I attempted to explore her thoughts and feelings on her current care, treatment options, values and whether she has any limitations or boundaries to her care such as permanent feeding tube.   Discussed the importance of continued conversation with family and the medical providers regarding overall plan of care and treatment options, ensuring decisions are within the context of the patient's values and GOCs.   Hard Choices booklet left for review. The family was encouraged to call with questions or concerns.  PMT will continue to support holistically.   SUMMARY OF RECOMMENDATIONS   - Continue full code/full scope treatment for now - Will attempt to coordinate a preferred date/time for family meeting to discuss goals of care in more detail.  Left voicemail for daughter today - PMT will continue to follow and support   Prognosis:  Guarded  Discharge Planning: To Be Determined      Primary Diagnoses: Present on Admission:  Status epilepticus [facility])   Physical Exam Vitals and nursing note reviewed.  Constitutional:      General: She is not in acute distress.    Appearance: She is ill-appearing.     Comments: Cortrak in place  Cardiovascular:     Rate and Rhythm: Normal rate.  Pulmonary:     Effort: Pulmonary effort is normal.  Skin:  General: Skin is warm and dry.  Neurological:     Mental Status: She is alert.  Psychiatric:        Mood and Affect: Affect is flat.        Behavior: Behavior is withdrawn.    Vital Signs: BP (!) 131/91 (BP Location: Right Arm)   Pulse 80   Temp 97.8 F (36.6 C) (Oral)   Resp 16   Ht 5' 2.01" (1.575 m)   Wt 62.3 kg   LMP 08/28/2020    SpO2 99%   BMI 25.11 kg/m  Pain Scale: PAINAD   Pain Score: 0-No pain   SpO2: SpO2: 99 % O2 Device:SpO2: 99 % O2 Flow Rate: .O2 Flow Rate (L/min): 4 L/min    Total time: I spent 75 minutes in the care of the patient today in the above activities and documenting the encounter.    Bradshaw Minihan P Adrion Menz, PA-C  Palliative Medicine Team Team phone # [phone]  Thank you for allowing the Palliative Medicine Team to assist in the care of this patient. Please utilize secure chat with additional questions, if there is no response within 30 minutes please call the above phone number.  Palliative Medicine Team providers are available by phone from 7am to 7pm daily and can be reached through the team cell phone.  Should this patient require assistance outside of these hours, please call the patient's attending physician.

## 2023-12-19 ENCOUNTER — Inpatient Hospital Stay (HOSPITAL_COMMUNITY)

## 2023-12-19 DIAGNOSIS — N179 Acute kidney failure, unspecified: Secondary | ICD-10-CM | POA: Diagnosis not present

## 2023-12-19 DIAGNOSIS — E43 Unspecified severe protein-calorie malnutrition: Secondary | ICD-10-CM

## 2023-12-19 DIAGNOSIS — J9601 Acute respiratory failure with hypoxia: Secondary | ICD-10-CM | POA: Diagnosis not present

## 2023-12-19 DIAGNOSIS — R569 Unspecified convulsions: Secondary | ICD-10-CM | POA: Diagnosis not present

## 2023-12-19 LAB — CBC
HCT: 32.1 % — ABNORMAL LOW (ref 36.0–46.0)
Hemoglobin: 10.3 g/dL — ABNORMAL LOW (ref 12.0–15.0)
MCH: 28 pg (ref 26.0–34.0)
MCHC: 32.1 g/dL (ref 30.0–36.0)
MCV: 87.2 fL (ref 80.0–100.0)
Platelets: 339 10*3/uL (ref 150–400)
RBC: 3.68 MIL/uL — ABNORMAL LOW (ref 3.87–5.11)
RDW: 15.8 % — ABNORMAL HIGH (ref 11.5–15.5)
WBC: 12.8 10*3/uL — ABNORMAL HIGH (ref 4.0–10.5)
nRBC: 0.2 % (ref 0.0–0.2)

## 2023-12-19 LAB — COMPREHENSIVE METABOLIC PANEL WITH GFR
ALT: 48 U/L — ABNORMAL HIGH (ref 0–44)
AST: 78 U/L — ABNORMAL HIGH (ref 15–41)
Albumin: 2.5 g/dL — ABNORMAL LOW (ref 3.5–5.0)
Alkaline Phosphatase: 57 U/L (ref 38–126)
Anion gap: 12 (ref 5–15)
BUN: 29 mg/dL — ABNORMAL HIGH (ref 6–20)
CO2: 14 mmol/L — ABNORMAL LOW (ref 22–32)
Calcium: 8.5 mg/dL — ABNORMAL LOW (ref 8.9–10.3)
Chloride: 114 mmol/L — ABNORMAL HIGH (ref 98–111)
Creatinine, Ser: 0.68 mg/dL (ref 0.44–1.00)
GFR, Estimated: >60 mL/min (ref >60)
Glucose, Bld: 130 mg/dL — ABNORMAL HIGH (ref 70–99)
Potassium: 3.5 mmol/L (ref 3.5–5.1)
Sodium: 140 mmol/L (ref 135–145)
Total Bilirubin: 0.4 mg/dL (ref 0.0–1.2)
Total Protein: 5 g/dL — ABNORMAL LOW (ref 6.5–8.1)

## 2023-12-19 LAB — GLUCOSE, CAPILLARY
Glucose-Capillary: 114 mg/dL — ABNORMAL HIGH (ref 70–99)
Glucose-Capillary: 129 mg/dL — ABNORMAL HIGH (ref 70–99)
Glucose-Capillary: 127 mg/dL — ABNORMAL HIGH (ref 70–99)
Glucose-Capillary: 157 mg/dL — ABNORMAL HIGH (ref 70–99)
Glucose-Capillary: 160 mg/dL — ABNORMAL HIGH (ref 70–99)

## 2023-12-19 MED ORDER — SODIUM BICARBONATE 650 MG PO TABS
650.0000 mg | ORAL_TABLET | Freq: Two times a day (BID) | ORAL | Status: DC
  Administered 2023-12-19 – 2023-12-24 (×11): 650 mg
  Filled 2023-12-19 (×11): qty 1

## 2023-12-19 NOTE — Progress Notes (Signed)
 Patient pullled out her cortrek at 1130 am.  Dr Nichole Barker notified via text message.  Cortrek replaced and tube feeding restarted.  Rt wrist restraint reordered.

## 2023-12-19 NOTE — Progress Notes (Signed)
 Daily Progress Note   Patient Name: Meredith Park       Date: 12/19/2023 DOB: 02/08/1971  Age: 53 y.o. MRN#: [id number] Attending Physician: Feliciana Horn, MD Primary Care Physician: Eulalio Hibbs, PA-C Admit Date: 11/27/2023  Reason for Consultation/Follow-up: Establishing goals of care  Subjective: AM: Medical records reviewed including progress notes, labs and imaging. Patient assessed at the bedside.  She is calm, denies pain or distress.  Confirms by nodding when told about upcoming family meeting.  PM: Met with patient's mother [name] and daughter Kalen to discuss goals of care.  We reviewed role of palliative medicine team as an extra layer of support.  Provided medical update on current treatment plan, next steps.  Reviewed typical trajectory including rehab.  Explored patient's thoughts and feelings on interventions that may be required including PEG tube.  Risks and benefits were discussed.  Patient would be in agreement if this was necessary and family are also supportive of this in order to allow patient more time for recovery.  She is also open to receiving aggressive life-prolonging interventions including CPR, intubation if required.  Patient's quality of life was discussed.  She was losing weight due to decreased appetite on AEDs but finally began driving again and was back to work for 2 or 3 weeks before this acute illness.  She is a Quarry manager at [employer].  She enjoys playing Pokmon go and spending time with her 2 grandchildren.  She has been wanting to move out of her house for years due to his condition.  She has 2 pugs and a cat.  It would be wonderful if she could get back to work again and spend more time with her family.  Patient also has some lower abdominal pain today, which was not present earlier  this morning.   MOST form was introduced.  Advanced directive/living will was introduced.  We discussed the [place] hierarchy for next of kin.  Patient also has a son who is not involved and has his own difficulties from schizophrenia.  He is with his father.  Milda Aline is divorced.  Questions and concerns addressed. PMT will continue to support holistically.   Length of Stay: 22   Physical Exam Vitals and nursing note reviewed.  Constitutional:      General: She is not in acute distress.    Appearance: She is ill-appearing.  Cardiovascular:     Rate and Rhythm: Normal rate.  Pulmonary:     Effort: Pulmonary effort is normal.  Neurological:     Mental Status: She is alert.  Psychiatric:  Mood and Affect: Affect is tearful.        Behavior: Behavior is cooperative.             Vital Signs: BP (!) 112/91 (BP Location: Left Arm)   Pulse 83   Temp 98.1 F (36.7 C) (Axillary)   Resp 18   Ht 5' 2.01" (1.575 m)   Wt 63.1 kg   LMP 08/28/2020   SpO2 100%   BMI 25.44 kg/m  SpO2: SpO2: 100 % O2 Device: O2 Device: Room Air O2 Flow Rate: O2 Flow Rate (L/min): 4 L/min      Palliative Assessment/Data: 30% (on tube feeds)    Palliative Care Assessment & Plan   Patient Profile: 53 y.o. female  with past medical history of seizure disorder on multiple antiepileptics, previous alcohol use sober for 1 year, marijuana use, hypertension, hyperlipidemia, admitted on 11/27/2023 with seizures after being found down by family (mother).    Patient was admitted for status epilepticus and CCM was consulted for acute respiratory failure and intubation.  3/25 admit to ICU.  Hypoxemic and unresponsive.  Intubated 3/26 LP performed. Increasing pressor requirement 3/27 Increased vasopressor requirement with worsening MOD including AKI, shock liver. Echo with stress CM. 3/29 Improved vasopressor requirements 4/2 central line d/c, extubated (had cuff leak), reintubated for stridor & secretions several  hours later 4/5 CTH w/progression of unihemispheric edema, thought more c/w seizure changes 4/6 LTM, steroids, 4/7 extubated, intermittently on predex for agitation, LTM d/c'd, tmax 101.6/ WBC stable 4/10 on room air.  Transferred to medical floor.  Core track feeding. 4/11 full of secretions unable to clear , requiring non- rebreather, transferred back to ICU 4/14: Transferred out of ICU, TRH 4/14 PMT has been consulted to assist with goals of care conversation.  Assessment: Goals of care conversation Acute respiratory failure, improving Septic shock secondary to aspiration pneumonia Seizure disorder Acute encephalopathy secondary to status epilepticus, improving Right cerebral hemisphere edema/postictal state AKI, resolved New cardiomyopathy  Recommendations/Plan: Continue full code/full scope treatment Patient is open to aggressive life-prolonging interventions including PEG Goal is to recover as close to previous baseline as she can MOST form and advance directives were introduced Psychosocial and emotional support provided Patient to continue to follow and support   Prognosis:  Unable to determine  Discharge Planning: To Be Determined  Care plan was discussed with patient, patient's daughter, patient's mother, RN, RD, MD   Total time: I spent 65 minutes in the care of the patient today in the above activities and documenting the encounter.          Keyshaun Exley P Ulysee Fyock, PA-C  Palliative Medicine Team Team phone # [phone]  Thank you for allowing the Palliative Medicine Team to assist in the care of this patient. Please utilize secure chat with additional questions, if there is no response within 30 minutes please call the above phone number.  Palliative Medicine Team providers are available by phone from 7am to 7pm daily and can be reached through the team cell phone.  Should this patient require assistance outside of these hours, please call the patient's  attending physician.

## 2023-12-19 NOTE — Procedures (Signed)
 Cortrak  Person Inserting Tube:  Edwena Graham D, RD Tube Type:  Cortrak - 43 inches Tube Size:  10 Tube Location:  Left nare Initial Placement:  Stomach Secured by: Bridle Technique Used to Measure Tube Placement:  Marking at nare/corner of mouth Cortrak Secured At:  78 cm Procedure Comments:  Cortrak Tube Team Note:  Consult received to place a Cortrak feeding tube.   No x-ray is required. RN may begin using tube.   If the tube becomes dislodged please keep the tube and contact the Cortrak team at [email or web address] for replacement.  If after hours and replacement cannot be delayed, place a NG tube and confirm placement with an abdominal x-ray.    Edwena Graham, RD, LDN Registered Dietitian II Please reach out via secure chat

## 2023-12-19 NOTE — Progress Notes (Signed)
 Physical Therapy Treatment Patient Details Name: Meredith Park MRN: [id number] DOB: [birth year] Today's Date: 12/19/2023   History of Present Illness 53 yo female admitted 11/27/23 s/p seizure with acute encephalopathy, acute respiratory failure, septic shock secondary to aspiration PNA. LP 3/26, ETT 3/26-4/2, re-intubated 4/2. Extubated 4/7. PMH: HTN, HLD ETOH abuse hx, seizure    PT Comments  Pt is slowly progressing towards goals. Currently pt is Max A for rolling, Max A +2 for supine<> sitting, 2 person Min A for sit to stand and currently unable to progress gait. Due to pt current functional status, home set up and available assistance at home recommending skilled physical therapy services < 3 hours/day in order to address strength, balance and functional mobility to decrease risk for falls, injury, immobility, skin break down and re-hospitalization.      If plan is discharge home, recommend the following: Two people to help with walking and/or transfers;Assistance with cooking/housework;Assist for transportation;Help with stairs or ramp for entrance   Can travel by private vehicle     No  Equipment Recommendations  Wheelchair cushion (measurements PT);Wheelchair (measurements PT);Hospital bed;Hoyer lift (recline back W/C with leg rests)       Precautions / Restrictions Precautions Precautions: Fall;Other (comment) Recall of Precautions/Restrictions: Impaired Precaution/Restrictions Comments: rectal pouch,cortrak, R wrist restraint Restrictions Weight Bearing Restrictions Per Provider Order: No     Mobility  Bed Mobility Overal bed mobility: Needs Assistance Bed Mobility: Rolling, Sidelying to Sit, Sit to Supine Rolling: Max assist Sidelying to sit: Max assist, +2 for physical assistance   Sit to supine: Max assist, +2 for physical assistance   General bed mobility comments: Max A with multi modal cues for supine to sitting with Assist at trunk and Bil LE. Pt is able to  assist with RLE with tactile cues for initiation.    Transfers Overall transfer level: Needs assistance Equipment used: 2 person hand held assist Transfers: Sit to/from Stand Sit to Stand: Min assist, +2 physical assistance           General transfer comment: Pt was able to stand at EOB for ~ 30 seconds wtih Min A; decreased L Push in standing, pt requires assist for positioning/biomechanics, initiation and motor planning for sitting <> standing.    Ambulation/Gait     General Gait Details: unable    Modified Rankin (Stroke Patients Only) Modified Rankin (Stroke Patients Only) Pre-Morbid Rankin Score: No symptoms Modified Rankin: Severe disability     Balance Overall balance assessment: Needs assistance Sitting-balance support: Feet supported Sitting balance-Leahy Scale: Poor Sitting balance - Comments: heavy L lean requiring Max A with intermittent Min A with hand positioning on the R knee. Postural control: Left lateral lean Standing balance support: Bilateral upper extremity supported Standing balance-Leahy Scale: Poor Standing balance comment: requires external support to remain standing.        Communication Communication Communication: Impaired Factors Affecting Communication: Reduced clarity of speech  Cognition Arousal: Alert Behavior During Therapy: Flat affect   PT - Cognitive impairments: Difficult to assess Difficult to assess due to: Impaired communication       PT - Cognition Comments: attempting to mouth words. Hypophonic voice. Following commands: Impaired Following commands impaired: Follows one step commands with increased time    Cueing Cueing Techniques: Verbal cues, Tactile cues, Gestural cues     General Comments General comments (skin integrity, edema, etc.): no signs/symptoms of cardiac/respiratory distress during session.      Pertinent Vitals/Pain Pain Assessment Pain Assessment: Faces Faces Pain  Scale: No hurt Pain  Intervention(s): Monitored during session           PT Goals (current goals can now be found in the care plan section) Acute Rehab PT Goals Patient Stated Goal: unable to state PT Goal Formulation: With patient/family Time For Goal Achievement: 12/21/23 Potential to Achieve Goals: Fair Progress towards PT goals: Progressing toward goals    Frequency    Min 2X/week      PT Plan  Continue with current POC     Co-evaluation PT/OT/SLP Co-Evaluation/Treatment: Yes Reason for Co-Treatment: Complexity of the patient's impairments (multi-system involvement);Necessary to address cognition/behavior during functional activity;To address functional/ADL transfers;For patient/therapist safety PT goals addressed during session: Mobility/safety with mobility;Balance        AM-PAC PT "6 Clicks" Mobility   Outcome Measure  Help needed turning from your back to your side while in a flat bed without using bedrails?: A Lot Help needed moving from lying on your back to sitting on the side of a flat bed without using bedrails?: A Lot Help needed moving to and from a bed to a chair (including a wheelchair)?: Total Help needed standing up from a chair using your arms (e.g., wheelchair or bedside chair)?: A Lot Help needed to walk in hospital room?: Total Help needed climbing 3-5 steps with a railing? : Total 6 Click Score: 9    End of Session Equipment Utilized During Treatment: Gait belt Activity Tolerance: Patient tolerated treatment well Patient left: in bed;with call bell/phone within reach;with bed alarm set;with restraints reapplied Nurse Communication: Mobility status PT Visit Diagnosis: Muscle weakness (generalized) (M62.81);Difficulty in walking, not elsewhere classified (R26.2);Other symptoms and signs involving the nervous system (R29.898)     Time: 1610-9604 PT Time Calculation (min) (ACUTE ONLY): 22 min  Charges:    $Therapeutic Activity: 8-22 mins PT General Charges $$  ACUTE PT VISIT: 1 Visit                     Meredith Park, DPT, CLT  Acute Rehabilitation Services Office: [phone] (Secure chat preferred)    Meredith Park 12/19/2023, 3:50 PM

## 2023-12-19 NOTE — Progress Notes (Signed)
 [facility]                                                                               Meredith Park, is a 53 y.o. female, DOB - 03/13/1971, RUE:[id number] Admit date - 11/27/2023    Outpatient Primary MD for the patient is Fulbright, [place]  E, PA-C  LOS - 22  days    Brief summary   53 year old female with history of alcohol abuse, HLD, seizures, hypertension, abstinence from alcohol for last 1 year, found down beside her bed by her family members, was noted to be shaking and unresponsive.  EMS was called and was found to be in generalized tonic-clonic seizures, treated with Versed with cessation of seizures, remained minimally responsive in the ED.  Hypoxemic requiring 10 L O2 and was intubated to protect airway and admitted to the ICU. Significant events as below. 3/25 admit to ICU.  Hypoxemic and unresponsive.  Intubated 3/26 LP performed. Increasing pressor requirement 3/27 Increased vasopressor requirement with worsening MOD including AKI, shock liver. Echo with stress CM. 3/29 Improved vasopressor requirements 4/2 central line d/c, extubated (had cuff leak), reintubated for stridor & secretions several hours later 4/5 CTH w/progression of unihemispheric edema, thought more c/w seizure changes 4/6 LTM, steroids, 4/7 extubated, intermittently on predex for agitation, LTM d/c'd, tmax 101.6/ WBC stable 4/10 on room air.  Transferred to medical floor.  Core track feeding. 4/11 full of secretions unable to clear , requiring non- rebreather, transferred back to ICU 4/14: Transferred out of ICU, TRH   Significant studies: Brain MRI 3/28 with extensive cortical restricted diffusion in the right cerebral hemisphere Brain MRI 4/6 with diffuse cortical edema and increased vascularity to the right hemisphere Spinal tap, no evidence of infection. EEG, postictal.     Assessment & Plan    Assessment and Plan:  Status epilepticus with history of seizure  disorder Acute encephalopathy secondary to status epilepticus Right cerebral hemisphere edema consistent with postictal state She continues to have left-sided weakness Neurology on board hold suspect this is postsurgical versus potential autoimmune/paraneoplastic/vasculitis. CT of the chest abdomen and pelvis without any signs of malignancy Neurology recommends to continue with Topamax, lamotrigine and DC Vimpat Prednisone taper by 10 mg every week.  And repeat MRI    Acute hypoxic respiratory failure secondary to aspiration versus volume overload -Patient was initially intubated for status epilepticus and airway protection, subsequently reintubated due to aspiration versus volume overload, has been extubated - On 4/11, was transferred back to ICU for increasing hypoxia, placed on high flow O2 via [place], placed on diuresis, received Lasix last 40 mg IV x 1 on 4/12 - O2 sats 100% on room air  - Continue PT OT, I-S, SLP - Continue IV Unasyn for aspiration pneumonia  New leukocytosis this am, will gt repeat CXR.    Dysphagia/aspiration Patient on core track, failed SLP evaluation Palliative medicine consulted for goals of care/PEG tubes   New onset cardiomyopathy, elevated troponin likely due to demand ischemia Hypertension - 2D echo 11/28/2023 showed EF of 40 to 45%, G1 DD, regional WMA -Seen by cardiology, recommending conservative management - BP improving, GDMT limited by hypotension, dysphagia -Will start Coreg  3.125 mg twice daily, ACE/ARB once tolerated with BP   Acute Kidney injury secondary to rhabdomyolysis, ATN, metabolic acidosis Creatinine is back to baseline,  Bicarb low. Will add sodium bicarbonate.     Hypernatremia.  Improved   Anemia of acute illness Monitor.    Acute transaminitis Improving   Hyperglycemia probably secondary to steroids and tube feeds Continue with sliding scale insulin.   RN Pressure Injury Documentation:    Malnutrition  Type:  Nutrition Problem: Severe Malnutrition Etiology: social / environmental circumstances   Malnutrition Characteristics:  Signs/Symptoms: severe muscle depletion, percent weight loss   Nutrition Interventions:  Interventions: Prostat, Tube feeding, MVI  Estimated body mass index is 25.44 kg/m as calculated from the following:   Height as of this encounter: 5' 2.01" (1.575 m).   Weight as of this encounter: 63.1 kg.  Code Status: full code.  DVT Prophylaxis:  heparin injection 5,000 Units Start: 12/04/23 0930 SCDs Start: 11/27/23 2310   Level of Care: Level of care: Progressive Family Communication: none at bedside.   Disposition Plan:     Remains inpatient appropriate:  pending clinical improvement.   Procedures:    Consultants:   Neurology.   Antimicrobials:   Anti-infectives (From admission, onward)    Start     Dose/Rate Route Frequency Ordered Stop   12/14/23 1245  Ampicillin-Sulbactam (UNASYN) 3 g in sodium chloride 0.9 % 100 mL IVPB        3 g 200 mL/hr over 30 Minutes Intravenous Every 6 hours 12/14/23 1154 12/19/23 0640   12/04/23 0930  piperacillin-tazobactam (ZOSYN) IVPB 3.375 g  Status:  Discontinued        3.375 g 12.5 mL/hr over 240 Minutes Intravenous Every 8 hours 12/04/23 0839 12/10/23 1048   11/30/23 1000  cefTRIAXone (ROCEPHIN) 2 g in sodium chloride 0.9 % 100 mL IVPB  Status:  Discontinued        2 g 200 mL/hr over 30 Minutes Intravenous Every 24 hours 11/29/23 1006 12/04/23 0830   11/29/23 1000  vancomycin (VANCOREADY) IVPB 750 mg/150 mL  Status:  Discontinued        750 mg 150 mL/hr over 60 Minutes Intravenous Every 24 hours 11/28/23 0245 11/29/23 1006   11/28/23 2200  acyclovir (ZOVIRAX) 630 mg in dextrose 5 % 100 mL IVPB  Status:  Discontinued        10 mg/kg  63 kg 112.6 mL/hr over 60 Minutes Intravenous Every 24 hours 11/28/23 0247 11/29/23 0836   11/28/23 1000  ampicillin (OMNIPEN) 2 g in sodium chloride 0.9 % 100 mL IVPB  Status:   Discontinued        2 g 300 mL/hr over 20 Minutes Intravenous Every 8 hours 11/28/23 0249 11/29/23 1006   11/28/23 0115  vancomycin (VANCOREADY) IVPB 2000 mg/400 mL        2,000 mg 200 mL/hr over 120 Minutes Intravenous  Once 11/28/23 0028 11/28/23 0308   11/28/23 0115  cefTRIAXone (ROCEPHIN) 2 g in sodium chloride 0.9 % 100 mL IVPB  Status:  Discontinued        2 g 200 mL/hr over 30 Minutes Intravenous Every 12 hours 11/28/23 0028 11/29/23 1006   11/28/23 0115  ampicillin (OMNIPEN) 2 g in sodium chloride 0.9 % 100 mL IVPB        2 g 300 mL/hr over 20 Minutes Intravenous  Once 11/28/23 0028 11/28/23 0129   11/28/23 0100  acyclovir (ZOVIRAX) 700 mg in dextrose 5 % 100 mL IVPB  700 mg 114 mL/hr over 60 Minutes Intravenous  Once 11/28/23 0029 11/28/23 0301        Medications  Scheduled Meds:  acetaminophen (TYLENOL) oral liquid 160 mg/5 mL  650 mg Per Tube Q6H   carvedilol  3.125 mg Per Tube BID WC   Chlorhexidine Gluconate Cloth  6 each Topical Daily   feeding supplement (PROSource TF20)  60 mL Per Tube BID   fiber supplement (BANATROL TF)  60 mL Per Tube BID   folic acid  1 mg Per Tube Daily   free water  200 mL Per Tube Q3H   heparin injection (subcutaneous)  5,000 Units Subcutaneous Q8H   insulin aspart  0-9 Units Subcutaneous Q4H   lamoTRIgine  250 mg Per Tube BID   multivitamin with minerals  1 tablet Per Tube Daily   nystatin cream   Topical BID   mouth rinse  15 mL Mouth Rinse 4 times per day   pantoprazole (PROTONIX) IV  40 mg Intravenous QHS   predniSONE  50 mg Oral Q breakfast   Followed by   Melene Muller ON 12/25/2023] predniSONE  40 mg Oral Q breakfast   Followed by   Melene Muller ON 01/01/2024] predniSONE  30 mg Oral Q breakfast   Followed by   Melene Muller ON 01/08/2024] predniSONE  20 mg Oral Q breakfast   Followed by   Melene Muller ON 01/15/2024] predniSONE  10 mg Oral Q breakfast   QUEtiapine  50 mg Per Tube QHS   thiamine  100 mg Per Tube Daily   topiramate  100 mg Per Tube  QHS   Continuous Infusions:  feeding supplement (OSMOLITE 1.5 CAL) 50 mL/hr at 12/19/23 0550   PRN Meds:.docusate sodium, hydrALAZINE, hydrocortisone cream, ipratropium-albuterol, LORazepam, mouth rinse, polyethylene glycol    Subjective:   Meredith Park was seen and examined today.  Patient pulled out the core track earlier this morning and will be replaced.  No acute events overnight no nausea or vomiting  Objective:   Vitals:   12/19/23 0444 12/19/23 0500 12/19/23 0726 12/19/23 1143  BP: (!) 113/93  (!) 112/91 (!) 138/97  Pulse: 96  83 94  Resp: 18  18 18   Temp: 97.8 F (36.6 C)  98.1 F (36.7 C) 98.4 F (36.9 C)  TempSrc: Oral  Axillary Oral  SpO2: 100%  100% 99%  Weight:  63.1 kg    Height:        Intake/Output Summary (Last 24 hours) at 12/19/2023 1327 Last data filed at 12/19/2023 0550 Gross per 24 hour  Intake 1041.67 ml  Output 2450 ml  Net -1408.33 ml   Filed Weights   12/17/23 0454 12/18/23 0420 12/19/23 0500  Weight: 63.1 kg 62.3 kg 63.1 kg     Exam General exam: Appears calm and comfortable  Respiratory system: Clear to auscultation. Respiratory effort normal. Cardiovascular system: S1 & S2 heard, RRR. Gastrointestinal system: Abdomen is nondistended, soft and nontender.  Central nervous system: Alert  wit left sided weakness.  Extremities: no edema.  Skin: No rashes, lesions or ulcers Psychiatry: flat affect.     Data Reviewed:  I have personally reviewed following labs and imaging studies   CBC Lab Results  Component Value Date   WBC 12.8 (H) 12/19/2023   RBC 3.68 (L) 12/19/2023   HGB 10.3 (L) 12/19/2023   HCT 32.1 (L) 12/19/2023   MCV 87.2 12/19/2023   MCH 28.0 12/19/2023   PLT 339 12/19/2023   MCHC 32.1 12/19/2023   RDW 15.8 (  H) 12/19/2023   LYMPHSABS 1.5 12/17/2023   MONOABS 0.6 12/17/2023   EOSABS 0.1 12/17/2023   BASOSABS 0.0 12/17/2023     Last metabolic panel Lab Results  Component Value Date   NA 140 12/19/2023   K  3.5 12/19/2023   CL 114 (H) 12/19/2023   CO2 14 (L) 12/19/2023   BUN 29 (H) 12/19/2023   CREATININE 0.68 12/19/2023   GLUCOSE 130 (H) 12/19/2023   GFRNONAA >60 12/19/2023   GFRAA >60 11/10/2018   CALCIUM 8.5 (L) 12/19/2023   PHOS 4.0 12/12/2023   PROT 5.0 (L) 12/19/2023   ALBUMIN 2.5 (L) 12/19/2023   BILITOT 0.4 12/19/2023   ALKPHOS 57 12/19/2023   AST 78 (H) 12/19/2023   ALT 48 (H) 12/19/2023   ANIONGAP 12 12/19/2023    CBG (last 3)  Recent Labs    12/19/23 0449 12/19/23 0725 12/19/23 1138  GLUCAP 114* 129* 127*      Coagulation Profile: No results for input(s): "INR", "PROTIME" in the last 168 hours.   Radiology Studies: No results found.     Feliciana Horn M.D. [facility] 12/19/2023, 1:27 PM  Available via Epic secure chat 7am-7pm After 7 pm, please refer to night coverage provider listed on amion.

## 2023-12-19 NOTE — Progress Notes (Signed)
 Occupational Therapy Treatment Patient Details Name: Meredith Park MRN: [id number] DOB: [birth year] Today's Date: 12/19/2023   History of present illness 53 yo female admitted 11/27/23 s/p seizure with acute encephalopathy, acute respiratory failure, septic shock secondary to aspiration PNA. LP 3/26, ETT 3/26-4/2, re-intubated 4/2. Extubated 4/7. PMH: HTN, HLD ETOH abuse hx, seizure   OT comments  Good cotreat session with PT. Required Max A +2 to progress toward EOB and Max A to maintain midline postural control due to pushing L, however able to stand with min A +2 and maintain standing without buckling noted LLE. Assistance for initiation of tasks, then pt able to complete grooming tasks with min A. At end of session pt moving her head to the beat of country music and occasional singing along to songs. Would benefit form continued cotreat to attempt to advance mobility/ambulation attemtps. Patient will benefit from continued inpatient follow up therapy, <3 hours/day to maximize functional level of independence. Acute OT to follow.  PLEASE KEEP LUE ELEVATED ON 2 PILLOWS TO REDUCE DEPENDENT EDEMA.       If plan is discharge home, recommend the following:  Two people to help with walking and/or transfers;Two people to help with bathing/dressing/bathroom   Equipment Recommendations  Wheelchair (measurements OT);Wheelchair cushion (measurements OT);Hospital bed;Hoyer lift;BSC/3in1    Recommendations for Other Services      Precautions / Restrictions Precautions Precautions: Fall;Other (comment) Recall of Precautions/Restrictions: Impaired Precaution/Restrictions Comments: rectal pouch,cortrak, R wrist restraint       Mobility Bed Mobility Overal bed mobility: Needs Assistance Bed Mobility: Rolling, Sidelying to Sit, Sit to Supine Rolling: Max assist Sidelying to sit: Max assist, +2 for physical assistance   Sit to supine: Max assist, +2 for physical assistance   General bed  mobility comments: Max A with multi modal cues for supine to sitting with Assist at trunk and Bil LE. Pt is able to assist with RLE with tactile cues for initiation.    Transfers Overall transfer level: Needs assistance Equipment used: 2 person hand held assist Transfers: Sit to/from Stand Sit to Stand: Min assist, +2 physical assistance           General transfer comment: Pt was able to stand at EOB for ~ 30 seconds wtih Min A; decreased L Push in standing, pt requires assist for positioning/biomechanics, initiation and motor planning for sitting <> standing.     Balance Overall balance assessment: Needs assistance Sitting-balance support: Feet supported Sitting balance-Leahy Scale: Poor Sitting balance - Comments: heavy L lean requiring Max A with intermittent Min A with hand positioning on the R knee. Postural control: Left lateral lean Standing balance support: Bilateral upper extremity supported Standing balance-Leahy Scale: Poor Standing balance comment: requires external support to remain standing.                           ADL either performed or assessed with clinical judgement   ADL Overall ADL's : Needs assistance/impaired     Grooming: Minimal assistance Grooming Details (indicate cue type and reason): nmin A to initiate activity then pt able to brush her tooth with [place] Upper Body Bathing: Maximal assistance   Lower Body Bathing: Maximal assistance               Toileting- Clothing Manipulation and Hygiene: Total assistance (rectal foley)              Extremity/Trunk Assessment Upper Extremity Assessment Upper Extremity Assessment: Right hand dominant;LUE deficits/detail  LUE Deficits / Details: flaccid LUE; PROM WFL; edematous L hand in slint form being in a dependent position LUE Coordination: decreased fine motor;decreased gross motor            Vision   Eye Alignment: Impaired (comment) Additional Comments: R gaze  preference   Perception Perception Perception: Impaired Preception Impairment Details: Inattention/Neglect;Spatial orientation (L) Perception-Other Comments: poor midline awareness; pushing L in sitting   Praxis     Communication Communication Communication: Impaired Factors Affecting Communication: Difficulty expressing self;Reduced clarity of speech   Cognition Arousal: Alert Behavior During Therapy: Flat affect Cognition: Cognition impaired Difficult to assess due to: Impaired communication   Awareness: Intellectual awareness impaired, Online awareness impaired   Attention impairment (select first level of impairment): Selective attention                     Following commands: Impaired Following commands impaired: Follows one step commands with increased time      Cueing   Cueing Techniques: Verbal cues, Tactile cues, Visual cues  Exercises      Shoulder Instructions       General Comments no signs/symptoms of cardiac/respiratory distress during session. TOLERATING SPLINT without issues    Pertinent Vitals/ Pain       Pain Assessment Pain Assessment: Faces Faces Pain Scale: No hurt  Home Living                                          Prior Functioning/Environment              Frequency  Min 2X/week        Progress Toward Goals  OT Goals(current goals can now be found in the care plan section)  Progress towards OT goals: Progressing toward goals  Acute Rehab OT Goals Patient Stated Goal: listen to music OT Goal Formulation: Patient unable to participate in goal setting Time For Goal Achievement: 01/01/24 Potential to Achieve Goals: Fair ADL Goals Pt Will Perform Grooming: with set-up;sitting Pt Will Perform Upper Body Bathing: with min assist;sitting Pt Will Transfer to Toilet: with max assist;bedside commode;stand pivot transfer Additional ADL Goal #1: pt will don resting hand splint with mod (A) to LUE  Plan       Co-evaluation    PT/OT/SLP Co-Evaluation/Treatment: Yes Reason for Co-Treatment: Complexity of the patient's impairments (multi-system involvement);For patient/therapist safety;To address functional/ADL transfers PT goals addressed during session: Mobility/safety with mobility;Balance OT goals addressed during session: ADL's and self-care;Strengthening/ROM      AM-PAC OT "6 Clicks" Daily Activity     Outcome Measure   Help from another person eating meals?: Total (coretrack) Help from another person taking care of personal grooming?: A Lot Help from another person toileting, which includes using toliet, bedpan, or urinal?: Total Help from another person bathing (including washing, rinsing, drying)?: A Lot Help from another person to put on and taking off regular upper body clothing?: A Lot Help from another person to put on and taking off regular lower body clothing?: Total 6 Click Score: 9    End of Session    OT Visit Diagnosis: Unsteadiness on feet (R26.81);Muscle weakness (generalized) (M62.81);Other symptoms and signs involving cognitive function;Other symptoms and signs involving the nervous system (R29.898);Other abnormalities of gait and mobility (R26.89);Hemiplegia and hemiparesis Hemiplegia - Right/Left: Left Hemiplegia - dominant/non-dominant: Non-Dominant Hemiplegia - caused by: Unspecified   Activity Tolerance Patient  tolerated treatment well   Patient Left in bed;with call bell/phone within reach;with bed alarm set   Nurse Communication Mobility status;Other (comment) (keep LUE elevated on pillos to reduce dependent edema)        Time: 1459-1531 OT Time Calculation (min): 32 min  Charges: OT Treatments $Self Care/Home Management : 8-22 mins  Milburn Aliment, OT/L   Acute OT Clinical Specialist Acute Rehabilitation Services Pager [phone] Office [phone]   [facility] 12/19/2023, 4:02 PM

## 2023-12-20 ENCOUNTER — Inpatient Hospital Stay (HOSPITAL_COMMUNITY)

## 2023-12-20 DIAGNOSIS — J9601 Acute respiratory failure with hypoxia: Secondary | ICD-10-CM | POA: Diagnosis not present

## 2023-12-20 DIAGNOSIS — G936 Cerebral edema: Secondary | ICD-10-CM | POA: Diagnosis not present

## 2023-12-20 DIAGNOSIS — N179 Acute kidney failure, unspecified: Secondary | ICD-10-CM | POA: Diagnosis not present

## 2023-12-20 DIAGNOSIS — R569 Unspecified convulsions: Secondary | ICD-10-CM | POA: Diagnosis not present

## 2023-12-20 DIAGNOSIS — I214 Non-ST elevation (NSTEMI) myocardial infarction: Secondary | ICD-10-CM | POA: Diagnosis not present

## 2023-12-20 DIAGNOSIS — I5021 Acute systolic (congestive) heart failure: Secondary | ICD-10-CM | POA: Diagnosis not present

## 2023-12-20 DIAGNOSIS — K7200 Acute and subacute hepatic failure without coma: Secondary | ICD-10-CM | POA: Diagnosis not present

## 2023-12-20 DIAGNOSIS — E43 Unspecified severe protein-calorie malnutrition: Secondary | ICD-10-CM | POA: Diagnosis not present

## 2023-12-20 DIAGNOSIS — G9340 Encephalopathy, unspecified: Secondary | ICD-10-CM | POA: Diagnosis not present

## 2023-12-20 DIAGNOSIS — G40901 Epilepsy, unspecified, not intractable, with status epilepticus: Secondary | ICD-10-CM | POA: Diagnosis not present

## 2023-12-20 LAB — CBC WITH DIFFERENTIAL/PLATELET
Abs Immature Granulocytes: 0 10*3/uL (ref 0.00–0.07)
Basophils Absolute: 0 10*3/uL (ref 0.0–0.1)
Basophils Relative: 0 %
Eosinophils Absolute: 0 10*3/uL (ref 0.0–0.5)
Eosinophils Relative: 0 %
HCT: 34.3 % — ABNORMAL LOW (ref 36.0–46.0)
Hemoglobin: 11 g/dL — ABNORMAL LOW (ref 12.0–15.0)
Lymphocytes Relative: 16 %
Lymphs Abs: 2.8 10*3/uL (ref 0.7–4.0)
MCH: 27.8 pg (ref 26.0–34.0)
MCHC: 32.1 g/dL (ref 30.0–36.0)
MCV: 86.8 fL (ref 80.0–100.0)
Monocytes Absolute: 0.7 10*3/uL (ref 0.1–1.0)
Monocytes Relative: 4 %
Neutro Abs: 14 10*3/uL — ABNORMAL HIGH (ref 1.7–7.7)
Neutrophils Relative %: 80 %
Platelets: 323 10*3/uL (ref 150–400)
RBC: 3.95 MIL/uL (ref 3.87–5.11)
RDW: 16 % — ABNORMAL HIGH (ref 11.5–15.5)
WBC: 17.5 10*3/uL — ABNORMAL HIGH (ref 4.0–10.5)
nRBC: 0.6 % — ABNORMAL HIGH (ref 0.0–0.2)
nRBC: 1 /100{WBCs} — ABNORMAL HIGH

## 2023-12-20 LAB — GLUCOSE, CAPILLARY
Glucose-Capillary: 105 mg/dL — ABNORMAL HIGH (ref 70–99)
Glucose-Capillary: 105 mg/dL — ABNORMAL HIGH (ref 70–99)
Glucose-Capillary: 106 mg/dL — ABNORMAL HIGH (ref 70–99)
Glucose-Capillary: 121 mg/dL — ABNORMAL HIGH (ref 70–99)
Glucose-Capillary: 130 mg/dL — ABNORMAL HIGH (ref 70–99)
Glucose-Capillary: 141 mg/dL — ABNORMAL HIGH (ref 70–99)
Glucose-Capillary: 169 mg/dL — ABNORMAL HIGH (ref 70–99)

## 2023-12-20 NOTE — Progress Notes (Signed)
 Patient is kept NPO,  tube feeding withheld since 1015am for possible PEG placement today.  PA interventional radiology confirm PEG placement will not be done today.  Tube feeding resumed by RN.

## 2023-12-20 NOTE — Progress Notes (Signed)
 Speech Language Pathology Treatment: Dysphagia  Patient Details Name: Meredith Park MRN: [id number] DOB: 01/02/1971 Today's Date: 12/20/2023 Time: 4098-1191 SLP Time Calculation (min) (ACUTE ONLY): 40 min  Assessment / Plan / Recommendation Clinical Impression  Pt alert with significant left neglect and right gaze deviation. Whispering rapidly but language does appear clear. Pt saying, "They get mad at me if I try to get up" repeatedly and then responding to my questions about her name and her grandson Eli with multiple dysarthric and aphonic repetitions. Perseverative on the alarms and calling the RN by accident. Thinks staff will be mad at her. SLP able to cue her a bit to phonate, first with a throat clear and then some with cues for louder clearer language. Suspect whispering is cognitive more than vocal fold/laryngeal impairment. Worked on singing and awareness with some improvement.   With swallowing SLP introduced boluses of water siphoned from straw and cup edge. There is severe anterior spill on the left, but also initiation and swallowing. Pt able to purse lips to cup edge with sensory input, but still not sipping from a straw. Pt orally manipulates puree and swallows, also able to self feed with a spoon. Incorporating left hand can draw gaze to midline. More oral holding with self feeding bt to pt only placing bolus anteriorly and not getting enough sensation. Also suspect she struggles with dual task of self feeding and swallowing in this case; seems distracted. Pt has good potential for oral intake over the next few weeks with high frequency interventions, but unlikely to get adequate oral intake in the next few days. Discussed with MD.    HPI HPI: 53 yo female presenting s/p seizure with vomiting. Admitted with acute encephalopathy, acute respiratory failure, septic shock secondary to aspiration PNA. ETT 3/25-4/2; reintubated for stridor and secretions 4/2-4/7. MRI 4/6: diffuse  cortical edema and slight increased vascularity to the  right hemisphere, possibly postictal. PMH: HTN, HLD, ETOH abuse hx, seizure. Previous SLUMS 2023: 26/30 (errors primarily with delayed recall, mild word-finding).      SLP Plan         Recommendations for follow up therapy are one component of a multi-disciplinary discharge planning process, led by the attending physician.  Recommendations may be updated based on patient status, additional functional criteria and insurance authorization.    Recommendations  Diet recommendations: NPO                                    Kendryck Lacroix, Hardin Leys  12/20/2023, 9:56 AM

## 2023-12-20 NOTE — Progress Notes (Signed)
 Subjective: No acute events overnight.  More awake, alert, participating in conversations but still severely hypophonic.  ROS: negative except above  Examination  Vital signs in last 24 hours: Temp:  [97.7 F (36.5 C)-99 F (37.2 C)] 97.7 F (36.5 C) (04/17 0812) Pulse Rate:  [90-99] 93 (04/17 0812) Resp:  [14-18] 18 (04/17 0812) BP: (106-138)/(78-98) 127/95 (04/17 0812) SpO2:  [99 %-100 %] 99 % (04/17 0812) Weight:  [64 kg] 64 kg (04/17 0500)  General: lying in bed, NAD Neuro: Awake, alert, able to tell me her name and that she is at a hospital, month: May, however her voice is still severely hypophonic,  follows commands but has profound left hemineglect, antigravity strength in right upper and right lower extremity, withdraws to noxious stimuli in left upper and lower extremity with antigravity strength   Basic Metabolic Panel: Recent Labs  Lab 12/14/23 0555 12/15/23 0740 12/16/23 0743 12/19/23 0607  NA 150* 150* 146* 140  K 3.8 3.7 3.8 3.5  CL 125* 122* 120* 114*  CO2 14* 15* 16* 14*  GLUCOSE 139* 140* 146* 130*  BUN 54* 58* 52* 29*  CREATININE 1.03* 1.07* 1.00 0.68  CALCIUM 8.8* 8.7* 8.4* 8.5*  MG  --   --  2.4  --     CBC: Recent Labs  Lab 12/14/23 0555 12/17/23 0633 12/19/23 0607 12/20/23 0434  WBC 17.2* 9.2 12.8* 17.5*  NEUTROABS 15.6* 6.4  --  14.0*  HGB 11.0* 9.1* 10.3* 11.0*  HCT 34.2* 29.3* 32.1* 34.3*  MCV 87.7 89.1 87.2 86.8  PLT 550* 355 339 323     Coagulation Studies: No results for input(s): "LABPROT", "INR" in the last 72 hours.  Imaging No new brain imaging overnight  ASSESSMENT AND PLAN:53 y.o. female past medical history of alcohol and substance abuse, hypertension, seizures was found wedged between bed and nightstand and seizing for unknown duration.  Brought in after Versed in the field with resolution of clinical seizure activity.  Obtunded mentation after significant passage of time prompted further workup. MRI brain with cortical  changes in the right hemisphere and crossed cerebellar diaschisis, concerning more for a postictal phenomenology. EEG with LPD's likely ictal in nature.  CT angiogram was done-no emergent occlusion. CSF studies bland with no pleocytosis, mildly elevated protein, negative meningitis/encephalitis panel, no organisms on CSF gram stain and no growth on CSF cultures x 5 days. Exam remains poor despite resolution of ictal activity on the EEG with AED adjustments.  Repeat head CT done that shows extensive right hemispheric edema more than expected from postictal from neurology. Repeat MRI brain shows progression of earlier noted unihemispheric edema of the right cerebral hemisphere, which now includes most of the white matter. Completed 3 days of IV solumedrol 1000mg  .     Status epilepticus convulsive, resolved Acute encephalopathy , resolved - Etiology of her cortical unilateral brain edema remains unclear, still suspect this is post ictal but with progression of edema.  Other differentials include potential autoimmune/paraneoplastic/vasculitic pathology   - Serum Autoimmune encephalopathy panel, serum paraneoplastic encephalopathy panel pending. Anti TPO thyroglobulin antibodies negative - continue Topamax 100mg  daily, Lamotrigine 250mg  BID - Currently on prednisone taper at 50 mg with plan to decrease by 10 mg every week - Plan to repeat MRI brain today to look for any acute changes/improvement -Still has feeding tube.  Palliative care consult appreciated.  May need to discuss PEG tube -Management of medical comorbidities per primary team - Neurology will continue to follow peripherally.  Please  call us  for any further questions   I have spent a total of 35 minutes with the patient reviewing hospital notes,  test results, labs and examining the patient as well as establishing an assessment and plan. > 50% of time was spent in direct patient care.        [facility] For questions after 5pm please refer to AMION to reach the Neurologist on call

## 2023-12-20 NOTE — Progress Notes (Signed)
 Interventional Radiology Brief Note:  IR consulted for gastrostomy tube placement. Repeat CT Abdomen ordered for assessment of anatomy prior to planning placement in IR.  IR to follow.   Maysen Bonsignore, MS RD PA-C

## 2023-12-20 NOTE — Progress Notes (Signed)
 Daily Progress Note   Patient Name: Meredith Park       Date: 12/20/2023 DOB: [birth year]  Age: 53 y.o. MRN#: [id number] Attending Physician: Feliciana Horn, MD Primary Care Physician: Chancey Combe, [place]  E, PA-C Admit Date: 11/27/2023  Reason for Consultation/Follow-up: Establishing goals of care  Subjective: Medical records reviewed including progress notes, labs and imaging. Patient assessed at the bedside. Appears somewhat anxious. She has just returned from MRI. No family present during my visit.   Attempted to explore patient's thoughts and feelings after our family meeting yesterday. She pointed to her bedside table repeatedly, though did not confirm whether she was requesting any of the items I showed to her. Provided reassurance and emotional support. Shared that I would follow up with family regarding follow up questions from yesterday.  Called patient's mother for ongoing palliative support and provided a voicemail with PMT contact information.  Questions and concerns addressed. PMT will continue to support holistically.   Length of Stay: 23   Physical Exam Vitals and nursing note reviewed.  Constitutional:      General: She is not in acute distress.    Appearance: She is ill-appearing.  Cardiovascular:     Rate and Rhythm: Normal rate.  Pulmonary:     Effort: Pulmonary effort is normal.  Neurological:     Mental Status: She is alert.  Psychiatric:        Behavior: Behavior is cooperative.     Comments: Whispers repeated sounds             Vital Signs: BP (!) 127/95 (BP Location: Left Arm)   Pulse 93   Temp 97.7 F (36.5 C) (Oral)   Resp 18   Ht 5' 2.01" (1.575 m)   Wt 64 kg   LMP 08/28/2020   SpO2 99%   BMI 25.80 kg/m  SpO2: SpO2: 99 % O2 Device: O2 Device: Room Air O2 Flow Rate: O2 Flow Rate (L/min):  4 L/min      Palliative Assessment/Data: 30% (on tube feeds)    Palliative Care Assessment & Plan   Patient Profile: 53 y.o. female  with past medical history of seizure disorder on multiple antiepileptics, previous alcohol use sober for 1 year, marijuana use, hypertension, hyperlipidemia, admitted on 11/27/2023 with seizures after being found down by family (mother).    Patient was admitted for status epilepticus and CCM was consulted for acute respiratory failure and intubation.  3/25 admit to ICU.  Hypoxemic and unresponsive.  Intubated 3/26 LP performed. Increasing pressor requirement 3/27 Increased vasopressor requirement with worsening MOD including AKI, shock liver. Echo with stress CM. 3/29  Improved vasopressor requirements 4/2 central line d/c, extubated (had cuff leak), reintubated for stridor & secretions several hours later 4/5 CTH w/progression of unihemispheric edema, thought more c/w seizure changes 4/6 LTM, steroids, 4/7 extubated, intermittently on predex for agitation, LTM d/c'd, tmax 101.6/ WBC stable 4/10 on room air.  Transferred to medical floor.  Core track feeding. 4/11 full of secretions unable to clear , requiring non- rebreather, transferred back to ICU 4/14: Transferred out of ICU, TRH 4/14 PMT has been consulted to assist with goals of care conversation.  Assessment: Goals of care conversation Acute respiratory failure, improving Septic shock secondary to aspiration pneumonia Seizure disorder Acute encephalopathy secondary to status epilepticus, improving Right cerebral hemisphere edema/postictal state AKI, resolved New cardiomyopathy  Recommendations/Plan: Continue full code/full scope treatment Patient is open to aggressive life-prolonging interventions including PEG if necessary, but avoid if she can improve swallow with cortrak trial Goal is to recover as close to previous baseline as she can Psychosocial and emotional support provided PMT will  follow peripherally. I will be back on service 4/21. Please do not hesitate to reach out with urgent needs in the interim    Prognosis:  Unable to determine  Discharge Planning: To Be Determined  Care plan was discussed with patient  Total time: I spent 35 minutes in the care of the patient today in the above activities and documenting the encounter.          Rabecka Brendel P Dorri Ozturk, PA-C  Palliative Medicine Team Team phone # [phone]  Thank you for allowing the Palliative Medicine Team to assist in the care of this patient. Please utilize secure chat with additional questions, if there is no response within 30 minutes please call the above phone number.  Palliative Medicine Team providers are available by phone from 7am to 7pm daily and can be reached through the team cell phone.  Should this patient require assistance outside of these hours, please call the patient's attending physician.

## 2023-12-20 NOTE — Progress Notes (Signed)
 [facility]                                                                               Meredith Park, is a 53 y.o. female, DOB - September 20, 1970, ZOX:[id number] Admit date - 11/27/2023    Outpatient Primary MD for the patient is Piedad Climes, [place], PA-C  LOS - 23  days    Brief summary   53 year old female with history of alcohol abuse, HLD, seizures, hypertension, abstinence from alcohol for last 1 year, found down beside her bed by her family members, was noted to be shaking and unresponsive.  EMS was called and was found to be in generalized tonic-clonic seizures, treated with Versed with cessation of seizures, remained minimally responsive in the ED.  Hypoxemic requiring 10 L O2 and was intubated to protect airway and admitted to the ICU. Significant events as below. 3/25 admit to ICU.  Hypoxemic and unresponsive.  Intubated 3/26 LP performed. Increasing pressor requirement 3/27 Increased vasopressor requirement with worsening MOD including AKI, shock liver. Echo with stress CM. 3/29 Improved vasopressor requirements 4/2 central line d/c, extubated (had cuff leak), reintubated for stridor & secretions several hours later 4/5 CTH w/progression of unihemispheric edema, thought more c/w seizure changes 4/6 LTM, steroids, 4/7 extubated, intermittently on predex for agitation, LTM d/c'd, tmax 101.6/ WBC stable 4/10 on room air.  Transferred to medical floor.  Core track feeding. 4/11 full of secretions unable to clear , requiring non- rebreather, transferred back to ICU 4/14: Transferred out of ICU, TRH   Significant studies: Brain MRI 3/28 with extensive cortical restricted diffusion in the right cerebral hemisphere Brain MRI 4/6 with diffuse cortical edema and increased vascularity to the right hemisphere Spinal tap, no evidence of infection. EEG, postictal.   4/17 patient working with SLP making progress. PEG placement requested. Plan for repeat MRI brain today.    Assessment & Plan    Assessment and Plan:  Status epilepticus with history of seizure disorder Acute encephalopathy secondary to status epilepticus Right cerebral hemisphere edema consistent with postictal state She continues to have left-sided weakness Neurology on board suspect this is postsurgical versus potential autoimmune/paraneoplastic/vasculitis. CT of the chest abdomen and pelvis without any signs of malignancy Neurology recommends to continue with Topamax 100 mg daily, lamotrigine 250 mg BID and DC Vimpat Prednisone taper by 10 mg every week. Repeat MRI brain without contrast ordered and pending.     Acute hypoxic respiratory failure secondary to aspiration versus volume overload -Patient was initially intubated for status epilepticus and airway protection, subsequently reintubated due to aspiration versus volume overload, has been extubated - On 4/11, was transferred back to ICU for increasing hypoxia, placed on high flow O2 via [place], placed on diuresis, received Lasix last 40 mg IV x 1 on 4/12 - O2 sats 100% on room air  - Continue PT OT, I-S, SLP - completed course of antibiotics.  - repeat CXR wnl.    Dysphagia/aspiration Patient on core track, failed SLP evaluation Palliative medicine consulted for goals of care/PEG tubes. Continue with full scope treatments with aggressive life prolonging interventions including PEG.  PEG requested from IR.    New onset cardiomyopathy, elevated  troponin likely due to demand ischemia Hypertension - 2D echo 11/28/2023 showed EF of 40 to 45%, G1 DD, regional WMA -Seen by cardiology, recommending conservative management - BP improving, GDMT limited by hypotension, dysphagia -Will start Coreg 3.125 mg twice daily, ACE/ARB once tolerated with BP BP around 108/78 today, will hold off on ACE/ ARB to be initiated today.    Acute Kidney injury secondary to rhabdomyolysis, ATN, metabolic acidosis Creatinine is back to baseline,   Bicarb low. Will add sodium bicarbonate.     Hypernatremia.  Improved   Anemia of acute illness Monitor.    Acute transaminitis Improving   Hyperglycemia probably secondary to steroids and tube feeds Continue with sliding scale insulin.  Leukocytosis from steroids.    RN Pressure Injury Documentation:    Malnutrition Type:  Nutrition Problem: Severe Malnutrition Etiology: social / environmental circumstances   Malnutrition Characteristics:  Signs/Symptoms: severe muscle depletion, percent weight loss   Nutrition Interventions:  Interventions: Prostat, Tube feeding, MVI  Estimated body mass index is 25.8 kg/m as calculated from the following:   Height as of this encounter: 5' 2.01" (1.575 m).   Weight as of this encounter: 64 kg.  Code Status: full code.  DVT Prophylaxis:  heparin injection 5,000 Units Start: 12/04/23 0930 SCDs Start: 11/27/23 2310   Level of Care: Level of care: Progressive Family Communication: none at bedside.   Disposition Plan:     Remains inpatient appropriate:  pending clinical improvement.   Procedures:  MRI BRAIN without contrast  Consultants:   Neurology.   Antimicrobials:   Anti-infectives (From admission, onward)    Start     Dose/Rate Route Frequency Ordered Stop   12/14/23 1245  Ampicillin-Sulbactam (UNASYN) 3 g in sodium chloride 0.9 % 100 mL IVPB        3 g 200 mL/hr over 30 Minutes Intravenous Every 6 hours 12/14/23 1154 12/19/23 0640   12/04/23 0930  piperacillin-tazobactam (ZOSYN) IVPB 3.375 g  Status:  Discontinued        3.375 g 12.5 mL/hr over 240 Minutes Intravenous Every 8 hours 12/04/23 0839 12/10/23 1048   11/30/23 1000  cefTRIAXone (ROCEPHIN) 2 g in sodium chloride 0.9 % 100 mL IVPB  Status:  Discontinued        2 g 200 mL/hr over 30 Minutes Intravenous Every 24 hours 11/29/23 1006 12/04/23 0830   11/29/23 1000  vancomycin (VANCOREADY) IVPB 750 mg/150 mL  Status:  Discontinued        750 mg 150  mL/hr over 60 Minutes Intravenous Every 24 hours 11/28/23 0245 11/29/23 1006   11/28/23 2200  acyclovir (ZOVIRAX) 630 mg in dextrose 5 % 100 mL IVPB  Status:  Discontinued        10 mg/kg  63 kg 112.6 mL/hr over 60 Minutes Intravenous Every 24 hours 11/28/23 0247 11/29/23 0836   11/28/23 1000  ampicillin (OMNIPEN) 2 g in sodium chloride 0.9 % 100 mL IVPB  Status:  Discontinued        2 g 300 mL/hr over 20 Minutes Intravenous Every 8 hours 11/28/23 0249 11/29/23 1006   11/28/23 0115  vancomycin (VANCOREADY) IVPB 2000 mg/400 mL        2,000 mg 200 mL/hr over 120 Minutes Intravenous  Once 11/28/23 0028 11/28/23 0308   11/28/23 0115  cefTRIAXone (ROCEPHIN) 2 g in sodium chloride 0.9 % 100 mL IVPB  Status:  Discontinued        2 g 200 mL/hr over 30 Minutes Intravenous Every 12  hours 11/28/23 0028 11/29/23 1006   11/28/23 0115  ampicillin (OMNIPEN) 2 g in sodium chloride 0.9 % 100 mL IVPB        2 g 300 mL/hr over 20 Minutes Intravenous  Once 11/28/23 0028 11/28/23 0129   11/28/23 0100  acyclovir (ZOVIRAX) 700 mg in dextrose 5 % 100 mL IVPB        700 mg 114 mL/hr over 60 Minutes Intravenous  Once 11/28/23 0029 11/28/23 0301        Medications  Scheduled Meds:  acetaminophen (TYLENOL) oral liquid 160 mg/5 mL  650 mg Per Tube Q6H   carvedilol  3.125 mg Per Tube BID WC   Chlorhexidine Gluconate Cloth  6 each Topical Daily   feeding supplement (PROSource TF20)  60 mL Per Tube BID   fiber supplement (BANATROL TF)  60 mL Per Tube BID   folic acid  1 mg Per Tube Daily   free water  200 mL Per Tube Q3H   heparin injection (subcutaneous)  5,000 Units Subcutaneous Q8H   insulin aspart  0-9 Units Subcutaneous Q4H   lamoTRIgine  250 mg Per Tube BID   multivitamin with minerals  1 tablet Per Tube Daily   nystatin cream   Topical BID   mouth rinse  15 mL Mouth Rinse 4 times per day   pantoprazole (PROTONIX) IV  40 mg Intravenous QHS   predniSONE  50 mg Oral Q breakfast   Followed by   Melene Muller  ON 12/25/2023] predniSONE  40 mg Oral Q breakfast   Followed by   Melene Muller ON 01/01/2024] predniSONE  30 mg Oral Q breakfast   Followed by   Melene Muller ON 01/08/2024] predniSONE  20 mg Oral Q breakfast   Followed by   Melene Muller ON 01/15/2024] predniSONE  10 mg Oral Q breakfast   QUEtiapine  50 mg Per Tube QHS   sodium bicarbonate  650 mg Per Tube BID   thiamine  100 mg Per Tube Daily   topiramate  100 mg Per Tube QHS   Continuous Infusions:  feeding supplement (OSMOLITE 1.5 CAL) Stopped (12/20/23 1009)   PRN Meds:.docusate sodium, hydrALAZINE, hydrocortisone cream, ipratropium-albuterol, LORazepam, mouth rinse, polyethylene glycol    Subjective:   Meredith Park was seen and examined today. Appears comfortable, trying to drink water by mouth by SLP.   Objective:   Vitals:   12/20/23 0443 12/20/23 0500 12/20/23 0812 12/20/23 1105  BP: (!) 128/98  (!) 127/95 108/78  Pulse: 96  93 83  Resp: 18  18 18   Temp: 99 F (37.2 C)  97.7 F (36.5 C) 98.1 F (36.7 C)  TempSrc:   Oral Oral  SpO2: 100%  99% 100%  Weight:  64 kg    Height:        Intake/Output Summary (Last 24 hours) at 12/20/2023 1406 Last data filed at 12/20/2023 0439 Gross per 24 hour  Intake 30 ml  Output 1550 ml  Net -1520 ml   Filed Weights   12/18/23 0420 12/19/23 0500 12/20/23 0500  Weight: 62.3 kg 63.1 kg 64 kg     Exam General exam: Appears calm and comfortable  Respiratory system: Clear to auscultation. Respiratory effort normal. Cardiovascular system: S1 & S2 heard, RRR. No JVD,  Gastrointestinal system: Abdomen is nondistended, soft and nontender.  Central nervous system: Alert and trying to talk. Severely hypophonic. Left sided neglect. Extremities: Symmetric 5 x 5 power. Skin: No rashes,  Psychiatry:  No agitation.  Data Reviewed:  I have personally reviewed following labs and imaging studies   CBC Lab Results  Component Value Date   WBC 17.5 (H) 12/20/2023   RBC 3.95 12/20/2023   HGB 11.0  (L) 12/20/2023   HCT 34.3 (L) 12/20/2023   MCV 86.8 12/20/2023   MCH 27.8 12/20/2023   PLT 323 12/20/2023   MCHC 32.1 12/20/2023   RDW 16.0 (H) 12/20/2023   LYMPHSABS 2.8 12/20/2023   MONOABS 0.7 12/20/2023   EOSABS 0.0 12/20/2023   BASOSABS 0.0 12/20/2023     Last metabolic panel Lab Results  Component Value Date   NA 140 12/19/2023   K 3.5 12/19/2023   CL 114 (H) 12/19/2023   CO2 14 (L) 12/19/2023   BUN 29 (H) 12/19/2023   CREATININE 0.68 12/19/2023   GLUCOSE 130 (H) 12/19/2023   GFRNONAA >60 12/19/2023   GFRAA >60 11/10/2018   CALCIUM 8.5 (L) 12/19/2023   PHOS 4.0 12/12/2023   PROT 5.0 (L) 12/19/2023   ALBUMIN 2.5 (L) 12/19/2023   BILITOT 0.4 12/19/2023   ALKPHOS 57 12/19/2023   AST 78 (H) 12/19/2023   ALT 48 (H) 12/19/2023   ANIONGAP 12 12/19/2023    CBG (last 3)  Recent Labs    12/20/23 0444 12/20/23 0813 12/20/23 1106  GLUCAP 106* 121* 141*      Coagulation Profile: No results for input(s): "INR", "PROTIME" in the last 168 hours.   Radiology Studies: DG CHEST PORT 1 VIEW Result Date: 12/19/2023 CLINICAL DATA:  Follow-up basilar consolidation EXAM: PORTABLE CHEST 1 VIEW COMPARISON:  12/14/2023, 12/15/2023 FINDINGS: Gastric catheter is noted in the stomach. The tip is not evaluated on this exam. Lungs are clear. Cardiac shadow is within normal limits. No bony abnormality is noted. IMPRESSION: No acute abnormality noted. Electronically Signed   By: Violeta Grey M.D.   On: 12/19/2023 19:17       Feliciana Horn M.D. [facility] 12/20/2023, 2:06 PM  Available via Epic secure chat 7am-7pm After 7 pm, please refer to night coverage provider listed on amion.

## 2023-12-20 NOTE — TOC Progression Note (Signed)
 Transition of Care [facility]) - Progression Note    Patient Details  Name: Meredith Park MRN: [id number] Date of Birth: 06-19-1971  [facility] CM/SW Contact  Tandy Fam, [place] Phone Number: 12/20/2023, 2:37 PM  Clinical Narrative:   CSW following for disposition. Noting per chart review, continued goals for full scope of care, including peg if needed. Patient still NPO after speech today. CSW to continue to follow, will complete referral for SNF placement once nutrition plan is established.    Expected Discharge Plan: Skilled Nursing Facility Barriers to Discharge: Continued Medical Work up, English as a second language teacher, SNF Pending bed offer  Expected Discharge Plan and Services In-house Referral: Clinical Social Work   Post Acute Care Choice: Skilled Nursing Facility Living arrangements for the past 2 months: Single Family Home                                       Social Determinants of Health (SDOH) Interventions SDOH Screenings   Food Insecurity: Low Risk  (07/18/2023)   Received from [facility]  Housing: Low Risk  (07/18/2023)   Received from [facility]  Transportation Needs: No Transportation Needs (07/18/2023)   Received from [facility]  Utilities: Low Risk  (07/18/2023)   Received from [facility]  Tobacco Use: Low Risk  (12/11/2023)  Recent Concern: Tobacco Use - Medium Risk (11/05/2023)   Received from [facility]    Readmission Risk Interventions    12/12/2023    3:50 PM  Readmission Risk Prevention Plan  Transportation Screening Complete  Medication Review (RN Care Manager) Complete  PCP or Specialist appointment within 3-5 days of discharge Complete  HRI or Home Care Consult Complete  SW Recovery Care/Counseling Consult Complete  Palliative Care Screening Not Applicable  Skilled Nursing Facility Complete

## 2023-12-20 NOTE — Plan of Care (Signed)
 On cortrak feeding, tolerated well, Calm, cooperative and follows command. No significant changes happened as of this time.  Problem: Health Behavior/Discharge Planning: Goal: Ability to manage health-related needs will improve Outcome: Progressing   Problem: Clinical Measurements: Goal: Will remain free from infection Outcome: Progressing   Problem: Activity: Goal: Risk for activity intolerance will decrease Outcome: Progressing   Problem: Coping: Goal: Level of anxiety will decrease Outcome: Progressing   Problem: Nutrition: Goal: Adequate nutrition will be maintained Outcome: Progressing   Problem: Safety: Goal: Ability to remain free from injury will improve Outcome: Progressing   Problem: Skin Integrity: Goal: Risk for impaired skin integrity will decrease Outcome: Progressing   Problem: Safety: Goal: Non-violent Restraint(s) Outcome: Progressing   Problem: Ischemic Stroke/TIA Tissue Perfusion: Goal: Complications of ischemic stroke/TIA will be minimized Outcome: Progressing

## 2023-12-21 DIAGNOSIS — I639 Cerebral infarction, unspecified: Secondary | ICD-10-CM

## 2023-12-21 DIAGNOSIS — I214 Non-ST elevation (NSTEMI) myocardial infarction: Secondary | ICD-10-CM | POA: Diagnosis not present

## 2023-12-21 DIAGNOSIS — I5021 Acute systolic (congestive) heart failure: Secondary | ICD-10-CM

## 2023-12-21 DIAGNOSIS — K7200 Acute and subacute hepatic failure without coma: Secondary | ICD-10-CM | POA: Diagnosis not present

## 2023-12-21 DIAGNOSIS — G40901 Epilepsy, unspecified, not intractable, with status epilepticus: Secondary | ICD-10-CM | POA: Diagnosis not present

## 2023-12-21 DIAGNOSIS — E43 Unspecified severe protein-calorie malnutrition: Secondary | ICD-10-CM | POA: Diagnosis not present

## 2023-12-21 LAB — BASIC METABOLIC PANEL WITH GFR
Anion gap: 9 (ref 5–15)
BUN: 34 mg/dL — ABNORMAL HIGH (ref 6–20)
CO2: 17 mmol/L — ABNORMAL LOW (ref 22–32)
Calcium: 8.7 mg/dL — ABNORMAL LOW (ref 8.9–10.3)
Chloride: 113 mmol/L — ABNORMAL HIGH (ref 98–111)
Creatinine, Ser: 0.78 mg/dL (ref 0.44–1.00)
GFR, Estimated: >60 mL/min (ref >60)
Glucose, Bld: 107 mg/dL — ABNORMAL HIGH (ref 70–99)
Potassium: 3.7 mmol/L (ref 3.5–5.1)
Sodium: 139 mmol/L (ref 135–145)

## 2023-12-21 LAB — GLUCOSE, CAPILLARY
Glucose-Capillary: 115 mg/dL — ABNORMAL HIGH (ref 70–99)
Glucose-Capillary: 115 mg/dL — ABNORMAL HIGH (ref 70–99)
Glucose-Capillary: 137 mg/dL — ABNORMAL HIGH (ref 70–99)
Glucose-Capillary: 143 mg/dL — ABNORMAL HIGH (ref 70–99)
Glucose-Capillary: 150 mg/dL — ABNORMAL HIGH (ref 70–99)
Glucose-Capillary: 97 mg/dL (ref 70–99)

## 2023-12-21 MED ORDER — ATORVASTATIN CALCIUM 40 MG PO TABS
40.0000 mg | ORAL_TABLET | Freq: Every day | ORAL | Status: DC
  Administered 2023-12-21 – 2024-01-02 (×12): 40 mg
  Filled 2023-12-21 (×12): qty 1

## 2023-12-21 MED ORDER — ASPIRIN 81 MG PO CHEW
81.0000 mg | CHEWABLE_TABLET | Freq: Every day | ORAL | Status: DC
  Administered 2023-12-27 – 2024-01-02 (×7): 81 mg
  Filled 2023-12-21 (×8): qty 1

## 2023-12-21 MED ORDER — ASPIRIN 81 MG PO CHEW
81.0000 mg | CHEWABLE_TABLET | Freq: Every day | ORAL | Status: DC
  Administered 2023-12-21: 81 mg
  Filled 2023-12-21: qty 1

## 2023-12-21 NOTE — Plan of Care (Signed)
  Problem: Clinical Measurements: Goal: Cardiovascular complication will be avoided Outcome: Not Progressing   Problem: Nutrition: Goal: Adequate nutrition will be maintained Outcome: Not Progressing   Problem: Elimination: Goal: Will not experience complications related to bowel motility Outcome: Not Progressing   Problem: Elimination: Goal: Will not experience complications related to urinary retention Outcome: Not Progressing   Problem: Safety: Goal: Ability to remain free from injury will improve Outcome: Not Progressing   Problem: Skin Integrity: Goal: Risk for impaired skin integrity will decrease Outcome: Not Progressing   Problem: Tissue Perfusion: Goal: Adequacy of tissue perfusion will improve Outcome: Not Progressing

## 2023-12-21 NOTE — Progress Notes (Signed)
 [facility]                                                                               Meredith Park, is a 53 y.o. female, DOB - [birth year], ZOX:[id number] Admit date - 11/27/2023    Outpatient Primary MD for the patient is Fulbright, [place]  E, PA-C  LOS - 24  days    Brief summary   53 year old female with history of alcohol abuse, HLD, seizures, hypertension, abstinence from alcohol for last 1 year, found down beside her bed by her family members, was noted to be shaking and unresponsive.  EMS was called and was found to be in generalized tonic-clonic seizures, treated with Versed  with cessation of seizures, remained minimally responsive in the ED.  Hypoxemic requiring 10 L O2 and was intubated to protect airway and admitted to the ICU. Significant events as below. 3/25 admit to ICU.  Hypoxemic and unresponsive.  Intubated 3/26 LP performed. Increasing pressor requirement 3/27 Increased vasopressor requirement with worsening MOD including AKI, shock liver. Echo with stress CM. 3/29 Improved vasopressor requirements 4/2 central line d/c, extubated (had cuff leak), reintubated for stridor & secretions several hours later 4/5 CTH w/progression of unihemispheric edema, thought more c/w seizure changes 4/6 LTM, steroids, 4/7 extubated, intermittently on predex for agitation, LTM d/c'd, tmax 101.6/ WBC stable 4/10 on room air.  Transferred to medical floor.  Core track feeding. 4/11 full of secretions unable to clear , requiring non- rebreather, transferred back to ICU 4/14: Transferred out of ICU, TRH   Significant studies: Brain MRI 3/28 with extensive cortical restricted diffusion in the right cerebral hemisphere Brain MRI 4/6 with diffuse cortical edema and increased vascularity to the right hemisphere Spinal tap, no evidence of infection. EEG, postictal.   4/17 patient working with SLP making progress. PEG placement requested. Plan for repeat MRI brain today.   4/18 MRI reviewed. PEG placement by IR today.   Assessment & Plan    Assessment and Plan:  Status epilepticus with history of seizure disorder Acute encephalopathy secondary to status epilepticus Right cerebral hemisphere edema consistent with postictal state She continues to have left-sided weakness Neurology on board suspect this is postsurgical versus potential autoimmune/paraneoplastic/vasculitis. CT of the chest abdomen and pelvis without any signs of malignancy Neurology recommends to continue with Topamax  100 mg daily, lamotrigine  250 mg BID and DC Vimpat  Prednisone  taper by 10 mg every week. Repeat MRI brain without contrast ordered, showed Recent punctate infarcts in the left temporal subcortical white matter and superior right cerebellum. Evolutionary changes throughout the right cerebral and left cerebellar hemispheres as described above most compatible with evolving injury from recent status epilepticus. She is also on aspirin  81 mg daily and lipitor 40 mg daily for secondary stroke prevention.     Acute hypoxic respiratory failure secondary to aspiration versus volume overload -Patient was initially intubated for status epilepticus and airway protection, subsequently reintubated due to aspiration versus volume overload, has been extubated - On 4/11, was transferred back to ICU for increasing hypoxia, placed on high flow O2 via [place], placed on diuresis, received Lasix  last 40 mg IV x 1 on 4/12 - O2 sats 100% on room  air  - Continue PT OT, I-S, SLP - completed course of antibiotics.  - repeat CXR wnl.    Dysphagia/aspiration Patient on core track, failed SLP evaluation Palliative medicine consulted for goals of care/PEG tubes. Continue with full scope treatments with aggressive life prolonging interventions including PEG.  PEG requested from IR.  Possible PEG placement today.    New onset cardiomyopathy, elevated troponin likely due to demand ischemia Hypertension -  2D echo 11/28/2023 showed EF of 40 to 45%, G1 DD, regional WMA -Seen by cardiology, recommending conservative management - BP improving, GDMT limited by hypotension, dysphagia -Will start Coreg  3.125 mg twice daily, ACE/ARB once tolerated with BP    Acute Kidney injury secondary to rhabdomyolysis, ATN, metabolic acidosis Creatinine is back to baseline,  Bicarb low. Will add sodium bicarbonate . Bicarb improved to 17.     Hypernatremia.  Resolved.    Anemia of acute illness Monitor. Hemoglobin around 11.    Acute transaminitis Improving. Recheck in one week.    Hyperglycemia probably secondary to steroids and tube feeds Continue with sliding scale insulin .  Leukocytosis from steroids.  Monitor.    RN Pressure Injury Documentation:    Malnutrition Type:  Nutrition Problem: Severe Malnutrition Etiology: social / environmental circumstances   Malnutrition Characteristics:  Signs/Symptoms: severe muscle depletion, percent weight loss   Nutrition Interventions:  Interventions: Prostat, Tube feeding, MVI  Estimated body mass index is 25.64 kg/m as calculated from the following:   Height as of this encounter: 5' 2.01" (1.575 m).   Weight as of this encounter: 63.6 kg.  Code Status: full code.  DVT Prophylaxis:  heparin  injection 5,000 Units Start: 12/04/23 0930 SCDs Start: 11/27/23 2310   Level of Care: Level of care: Progressive Family Communication: none at bedside.   Disposition Plan:     Remains inpatient appropriate:  pending clinical improvement, possible SNF placement once PEG is placed.   Procedures:  MRI BRAIN without contrast  Consultants:   Neurology.   Antimicrobials:   Anti-infectives (From admission, onward)    Start     Dose/Rate Route Frequency Ordered Stop   12/14/23 1245  Ampicillin -Sulbactam (UNASYN ) 3 g in sodium chloride  0.9 % 100 mL IVPB        3 g 200 mL/hr over 30 Minutes Intravenous Every 6 hours 12/14/23 1154 12/19/23 0640    12/04/23 0930  piperacillin -tazobactam (ZOSYN ) IVPB 3.375 g  Status:  Discontinued        3.375 g 12.5 mL/hr over 240 Minutes Intravenous Every 8 hours 12/04/23 0839 12/10/23 1048   11/30/23 1000  cefTRIAXone  (ROCEPHIN ) 2 g in sodium chloride  0.9 % 100 mL IVPB  Status:  Discontinued        2 g 200 mL/hr over 30 Minutes Intravenous Every 24 hours 11/29/23 1006 12/04/23 0830   11/29/23 1000  vancomycin  (VANCOREADY) IVPB 750 mg/150 mL  Status:  Discontinued        750 mg 150 mL/hr over 60 Minutes Intravenous Every 24 hours 11/28/23 0245 11/29/23 1006   11/28/23 2200  acyclovir  (ZOVIRAX ) 630 mg in dextrose  5 % 100 mL IVPB  Status:  Discontinued        10 mg/kg  63 kg 112.6 mL/hr over 60 Minutes Intravenous Every 24 hours 11/28/23 0247 11/29/23 0836   11/28/23 1000  ampicillin  (OMNIPEN) 2 g in sodium chloride  0.9 % 100 mL IVPB  Status:  Discontinued        2 g 300 mL/hr over 20 Minutes Intravenous Every 8 hours  11/28/23 0249 11/29/23 1006   11/28/23 0115  vancomycin  (VANCOREADY) IVPB 2000 mg/400 mL        2,000 mg 200 mL/hr over 120 Minutes Intravenous  Once 11/28/23 0028 11/28/23 0308   11/28/23 0115  cefTRIAXone  (ROCEPHIN ) 2 g in sodium chloride  0.9 % 100 mL IVPB  Status:  Discontinued        2 g 200 mL/hr over 30 Minutes Intravenous Every 12 hours 11/28/23 0028 11/29/23 1006   11/28/23 0115  ampicillin  (OMNIPEN) 2 g in sodium chloride  0.9 % 100 mL IVPB        2 g 300 mL/hr over 20 Minutes Intravenous  Once 11/28/23 0028 11/28/23 0129   11/28/23 0100  acyclovir  (ZOVIRAX ) 700 mg in dextrose  5 % 100 mL IVPB        700 mg 114 mL/hr over 60 Minutes Intravenous  Once 11/28/23 0029 11/28/23 0301        Medications  Scheduled Meds:  acetaminophen  (TYLENOL ) oral liquid 160 mg/5 mL  650 mg Per Tube Q6H   aspirin   81 mg Per Tube Daily   atorvastatin   40 mg Per Tube Daily   carvedilol   3.125 mg Per Tube BID WC   Chlorhexidine  Gluconate Cloth  6 each Topical Daily   feeding supplement  (PROSource TF20)  60 mL Per Tube BID   fiber supplement (BANATROL TF)  60 mL Per Tube BID   folic acid   1 mg Per Tube Daily   free water   200 mL Per Tube Q3H   heparin  injection (subcutaneous)  5,000 Units Subcutaneous Q8H   insulin  aspart  0-9 Units Subcutaneous Q4H   lamoTRIgine   250 mg Per Tube BID   multivitamin with minerals  1 tablet Per Tube Daily   nystatin  cream   Topical BID   mouth rinse  15 mL Mouth Rinse 4 times per day   pantoprazole  (PROTONIX ) IV  40 mg Intravenous QHS   predniSONE   50 mg Oral Q breakfast   Followed by   Meredith Park ON 12/25/2023] predniSONE   40 mg Oral Q breakfast   Followed by   Meredith Park ON 01/01/2024] predniSONE   30 mg Oral Q breakfast   Followed by   Meredith Park ON 01/08/2024] predniSONE   20 mg Oral Q breakfast   Followed by   Meredith Park ON 01/15/2024] predniSONE   10 mg Oral Q breakfast   QUEtiapine   50 mg Per Tube QHS   sodium bicarbonate   650 mg Per Tube BID   thiamine   100 mg Per Tube Daily   topiramate   100 mg Per Tube QHS   Continuous Infusions:  feeding supplement (OSMOLITE 1.5 CAL) 1,000 mL (12/20/23 1533)   PRN Meds:.docusate sodium , hydrALAZINE , hydrocortisone  cream, ipratropium-albuterol , LORazepam , mouth rinse, polyethylene glycol    Subjective:   Meredith Park was seen and examined today. Arousable, not in distress. Trying to talk.   Objective:   Vitals:   12/21/23 0007 12/21/23 0432 12/21/23 0600 12/21/23 0753  BP: 117/70 105/68  112/80  Pulse: (!) 135 85  92  Resp: 18 18  18   Temp: (!) 97.4 F (36.3 C) (!) 97.5 F (36.4 C)  98.3 F (36.8 C)  TempSrc: Oral Oral  Oral  SpO2: 96% 100%  98%  Weight:   63.6 kg   Height:        Intake/Output Summary (Last 24 hours) at 12/21/2023 1007 Last data filed at 12/21/2023 0916 Gross per 24 hour  Intake 100 ml  Output 2100 ml  Net -2000 ml  Filed Weights   12/19/23 0500 12/20/23 0500 12/21/23 0600  Weight: 63.1 kg 64 kg 63.6 kg     Exam General exam:  young woman s/p cortrak, not in  distress.  Respiratory system: Clear to auscultation. Respiratory effort normal. Cardiovascular system: S1 & S2 heard, RRR. No JVD,  Gastrointestinal system: Abdomen is nondistended, soft and nontender.  Central nervous system: Alert , hypophonic, left sided neglect.  Extremities: no pedal edema.  Skin: No rashes, lesions or ulcers Psychiatry: not agitated.     Data Reviewed:  I have personally reviewed following labs and imaging studies   CBC Lab Results  Component Value Date   WBC 17.5 (H) 12/20/2023   RBC 3.95 12/20/2023   HGB 11.0 (L) 12/20/2023   HCT 34.3 (L) 12/20/2023   MCV 86.8 12/20/2023   MCH 27.8 12/20/2023   PLT 323 12/20/2023   MCHC 32.1 12/20/2023   RDW 16.0 (H) 12/20/2023   LYMPHSABS 2.8 12/20/2023   MONOABS 0.7 12/20/2023   EOSABS 0.0 12/20/2023   BASOSABS 0.0 12/20/2023     Last metabolic panel Lab Results  Component Value Date   NA 139 12/21/2023   K 3.7 12/21/2023   CL 113 (H) 12/21/2023   CO2 17 (L) 12/21/2023   BUN 34 (H) 12/21/2023   CREATININE 0.78 12/21/2023   GLUCOSE 107 (H) 12/21/2023   GFRNONAA >60 12/21/2023   GFRAA >60 11/10/2018   CALCIUM  8.7 (L) 12/21/2023   PHOS 4.0 12/12/2023   PROT 5.0 (L) 12/19/2023   ALBUMIN  2.5 (L) 12/19/2023   BILITOT 0.4 12/19/2023   ALKPHOS 57 12/19/2023   AST 78 (H) 12/19/2023   ALT 48 (H) 12/19/2023   ANIONGAP 9 12/21/2023    CBG (last 3)  Recent Labs    12/20/23 2358 12/21/23 0434 12/21/23 0754  GLUCAP 105* 150* 115*      Coagulation Profile: No results for input(s): "INR", "PROTIME" in the last 168 hours.   Radiology Studies: CT ABDOMEN WO CONTRAST Result Date: 12/20/2023 CLINICAL DATA:  Seizure disorder, encephalopathy and evaluation for possible percutaneous gastrostomy tube placement for long-term nutritional needs. EXAM: CT ABDOMEN WITHOUT CONTRAST TECHNIQUE: Multidetector CT imaging of the abdomen was performed following the standard protocol without IV contrast. RADIATION DOSE  REDUCTION: This exam was performed according to the departmental dose-optimization program which includes automated exposure control, adjustment of the mA and/or kV according to patient size and/or use of iterative reconstruction technique. COMPARISON:  12/09/2023 FINDINGS: Lower chest: Mild bibasilar atelectasis. Small amount of pericardial fluid at the base of the heart. Hepatobiliary: No focal liver abnormality is seen. No gallstones, gallbladder wall thickening, or biliary dilatation. Pancreas: Unremarkable. No pancreatic ductal dilatation or surrounding inflammatory changes. Spleen: Normal in size without focal abnormality. Adrenals/Urinary Tract: No adrenal masses. Nonobstructing calculus in the lower pole of the left kidney measures approximately 4 mm. Stomach/Bowel: No hiatal hernia. Feeding tube enters the stomach and terminates in the second portion of the duodenum. Bowel shows mild ileus involving predominantly the colon. There is a percutaneous window for gastrostomy tube placement with the stomach opposing the abdominal wall between the tip of the left lobe of the liver and the splenic flexure. No free intraperitoneal air identified. Vascular/Lymphatic: Stable atherosclerosis of the abdominal aorta without aneurysm. No enlarged abdominal lymph nodes. Other: No abdominal wall hernia, ascites or abnormal fluid collection. Musculoskeletal: No acute or significant osseous findings. IMPRESSION: 1. Percutaneous window for gastrostomy tube placement present with the stomach opposing the abdominal wall between the tip  of the left lobe of the liver and the splenic flexure of the colon. 2. Mild ileus involving predominantly the colon. 3. Nonobstructing 4 mm calculus in the lower pole of the left kidney. 4. Aortic atherosclerosis. Electronically Signed   By: Erica Hau M.D.   On: 12/20/2023 16:56   MR BRAIN WO CONTRAST Result Date: 12/20/2023 CLINICAL DATA:  Delirium.  Recent status epilepticus. EXAM: MRI  HEAD WITHOUT CONTRAST TECHNIQUE: Multiplanar, multiecho pulse sequences of the brain and surrounding structures were obtained without intravenous contrast. COMPARISON:  Head MRI 12/09/2023 and 12/08/2023 FINDINGS: Brain: Widespread diffusion restriction throughout cortex and much of the white matter of the right cerebral hemisphere on the 12/08/2023 MRI has decreased in intensity. There is persistent involvement of the basal ganglia and dorsal right thalamus. There is persistent corresponding T2 hyperintensity throughout right cerebral hemispheric cortex, and extensive confluent T2 hyperintensity has also now developed throughout the white matter. However, the overall degree of brain swelling has decreased with resolution of mass effect on the right lateral ventricle and resolved midline shift. There is new mild cortical T1 hyperintensity throughout much of the right cerebral hemisphere suggestive of laminar necrosis. Diffusion restriction and T2 hyperintensity in the left cerebellar hemisphere have decreased. Punctate foci of restricted diffusion/recent infarcts are again seen in the left temporal subcortical white matter. A punctate focus of restricted diffusion superiorly in the right cerebellar hemisphere is new (series 10, image 17). No intracranial hemorrhage or extra-axial fluid collection is evident. There is mild cerebral atrophy. A partially empty sella is unchanged. Vascular: Major intracranial vascular flow voids are preserved. Skull and upper cervical spine: Unremarkable bone marrow signal. Sinuses/Orbits: Trace bilateral mastoid fluid. Clear paranasal sinuses. Unremarkable orbits. Other: None. IMPRESSION: 1. Evolutionary changes throughout the right cerebral and left cerebellar hemispheres as described above most compatible with evolving injury from recent status epilepticus. T2 hyperintensity throughout the right cerebral hemispheric white matter and basal ganglia has increased, however brain swelling  has decreased with resolved mass effect. New cortical laminar necrosis. 2. 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 Electronically Signed   By: Aundra Lee M.D.   On: 12/20/2023 15:37   DG CHEST PORT 1 VIEW Result Date: 12/19/2023 CLINICAL DATA:  Follow-up basilar consolidation EXAM: PORTABLE CHEST 1 VIEW COMPARISON:  12/14/2023, 12/15/2023 FINDINGS: Gastric catheter is noted in the stomach. The tip is not evaluated on this exam. Lungs are clear. Cardiac shadow is within normal limits. No bony abnormality is noted. IMPRESSION: No acute abnormality noted. Electronically Signed   By: Violeta Grey M.D.   On: 12/19/2023 19:17       Feliciana Horn M.D. [facility] 12/21/2023, 10:07 AM  Available via Epic secure chat 7am-7pm After 7 pm, please refer to night coverage provider listed on amion.

## 2023-12-21 NOTE — Progress Notes (Signed)
 Occupational Therapy Treatment Patient Details Name: Meredith Park MRN: [id number] DOB: 1971-02-21 Today's Date: 12/21/2023   History of present illness 53 yo female admitted 11/27/23 s/p seizure with acute encephalopathy, acute respiratory failure, septic shock secondary to aspiration PNA. LP 3/26, ETT 3/26-4/2, re-intubated 4/2. Extubated 4/7. PMH: HTN, HLD ETOH abuse hx, seizure   OT comments  Pt eager to participate in therapy session today, worked with pt on increased visual attention to L side. Pt doing better with focusing on desired objects placed on her L side, does not shift gaze or turn head L when prompted. Pt able to transfer to recliner today with Max A +2, LLE providing a bit of support for pt to reposition it until onset of fatigue. OT to continue to progress pt as able, DC plans remain appropriate for CIR.       If plan is discharge home, recommend the following:  Two people to help with walking and/or transfers;Assistance with cooking/housework;Assist for transportation;Supervision due to cognitive status;Direct supervision/assist for medications management;Two people to help with bathing/dressing/bathroom;Direct supervision/assist for financial management   Equipment Recommendations  Wheelchair (measurements OT);Wheelchair cushion (measurements OT);Hospital bed;Hoyer lift;BSC/3in1    Recommendations for Other Services      Precautions / Restrictions Precautions Precautions: Fall;Other (comment) Recall of Precautions/Restrictions: Impaired Precaution/Restrictions Comments: rectal pouch,cortrak, R wrist restraint Restrictions Weight Bearing Restrictions Per Provider Order: No       Mobility Bed Mobility Overal bed mobility: Needs Assistance Bed Mobility: Supine to Sit     Supine to sit: Max assist, HOB elevated     General bed mobility comments: Max A to maneuver BLEs and cues for pt to assist by bring RLE along, Max A to raise trunk and to scoot hips  towards EOB using pads.    Transfers Overall transfer level: Needs assistance Equipment used: 2 person hand held assist, Hemi-walker Transfers: Sit to/from Stand, Bed to chair/wheelchair/BSC Sit to Stand: +2 physical assistance, Mod assist     Step pivot transfers: +2 physical assistance, Max assist, +2 safety/equipment     General transfer comment: Mod A STS from EOB , Max A +2 to faciliate stepping and for weight shifts. LLE initially repositioning without physical assist until onset of fatigue then needing ext Max A to reposition LLE. Difficulty sequencing transfers and sitting motion     Balance Overall balance assessment: Needs assistance Sitting-balance support: Feet supported Sitting balance-Leahy Scale: Poor Sitting balance - Comments: heavy L lean requiring Max A with intermittent Min A Postural control: Left lateral lean Standing balance support: Bilateral upper extremity supported Standing balance-Leahy Scale: Poor Standing balance comment: requires external support to remain standing.                           ADL either performed or assessed with clinical judgement   ADL   Eating/Feeding: Sitting;Moderate assistance Eating/Feeding Details (indicate cue type and reason): Cotx with SLP and PT. Placing water  and apple sauce on Pt L side, holding apple sauce in L hand and using RUE to scoop. Gradually moving objects further L to increase attention                                        Extremity/Trunk Assessment Upper Extremity Assessment LUE Deficits / Details: flaccid LUE            Vision  Perception Perception Perception: Impaired Preception Impairment Details: Inattention/Neglect;Spatial orientation Perception-Other Comments: Pushes L, L neglect and R gaze preference. Significant visual cues to attend to midline   Praxis     Communication Communication Communication: Impaired Factors Affecting Communication:  Difficulty expressing self;Reduced clarity of speech   Cognition Arousal: Alert Behavior During Therapy: Flat affect Cognition: Cognition impaired Difficult to assess due to: Impaired communication   Awareness: Intellectual awareness impaired, Online awareness impaired   Attention impairment (select first level of impairment): Selective attention   OT - Cognition Comments: Pt responding to yes/no questions with thumbs up, attempting to vocalize and following simple 1 step commands                 Following commands: Impaired Following commands impaired: Follows one step commands with increased time      Cueing   Cueing Techniques: Verbal cues, Tactile cues, Visual cues  Exercises      Shoulder Instructions       General Comments No signs/symptoms of cardiac/respiratory distress during session.    Pertinent Vitals/ Pain       Pain Assessment Pain Assessment: Faces Faces Pain Scale: Hurts little more Pain Location: with mobility; unsure of what hurts Pain Descriptors / Indicators: Grimacing Pain Intervention(s): Monitored during session  Home Living                                          Prior Functioning/Environment              Frequency  Min 2X/week        Progress Toward Goals  OT Goals(current goals can now be found in the care plan section)  Progress towards OT goals: Progressing toward goals  Acute Rehab OT Goals Patient Stated Goal: listen to music OT Goal Formulation: With patient Time For Goal Achievement: 01/01/24 Potential to Achieve Goals: Fair  Plan      Co-evaluation    PT/OT/SLP Co-Evaluation/Treatment: Yes Reason for Co-Treatment: Complexity of the patient's impairments (multi-system involvement);For patient/therapist safety;To address functional/ADL transfers PT goals addressed during session: Mobility/safety with mobility;Balance OT goals addressed during session: ADL's and  self-care;Strengthening/ROM SLP goals addressed during session: Swallowing;Cognition;Communication    AM-PAC OT 6 Clicks Daily Activity     Outcome Measure   Help from another person eating meals?: Total (cortrac) Help from another person taking care of personal grooming?: A Lot Help from another person toileting, which includes using toliet, bedpan, or urinal?: Total Help from another person bathing (including washing, rinsing, drying)?: A Lot Help from another person to put on and taking off regular upper body clothing?: A Lot Help from another person to put on and taking off regular lower body clothing?: Total 6 Click Score: 9    End of Session Equipment Utilized During Treatment: Gait belt;Oxygen;Other (comment) (hemi walker)  OT Visit Diagnosis: Unsteadiness on feet (R26.81);Muscle weakness (generalized) (M62.81);Other symptoms and signs involving cognitive function;Other symptoms and signs involving the nervous system (R29.898);Other abnormalities of gait and mobility (R26.89);Hemiplegia and hemiparesis Hemiplegia - Right/Left: Left Hemiplegia - dominant/non-dominant: Non-Dominant Hemiplegia - caused by: Unspecified   Activity Tolerance Patient tolerated treatment well   Patient Left in chair;with call bell/phone within reach;with chair alarm set   Nurse Communication Mobility status;Other (comment) (L resting hand splint off at 12:28, please place back on ~4:30)        Time: 8847-8771 OT Time Calculation (  min): 36 min  Charges: OT General Charges $OT Visit: 1 Visit OT Treatments $Therapeutic Activity: 8-22 mins  12/21/2023  AB, OTR/L  Acute Rehabilitation Services  Office: [phone]   Curtistine JONETTA Das 12/21/2023, 1:54 PM

## 2023-12-21 NOTE — Consult Note (Signed)
 Chief Complaint: Dysphagia; request for image guided gastrostomy tube placement  Referring Provider(s): Dr. Feliciana Horn  Supervising Physician: Marland Silvas  Patient Status: [facility] - In-pt  History of Present Illness: Summerlyn Fickel is a 53 y.o. female with history of alcohol abuse (abstinent past year), HLD, seizures, HTN. On 11/27/23 she was found down beside her bed by her family members, noted to be shaking and unresponsive. EMS was called and was found to be in generalized tonic-clonic seizures, treated with Versed  with cessation of seizures, remained minimally responsive in the ED. Hypoxemic requiring 10 L O2 and was intubated to protect airway and admitted to the ICU. Her care required intubation, pressors, CVC. Neurology consulted for cortical edema. LP performed without evidence of infection. Pt has since been extubated, CVC discontinued, and no longer requires pressors. Tolerating RA. IR is consulted for gastrostomy tube placement so that she can continue moving forward towards SNF placement. Currently receiving tube feedings via NG.    Patient is Full Code  Past Medical History:  Diagnosis Date   Alcohol abuse    Alcohol withdrawal (HCC) 08/28/2018   High cholesterol    Hypertension    Seizures (HCC)     History reviewed. No pertinent surgical history.  Allergies: Meperidine, Hydrochlorothiazide, and Keppra  [levetiracetam ]  Medications: Prior to Admission medications   Medication Sig Start Date End Date Taking? Authorizing Provider  atorvastatin  (LIPITOR) 10 MG tablet Take 10 mg by mouth daily. 07/20/22  Yes [provider]  carvedilol  (COREG ) 12.5 MG tablet Take 1 tablet (12.5 mg total) by mouth 2 (two) times daily. 08/24/17  Yes Debbra Fairy, PA-C  clonazePAM  (KLONOPIN ) 0.5 MG tablet Take 1 tablet (0.5 mg total) by mouth every 8 (eight) hours. 11/09/22 11/27/23 Yes Lesa Rape, MD  ibuprofen  (ADVIL ) 200 MG tablet Take 600 mg by mouth every 6 (six) hours  as needed for moderate pain.   Yes [provider]  lamoTRIgine  (LAMICTAL ) 200 MG tablet Take 200 mg by mouth 2 (two) times daily. Take medication with two (25mg ) tablet by mouth twice daily for a total of 250mg . 10/14/22  Yes [provider]  lamoTRIgine  (LAMICTAL ) 25 MG tablet Take 50 mg by mouth 2 (two) times daily. Take with one (200mg ) tablet by mouth twice daily for a dose total of 250mg . 09/15/22  Yes [provider]  losartan  (COZAAR ) 50 MG tablet Take 50 mg by mouth daily. Take only if blood pressure is over 139. 07/12/21  Yes [provider]  Midazolam  (NAYZILAM ) 5 MG/0.1ML SOLN PLACE 5 MG INTO THE NOSE AS NEEDED. 11/30/22  Yes Camara, Amadou, MD  SUMAtriptan  (IMITREX ) 100 MG tablet Take 1 tablet (100 mg total) by mouth daily as needed for migraine. 12/11/21  Yes Scarlette Currier, MD  topiramate  (TOPAMAX ) 100 MG tablet Take 100 mg by mouth daily. 05/31/23  Yes [provider]  sertraline  (ZOLOFT ) 50 MG tablet Take 50 mg by mouth daily. Patient not taking: Reported on 11/07/2022 10/16/22   [provider]     Family History  Problem Relation Age of Onset   Sleep apnea Neg Hx     Social History   Socioeconomic History   Marital status: Divorced    Spouse name: Not on file   Number of children: Not on file   Years of education: Not on file   Highest education level: Not on file  Occupational History   Not on file  Tobacco Use   Smoking status: Never  Smokeless tobacco: Never  Vaping Use   Vaping status: Some Days  Substance and Sexual Activity   Alcohol use: Yes    Comment: daily, 1/2 gallon a day x 3 days after relapse, 4 years sober prior to that   Drug use: Not Currently    Types: Marijuana   Sexual activity: Not on file  Other Topics Concern   Not on file  Social History Narrative   Not on file   Social Drivers of Health   Financial Resource Strain: Not on file  Food Insecurity: Low Risk  (07/18/2023)   Received  from [facility]   Hunger Vital Sign    Worried About Running Out of Food in the Last Year: Never true    Ran Out of Food in the Last Year: Never true  Transportation Needs: No Transportation Needs (07/18/2023)   Received from [facility]    In the past 12 months, has lack of reliable transportation kept you from medical appointments, meetings, work or from getting things needed for daily living? : No  Physical Activity: Not on file  Stress: Not on file  Social Connections: Not on file     Review of Systems: limited by AMS  Review of Systems  Constitutional:  Negative for fever.  Gastrointestinal:  Negative for abdominal pain.  Psychiatric/Behavioral:  Positive for agitation.     Vital Signs: BP 112/80 (BP Location: Left Arm)   Pulse 92   Temp 98.3 F (36.8 C) (Oral)   Resp 18   Ht 5' 2.01" (1.575 m)   Wt 140 lb 3.4 oz (63.6 kg)   LMP 08/28/2020   SpO2 98%   BMI 25.64 kg/m     Physical Exam HENT:     Nose:     Comments: NG tube present    Mouth/Throat:     Mouth: Mucous membranes are dry.     Pharynx: Oropharynx is clear.  Cardiovascular:     Rate and Rhythm: Normal rate and regular rhythm.     Pulses: Normal pulses.     Heart sounds: Normal heart sounds.  Pulmonary:     Effort: Pulmonary effort is normal.     Breath sounds: Normal breath sounds.  Abdominal:     Palpations: Abdomen is soft.     Tenderness: There is no abdominal tenderness.  Genitourinary:    Comments: Foley present; fecal pouch present Musculoskeletal:     Right lower leg: No edema.     Left lower leg: No edema.  Skin:    General: Skin is warm and dry.     Comments: No rash or wound to upper abd  Neurological:     Mental Status: She is alert.     Motor: Weakness present.     Comments: L sided deficits  Psychiatric:     Comments: Wearing soft restraint R wrist, poor judgement; calm at time of exam     Imaging: CT ABDOMEN WO CONTRAST Result Date:  12/20/2023 CLINICAL DATA:  Seizure disorder, encephalopathy and evaluation for possible percutaneous gastrostomy tube placement for long-term nutritional needs. EXAM: CT ABDOMEN WITHOUT CONTRAST TECHNIQUE: Multidetector CT imaging of the abdomen was performed following the standard protocol without IV contrast. RADIATION DOSE REDUCTION: This exam was performed according to the departmental dose-optimization program which includes automated exposure control, adjustment of the mA and/or kV according to patient size and/or use of iterative reconstruction technique. COMPARISON:  12/09/2023 FINDINGS: Lower chest: Mild bibasilar atelectasis. Small amount of pericardial  fluid at the base of the heart. Hepatobiliary: No focal liver abnormality is seen. No gallstones, gallbladder wall thickening, or biliary dilatation. Pancreas: Unremarkable. No pancreatic ductal dilatation or surrounding inflammatory changes. Spleen: Normal in size without focal abnormality. Adrenals/Urinary Tract: No adrenal masses. Nonobstructing calculus in the lower pole of the left kidney measures approximately 4 mm. Stomach/Bowel: No hiatal hernia. Feeding tube enters the stomach and terminates in the second portion of the duodenum. Bowel shows mild ileus involving predominantly the colon. There is a percutaneous window for gastrostomy tube placement with the stomach opposing the abdominal wall between the tip of the left lobe of the liver and the splenic flexure. No free intraperitoneal air identified. Vascular/Lymphatic: Stable atherosclerosis of the abdominal aorta without aneurysm. No enlarged abdominal lymph nodes. Other: No abdominal wall hernia, ascites or abnormal fluid collection. Musculoskeletal: No acute or significant osseous findings. IMPRESSION: 1. Percutaneous window for gastrostomy tube placement present with the stomach opposing the abdominal wall between the tip of the left lobe of the liver and the splenic flexure of the colon. 2.  Mild ileus involving predominantly the colon. 3. Nonobstructing 4 mm calculus in the lower pole of the left kidney. 4. Aortic atherosclerosis. Electronically Signed   By: Erica Hau M.D.   On: 12/20/2023 16:56   MR BRAIN WO CONTRAST Result Date: 12/20/2023 CLINICAL DATA:  Delirium.  Recent status epilepticus. EXAM: MRI HEAD WITHOUT CONTRAST TECHNIQUE: Multiplanar, multiecho pulse sequences of the brain and surrounding structures were obtained without intravenous contrast. COMPARISON:  Head MRI 12/09/2023 and 12/08/2023 FINDINGS: Brain: Widespread diffusion restriction throughout cortex and much of the white matter of the right cerebral hemisphere on the 12/08/2023 MRI has decreased in intensity. There is persistent involvement of the basal ganglia and dorsal right thalamus. There is persistent corresponding T2 hyperintensity throughout right cerebral hemispheric cortex, and extensive confluent T2 hyperintensity has also now developed throughout the white matter. However, the overall degree of brain swelling has decreased with resolution of mass effect on the right lateral ventricle and resolved midline shift. There is new mild cortical T1 hyperintensity throughout much of the right cerebral hemisphere suggestive of laminar necrosis. Diffusion restriction and T2 hyperintensity in the left cerebellar hemisphere have decreased. Punctate foci of restricted diffusion/recent infarcts are again seen in the left temporal subcortical white matter. A punctate focus of restricted diffusion superiorly in the right cerebellar hemisphere is new (series 10, image 17). No intracranial hemorrhage or extra-axial fluid collection is evident. There is mild cerebral atrophy. A partially empty sella is unchanged. Vascular: Major intracranial vascular flow voids are preserved. Skull and upper cervical spine: Unremarkable bone marrow signal. Sinuses/Orbits: Trace bilateral mastoid fluid. Clear paranasal sinuses. Unremarkable  orbits. Other: None. IMPRESSION: 1. Evolutionary changes throughout the right cerebral and left cerebellar hemispheres as described above most compatible with evolving injury from recent status epilepticus. T2 hyperintensity throughout the right cerebral hemispheric white matter and basal ganglia has increased, however brain swelling has decreased with resolved mass effect. New cortical laminar necrosis. 2. 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 Electronically Signed   By: Aundra Lee M.D.   On: 12/20/2023 15:37   DG CHEST PORT 1 VIEW Result Date: 12/19/2023 CLINICAL DATA:  Follow-up basilar consolidation EXAM: PORTABLE CHEST 1 VIEW COMPARISON:  12/14/2023, 12/15/2023 FINDINGS: Gastric catheter is noted in the stomach. The tip is not evaluated on this exam. Lungs are clear. Cardiac shadow is within normal limits.  No bony abnormality is noted. IMPRESSION: No acute abnormality noted. Electronically Signed   By: Violeta Grey M.D.   On: 12/19/2023 19:17   DG Abd Portable 1V Result Date: 12/16/2023 CLINICAL DATA:  [id number] Encounter for orogastric (OG) tube placement [id number] EXAM: PORTABLE ABDOMEN - 1 VIEW COMPARISON:  None Available. FINDINGS: Esophagogastric tube is well positioned terminating in the stomach. Nonobstructive bowel gas pattern. No pneumoperitoneum. No organomegaly or radiopaque calculi. No acute fracture or destructive lesion. The lung bases are clear. IMPRESSION: Well-positioned esophagogastric tube terminating in the stomach. Electronically Signed   By: Rance Burrows M.D.   On: 12/16/2023 10:02   CT Angio Chest Pulmonary Embolism (PE) W or WO Contrast Result Date: 12/15/2023 CLINICAL DATA:  Suspected high prob pulmonary embolism EXAM: CT ANGIOGRAPHY CHEST WITH CONTRAST TECHNIQUE: Multidetector CT imaging of the chest was performed using the standard protocol during bolus administration of intravenous  contrast. Multiplanar CT image reconstructions and MIPs were obtained to evaluate the vascular anatomy. RADIATION DOSE REDUCTION: This exam was performed according to the departmental dose-optimization program which includes automated exposure control, adjustment of the mA and/or kV according to patient size and/or use of iterative reconstruction technique. CONTRAST:  75mL OMNIPAQUE  IOHEXOL  350 MG/ML SOLN COMPARISON:  12/09/2023 FINDINGS: Cardiovascular: Heart size normal. Small-moderate pericardial effusion as before. The RV is nondilated. Satisfactory opacification of pulmonary arteries noted, and there is no evidence of pulmonary emboli. Adequate contrast opacification of the thoracic aorta with no evidence of dissection, aneurysm, or stenosis. There is classic 3-vessel brachiocephalic arch anatomy without proximal stenosis. Minimal calcified plaque in the descending thoracic aorta. Mediastinum/Nodes: No mass, hematoma, or adenopathy. Lungs/Pleura: No pleural effusion. No pneumothorax. Dependent consolidation in the lower lobes left greater than right, increased from previous. Upper Abdomen: Feeding tube extends at least as far as the stomach, tip not seen. Mild splenomegaly. No acute findings. Musculoskeletal: No chest wall abnormality. No acute or significant osseous findings. Review of the MIP images confirms the above findings. IMPRESSION: 1. Negative for acute PE or thoracic aortic dissection. 2. Increasing dependent consolidation in the lower lobes left greater than right. 3. Small-moderate pericardial effusion. Electronically Signed   By: Nicoletta Barrier M.D.   On: 12/15/2023 10:52   Overnight EEG with video Result Date: 12/15/2023 Arleene Lack, MD     12/16/2023  6:30 AM Patient Name: Nissi Doffing MRN: [id number] Epilepsy Attending: Arleene Lack Referring Physician/Provider: Roxy Cordial MD Duration: 12/14/2023 1316 to 12/15/2023 1316  Patient history: 54 y.o. female with hx of alcohol and  substance use, Htn, seizures on lamotrigine  and topamax  who was found seizing for unknown duration. EEG to evaluate for seizure  Level of alertness:  lethargic , asleep  AEDs during EEG study: LTG, TPM, LCM  Technical aspects: This EEG study was done with scalp electrodes positioned according to the 10-20 International system of electrode placement. Electrical activity was reviewed with band pass filter of 1-70Hz , sensitivity of 7 uV/mm, display speed of 104mm/sec with a 60Hz  notched filter applied as appropriate. EEG data were recorded continuously and digitally stored.  Video monitoring was available and reviewed as appropriate.  Description: EEG showed continuous low amplitude 2-3Hz  delta slowing in right fronto-temporal region.  EEG also showed 3-7hz  theta-delta slowing in left hemisphere. Sharp waves were noted in left frontotemporal region. Sleep was characterized by vertex waves, sleep spindles (12 to 14 Hz), maximal frontocentral region. Hyperventilation and photic stimulation were not performed.   Event button was pressed on 12/14/2023 at  1450 for right gaze deviation and waving right arm. Concomitant EEG before, during and after the event didn't show any EEG change to suggest seizure.  ABNORMALITY -Sharp wave, left frontotemporal region - Continuous slow, generalized and maximal right fronto-temporal region  IMPRESSION: This study showed evidence of epileptogenicity arising from left frontotemporal region. Additionally there is cortical dysfunction arising from right fronto-temporal region likely secondary to underlying structural abnormality. Lastly there is moderate diffuse encephalopathy. No seizures were noted. Event button was pressed on 12/14/2023 at 1450 for right gaze deviation and waving right arm without concomitant EEG change. This was not an epileptic event.  Arleene Lack   DG CHEST PORT 1 VIEW Result Date: 12/14/2023 CLINICAL DATA:  Shortness of breath.  Seizures. EXAM: PORTABLE CHEST 1  VIEW COMPARISON:  CT 12/09/2023.  X-ray 12/05/2023. FINDINGS: Enteric tube in place with tip extending beneath the diaphragm. No pneumothorax, effusion or edema. Normal cardiopericardial silhouette. Kyphotic x-ray with rotation limits evaluation the right lung apex. No consolidation. Overlapping cardiac leads. IMPRESSION: Enteric tube.  No acute cardiopulmonary disease. Electronically Signed   By: Adrianna Horde M.D.   On: 12/14/2023 10:54   VAS US  LOWER EXTREMITY VENOUS (DVT) Result Date: 12/11/2023  Lower Venous DVT Study Patient Name:  KAJAH SANTIZO  Date of Exam:   12/11/2023 Medical Rec #: [id number]            Accession #:    [id number] Date of Birth: 10-06-1970             Patient Gender: F Patient Age:   55 years Exam Location:  [facility] Procedure:      VAS US  LOWER EXTREMITY VENOUS (DVT) Referring Phys: Felton Hough --------------------------------------------------------------------------------  Indications: Edema.  Risk Factors: None identified. Comparison Study: No significant changes seen since previous exam 11/28/23. Performing Technologist: Estanislao Heimlich  Examination Guidelines: A complete evaluation includes B-mode imaging, spectral Doppler, color Doppler, and power Doppler as needed of all accessible portions of each vessel. Bilateral testing is considered an integral part of a complete examination. Limited examinations for reoccurring indications may be performed as noted. The reflux portion of the exam is performed with the patient in reverse Trendelenburg.  +---------+---------------+---------+-----------+----------+--------------+ RIGHT    CompressibilityPhasicitySpontaneityPropertiesThrombus Aging +---------+---------------+---------+-----------+----------+--------------+ CFV      Full           Yes      Yes                                 +---------+---------------+---------+-----------+----------+--------------+ SFJ      Full                                                         +---------+---------------+---------+-----------+----------+--------------+ FV Prox  Full                                                        +---------+---------------+---------+-----------+----------+--------------+ FV Mid   Full                                                        +---------+---------------+---------+-----------+----------+--------------+  FV DistalFull                                                        +---------+---------------+---------+-----------+----------+--------------+ PFV      Full                    Yes                                 +---------+---------------+---------+-----------+----------+--------------+ POP      Full           Yes      Yes                                 +---------+---------------+---------+-----------+----------+--------------+ PTV      Full                    Yes                                 +---------+---------------+---------+-----------+----------+--------------+ PERO     Full                    Yes                                 +---------+---------------+---------+-----------+----------+--------------+   +---------+---------------+---------+-----------+----------+--------------+ LEFT     CompressibilityPhasicitySpontaneityPropertiesThrombus Aging +---------+---------------+---------+-----------+----------+--------------+ CFV      Full           Yes      Yes                                 +---------+---------------+---------+-----------+----------+--------------+ SFJ      Full                                                        +---------+---------------+---------+-----------+----------+--------------+ FV Prox  Full                                                        +---------+---------------+---------+-----------+----------+--------------+ FV Mid   Full                                                         +---------+---------------+---------+-----------+----------+--------------+ FV DistalFull                                                        +---------+---------------+---------+-----------+----------+--------------+  PFV      Full                                                        +---------+---------------+---------+-----------+----------+--------------+ POP      Full           Yes      Yes                                 +---------+---------------+---------+-----------+----------+--------------+ PTV      Full                    Yes                                 +---------+---------------+---------+-----------+----------+--------------+ PERO     Full                    Yes                                 +---------+---------------+---------+-----------+----------+--------------+     Summary: BILATERAL: - No evidence of deep vein thrombosis seen in the lower extremities, bilaterally. -No evidence of popliteal cyst, bilaterally.   *See table(s) above for measurements and observations. Electronically signed by Viney Kell MD on 12/11/2023 at 7:48:16 PM.    Final    Overnight EEG with video Result Date: 12/10/2023 Arleene Lack, MD     12/10/2023 11:23 AM Patient Name: Shaylan Tutton MRN: [id number] Epilepsy Attending: Arleene Lack Referring Physician/Provider: Khaliqdina, Salman, MD Duration: 12/09/2023 1132 to 12/10/2023 1022  Patient history: 53 y.o. female with hx of alcohol and substance use, Htn, seizures on lamotrigine  and topamax  who was found seizing for unknown duration. EEG to evaluate for seizure  Level of alertness:  lethargic , asleep  AEDs during EEG study: LTG, TPM, LCM  Technical aspects: This EEG study was done with scalp electrodes positioned according to the 10-20 International system of electrode placement. Electrical activity was reviewed with band pass filter of 1-70Hz , sensitivity of 7 uV/mm, display speed of 71mm/sec with a 60Hz  notched filter  applied as appropriate. EEG data were recorded continuously and digitally stored.  Video monitoring was available and reviewed as appropriate.  Description: EEG showed continuous low amplitude 2-3Hz  Hz theta-delta slowing in right fronto-temporal region. EEG also showed 3-7hz  theta-delta slowing in left hemisphere. Sharp waves were noted in left frontotemporal region. Sleep was characterized by vertex waves, sleep spindles (12 to 14 Hz), maximal frontocentral region. Hyperventilation and photic stimulation were not performed.   EEG was disconnected between 12/09/2023 1306 to 1434 for testing.  ABNORMALITY -Sharp wave, left frontotemporal region - Continuous slow, generalized and maximal right fronto-temporal region  IMPRESSION: This study showed evidence of epileptogenicity arising from left frontotemporal region. Additionally there is cortical dysfunction arising from right fronto-temporal region likely secondary to underlying structural abnormality. Lastly there is moderate diffuse encephalopathy. No seizures were noted.  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 EXAM: MRI HEAD WITH CONTRAST TECHNIQUE: Multiplanar, multiecho pulse sequences of the brain and surrounding structures  were obtained with intravenous contrast. CONTRAST:  6.3mL GADAVIST  GADOBUTROL  1 MMOL/ML IV SOLN COMPARISON:  MR head without contrast 12/08/2023. CT head without contrast, CT angio head and neck and CT venogram 12/08/2023 FINDINGS: Diffuse cortical edema of the right hemisphere is again seen. Slight increased vascularity to the right hemisphere is noted, so-called intravascular enhancement. No parenchymal enhancement is present. Partial effacement of the sulci over the right convexity is again noted. Left hemisphere is unremarkable. No other pathologic enhancement is present. The brainstem and cerebellum are within normal limits. IMPRESSION: 1. Diffuse cortical edema  and slight increased vascularity to the right hemisphere, so-called intravascular enhancement. No parenchymal enhancement is present. This is nonspecific and could be seen in a postictal state. Similar findings would be present in the setting of encephalitis. No focal enhancement is present to suggest a discrete infection. 2. No other pathologic enhancement. Electronically Signed   By: Audree Leas M.D.   On: 12/09/2023 14:27   CT CHEST ABDOMEN PELVIS W CONTRAST Result Date: 12/09/2023 CLINICAL DATA:  evaluate for occult malignancy given concern for autoimmune encephalopathy. Status epilepticus EXAM: CT CHEST, ABDOMEN, AND PELVIS WITH CONTRAST TECHNIQUE: Multidetector CT imaging of the chest, abdomen and pelvis was performed following the standard protocol during bolus administration of intravenous contrast. RADIATION DOSE REDUCTION: This exam was performed according to the departmental dose-optimization program which includes automated exposure control, adjustment of the mA and/or kV according to patient size and/or use of iterative reconstruction technique. CONTRAST:  75mL OMNIPAQUE  IOHEXOL  350 MG/ML SOLN COMPARISON:  None Available. FINDINGS: CT CHEST FINDINGS Cardiovascular: Heart is normal size. Aorta is normal caliber. Small pericardial effusion. Mediastinum/Nodes: No mediastinal, hilar, or axillary adenopathy. Trachea and esophagus are unremarkable. Thyroid unremarkable. Endotracheal tube tip in the lower trachea. Lungs/Pleura: No confluent opacities or effusions. No suspicious pulmonary nodules. Musculoskeletal: Chest wall soft tissues are unremarkable. No acute bony abnormality. CT ABDOMEN PELVIS FINDINGS Hepatobiliary: No focal hepatic abnormality. Gallbladder unremarkable. Pancreas: No focal abnormality or ductal dilatation. Spleen: No focal abnormality.  Normal size. Adrenals/Urinary Tract: Small right renal cysts which require no additional follow-up imaging. No suspicious renal or adrenal  abnormality. 2 mm nonobstructing stone in the lower pole of the left kidney. No hydronephrosis. Foley catheter in the bladder which is grossly unremarkable. Stomach/Bowel: Stomach, large and small bowel grossly unremarkable. Feeding tube tip in the duodenal bulb. Vascular/Lymphatic: Aortic atherosclerosis. No evidence of aneurysm or adenopathy. Reproductive: Small rim enhancing areas noted in the lower uterine segment, likely small fibroids. No adnexal mass. Other: No free fluid or free air. Musculoskeletal: No acute bony abnormality. Degenerative disc and facet disease in the lower lumbar spine at L4-5 with bilateral L4 pars defects and grade 1 anterolisthesis. IMPRESSION: No acute findings or evidence of occult malignancy in the chest, abdomen or pelvis. Small pericardial effusion. Abdominal aortic atherosclerosis. Feeding tube tip in the duodenal bulb. Electronically Signed   By: Janeece Mechanic M.D.   On: 12/09/2023 12:34   US  EKG SITE RITE Result Date: 12/09/2023 If Site Rite image not attached, placement could not be confirmed due to current cardiac rhythm.  MR BRAIN WO CONTRAST Result Date: 12/08/2023 CLINICAL DATA:  Altered mental status.  Seizure. EXAM: MRI HEAD WITHOUT CONTRAST TECHNIQUE: Multiplanar, multiecho pulse sequences of the brain and surrounding structures were obtained without intravenous contrast. COMPARISON:  11/30/2023 FINDINGS: Brain: Progression of holo hemispheric edema of the right cerebral hemisphere, which now includes most of the white matter. Decreased diffusion-weighted intensity within the left cerebellar hemisphere. Unchanged  minimal leftward midline shift anteriorly. No hydrocephalus. There is no acute hemorrhage. The midline structures are normal. Vascular: Normal flow voids. Skull and upper cervical spine: Normal marrow signal. Sinuses/Orbits: Small amount of mastoid fluid. Normal orbits. Paranasal sinuses are clear. Other: None IMPRESSION: 1. Progression of holohemispheric  edema of the right cerebral hemisphere, which now includes most of the white matter. The pattern remains consistent with ictal phenomenon. 2. Decreased diffusion-weighted imaging intensity within the left cerebellar hemisphere. Electronically Signed   By: Juanetta Nordmann M.D.   On: 12/08/2023 22:13   CT VENOGRAM HEAD Result Date: 12/08/2023 CLINICAL DATA:  Seizure EXAM: CT VENOGRAM HEAD TECHNIQUE: Venographic phase images of the brain were obtained following the administration of intravenous contrast. Multiplanar reformats and maximum intensity projections were generated. RADIATION DOSE REDUCTION: This exam was performed according to the departmental dose-optimization program which includes automated exposure control, adjustment of the mA and/or kV according to patient size and/or use of iterative reconstruction technique. CONTRAST:  60mL OMNIPAQUE  IOHEXOL  350 MG/ML SOLN COMPARISON:  None Available. FINDINGS: Superior sagittal sinus: Normal. Straight sinus: Normal. Inferior sagittal sinus, vein of Galen and internal cerebral veins: Normal. 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 Normal left transverse sinus. Sigmoid sinuses: Normal. Visualized jugular veins: Normal. IMPRESSION: No evidence of dural venous sinus thrombosis. Electronically Signed   By: Juanetta Nordmann M.D.   On: 12/08/2023 22:03   CT ANGIO HEAD NECK W WO CM Result Date: 12/08/2023 CLINICAL DATA:  Seizures EXAM: CT ANGIOGRAPHY HEAD AND NECK WITH AND WITHOUT CONTRAST TECHNIQUE: Multidetector CT imaging of the head and neck was performed using the standard protocol during bolus administration of intravenous contrast. Multiplanar CT image reconstructions and MIPs were obtained to evaluate the vascular anatomy. Carotid stenosis measurements (when applicable) are obtained utilizing NASCET criteria, using the distal internal carotid diameter as the denominator. RADIATION DOSE REDUCTION: This exam was performed  according to the departmental dose-optimization program which includes automated exposure control, adjustment of the mA and/or kV according to patient size and/or use of iterative reconstruction technique. CONTRAST:  60mL OMNIPAQUE  IOHEXOL  350 MG/ML SOLN COMPARISON:  Brain MRI 12/08/2023 and 11/30/2023 FINDINGS: CTA NECK FINDINGS Skeleton: No acute abnormality or high grade bony spinal canal stenosis. Other neck: Normal pharynx, larynx and major salivary glands. No cervical lymphadenopathy. Unremarkable thyroid gland. Upper chest: No pneumothorax or pleural effusion. No nodules or masses. Aortic arch: There is no calcific atherosclerosis of the aortic arch. Normal variant aortic arch branching pattern with the left vertebral artery arising independently from the aortic arch. RIGHT carotid system: Normal without aneurysm, dissection or stenosis. LEFT carotid system: Normal without aneurysm, dissection or stenosis. Vertebral arteries: Codominant configuration. There is no dissection, occlusion or flow-limiting stenosis to the skull base (V1-V3 segments). CTA HEAD FINDINGS POSTERIOR CIRCULATION: Minimal atherosclerotic calcification of the left V4 segment. No proximal occlusion of the anterior or inferior cerebellar arteries. Basilar artery is normal. Superior cerebellar arteries are normal. Posterior cerebral arteries are normal. ANTERIOR CIRCULATION: Intracranial internal carotid arteries are normal. Anterior cerebral arteries are normal. Middle cerebral arteries are normal. Venous sinuses: As permitted by contrast timing, patent. Anatomic variants: Fetal origin of the left posterior cerebral artery. Redemonstration of diffuse cytotoxic edema throughout the right cerebral hemisphere. Left cerebellar edema less clearly visualized. Review of the MIP images confirms the above findings. IMPRESSION: 1. No emergent large vessel occlusion or high-grade stenosis of the intracranial arteries. 2. Normal CTA of the neck. 3.  Redemonstration of diffuse cytotoxic  edema throughout the right cerebral hemisphere. Left cerebellar edema less clearly visualized. Electronically Signed   By: Juanetta Nordmann M.D.   On: 12/08/2023 22:02   CT HEAD WO CONTRAST ( ) Result Date: 12/08/2023 CLINICAL DATA:  Stroke suspected. Left-sided hemiplegia. Not following commands. EXAM: CT HEAD WITHOUT CONTRAST TECHNIQUE: Contiguous axial images were obtained from the base of the skull through the vertex without intravenous contrast. RADIATION DOSE REDUCTION: This exam was performed according to the departmental dose-optimization program which includes automated exposure control, adjustment of the mA and/or kV according to patient size and/or use of iterative reconstruction technique. COMPARISON:  MRI of the head without contrast 11/30/2023. FINDINGS: Brain: Expected evolution of a large right hemispheric infarct is noted. Diffuse edema is present with effacement of the sulci and partial effacement the right lateral ventricle. 1-2 mm midline shift is present. Right caudate head infarct is present. No acute or focal left hemispheric infarcts are present. Left cerebellar infarct is noted.  No acute hemorrhage is present. Vascular: Minimal atherosclerotic changes are present at the cavernous internal carotid arteries bilaterally. No hyperdense vessel is present. Skull: Calvarium is intact. No focal lytic or blastic lesions are present. No significant extracranial soft tissue lesion is present. Sinuses/Orbits: The paranasal sinuses and mastoid air cells are clear. The globes and orbits are within normal limits. IMPRESSION: 1. Expected evolution of a large right hemispheric infarct. 2. Diffuse edema is present with effacement of the sulci and partial effacement the right lateral ventricle. 1-2 mm midline shift is present. 3. 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 5. No acute hemorrhage. Electronically Signed   By: Audree Leas M.D.   On:  12/08/2023 16:21   DG CHEST PORT 1 VIEW Result Date: 12/05/2023 CLINICAL DATA:  Intubated EXAM: PORTABLE CHEST 1 VIEW COMPARISON:  11/28/2023 FINDINGS: Single frontal view of the chest demonstrates endotracheal tube overlying tracheal air column, tip 1.5 cm above carina. Enteric catheter passes below diaphragm, tip excluded by collimation. Cardiac silhouette is stable. No acute airspace disease, effusion, or pneumothorax. Moderate gaseous distention of the stomach. No acute bony abnormalities. IMPRESSION: 1. Support devices as above. 2. No acute intrathoracic process. Electronically Signed   By: Bobbye Burrow M.D.   On: 12/05/2023 20:12   MR BRAIN WO CONTRAST Result Date: 11/30/2023 CLINICAL DATA:  Seizure disorder, clinical change. EXAM: MRI HEAD WITHOUT CONTRAST TECHNIQUE: Multiplanar, multiecho pulse sequences of the brain and surrounding structures were obtained without intravenous contrast. COMPARISON:  09/14/2021 FINDINGS: Brain: Restricted diffusion along essentially the entirety of the right cerebral cortex and at the medial right thalamus and to a lesser extent right caudate with swelling. Contralateral extensive diffusion restriction in the left cerebellum. Cluster of small areas of diffusion restriction in the left temporal white matter, juxtacortical. No acute hemorrhage, hydrocephalus, focal mass, or significant shift. Patchy FLAIR hyperintensity in the cerebral white matter which is nonspecific, possibly chronic small vessel ischemia. Vascular: Major flow voids are preserved Skull and upper cervical spine: Posterior scalp swelling which is subgaleal. No abnormal marrow signal. Sinuses/Orbits: No acute finding Case discussed with neurology in epic chat. IMPRESSION: 1. Extensive cortical restricted diffusion in the right cerebral hemisphere and medial right thalamus with contralateral involvement of the cerebellum, pattern compatible with seizure phenomenon in this patient in recent status. 2.  Small foci of restricted diffusion in the left temporal white matter more suggestive of white matter infarcts. There is chronic white matter disease. Electronically Signed   By: Treva Fuchs.D.  On: 11/30/2023 12:47   US  ABDOMEN LIMITED WITH LIVER DOPPLER Result Date: 11/30/2023 CLINICAL DATA:  Elevated liver function studies. History of Budd-Chiari syndrome. EXAM: DUPLEX ULTRASOUND OF LIVER TECHNIQUE: Color and duplex Doppler ultrasound was performed to evaluate the hepatic in-flow and out-flow vessels. COMPARISON:  Ultrasound right upper quadrant 03/02/2021 FINDINGS: Liver: Normal parenchymal echogenicity. Normal hepatic contour without nodularity. No focal lesion, mass or intrahepatic biliary ductal dilatation. Main Portal Vein size: 0.9 cm Portal Vein Velocities Main Prox:  21 cm/sec Main Mid: 17.6 cm/sec Main Dist:  15.5 cm/sec Right: 14.5 cm/sec Left: 10.5 cm/sec Hepatic Vein Velocities Right:  9.9 cm/sec Middle:  14.9 cm/sec Left:  12.9 cm/sec IVC: Present and patent with normal respiratory phasicity. Hepatic Artery Velocity:  91.4 cm/sec Splenic Vein Velocity:  7.1 cm/sec Spleen: 10.8 cm x 6.6 cm x 4.5 cm with a total volume of 167 cm^3 (411 cm^3 is upper limit normal) Portal Vein Occlusion/Thrombus: No Splenic Vein Occlusion/Thrombus: No Ascites: Mild upper abdominal ascites demonstrated. Varices: None Incidental note of a right pleural effusion. Portal venous and hepatic venous flow is in the appropriate direction. IMPRESSION: 1. Patent portal and hepatic veins with appropriate flow direction. No venous thrombosis identified. 2. Normal appearance of the liver. 3. Mild abdominal ascites.  Small right pleural effusion. Electronically Signed   By: Boyce Byes M.D.   On: 11/30/2023 00:46   VAS US  LOWER EXTREMITY VENOUS (DVT) Result Date: 11/28/2023  Lower Venous DVT Study Patient Name:  SHERIDAN HEW  Date of Exam:   11/28/2023 Medical Rec #: [id number]            Accession #:     [id number] Date of Birth: 1971/08/01             Patient Gender: F Patient Age:   89 years Exam Location:  [facility] Procedure:      VAS US  LOWER EXTREMITY VENOUS (DVT) Referring Phys: Donavon Fudge HOFFMAN --------------------------------------------------------------------------------  Indications: Hypoxia.  Risk Factors: None identified. Comparison Study: No prior studies. Performing Technologist: Lerry Ransom RVT  Examination Guidelines: A complete evaluation includes B-mode imaging, spectral Doppler, color Doppler, and power Doppler as needed of all accessible portions of each vessel. Bilateral testing is considered an integral part of a complete examination. Limited examinations for reoccurring indications may be performed as noted. The reflux portion of the exam is performed with the patient in reverse Trendelenburg.  +---------+---------------+---------+-----------+----------+--------------+ RIGHT    CompressibilityPhasicitySpontaneityPropertiesThrombus Aging +---------+---------------+---------+-----------+----------+--------------+ CFV      Full           Yes      Yes                                 +---------+---------------+---------+-----------+----------+--------------+ SFJ      Full                                                        +---------+---------------+---------+-----------+----------+--------------+ FV Prox  Full                                                        +---------+---------------+---------+-----------+----------+--------------+  FV Mid   Full                                                        +---------+---------------+---------+-----------+----------+--------------+ FV DistalFull                                                        +---------+---------------+---------+-----------+----------+--------------+ PFV      Full                                                         +---------+---------------+---------+-----------+----------+--------------+ POP      Full           Yes      Yes                                 +---------+---------------+---------+-----------+----------+--------------+ PTV      Full                                                        +---------+---------------+---------+-----------+----------+--------------+ PERO     Full                                                        +---------+---------------+---------+-----------+----------+--------------+   +---------+---------------+---------+-----------+----------+--------------+ LEFT     CompressibilityPhasicitySpontaneityPropertiesThrombus Aging +---------+---------------+---------+-----------+----------+--------------+ CFV      Full           Yes      Yes                                 +---------+---------------+---------+-----------+----------+--------------+ SFJ      Full                                                        +---------+---------------+---------+-----------+----------+--------------+ FV Prox  Full                                                        +---------+---------------+---------+-----------+----------+--------------+ FV Mid   Full                                                        +---------+---------------+---------+-----------+----------+--------------+  FV DistalFull                                                        +---------+---------------+---------+-----------+----------+--------------+ PFV      Full                                                        +---------+---------------+---------+-----------+----------+--------------+ POP      Full           Yes      Yes                                 +---------+---------------+---------+-----------+----------+--------------+ PTV      Full                                                         +---------+---------------+---------+-----------+----------+--------------+ PERO     Full                                                        +---------+---------------+---------+-----------+----------+--------------+     Summary: RIGHT: - There is no evidence of deep vein thrombosis in the lower extremity.  - No cystic structure found in the popliteal fossa.  LEFT: - There is no evidence of deep vein thrombosis in the lower extremity.  - No cystic structure found in the popliteal fossa.  *See table(s) above for measurements and observations. Electronically signed by Genny Kid MD on 11/28/2023 at 10:01:08 PM.    Final    DG CHEST PORT 1 VIEW Result Date: 11/28/2023 CLINICAL DATA:  [id number] Encounter for central line placement [id number] EXAM: PORTABLE CHEST 1 VIEW COMPARISON:  11/28/2023 FINDINGS: Endotracheal tube is 3.9 cm above the carina. Nasogastric tube is in the left upper abdomen and probably in the proximal stomach. Left subclavian central line tip in the upper SVC. Negative for a pneumothorax. Both lungs are clear. Heart and mediastinum are within normal limits. IMPRESSION: 1. Left subclavian central line tip is in the upper SVC. Negative for a pneumothorax. 2. Endotracheal tube is in good position. 3. No focal lung disease. Electronically Signed   By: Elene Griffes M.D.   On: 11/28/2023 15:04   ECHOCARDIOGRAM COMPLETE Result Date: 11/28/2023    ECHOCARDIOGRAM REPORT   Patient Name:   BEATRIZ QUINTELA Date of Exam: 11/28/2023 Medical Rec #:  [id number]           Height:       62.0 in Accession #:    [id number]          Weight:       142.6 lb Date of Birth:  August 02, 1971            BSA:          1.656 m  Patient Age:    53 years            BP:           85/67 mmHg Patient Gender: F                   HR:           65 bpm. Exam Location:  Inpatient Procedure: 2D Echo, Color Doppler and Cardiac Doppler (Both Spectral and Color            Flow Doppler were utilized during procedure). Indications:     R06.9 DOE  History:        Patient has no prior history of Echocardiogram examinations.                 Risk Factors:Hypertension and Dyslipidemia.  Sonographer:    Sherline Distel Senior RDCS Referring Phys: [phone] Ky Phillips [facility]  Sonographer Comments: Scanned supine on artificial respirator IMPRESSIONS  1. Left ventricular ejection fraction, by estimation, is 40 to 45%. The left ventricle has mildly decreased function. The left ventricle demonstrates regional wall motion abnormalities (see scoring diagram/findings for description). Left ventricular diastolic parameters are consistent with Grade I diastolic dysfunction (impaired relaxation).  2. Right ventricular systolic function is normal. The right ventricular size is normal.  3. A small pericardial effusion is present. The pericardial effusion is circumferential. There is no evidence of cardiac tamponade.  4. The mitral valve is normal in structure. Trivial mitral valve regurgitation.  5. The aortic valve is tricuspid. Aortic valve regurgitation is not visualized. FINDINGS  Left Ventricle: Left ventricular ejection fraction, by estimation, is 40 to 45%. The left ventricle has mildly decreased function. The left ventricle demonstrates regional wall motion abnormalities. The left ventricular internal cavity size was normal in size. There is no left ventricular hypertrophy. Left ventricular diastolic parameters are consistent with Grade I diastolic dysfunction (impaired relaxation).  LV Wall Scoring: The basal anteroseptal segment, basal inferolateral segment, basal anterolateral segment, basal anterior segment, basal inferior segment, and basal inferoseptal segment are hypokinetic. Right Ventricle: The right ventricular size is normal. No increase in right ventricular wall thickness. Right ventricular systolic function is normal. Left Atrium: Left atrial size was normal in size. Right Atrium: Right atrial size was normal in size. Pericardium: A small pericardial effusion is  present. The pericardial effusion is circumferential. There is no evidence of cardiac tamponade. Mitral Valve: The mitral valve is normal in structure. Trivial mitral valve regurgitation. Tricuspid Valve: The tricuspid valve is normal in structure. Tricuspid valve regurgitation is mild. Aortic Valve: The aortic valve is tricuspid. Aortic valve regurgitation is not visualized. Pulmonic Valve: The pulmonic valve was normal in structure. Pulmonic valve regurgitation is trivial. Aorta: The aortic root is normal in size and structure and the ascending aorta was not well visualized. Venous: IVC assessment for right atrial pressure unable to be performed due to mechanical ventilation. IAS/Shunts: No atrial level shunt detected by color flow Doppler.  LEFT VENTRICLE PLAX 2D LVIDd:         4.40 cm     Diastology LVIDs:         3.90 cm     LV e' medial:    4.57 cm/s LV PW:         1.00 cm     LV E/e' medial:  12.5 LV IVS:        0.60 cm     LV e' lateral:   5.33 cm/s LVOT diam:  2.00 cm     LV E/e' lateral: 10.7 LV SV:         55 LV SV Index:   33 LVOT Area:     3.14 cm  LV Volumes (MOD) LV vol d, MOD A2C: 49.9 ml LV vol d, MOD A4C: 64.3 ml LV vol s, MOD A2C: 26.2 ml LV vol s, MOD A4C: 30.4 ml LV SV MOD A2C:     23.7 ml LV SV MOD A4C:     64.3 ml LV SV MOD BP:      26.8 ml RIGHT VENTRICLE RV S prime:     9.79 cm/s TAPSE (M-mode): 1.9 cm LEFT ATRIUM             Index        RIGHT ATRIUM           Index LA diam:        2.80 cm 1.69 cm/m   RA Area:     13.30 cm LA Vol (A2C):   36.0 ml 21.74 ml/m  RA Volume:   34.10 ml  20.59 ml/m LA Vol (A4C):   34.5 ml 20.83 ml/m LA Biplane Vol: 36.5 ml 22.04 ml/m  AORTIC VALVE LVOT Vmax:   92.00 cm/s LVOT Vmean:  63.900 cm/s LVOT VTI:    0.174 m  AORTA Ao Root diam: 3.00 cm MITRAL VALVE               TRICUSPID VALVE MV Area (PHT): 3.91 cm    TR Peak grad:   17.6 mmHg MV Decel Time: 194 msec    TR Vmax:        210.00 cm/s MV E velocity: 56.90 cm/s MV A velocity: 66.30 cm/s  SHUNTS MV  E/A ratio:  0.86        Systemic VTI:  0.17 m                            Systemic Diam: 2.00 cm Dorothye Gathers MD Electronically signed by Dorothye Gathers MD Signature Date/Time: 11/28/2023/2:01:15 PM    Final    Overnight EEG with video Result Date: 11/28/2023 Arleene Lack, MD     12/02/2023  8:04 AM Patient Name: Babs Dabbs MRN: [id number] Epilepsy Attending: Arleene Lack Referring Physician/Provider: Roxan Copes, MD Duration: 11/28/2023 0239 to 11/29/2023 0239 Patient history: 53 y.o. female with hx of alcohol and substance use, Htn, seizures on lamotrigine  and topamax  who was found seizing for unknown duration. EEG to evaluate for seizure Level of alertness:  comatose/ lethargic AEDs during EEG study: LEV, versed  Technical aspects: This EEG study was done with scalp electrodes positioned according to the 10-20 International system of electrode placement. Electrical activity was reviewed with band pass filter of 1-70Hz , sensitivity of 7 uV/mm, display speed of 28mm/sec with a 60Hz  notched filter applied as appropriate. EEG data were recorded continuously and digitally stored.  Video monitoring was available and reviewed as appropriate. Description: EEG showed continuous generalized and lateralized right hemisphere 3 to 6 Hz theta-delta slowing.  Sharp waves were noted in right hemisphere, maximal right temporal region, quasi periodic at 1 2 5  to 1 Hz, at times rhythmic and lasting 10 to 15 seconds without definite evolution. Hyperventilation and photic stimulation were not performed.   ABNORMALITY -Sharp waves, right hemisphere -Continuous slow, generalized and lateralized right hemisphere IMPRESSION: This study showed evidence of epileptogenicity and cortical dysfunction arising from right hemisphere, maximal right temporal  region with increased risk of seizure recurrence. Additionally there is severe diffuse encephalopathy.  No definite seizures were noted Priyanka Suzanne Erps   US  RENAL Result  Date: 11/28/2023 CLINICAL DATA:  Acute kidney injury EXAM: RENAL / URINARY TRACT ULTRASOUND COMPLETE COMPARISON:  None Available. FINDINGS: Right Kidney: Renal measurements: 13 x 6.2 x 4.8 cm = volume: 202 mL. Echogenicity within normal limits. No mass or hydronephrosis visualized. Left Kidney: Renal measurements: 12.3 x 6.3 x 5.2 cm = volume: 209 mL. Echogenicity within normal limits. No mass or hydronephrosis visualized. Bladder: Appears normal for degree of bladder distention. Other: None. IMPRESSION: Normal ultrasound appearance of the kidneys. No solid mass or hydronephrosis. Electronically Signed   By: Boyce Byes M.D.   On: 11/28/2023 03:14   CT ANGIO HEAD NECK W WO CM Result Date: 11/28/2023 CLINICAL DATA:  Neuro deficit, acute, stroke suspected obtunded mentation, rule out basilar thrombosis EXAM: CT ANGIOGRAPHY HEAD AND NECK WITH AND WITHOUT CONTRAST TECHNIQUE: Multidetector CT imaging of the head and neck was performed using the standard protocol during bolus administration of intravenous contrast. Multiplanar CT image reconstructions and MIPs were obtained to evaluate the vascular anatomy. Carotid stenosis measurements (when applicable) are obtained utilizing NASCET criteria, using the distal internal carotid diameter as the denominator. RADIATION DOSE REDUCTION: This exam was performed according to the departmental dose-optimization program which includes automated exposure control, adjustment of the mA and/or kV according to patient size and/or use of iterative reconstruction technique. CONTRAST:  75mL OMNIPAQUE  IOHEXOL  350 MG/ML SOLN COMPARISON:  March 25, 25 CT head. FINDINGS: CTA NECK FINDINGS Aortic arch: Great vessel origins are patent without significant stenosis. Right carotid system: No evidence of dissection, stenosis (50% or greater), or occlusion. Left carotid system: No evidence of dissection, stenosis (50% or greater), or occlusion. Vertebral arteries: Codominant. No evidence of  dissection, stenosis (50% or greater), or occlusion. Skeleton: No acute abnormality. Other neck: No acute abnormality on limited assessment. Upper chest: Visualized lung apices are clear. Review of the MIP images confirms the above findings CTA HEAD FINDINGS Anterior circulation: Bilateral intracranial ICAs, MCAs, and ACAs are patent without proximal hemodynamically significant stenosis. Azygos ACA, anatomic variant. Posterior circulation: Bilateral intradural vertebral arteries, basilar artery and bilateral posterior cerebral arteries are patent without proximal hemodynamically significant stenosis. Venous sinuses: As permitted by contrast timing, patent. Review of the MIP images confirms the above findings IMPRESSION: No large vessel occlusion or proximal hemodynamically significant stenosis. Electronically Signed   By: Stevenson Elbe M.D.   On: 11/28/2023 01:48   Portable Chest x-ray Result Date: 11/28/2023 CLINICAL DATA:  Check endotracheal tube and gastric catheter placement EXAM: PORTABLE CHEST 1 VIEW COMPARISON:  Film from the previous day. FINDINGS: Cardiac shadow is stable. Endotracheal tube is noted with the tip directed into the mainstem bronchus. This should be withdrawn approximately 3 cm. Gastric catheter extends into the stomach although the proximal side port is noted in the distal esophagus. This could also be advanced deeper into the stomach. Lungs are clear. IMPRESSION: Tubes and lines as described above. Gastric catheter should be advanced deeper into the stomach in the endotracheal tube should be withdrawn as described. These results will be called to the ordering clinician or representative by the Radiologist Assistant, and communication documented in the PACS or [employer]. Electronically Signed   By: Violeta Grey M.D.   On: 11/28/2023 01:05   CT Cervical Spine Wo Contrast Result Date: 11/27/2023 CLINICAL DATA:  Neck trauma. Intoxicated or obtunded. Seizures with a  fall. EXAM:  CT CERVICAL SPINE WITHOUT CONTRAST TECHNIQUE: Multidetector CT imaging of the cervical spine was performed without intravenous contrast. Multiplanar CT image reconstructions were also generated. RADIATION DOSE REDUCTION: This exam was performed according to the departmental dose-optimization program which includes automated exposure control, adjustment of the mA and/or kV according to patient size and/or use of iterative reconstruction technique. COMPARISON:  08/05/2021 FINDINGS: Alignment: Normal alignment. Skull base and vertebrae: Skull base appears intact. No vertebral compression deformities. No focal bone lesion or bone destruction. Bone cortex appears intact. Soft tissues and spinal canal: No prevertebral soft tissue swelling. No abnormal paraspinal soft tissue mass or infiltration. Disc levels: Degenerative changes with disc space narrowing and endplate osteophyte formation throughout. Changes are most prominent at C5-6 where there is disc osteophyte complex causing anterior effacement of the thecal sac, similar to prior study. Upper chest: Motion artifact.  Lung apices appear clear. Other: None. IMPRESSION: Normal alignment. No acute displaced fractures are demonstrated. Degenerative changes. Electronically Signed   By: Boyce Byes M.D.   On: 11/27/2023 21:29   CT Head Wo Contrast Result Date: 11/27/2023 CLINICAL DATA:  Mental status change of unknown cause. Seizures with fall. Intoxicated or obtunded. EXAM: CT HEAD WITHOUT CONTRAST TECHNIQUE: Contiguous axial images were obtained from the base of the skull through the vertex without intravenous contrast. RADIATION DOSE REDUCTION: This exam was performed according to the departmental dose-optimization program which includes automated exposure control, adjustment of the mA and/or kV according to patient size and/or use of iterative reconstruction technique. COMPARISON:  CT head 11/06/2022.  MRI brain 09/14/2021 FINDINGS: Brain: No evidence of acute  infarction, hemorrhage, hydrocephalus, extra-axial collection or mass lesion/mass effect. Vascular: No hyperdense vessel or unexpected calcification. Skull: Normal. Negative for fracture or focal lesion. Sinuses/Orbits: Mucosal thickening in the paranasal sinuses with secretions in the left maxillary antrum. No acute air-fluid levels. Mastoid air cells are clear. Other: None. IMPRESSION: No acute intracranial abnormalities. Chronic inflammatory changes suggested in the paranasal sinuses. Electronically Signed   By: Boyce Byes M.D.   On: 11/27/2023 21:26   DG Chest Port 1 View Result Date: 11/27/2023 CLINICAL DATA:  ams EXAM: PORTABLE CHEST 1 VIEW COMPARISON:  Chest x-ray 07/12/2021 FINDINGS: The heart and mediastinal contours are unchanged No focal consolidation. No pulmonary edema. No pleural effusion. No pneumothorax. No acute osseous abnormality. IMPRESSION: No active disease. Electronically Signed   By: Morgane  Naveau M.D.   On: 11/27/2023 21:09    Labs:  CBC: Recent Labs    12/14/23 0555 12/17/23 0633 12/19/23 0607 12/20/23 0434  WBC 17.2* 9.2 12.8* 17.5*  HGB 11.0* 9.1* 10.3* 11.0*  HCT 34.2* 29.3* 32.1* 34.3*  PLT 550* 355 339 323    COAGS: Recent Labs    11/30/23 0507 12/01/23 0449 12/02/23 0459 12/03/23 0601  INR 2.3* 1.5* 1.2 1.1    BMP: Recent Labs    12/15/23 0740 12/16/23 0743 12/19/23 0607 12/21/23 0548  NA 150* 146* 140 139  K 3.7 3.8 3.5 3.7  CL 122* 120* 114* 113*  CO2 15* 16* 14* 17*  GLUCOSE 140* 146* 130* 107*  BUN 58* 52* 29* 34*  CALCIUM  8.7* 8.4* 8.5* 8.7*  CREATININE 1.07* 1.00 0.68 0.78  GFRNONAA >60 >60 >60 >60    LIVER FUNCTION TESTS: Recent Labs    12/08/23 0931 12/11/23 0530 12/14/23 0555 12/19/23 0607  BILITOT 0.7 0.6 0.6 0.4  AST 136* 82* 78* 78*  ALT 166* 106* 66* 48*  ALKPHOS 55 47 56 57  PROT 6.1* 6.0* 5.6* 5.0*  ALBUMIN  3.1* 3.0* 2.9* 2.5*     Assessment and Plan:  Request for image guided percutaneous  gastrostomy tube placement  - patient received dose of ASA 4/18 ~10 am. Appears to be single dose. Can do Monday rather than full 5 day washout, but ASA should be held moving forward in weekend. Hold Monday AM SQ heparin .  - WBC 17.5, will review weekend trend Monday for new infection concern - Hgb, plts within acceptable range - CT 4/17 available and reviewed by Dr. Nereida Banning who approves g-tube. Percutaneous window for gastrostomy tube placement present with the stomach opposing the abdominal wall between the tip of the left lobe of the liver and the splenic flexure of the colon. - patient has been extubated and is no longer requiring care in ICU. Tolerating RA. - No contraindication identified for procedure Monday  Risks and benefits image guided gastrostomy tube placement was discussed with the patient's family including, but not limited to the need for a barium enema during the procedure, bleeding, infection, peritonitis and/or damage to adjacent structures.  All of the patient's questions were answered, patient is agreeable to proceed.  Consent signed and in chart.   Thank you for allowing our service to participate in Demara Lover 's care.    Electronically Signed: Terressa Fess, NP   12/21/2023, 11:06 AM    I spent a total of 40 Minutes    in face to face in clinical consultation, greater than 50% of which was counseling/coordinating care for image guided gastrostomy tube placement   (A copy of this note was sent to the referring provider and the time of visit.)

## 2023-12-21 NOTE — Progress Notes (Signed)
 Subjective: Meredith Park.  No new concerns.  ROS: negative except above  Examination  Vital signs in last 24 hours: Temp:  [97.4 F (36.3 C)-98.3 F (36.8 C)] 98.3 F (36.8 C) (04/18 0753) Pulse Rate:  [83-135] 92 (04/18 0753) Resp:  [18-19] 18 (04/18 0753) BP: (105-139)/(68-99) 112/80 (04/18 0753) SpO2:  [91 %-100 %] 98 % (04/18 0753) Weight:  [63.6 kg] 63.6 kg (04/18 0600)  General: lying in bed, NAD Neuro: Awake, alert, able to tell me her name and that she is at a hospital, month: May, however her voice is still severely hypophonic,  follows commands but has profound left hemineglect, antigravity strength in right upper and right lower extremity, withdraws to noxious stimuli in left upper and lower extremity   Basic Metabolic Panel: Recent Labs  Lab 12/15/23 0740 12/16/23 0743 12/19/23 0607 12/21/23 0548  NA 150* 146* 140 139  K 3.7 3.8 3.5 3.7  CL 122* 120* 114* 113*  CO2 15* 16* 14* 17*  GLUCOSE 140* 146* 130* 107*  BUN 58* 52* 29* 34*  CREATININE 1.07* 1.00 0.68 0.78  CALCIUM  8.7* 8.4* 8.5* 8.7*  MG  --  2.4  --   --     CBC: Recent Labs  Lab 12/17/23 0633 12/19/23 0607 12/20/23 0434  WBC 9.2 12.8* 17.5*  NEUTROABS 6.4  --  14.0*  HGB 9.1* 10.3* 11.0*  HCT 29.3* 32.1* 34.3*  MCV 89.1 87.2 86.8  PLT 355 339 323     Coagulation Studies: No results for input(s): LABPROT, INR in the last 72 hours.  Imaging personally reviewed  MR brain wo contrast 12/20/2023: Evolutionary changes throughout the right cerebral and left cerebellar hemispheres as described above most compatible with evolving injury from recent status epilepticus. T2 hyperintensity throughout the right cerebral hemispheric white matter and basal ganglia has increased, however brain swelling has decreased with resolved mass effect. New cortical laminar necrosis. 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  TTE  11/28/2023: No PFO, no thrombus  CTA H/N 12/08/2023: No emergent large vessel occlusion or high-grade stenosis of the intracranial arteries. Normal CTA of the neck.  ASSESSMENT AND PLAN: 53 y.o. female past medical history of alcohol and substance abuse, hypertension, seizures was found wedged between bed and nightstand and seizing for unknown duration.  Brought in after Versed  in the field with resolution of clinical seizure activity.  Obtunded mentation after significant passage of time prompted further workup. MRI brain with cortical changes in the right hemisphere and crossed cerebellar diaschisis, concerning more for a postictal phenomenology. EEG with LPD's likely ictal in nature.  CT angiogram was done-no emergent occlusion. CSF studies bland with no pleocytosis, mildly elevated protein, negative meningitis/encephalitis panel, no organisms on CSF gram stain and no growth on CSF cultures x 5 days. Exam remains poor despite resolution of ictal activity on the EEG with AED adjustments.  Repeat head CT done that shows extensive right hemispheric edema more than expected from postictal from neurology. Repeat MRI brain shows progression of earlier noted unihemispheric edema of the right cerebral hemisphere, which now includes most of the white matter. Completed 3 days of IV solumedrol 1000mg  .     Status epilepticus convulsive, resolved Acute encephalopathy, resolved Acute ischemic stroke - Etiology of her cortical unilateral brain edema remains unclear, still suspect this is post ictal but with progression of edema.  Other differentials include potential autoimmune/paraneoplastic/vasculitic pathology   - Serum Autoimmune encephalopathy  panel, serum paraneoplastic encephalopathy panel pending. Anti TPO thyroglobulin antibodies negative - Stroke etiology: cryptogenic  Recommendations - continue Topamax  100mg  daily, Lamotrigine  250mg  BID - Asa 81mg  daily and atorva 40mg  daily for secondary stroke  prevention - Currently on prednisone  taper at 50 mg with plan to decrease by 10 mg every week -Management of medical comorbidities per primary team - Neurology will sign off.  Please call us  for any further questions   I have spent a total of 35 minutes with the patient reviewing hospital notes,  test results, labs and examining the patient as well as establishing an assessment and plan. > 50% of time was spent in direct patient care.      Arlin Krebs Epile[facility] For questions after 5pm please refer to AMION to reach the Neurologist on call

## 2023-12-21 NOTE — Progress Notes (Signed)
 Physical Therapy Progress Note Patient Details Name: Meredith Park MRN: [id number] DOB: 12/13/1970 Today's Date: 12/21/2023   History of Present Illness 53 yo female admitted 11/27/23 s/p seizure with acute encephalopathy, acute respiratory failure, septic shock secondary to aspiration PNA. LP 3/26, ETT 3/26-4/2, re-intubated 4/2. Extubated 4/7. PMH: HTN, HLD ETOH abuse hx, seizure    PT Comments  Pt goals were assessed and remain appropriate at this time. Pt continues to slowly progress towards goals; pt fluctuates in functional abilities. Currently pt is Mod A +2 for sit to stand, one person assist for getting to EOB and 2 person Max A for step pivot transfer from EOB to recliner. Pt requires multi modal cues and assist with wgt shifting in order to progress LLE toward recliner from EOB. Pt does demonstrate strength in L glute and hamstring with posterior movement of LLE. Due to pt current functional status, home set up and available assistance at home recommending skilled physical therapy services < 3 hours/day in order to address strength, balance and functional mobility to decrease risk for falls, injury, immobility, skin break down and re-hospitalization.      If plan is discharge home, recommend the following: Two people to help with walking and/or transfers;Assistance with cooking/housework;Assist for transportation;Help with stairs or ramp for entrance   Can travel by private vehicle     No  Equipment Recommendations  Wheelchair cushion (measurements PT);Wheelchair (measurements PT);Hospital bed;Hoyer lift       Precautions / Restrictions Precautions Precautions: Fall;Other (comment) Recall of Precautions/Restrictions: Impaired Precaution/Restrictions Comments: rectal pouch,cortrak, R wrist restraint Restrictions Weight Bearing Restrictions Per Provider Order: No     Mobility  Bed Mobility     General bed mobility comments: See OT note.    Transfers Overall transfer  level: Needs assistance Equipment used: 2 person hand held assist, Hemi-walker Transfers: Sit to/from Stand, Bed to chair/wheelchair/BSC Sit to Stand: +2 physical assistance, Mod assist   Step pivot transfers: +2 physical assistance, Max assist, +2 safety/equipment       General transfer comment: Pt was able to stand at EOB for ~ 30 seconds wtih Mod A; decreased L Push in standing, pt requires assist for positioning/biomechanics, initiation and motor planning for sitting <> standing. Max A +2 for stepping from EOB to recliner with assist wgt shifting, step by step sequencing, assist with AD, pt was able to activate glutes/hamstrings in order to progress LLE posteriorly with facilitation of R wgt shift. Pt had difficulty wgt shifting to the L even with Max A due to weakness in the LLE and intermittent buckle. Pt was able to keep LLE hyperextended ~75% of transfer. Difficulty sequencing to bend at hips/knees with standing to sitting requiring physical assist at the R in order to facilitate movement pattern for safe sitting.    Ambulation/Gait     Pre-gait activities: Took steps from EOB to recliner with hyperextension and occasional buckling of the LLE, pivoting on R forefoot and despite max assist difficulty wgt shifting to the L. General Gait Details: unable    Modified Rankin (Stroke Patients Only) Modified Rankin (Stroke Patients Only) Pre-Morbid Rankin Score: No symptoms Modified Rankin: Severe disability     Balance Overall balance assessment: Needs assistance Sitting-balance support: Feet supported Sitting balance-Leahy Scale: Poor Sitting balance - Comments: heavy L lean requiring Max A with intermittent Min A Postural control: Left lateral lean Standing balance support: Bilateral upper extremity supported Standing balance-Leahy Scale: Poor Standing balance comment: requires external support to remain standing.  Communication Communication Communication:  Impaired Factors Affecting Communication: Difficulty expressing self;Reduced clarity of speech  Cognition Arousal: Alert Behavior During Therapy: Flat affect   PT - Cognitive impairments: Difficult to assess Difficult to assess due to: Impaired communication       PT - Cognition Comments: attempting to mouth words. Hypophonic voice. Following commands: Impaired Following commands impaired: Follows one step commands with increased time    Cueing Cueing Techniques: Verbal cues, Tactile cues, Visual cues     General Comments General comments (skin integrity, edema, etc.): No signs/symptoms of cardiac/respiratory distress during session.      Pertinent Vitals/Pain Pain Assessment Pain Assessment: Faces Faces Pain Scale: Hurts little more Breathing: normal Negative Vocalization: occasional moan/groan, low speech, negative/disapproving quality Facial Expression: sad, frightened, frown Body Language: relaxed Consolability: no need to console PAINAD Score: 2 Pain Location: with mobility; unsure of what hurts Pain Descriptors / Indicators: Grimacing Pain Intervention(s): Monitored during session           PT Goals (current goals can now be found in the care plan section) Acute Rehab PT Goals Patient Stated Goal: unable to state PT Goal Formulation: With patient/family Time For Goal Achievement: 01/04/24 Potential to Achieve Goals: Fair Progress towards PT goals: Progressing toward goals    Frequency    Min 2X/week      PT Plan  Continue with current POC     Co-evaluation PT/OT/SLP Co-Evaluation/Treatment: Yes Reason for Co-Treatment: Complexity of the patient's impairments (multi-system involvement);For patient/therapist safety;To address functional/ADL transfers PT goals addressed during session: Mobility/safety with mobility;Balance        AM-PAC PT "6 Clicks" Mobility   Outcome Measure  Help needed turning from your back to your side while in a flat bed  without using bedrails?: A Lot Help needed moving from lying on your back to sitting on the side of a flat bed without using bedrails?: A Lot Help needed moving to and from a bed to a chair (including a wheelchair)?: Total Help needed standing up from a chair using your arms (e.g., wheelchair or bedside chair)?: Total Help needed to walk in hospital room?: Total Help needed climbing 3-5 steps with a railing? : Total 6 Click Score: 8    End of Session Equipment Utilized During Treatment: Gait belt Activity Tolerance: Patient tolerated treatment well;Patient limited by fatigue Patient left: with call bell/phone within reach;with restraints reapplied;in chair;with chair alarm set Nurse Communication: Mobility status PT Visit Diagnosis: Muscle weakness (generalized) (M62.81);Difficulty in walking, not elsewhere classified (R26.2);Other symptoms and signs involving the nervous system (R29.898)     Time: [id number] PT Time Calculation (min) (ACUTE ONLY): 36 min  Charges:    $Therapeutic Activity: 8-22 mins PT General Charges $$ ACUTE PT VISIT: 1 Visit                    Sloan Duncans, DPT, CLT  Acute Rehabilitation Services Office: [phone] (Secure chat preferred)    [name] 12/21/2023, 12:39 PM

## 2023-12-21 NOTE — Progress Notes (Signed)
 Physical Therapy Treatment Patient Details Name: Meredith Park MRN: [id number] DOB: 1971-06-23 Today's Date: 12/21/2023   History of Present Illness 53 yo female admitted 11/27/23 s/p seizure with acute encephalopathy, acute respiratory failure, septic shock secondary to aspiration PNA. LP 3/26, ETT 3/26-4/2, re-intubated 4/2. Extubated 4/7. PMH: HTN, HLD ETOH abuse hx, seizure    PT Comments  On nursing request assisted with pt transfer back to bed. Pt was Min A +2 for sit to stand from recliner and 2 person Max A to total A for transfer from recliner to EOB. Pt had difficulty with motor planning and hip flexion to get to sitting from standing despite heavy mutli modal cues. Pt was Mod A with cues for sequencing with rolling to the L and Total A for rolling R. Due to pt current functional status, home set up and available assistance at home recommending skilled physical therapy services < 3 hours/day in order to address strength, balance and functional mobility to decrease risk for falls, injury, immobility, skin break down and re-hospitalization.      If plan is discharge home, recommend the following: Two people to help with walking and/or transfers;Assistance with cooking/housework;Assist for transportation;Help with stairs or ramp for entrance;Supervision due to cognitive status   Can travel by private vehicle     No  Equipment Recommendations  Wheelchair cushion (measurements PT);Wheelchair (measurements PT);Hospital bed;Hoyer lift       Precautions / Restrictions Precautions Precautions: Fall;Other (comment) Recall of Precautions/Restrictions: Impaired Precaution/Restrictions Comments: rectal pouch,cortrak, R wrist restraint Restrictions Weight Bearing Restrictions Per Provider Order: No     Mobility  Bed Mobility Overal bed mobility: Needs Assistance Bed Mobility: Sit to Supine, Rolling Rolling: Total assist, Mod assist     Sit to supine: Max assist, +2 for physical  assistance   General bed mobility comments: Max A to maneuver BLEs and trunk. Pt requires Max A +2 for scooting up in the bed.    Transfers Overall transfer level: Needs assistance Equipment used: 2 person hand held assist Transfers: Sit to/from Stand, Bed to chair/wheelchair/BSC Sit to Stand: +2 physical assistance, Min assist   Step pivot transfers: +2 physical assistance, Max assist, +2 safety/equipment, Total assist       General transfer comment: Min A STS from recliner , Max A +2 to total A to faciliate stepping and for weight shifts.  Difficulty sequencing transfers and sitting motion despite maximum tactile cues at the hip to faciltiate hip flexion requiring total A to sit EOB.    Ambulation/Gait     Pre-gait activities: Took steps from EOB to recliner with hyperextension and occasional buckling of the LLE, pivoting on R forefoot and despite max assist difficulty wgt shifting to the L. General Gait Details: unable    Modified Rankin (Stroke Patients Only) Modified Rankin (Stroke Patients Only) Pre-Morbid Rankin Score: No symptoms Modified Rankin: Severe disability     Balance Overall balance assessment: Needs assistance Sitting-balance support: Feet supported Sitting balance-Leahy Scale: Poor Sitting balance - Comments: heavy L lean requiring Max A with intermittent Min A Postural control: Left lateral lean Standing balance support: Bilateral upper extremity supported Standing balance-Leahy Scale: Poor Standing balance comment: requires external support to remain standing.        Communication Communication Communication: Impaired Factors Affecting Communication: Difficulty expressing self;Reduced clarity of speech  Cognition Arousal: Alert Behavior During Therapy: Flat affect   PT - Cognitive impairments: Difficult to assess Difficult to assess due to: Impaired communication  PT - Cognition Comments: attempting to mouth words. Hypophonic  voice. Following commands: Impaired Following commands impaired: Follows one step commands with increased time, Follows one step commands inconsistently    Cueing Cueing Techniques: Verbal cues, Tactile cues, Visual cues     General Comments General comments (skin integrity, edema, etc.): No signs/symptoms of cardiac/respiratory distress during session      Pertinent Vitals/Pain Pain Assessment Pain Assessment: Faces Faces Pain Scale: Hurts little more Breathing: normal Negative Vocalization: occasional moan/groan, low speech, negative/disapproving quality Facial Expression: sad, frightened, frown Body Language: relaxed Consolability: no need to console PAINAD Score: 2 Pain Location: with mobility; unsure of what hurts Pain Descriptors / Indicators: Grimacing Pain Intervention(s): Monitored during session     PT Goals (current goals can now be found in the care plan section) Acute Rehab PT Goals Patient Stated Goal: unable to state PT Goal Formulation: With patient/family Time For Goal Achievement: 01/04/24 Potential to Achieve Goals: Fair Progress towards PT goals: Progressing toward goals    Frequency    Min 2X/week      PT Plan  Continue with current POC     Co-evaluation PT/OT/SLP Co-Evaluation/Treatment: Yes Reason for Co-Treatment: Complexity of the patient's impairments (multi-system involvement);For patient/therapist safety;To address functional/ADL transfers PT goals addressed during session: Mobility/safety with mobility;Balance OT goals addressed during session: ADL's and self-care;Strengthening/ROM SLP goals addressed during session: Swallowing;Cognition;Communication    AM-PAC PT "6 Clicks" Mobility   Outcome Measure  Help needed turning from your back to your side while in a flat bed without using bedrails?: A Lot Help needed moving from lying on your back to sitting on the side of a flat bed without using bedrails?: A Lot Help needed moving to  and from a bed to a chair (including a wheelchair)?: Total Help needed standing up from a chair using your arms (e.g., wheelchair or bedside chair)?: Total Help needed to walk in hospital room?: Total Help needed climbing 3-5 steps with a railing? : Total 6 Click Score: 8    End of Session Equipment Utilized During Treatment: Gait belt Activity Tolerance: Patient tolerated treatment well;Patient limited by fatigue Patient left: with call bell/phone within reach;with restraints reapplied;in bed;with bed alarm set Nurse Communication: Mobility status PT Visit Diagnosis: Muscle weakness (generalized) (M62.81);Difficulty in walking, not elsewhere classified (R26.2);Other symptoms and signs involving the nervous system (R29.898)     Time: 4098-1191 PT Time Calculation (min) (ACUTE ONLY): 15 min  Charges:    $Therapeutic Activity: 8-22 mins PT General Charges $$ ACUTE PT VISIT: 1 Visit                     Sloan Duncans, DPT, CLT  Acute Rehabilitation Services Office: [phone] (Secure chat preferred)    [name] 12/21/2023, 3:58 PM

## 2023-12-21 NOTE — Progress Notes (Signed)
 Per MD; stop tube feeds and flushes for PEG tube placement. Patient NPO.

## 2023-12-21 NOTE — Plan of Care (Signed)
  Problem: Education: Goal: Knowledge of General Education information will improve Description: Including pain rating scale, medication(s)/side effects and non-pharmacologic comfort measures Outcome: Progressing   Problem: Health Behavior/Discharge Planning: Goal: Ability to manage health-related needs will improve Outcome: Progressing   Problem: Clinical Measurements: Goal: Ability to maintain clinical measurements within normal limits will improve Outcome: Progressing Goal: Will remain free from infection Outcome: Progressing Goal: Diagnostic test results will improve Outcome: Progressing Goal: Respiratory complications will improve Outcome: Progressing Goal: Cardiovascular complication will be avoided Outcome: Progressing   Problem: Activity: Goal: Risk for activity intolerance will decrease Outcome: Progressing   Problem: Nutrition: Goal: Adequate nutrition will be maintained Outcome: Progressing   Problem: Coping: Goal: Level of anxiety will decrease Outcome: Progressing   Problem: Elimination: Goal: Will not experience complications related to bowel motility Outcome: Progressing Goal: Will not experience complications related to urinary retention Outcome: Progressing   Problem: Pain Managment: Goal: General experience of comfort will improve and/or be controlled Outcome: Progressing   Problem: Safety: Goal: Ability to remain free from injury will improve Outcome: Progressing   Problem: Skin Integrity: Goal: Risk for impaired skin integrity will decrease Outcome: Progressing   Problem: Activity: Goal: Ability to tolerate increased activity will improve Outcome: Progressing   Problem: Respiratory: Goal: Ability to maintain a clear airway and adequate ventilation will improve Outcome: Progressing   Problem: Role Relationship: Goal: Method of communication will improve Outcome: Progressing   Problem: Education: Goal: Ability to describe self-care  measures that may prevent or decrease complications (Diabetes Survival Skills Education) will improve Outcome: Progressing Goal: Individualized Educational Video(s) Outcome: Progressing   Problem: Coping: Goal: Ability to adjust to condition or change in health will improve Outcome: Progressing   Problem: Fluid Volume: Goal: Ability to maintain a balanced intake and output will improve Outcome: Progressing   Problem: Health Behavior/Discharge Planning: Goal: Ability to identify and utilize available resources and services will improve Outcome: Progressing Goal: Ability to manage health-related needs will improve Outcome: Progressing   Problem: Metabolic: Goal: Ability to maintain appropriate glucose levels will improve Outcome: Progressing   Problem: Nutritional: Goal: Maintenance of adequate nutrition will improve Outcome: Progressing Goal: Progress toward achieving an optimal weight will improve Outcome: Progressing   Problem: Skin Integrity: Goal: Risk for impaired skin integrity will decrease Outcome: Progressing   Problem: Tissue Perfusion: Goal: Adequacy of tissue perfusion will improve Outcome: Progressing   Problem: Safety: Goal: Non-violent Restraint(s) Outcome: Progressing   Problem: Education: Goal: Knowledge of disease or condition will improve Outcome: Progressing Goal: Knowledge of secondary prevention will improve (MUST DOCUMENT ALL) Outcome: Progressing Goal: Knowledge of patient specific risk factors will improve (DELETE if not current risk factor) Outcome: Progressing   Problem: Ischemic Stroke/TIA Tissue Perfusion: Goal: Complications of ischemic stroke/TIA will be minimized Outcome: Progressing   Problem: Coping: Goal: Will verbalize positive feelings about self Outcome: Progressing Goal: Will identify appropriate support needs Outcome: Progressing   Problem: Health Behavior/Discharge Planning: Goal: Ability to manage health-related  needs will improve Outcome: Progressing Goal: Goals will be collaboratively established with patient/family Outcome: Progressing   Problem: Self-Care: Goal: Ability to participate in self-care as condition permits will improve Outcome: Progressing Goal: Verbalization of feelings and concerns over difficulty with self-care will improve Outcome: Progressing Goal: Ability to communicate needs accurately will improve Outcome: Progressing   Problem: Nutrition: Goal: Risk of aspiration will decrease Outcome: Progressing Goal: Dietary intake will improve Outcome: Progressing

## 2023-12-21 NOTE — Progress Notes (Signed)
 [facility], to restart Osmolyte tonight.

## 2023-12-21 NOTE — Progress Notes (Signed)
 Speech Language Pathology Treatment: Dysphagia;Cognitive-Linquistic  Patient Details Name: Meredith Park MRN: [id number] DOB: 02-Apr-1971 Today's Date: 12/21/2023 Time: 8657-8469 SLP Time Calculation (min) (ACUTE ONLY): 20 min  Assessment / Plan / Recommendation Clinical Impression  Pt seen during PT/OT session; pt sitting supported edge of bed. Able to direct gaze to midline, slightly left with verbal, auditory, visual and contextual cues in a functional task. Pt attentive to food and drink, still struggling to initiate oral manipulation of puree, straw and sips of water . Oral holding and anterior spillage prevalent. Pt easily distracted from initiation and swallowing. Pt did demonstrate some improvement phonating today. Took advantage of upright positioning to model deep breathing and sustained phonation; pt able to phonate softly on command at least 5 times in session. Will continue efforts.    HPI HPI: 53 yo female presenting s/p seizure with vomiting. Admitted with acute encephalopathy, acute respiratory failure, septic shock secondary to aspiration PNA. ETT 3/25-4/2; reintubated for stridor and secretions 4/2-4/7. MRI 4/6: diffuse cortical edema and slight increased vascularity to the  right hemisphere, possibly postictal. PMH: HTN, HLD, ETOH abuse hx, seizure. Previous SLUMS 2023: 26/30 (errors primarily with delayed recall, mild word-finding).      SLP Plan  Continue with current plan of care      Recommendations for follow up therapy are one component of a multi-disciplinary discharge planning process, led by the attending physician.  Recommendations may be updated based on patient status, additional functional criteria and insurance authorization.    Recommendations  Diet recommendations: NPO                  Oral care QID   Frequent or constant Supervision/Assistance       Continue with current plan of care     Rolen Conger, Hardin Leys  12/21/2023, 1:48 PM

## 2023-12-22 DIAGNOSIS — I214 Non-ST elevation (NSTEMI) myocardial infarction: Secondary | ICD-10-CM | POA: Diagnosis not present

## 2023-12-22 DIAGNOSIS — E43 Unspecified severe protein-calorie malnutrition: Secondary | ICD-10-CM | POA: Diagnosis not present

## 2023-12-22 DIAGNOSIS — K7200 Acute and subacute hepatic failure without coma: Secondary | ICD-10-CM | POA: Diagnosis not present

## 2023-12-22 DIAGNOSIS — G40901 Epilepsy, unspecified, not intractable, with status epilepticus: Secondary | ICD-10-CM | POA: Diagnosis not present

## 2023-12-22 LAB — GLUCOSE, CAPILLARY
Glucose-Capillary: 97 mg/dL (ref 70–99)
Glucose-Capillary: 119 mg/dL — ABNORMAL HIGH (ref 70–99)
Glucose-Capillary: 177 mg/dL — ABNORMAL HIGH (ref 70–99)
Glucose-Capillary: 194 mg/dL — ABNORMAL HIGH (ref 70–99)
Glucose-Capillary: 125 mg/dL — ABNORMAL HIGH (ref 70–99)
Glucose-Capillary: 87 mg/dL (ref 70–99)

## 2023-12-22 LAB — CBC WITH DIFFERENTIAL/PLATELET
Abs Immature Granulocytes: 0.76 10*3/uL — ABNORMAL HIGH (ref 0.00–0.07)
Basophils Absolute: 0 10*3/uL (ref 0.0–0.1)
Basophils Relative: 0 %
Eosinophils Absolute: 0 10*3/uL (ref 0.0–0.5)
Eosinophils Relative: 0 %
HCT: 29.8 % — ABNORMAL LOW (ref 36.0–46.0)
Hemoglobin: 9.8 g/dL — ABNORMAL LOW (ref 12.0–15.0)
Immature Granulocytes: 5 %
Lymphocytes Relative: 7 %
Lymphs Abs: 1.2 10*3/uL (ref 0.7–4.0)
MCH: 29 pg (ref 26.0–34.0)
MCHC: 32.9 g/dL (ref 30.0–36.0)
MCV: 88.2 fL (ref 80.0–100.0)
Monocytes Absolute: 0.4 10*3/uL (ref 0.1–1.0)
Monocytes Relative: 3 %
Neutro Abs: 13.6 10*3/uL — ABNORMAL HIGH (ref 1.7–7.7)
Neutrophils Relative %: 85 %
Platelets: 229 10*3/uL (ref 150–400)
RBC: 3.38 MIL/uL — ABNORMAL LOW (ref 3.87–5.11)
RDW: 17.1 % — ABNORMAL HIGH (ref 11.5–15.5)
WBC: 16 10*3/uL — ABNORMAL HIGH (ref 4.0–10.5)
nRBC: 0 % (ref 0.0–0.2)

## 2023-12-22 LAB — BASIC METABOLIC PANEL WITH GFR
Anion gap: 8 (ref 5–15)
BUN: 36 mg/dL — ABNORMAL HIGH (ref 6–20)
CO2: 17 mmol/L — ABNORMAL LOW (ref 22–32)
Calcium: 8.5 mg/dL — ABNORMAL LOW (ref 8.9–10.3)
Chloride: 113 mmol/L — ABNORMAL HIGH (ref 98–111)
Creatinine, Ser: 0.73 mg/dL (ref 0.44–1.00)
GFR, Estimated: >60 mL/min (ref >60)
Glucose, Bld: 160 mg/dL — ABNORMAL HIGH (ref 70–99)
Potassium: 3.8 mmol/L (ref 3.5–5.1)
Sodium: 138 mmol/L (ref 135–145)

## 2023-12-22 LAB — MISC LABCORP TEST (SEND OUT): Labcorp test code: 505575

## 2023-12-22 MED ORDER — LOSARTAN POTASSIUM 25 MG PO TABS
25.0000 mg | ORAL_TABLET | Freq: Every day | ORAL | Status: DC
  Administered 2023-12-23 – 2023-12-26 (×2): 25 mg via ORAL
  Filled 2023-12-22 (×2): qty 1

## 2023-12-22 NOTE — Plan of Care (Signed)
  Problem: Nutrition: Goal: Risk of aspiration will decrease Outcome: Not Progressing Goal: Dietary intake will improve Outcome: Not Progressing   Problem: Self-Care: Goal: Ability to participate in self-care as condition permits will improve Outcome: Not Progressing Goal: Verbalization of feelings and concerns over difficulty with self-care will improve Outcome: Not Progressing Goal: Ability to communicate needs accurately will improve Outcome: Not Progressing   Problem: Ischemic Stroke/TIA Tissue Perfusion: Goal: Complications of ischemic stroke/TIA will be minimized Outcome: Not Progressing   Problem: Education: Goal: Knowledge of disease or condition will improve Outcome: Not Progressing Goal: Knowledge of secondary prevention will improve (MUST DOCUMENT ALL) Outcome: Not Progressing Goal: Knowledge of patient specific risk factors will improve (DELETE if not current risk factor) Outcome: Not Progressing   Problem: Safety: Goal: Non-violent Restraint(s) Outcome: Not Progressing   Problem: Tissue Perfusion: Goal: Adequacy of tissue perfusion will improve Outcome: Not Progressing   Problem: Role Relationship: Goal: Method of communication will improve Outcome: Not Progressing   Problem: Elimination: Goal: Will not experience complications related to bowel motility Outcome: Not Progressing   Problem: Elimination: Goal: Will not experience complications related to urinary retention Outcome: Not Progressing

## 2023-12-22 NOTE — Progress Notes (Signed)
 [facility]                                                                               Meredith Park, is a 53 y.o. female, DOB - 03/19/1971, WUJ:[id number] Admit date - 11/27/2023    Outpatient Primary MD for the patient is Fulbright, [place]  E, PA-C  LOS - 25  days    Brief summary   53 year old female with history of alcohol abuse, HLD, seizures, hypertension, abstinence from alcohol for last 1 year, found down beside her bed by her family members, was noted to be shaking and unresponsive.  EMS was called and was found to be in generalized tonic-clonic seizures, treated with Versed  with cessation of seizures, remained minimally responsive in the ED.  Hypoxemic requiring 10 L O2 and was intubated to protect airway and admitted to the ICU. Significant events as below. 3/25 admit to ICU.  Hypoxemic and unresponsive.  Intubated 3/26 LP performed. Increasing pressor requirement 3/27 Increased vasopressor requirement with worsening MOD including AKI, shock liver. Echo with stress CM. 3/29 Improved vasopressor requirements 4/2 central line d/c, extubated (had cuff leak), reintubated for stridor & secretions several hours later 4/5 CTH w/progression of unihemispheric edema, thought more c/w seizure changes 4/6 LTM, steroids, 4/7 extubated, intermittently on predex for agitation, LTM d/c'd, tmax 101.6/ WBC stable 4/10 on room air.  Transferred to medical floor.  Core track feeding. 4/11 full of secretions unable to clear , requiring non- rebreather, transferred back to ICU 4/14: Transferred out of ICU, TRH   Significant studies: Brain MRI 3/28 with extensive cortical restricted diffusion in the right cerebral hemisphere Brain MRI 4/6 with diffuse cortical edema and increased vascularity to the right hemisphere Spinal tap, no evidence of infection. EEG, postictal.   4/17 patient working with SLP making progress. PEG placement requested. Plan for repeat MRI brain today.   4/18 MRI reviewed. PEG placement next week.   Assessment & Plan    Assessment and Plan:  Status epilepticus with history of seizure disorder Acute encephalopathy secondary to status epilepticus Right cerebral hemisphere edema consistent with postictal state She continues to have left-sided weakness Neurology on board suspect this is postsurgical versus potential autoimmune/paraneoplastic/vasculitis. CT of the chest abdomen and pelvis without any signs of malignancy Neurology recommends to continue with Topamax  100 mg daily, lamotrigine  250 mg BID and DC Vimpat  Prednisone  taper by 10 mg every week. Repeat MRI brain without contrast ordered, showed Recent punctate infarcts in the left temporal subcortical white matter and superior right cerebellum. Evolutionary changes throughout the right cerebral and left cerebellar hemispheres as described above most compatible with evolving injury from recent status epilepticus. She is also on aspirin  81 mg daily and lipitor 40 mg daily for secondary stroke prevention.     Acute hypoxic respiratory failure secondary to aspiration versus volume overload -Patient was initially intubated for status epilepticus and airway protection, subsequently reintubated due to aspiration versus volume overload, has been extubated - On 4/11, was transferred back to ICU for increasing hypoxia, placed on high flow O2 via [place], placed on diuresis, received Lasix  last 40 mg IV x 1 on 4/12 - O2 sats 100% on room air  -  Continue PT OT, I-S, SLP - completed course of antibiotics.  - repeat CXR wnl.    Dysphagia/aspiration Patient on core track, failed SLP evaluation Palliative medicine consulted for goals of care/PEG tubes. Continue with full scope treatments with aggressive life prolonging interventions including PEG.  PEG requested from IR. Scheduled for next week.    New onset cardiomyopathy, elevated troponin likely due to demand ischemia Hypertension - 2D echo  11/28/2023 showed EF of 40 to 45%, G1 DD, regional WMA -Seen by cardiology, recommending conservative management - BP improving, GDMT limited by hypotension, dysphagia -Will start Coreg  3.125 mg twice daily - start the losartan  25 mg daily.     Acute Kidney injury secondary to rhabdomyolysis, ATN, metabolic acidosis Creatinine is back to baseline,  Bicarb low. Will add sodium bicarbonate . Bicarb improved to 17.     Hypernatremia.  Resolved.    Anemia of acute illness Monitor. Hemoglobin around 11.    Acute transaminitis Improving. Recheck in one week.    Hyperglycemia probably secondary to steroids and tube feeds Continue with sliding scale insulin . CBG (last 3)  Recent Labs    12/22/23 0742 12/22/23 1235 12/22/23 1540  GLUCAP 119* 177* 194*     Leukocytosis from steroids.  Monitor.    RN Pressure Injury Documentation:    Malnutrition Type:  Nutrition Problem: Severe Malnutrition Etiology: social / environmental circumstances   Malnutrition Characteristics:  Signs/Symptoms: severe muscle depletion, percent weight loss   Nutrition Interventions:  Interventions: Prostat, Tube feeding, MVI  Estimated body mass index is 26.12 kg/m as calculated from the following:   Height as of this encounter: 5' 2.01" (1.575 m).   Weight as of this encounter: 64.8 kg.  Code Status: full code.  DVT Prophylaxis:  heparin  injection 5,000 Units Start: 12/04/23 0930 SCDs Start: 11/27/23 2310   Level of Care: Level of care: Progressive Family Communication: none at bedside.   Disposition Plan:     Remains inpatient appropriate:  pending clinical improvement, possible SNF placement once PEG is placed.   Procedures:  MRI BRAIN without contrast  Consultants:   Neurology.   Antimicrobials:   Anti-infectives (From admission, onward)    Start     Dose/Rate Route Frequency Ordered Stop   12/14/23 1245  Ampicillin -Sulbactam (UNASYN ) 3 g in sodium chloride  0.9 % 100  mL IVPB        3 g 200 mL/hr over 30 Minutes Intravenous Every 6 hours 12/14/23 1154 12/19/23 0640   12/04/23 0930  piperacillin -tazobactam (ZOSYN ) IVPB 3.375 g  Status:  Discontinued        3.375 g 12.5 mL/hr over 240 Minutes Intravenous Every 8 hours 12/04/23 0839 12/10/23 1048   11/30/23 1000  cefTRIAXone  (ROCEPHIN ) 2 g in sodium chloride  0.9 % 100 mL IVPB  Status:  Discontinued        2 g 200 mL/hr over 30 Minutes Intravenous Every 24 hours 11/29/23 1006 12/04/23 0830   11/29/23 1000  vancomycin  (VANCOREADY) IVPB 750 mg/150 mL  Status:  Discontinued        750 mg 150 mL/hr over 60 Minutes Intravenous Every 24 hours 11/28/23 0245 11/29/23 1006   11/28/23 2200  acyclovir  (ZOVIRAX ) 630 mg in dextrose  5 % 100 mL IVPB  Status:  Discontinued        10 mg/kg  63 kg 112.6 mL/hr over 60 Minutes Intravenous Every 24 hours 11/28/23 0247 11/29/23 0836   11/28/23 1000  ampicillin  (OMNIPEN) 2 g in sodium chloride  0.9 % 100 mL IVPB  Status:  Discontinued        2 g 300 mL/hr over 20 Minutes Intravenous Every 8 hours 11/28/23 0249 11/29/23 1006   11/28/23 0115  vancomycin  (VANCOREADY) IVPB 2000 mg/400 mL        2,000 mg 200 mL/hr over 120 Minutes Intravenous  Once 11/28/23 0028 11/28/23 0308   11/28/23 0115  cefTRIAXone  (ROCEPHIN ) 2 g in sodium chloride  0.9 % 100 mL IVPB  Status:  Discontinued        2 g 200 mL/hr over 30 Minutes Intravenous Every 12 hours 11/28/23 0028 11/29/23 1006   11/28/23 0115  ampicillin  (OMNIPEN) 2 g in sodium chloride  0.9 % 100 mL IVPB        2 g 300 mL/hr over 20 Minutes Intravenous  Once 11/28/23 0028 11/28/23 0129   11/28/23 0100  acyclovir  (ZOVIRAX ) 700 mg in dextrose  5 % 100 mL IVPB        700 mg 114 mL/hr over 60 Minutes Intravenous  Once 11/28/23 0029 11/28/23 0301        Medications  Scheduled Meds:  acetaminophen  (TYLENOL ) oral liquid 160 mg/5 mL  650 mg Per Tube Q6H   [START ON 12/27/2023] aspirin   81 mg Per Tube Daily   atorvastatin   40 mg Per Tube  Daily   carvedilol   3.125 mg Per Tube BID WC   Chlorhexidine  Gluconate Cloth  6 each Topical Daily   feeding supplement (PROSource TF20)  60 mL Per Tube BID   fiber supplement (BANATROL TF)  60 mL Per Tube BID   folic acid   1 mg Per Tube Daily   free water   200 mL Per Tube Q3H   heparin  injection (subcutaneous)  5,000 Units Subcutaneous Q8H   insulin  aspart  0-9 Units Subcutaneous Q4H   lamoTRIgine   250 mg Per Tube BID   multivitamin with minerals  1 tablet Per Tube Daily   nystatin  cream   Topical BID   mouth rinse  15 mL Mouth Rinse 4 times per day   pantoprazole  (PROTONIX ) IV  40 mg Intravenous QHS   predniSONE   50 mg Oral Q breakfast   Followed by   Cecily Cohen ON 12/25/2023] predniSONE   40 mg Oral Q breakfast   Followed by   Cecily Cohen ON 01/01/2024] predniSONE   30 mg Oral Q breakfast   Followed by   Cecily Cohen ON 01/08/2024] predniSONE   20 mg Oral Q breakfast   Followed by   Cecily Cohen ON 01/15/2024] predniSONE   10 mg Oral Q breakfast   QUEtiapine   50 mg Per Tube QHS   sodium bicarbonate   650 mg Per Tube BID   thiamine   100 mg Per Tube Daily   topiramate   100 mg Per Tube QHS   Continuous Infusions:  feeding supplement (OSMOLITE 1.5 CAL) 1,000 mL (12/22/23 1447)   PRN Meds:.docusate sodium , hydrALAZINE , hydrocortisone  cream, ipratropium-albuterol , LORazepam , mouth rinse, polyethylene glycol    Subjective:   Meredith Park was seen and examined today.  No new events overnight.   Objective:   Vitals:   12/22/23 0412 12/22/23 0736 12/22/23 1239 12/22/23 1538  BP: 124/77 (!) 130/90 114/87 114/81  Pulse: 80 92 97 98  Resp: 18 19 18 18   Temp: 97.6 F (36.4 C) 98.2 F (36.8 C) 98.4 F (36.9 C) 98.8 F (37.1 C)  TempSrc: Oral Oral Oral Oral  SpO2: 100% 99% 100% 99%  Weight: 64.8 kg     Height:        Intake/Output Summary (Last 24 hours) at 12/22/2023  7846 Last data filed at 12/22/2023 1555 Gross per 24 hour  Intake 560 ml  Output 2575 ml  Net -2015 ml   Filed Weights   12/20/23  0500 12/21/23 0600 12/22/23 0412  Weight: 64 kg 63.6 kg 64.8 kg     Exam General exam: ill appearing lady, s/p cortrak, not in distress.  Respiratory system: diminished air entry at bases. On RA.  Cardiovascular system: S1 & S2 heard, RRR. No JVD, Gastrointestinal system: Abdomen is nondistended, soft and nontender.  Central nervous system: Arousable, not in distress.  Extremities: no pedal edema.  Skin: No rashes,  Psychiatry: no agitated.     Data Reviewed:  I have personally reviewed following labs and imaging studies   CBC Lab Results  Component Value Date   WBC 16.0 (H) 12/22/2023   RBC 3.38 (L) 12/22/2023   HGB 9.8 (L) 12/22/2023   HCT 29.8 (L) 12/22/2023   MCV 88.2 12/22/2023   MCH 29.0 12/22/2023   PLT 229 12/22/2023   MCHC 32.9 12/22/2023   RDW 17.1 (H) 12/22/2023   LYMPHSABS 1.2 12/22/2023   MONOABS 0.4 12/22/2023   EOSABS 0.0 12/22/2023   BASOSABS 0.0 12/22/2023     Last metabolic panel Lab Results  Component Value Date   NA 138 12/22/2023   K 3.8 12/22/2023   CL 113 (H) 12/22/2023   CO2 17 (L) 12/22/2023   BUN 36 (H) 12/22/2023   CREATININE 0.73 12/22/2023   GLUCOSE 160 (H) 12/22/2023   GFRNONAA >60 12/22/2023   GFRAA >60 11/10/2018   CALCIUM  8.5 (L) 12/22/2023   PHOS 4.0 12/12/2023   PROT 5.0 (L) 12/19/2023   ALBUMIN  2.5 (L) 12/19/2023   BILITOT 0.4 12/19/2023   ALKPHOS 57 12/19/2023   AST 78 (H) 12/19/2023   ALT 48 (H) 12/19/2023   ANIONGAP 8 12/22/2023    CBG (last 3)  Recent Labs    12/22/23 0742 12/22/23 1235 12/22/23 1540  GLUCAP 119* 177* 194*      Coagulation Profile: No results for input(s): "INR", "PROTIME" in the last 168 hours.   Radiology Studies: No results found.      Feliciana Horn M.D. [facility] 12/22/2023, 4:32 PM  Available via Epic secure chat 7am-7pm After 7 pm, please refer to night coverage provider listed on amion.

## 2023-12-23 DIAGNOSIS — E43 Unspecified severe protein-calorie malnutrition: Secondary | ICD-10-CM | POA: Diagnosis not present

## 2023-12-23 DIAGNOSIS — K7200 Acute and subacute hepatic failure without coma: Secondary | ICD-10-CM | POA: Diagnosis not present

## 2023-12-23 DIAGNOSIS — I214 Non-ST elevation (NSTEMI) myocardial infarction: Secondary | ICD-10-CM | POA: Diagnosis not present

## 2023-12-23 DIAGNOSIS — G40901 Epilepsy, unspecified, not intractable, with status epilepticus: Secondary | ICD-10-CM | POA: Diagnosis not present

## 2023-12-23 LAB — GLUCOSE, CAPILLARY
Glucose-Capillary: 108 mg/dL — ABNORMAL HIGH (ref 70–99)
Glucose-Capillary: 111 mg/dL — ABNORMAL HIGH (ref 70–99)
Glucose-Capillary: 147 mg/dL — ABNORMAL HIGH (ref 70–99)
Glucose-Capillary: 166 mg/dL — ABNORMAL HIGH (ref 70–99)
Glucose-Capillary: 188 mg/dL — ABNORMAL HIGH (ref 70–99)
Glucose-Capillary: 99 mg/dL (ref 70–99)

## 2023-12-23 MED ORDER — DEXTROSE IN LACTATED RINGERS 5 % IV SOLN: INTRAVENOUS | Status: AC

## 2023-12-23 NOTE — Progress Notes (Signed)
 [facility]                                                                               Meredith Park, is a 53 y.o. female, DOB - [birth year], ION:[id number] Admit date - 11/27/2023    Outpatient Primary MD for the patient is Fulbright, [place]  E, PA-C  LOS - 26  days    Brief summary   53 year old female with history of alcohol abuse, HLD, seizures, hypertension, abstinence from alcohol for last 1 year, found down beside her bed by her family members, was noted to be shaking and unresponsive.  EMS was called and was found to be in generalized tonic-clonic seizures, treated with Versed  with cessation of seizures, remained minimally responsive in the ED.  Hypoxemic requiring 10 L O2 and was intubated to protect airway and admitted to the ICU. Significant events as below. 3/25 admit to ICU.  Hypoxemic and unresponsive.  Intubated 3/26 LP performed. Increasing pressor requirement 3/27 Increased vasopressor requirement with worsening MOD including AKI, shock liver. Echo with stress CM. 3/29 Improved vasopressor requirements 4/2 central line d/c, extubated (had cuff leak), reintubated for stridor & secretions several hours later 4/5 CTH w/progression of unihemispheric edema, thought more c/w seizure changes 4/6 LTM, steroids, 4/7 extubated, intermittently on predex for agitation, LTM d/c'd, tmax 101.6/ WBC stable 4/10 on room air.  Transferred to medical floor.  Core track feeding. 4/11 full of secretions unable to clear , requiring non- rebreather, transferred back to ICU 4/14: Transferred out of ICU, TRH   Significant studies: Brain MRI 3/28 with extensive cortical restricted diffusion in the right cerebral hemisphere Brain MRI 4/6 with diffuse cortical edema and increased vascularity to the right hemisphere Spinal tap, no evidence of infection. EEG, postictal.   4/17 patient working with SLP making progress. PEG placement requested. Plan for repeat MRI brain today.   4/18 MRI reviewed. PEG placement tomorrow.   Assessment & Plan    Assessment and Plan:  Status epilepticus with history of seizure disorder Acute encephalopathy secondary to status epilepticus Right cerebral hemisphere edema consistent with postictal state She continues to have left-sided weakness Neurology on board suspect this is postsurgical versus potential autoimmune/paraneoplastic/vasculitis. CT of the chest abdomen and pelvis without any signs of malignancy Neurology recommends to continue with Topamax  100 mg daily, lamotrigine  250 mg BID and DC Vimpat  Prednisone  taper by 10 mg every week. Repeat MRI brain without contrast ordered, showed Recent punctate infarcts in the left temporal subcortical white matter and superior right cerebellum. Evolutionary changes throughout the right cerebral and left cerebellar hemispheres as described above most compatible with evolving injury from recent status epilepticus. She is also on aspirin  81 mg daily and lipitor 40 mg daily for secondary stroke prevention.     Acute hypoxic respiratory failure secondary to aspiration versus volume overload -Patient was initially intubated for status epilepticus and airway protection, subsequently reintubated due to aspiration versus volume overload, has been extubated - On 4/11, was transferred back to ICU for increasing hypoxia, placed on high flow O2 via [place], placed on diuresis, received Lasix  last 40 mg IV x 1 on 4/12 - O2 sats 100% on room air  -  Continue PT OT, I-S, SLP - completed course of antibiotics.  - repeat CXR wnl.    Dysphagia/aspiration Patient on core track, failed SLP evaluation Palliative medicine consulted for goals of care/PEG tubes. Continue with full scope treatments with aggressive life prolonging interventions including PEG.  PEG requested from IR. Scheduled for tomorrow.    New onset cardiomyopathy, elevated troponin likely due to demand ischemia Hypertension - 2D echo  11/28/2023 showed EF of 40 to 45%, G1 DD, regional WMA -Seen by cardiology, recommending conservative management - BP improving, GDMT limited by hypotension, dysphagia -Will start Coreg  3.125 mg twice daily - start the losartan  25 mg daily.  - BP parameters are optimal on coreg  and losartan .   Acute Kidney injury secondary to rhabdomyolysis, ATN, metabolic acidosis Creatinine is back to baseline,  Bicarb low. Will add sodium bicarbonate . Bicarb improved to 17.  Re[eat levels in am.    Hypernatremia.  Resolved.    Anemia of acute illness Monitor. Hemoglobin around 11.    Acute transaminitis Improving. Recheck in one week.    Hyperglycemia probably secondary to steroids and tube feeds Continue with sliding scale insulin . CBG (last 3)  Recent Labs    12/23/23 0751 12/23/23 1120 12/23/23 1619  GLUCAP 111* 147* 188*   No changes in  meds.    Leukocytosis from steroids.  Monitor.    RN Pressure Injury Documentation:    Malnutrition Type:  Nutrition Problem: Severe Malnutrition Etiology: social / environmental circumstances   Malnutrition Characteristics:  Signs/Symptoms: severe muscle depletion, percent weight loss   Nutrition Interventions:  Interventions: Prostat, Tube feeding, MVI  Estimated body mass index is 25.07 kg/m as calculated from the following:   Height as of this encounter: 5' 2.01" (1.575 m).   Weight as of this encounter: 62.2 kg.  Code Status: full code.  DVT Prophylaxis:  heparin  injection 5,000 Units Start: 12/04/23 0930 SCDs Start: 11/27/23 2310   Level of Care: Level of care: Progressive Family Communication: none at bedside.   Disposition Plan:     Remains inpatient appropriate:  pending clinical improvement, possible SNF placement once PEG is placed.   Procedures:  MRI BRAIN without contrast  Consultants:   Neurology.   Antimicrobials:   Anti-infectives (From admission, onward)    Start     Dose/Rate Route Frequency  Ordered Stop   12/14/23 1245  Ampicillin -Sulbactam (UNASYN ) 3 g in sodium chloride  0.9 % 100 mL IVPB        3 g 200 mL/hr over 30 Minutes Intravenous Every 6 hours 12/14/23 1154 12/19/23 0640   12/04/23 0930  piperacillin -tazobactam (ZOSYN ) IVPB 3.375 g  Status:  Discontinued        3.375 g 12.5 mL/hr over 240 Minutes Intravenous Every 8 hours 12/04/23 0839 12/10/23 1048   11/30/23 1000  cefTRIAXone  (ROCEPHIN ) 2 g in sodium chloride  0.9 % 100 mL IVPB  Status:  Discontinued        2 g 200 mL/hr over 30 Minutes Intravenous Every 24 hours 11/29/23 1006 12/04/23 0830   11/29/23 1000  vancomycin  (VANCOREADY) IVPB 750 mg/150 mL  Status:  Discontinued        750 mg 150 mL/hr over 60 Minutes Intravenous Every 24 hours 11/28/23 0245 11/29/23 1006   11/28/23 2200  acyclovir  (ZOVIRAX ) 630 mg in dextrose  5 % 100 mL IVPB  Status:  Discontinued        10 mg/kg  63 kg 112.6 mL/hr over 60 Minutes Intravenous Every 24 hours 11/28/23 0247 11/29/23 0836  11/28/23 1000  ampicillin  (OMNIPEN) 2 g in sodium chloride  0.9 % 100 mL IVPB  Status:  Discontinued        2 g 300 mL/hr over 20 Minutes Intravenous Every 8 hours 11/28/23 0249 11/29/23 1006   11/28/23 0115  vancomycin  (VANCOREADY) IVPB 2000 mg/400 mL        2,000 mg 200 mL/hr over 120 Minutes Intravenous  Once 11/28/23 0028 11/28/23 0308   11/28/23 0115  cefTRIAXone  (ROCEPHIN ) 2 g in sodium chloride  0.9 % 100 mL IVPB  Status:  Discontinued        2 g 200 mL/hr over 30 Minutes Intravenous Every 12 hours 11/28/23 0028 11/29/23 1006   11/28/23 0115  ampicillin  (OMNIPEN) 2 g in sodium chloride  0.9 % 100 mL IVPB        2 g 300 mL/hr over 20 Minutes Intravenous  Once 11/28/23 0028 11/28/23 0129   11/28/23 0100  acyclovir  (ZOVIRAX ) 700 mg in dextrose  5 % 100 mL IVPB        700 mg 114 mL/hr over 60 Minutes Intravenous  Once 11/28/23 0029 11/28/23 0301        Medications  Scheduled Meds:  acetaminophen  (TYLENOL ) oral liquid 160 mg/5 mL  650 mg Per Tube  Q6H   [START ON 12/27/2023] aspirin   81 mg Per Tube Daily   atorvastatin   40 mg Per Tube Daily   carvedilol   3.125 mg Per Tube BID WC   Chlorhexidine  Gluconate Cloth  6 each Topical Daily   feeding supplement (PROSource TF20)  60 mL Per Tube BID   fiber supplement (BANATROL TF)  60 mL Per Tube BID   folic acid   1 mg Per Tube Daily   free water   200 mL Per Tube Q3H   heparin  injection (subcutaneous)  5,000 Units Subcutaneous Q8H   insulin  aspart  0-9 Units Subcutaneous Q4H   lamoTRIgine   250 mg Per Tube BID   losartan   25 mg Oral Daily   multivitamin with minerals  1 tablet Per Tube Daily   nystatin  cream   Topical BID   mouth rinse  15 mL Mouth Rinse 4 times per day   pantoprazole  (PROTONIX ) IV  40 mg Intravenous QHS   predniSONE   50 mg Oral Q breakfast   Followed by   Cecily Cohen ON 12/25/2023] predniSONE   40 mg Oral Q breakfast   Followed by   Cecily Cohen ON 01/01/2024] predniSONE   30 mg Oral Q breakfast   Followed by   Cecily Cohen ON 01/08/2024] predniSONE   20 mg Oral Q breakfast   Followed by   Cecily Cohen ON 01/15/2024] predniSONE   10 mg Oral Q breakfast   QUEtiapine   50 mg Per Tube QHS   sodium bicarbonate   650 mg Per Tube BID   thiamine   100 mg Per Tube Daily   topiramate   100 mg Per Tube QHS   Continuous Infusions:  feeding supplement (OSMOLITE 1.5 CAL) Stopped (12/23/23 1113)   PRN Meds:.docusate sodium , hydrALAZINE , hydrocortisone  cream, ipratropium-albuterol , LORazepam , mouth rinse, polyethylene glycol    Subjective:   Meredith Park was seen and examined today.  No new events.   Objective:   Vitals:   12/23/23 0500 12/23/23 0751 12/23/23 1120 12/23/23 1619  BP:  124/85 105/72 112/85  Pulse:  85 95 97  Resp:  17 18 18   Temp:  98.1 F (36.7 C) 97.6 F (36.4 C) 97.8 F (36.6 C)  TempSrc:  Oral Oral Oral  SpO2:  100% 98% 99%  Weight: 62.2 kg  Height:        Intake/Output Summary (Last 24 hours) at 12/23/2023 1810 Last data filed at 12/23/2023 1123 Gross per 24 hour  Intake  200 ml  Output 3050 ml  Net -2850 ml   Filed Weights   12/21/23 0600 12/22/23 0412 12/23/23 0500  Weight: 63.6 kg 64.8 kg 62.2 kg     Exam General exam: ill appearing lady not in distress. On Cortrak Respiratory system: Clear to auscultation. Respiratory effort normal. Cardiovascular system: S1 & S2 heard, RRR. No JVD,  Gastrointestinal system: Abdomen is nondistended, soft and nontender.  Central nervous system: Alert and oriented.  Extremities:  no pedal edema.  Skin: No rashes Psychiatry: Mood & affect appropriate.     Data Reviewed:  I have personally reviewed following labs and imaging studies   CBC Lab Results  Component Value Date   WBC 16.0 (H) 12/22/2023   RBC 3.38 (L) 12/22/2023   HGB 9.8 (L) 12/22/2023   HCT 29.8 (L) 12/22/2023   MCV 88.2 12/22/2023   MCH 29.0 12/22/2023   PLT 229 12/22/2023   MCHC 32.9 12/22/2023   RDW 17.1 (H) 12/22/2023   LYMPHSABS 1.2 12/22/2023   MONOABS 0.4 12/22/2023   EOSABS 0.0 12/22/2023   BASOSABS 0.0 12/22/2023     Last metabolic panel Lab Results  Component Value Date   NA 138 12/22/2023   K 3.8 12/22/2023   CL 113 (H) 12/22/2023   CO2 17 (L) 12/22/2023   BUN 36 (H) 12/22/2023   CREATININE 0.73 12/22/2023   GLUCOSE 160 (H) 12/22/2023   GFRNONAA >60 12/22/2023   GFRAA >60 11/10/2018   CALCIUM  8.5 (L) 12/22/2023   PHOS 4.0 12/12/2023   PROT 5.0 (L) 12/19/2023   ALBUMIN  2.5 (L) 12/19/2023   BILITOT 0.4 12/19/2023   ALKPHOS 57 12/19/2023   AST 78 (H) 12/19/2023   ALT 48 (H) 12/19/2023   ANIONGAP 8 12/22/2023    CBG (last 3)  Recent Labs    12/23/23 0751 12/23/23 1120 12/23/23 1619  GLUCAP 111* 147* 188*      Coagulation Profile: No results for input(s): "INR", "PROTIME" in the last 168 hours.   Radiology Studies: No results found.      Feliciana Horn M.D. [facility] 12/23/2023, 6:10 PM  Available via Epic secure chat 7am-7pm After 7 pm, please refer to night coverage provider listed  on amion.

## 2023-12-23 NOTE — Plan of Care (Signed)
  Problem: Clinical Measurements: Goal: Will remain free from infection Outcome: Progressing   Problem: Clinical Measurements: Goal: Diagnostic test results will improve Outcome: Progressing   Problem: Health Behavior/Discharge Planning: Goal: Ability to manage health-related needs will improve Outcome: Progressing   Problem: Clinical Measurements: Goal: Respiratory complications will improve Outcome: Progressing   Problem: Nutrition: Goal: Adequate nutrition will be maintained Outcome: Progressing   Problem: Coping: Goal: Level of anxiety will decrease Outcome: Progressing   Problem: Elimination: Goal: Will not experience complications related to bowel motility Outcome: Progressing   Problem: Safety: Goal: Ability to remain free from injury will improve Outcome: Progressing   Problem: Skin Integrity: Goal: Risk for impaired skin integrity will decrease Outcome: Progressing   Problem: Safety: Goal: Non-violent Restraint(s) Outcome: Progressing   Problem: Ischemic Stroke/TIA Tissue Perfusion: Goal: Complications of ischemic stroke/TIA will be minimized Outcome: Progressing   Problem: Self-Care: Goal: Ability to participate in self-care as condition permits will improve Outcome: Progressing   Problem: Nutrition: Goal: Risk of aspiration will decrease Outcome: Progressing

## 2023-12-24 ENCOUNTER — Inpatient Hospital Stay (HOSPITAL_COMMUNITY)

## 2023-12-24 ENCOUNTER — Encounter (HOSPITAL_COMMUNITY): Payer: Self-pay | Admitting: General Surgery

## 2023-12-24 DIAGNOSIS — G40901 Epilepsy, unspecified, not intractable, with status epilepticus: Secondary | ICD-10-CM | POA: Diagnosis not present

## 2023-12-24 DIAGNOSIS — E43 Unspecified severe protein-calorie malnutrition: Secondary | ICD-10-CM | POA: Diagnosis not present

## 2023-12-24 DIAGNOSIS — K7200 Acute and subacute hepatic failure without coma: Secondary | ICD-10-CM | POA: Diagnosis not present

## 2023-12-24 DIAGNOSIS — I214 Non-ST elevation (NSTEMI) myocardial infarction: Secondary | ICD-10-CM | POA: Diagnosis not present

## 2023-12-24 HISTORY — PX: IR GASTROSTOMY TUBE MOD SED: IMG625

## 2023-12-24 LAB — GLUCOSE, CAPILLARY
Glucose-Capillary: 90 mg/dL (ref 70–99)
Glucose-Capillary: 77 mg/dL (ref 70–99)
Glucose-Capillary: 107 mg/dL — ABNORMAL HIGH (ref 70–99)
Glucose-Capillary: 82 mg/dL (ref 70–99)
Glucose-Capillary: 100 mg/dL — ABNORMAL HIGH (ref 70–99)
Glucose-Capillary: 96 mg/dL (ref 70–99)

## 2023-12-24 MED ORDER — MIDAZOLAM HCL 2 MG/2ML IJ SOLN
INTRAMUSCULAR | Status: AC | PRN
  Administered 2023-12-24: 1 mg via INTRAVENOUS

## 2023-12-24 MED ORDER — FENTANYL CITRATE (PF) 100 MCG/2ML IJ SOLN
INTRAMUSCULAR | Status: AC | PRN
  Administered 2023-12-24: 50 ug via INTRAVENOUS

## 2023-12-24 MED ORDER — LACTATED RINGERS IV BOLUS
1000.0000 mL | Freq: Once | INTRAVENOUS | Status: AC
  Administered 2023-12-24: 1000 mL via INTRAVENOUS

## 2023-12-24 MED ORDER — GLUCAGON HCL RDNA (DIAGNOSTIC) 1 MG IJ SOLR
INTRAMUSCULAR | Status: AC | PRN
  Administered 2023-12-24: .5 mg via INTRAVENOUS

## 2023-12-24 MED ORDER — LIDOCAINE HCL 1 % IJ SOLN
INTRAMUSCULAR | Status: AC
  Filled 2023-12-24: qty 20

## 2023-12-24 MED ORDER — IOHEXOL 300 MG/ML  SOLN: 50.0000 mL | Freq: Once | INTRAMUSCULAR | Status: DC | PRN

## 2023-12-24 MED ORDER — FENTANYL CITRATE (PF) 100 MCG/2ML IJ SOLN
INTRAMUSCULAR | Status: AC
  Filled 2023-12-24: qty 4

## 2023-12-24 MED ORDER — CEFAZOLIN SODIUM-DEXTROSE 2-4 GM/100ML-% IV SOLN
INTRAVENOUS | Status: AC
Start: 2023-12-24 — End: ?
  Filled 2023-12-24: qty 100

## 2023-12-24 MED ORDER — GLUCAGON HCL RDNA (DIAGNOSTIC) 1 MG IJ SOLR
INTRAMUSCULAR | Status: AC
  Filled 2023-12-24: qty 1

## 2023-12-24 MED ORDER — PANTOPRAZOLE SODIUM 40 MG PO TBEC: 40.0000 mg | DELAYED_RELEASE_TABLET | Freq: Every day | ORAL | Status: DC

## 2023-12-24 MED ORDER — SERTRALINE HCL 50 MG PO TABS
25.0000 mg | ORAL_TABLET | Freq: Every day | ORAL | Status: DC
  Administered 2023-12-24 – 2024-01-01 (×9): 25 mg
  Filled 2023-12-24 (×9): qty 1

## 2023-12-24 MED ORDER — CEFAZOLIN SODIUM-DEXTROSE 2-4 GM/100ML-% IV SOLN
INTRAVENOUS | Status: AC | PRN
  Administered 2023-12-24: 2 g via INTRAVENOUS

## 2023-12-24 MED ORDER — MIDAZOLAM HCL 2 MG/2ML IJ SOLN
INTRAMUSCULAR | Status: AC
  Filled 2023-12-24: qty 4

## 2023-12-24 NOTE — Progress Notes (Signed)
Patient back to the unit.

## 2023-12-24 NOTE — Procedures (Signed)
 Cortrak  Person Inserting Tube:  Louetta Roux, RD Tube Type:  Cortrak - 43 inches Tube Size:  10 Tube Location:  Right nare Secured by: Bridle Technique Used to Measure Tube Placement:  Marking at nare/corner of mouth Cortrak Secured At:  78 cm   Cortrak Tube Team Note:  Consult received to place a Cortrak feeding tube.   X-ray is required and has been ordered.  If the tube becomes dislodged please keep the tube and contact the Cortrak team at [email or web address] for replacement.  If after hours and replacement cannot be delayed, place a NG tube and confirm placement with an abdominal x-ray.    Ernestina Headland, MS, RD, LDN Registered Dietitian II Please see AMiON for contact information.

## 2023-12-24 NOTE — Progress Notes (Signed)
 Patient off the unit to IR for PEG placement

## 2023-12-24 NOTE — TOC Progression Note (Signed)
 Transition of Care [facility]) - Progression Note    Patient Details  Name: Meredith Park MRN: [id number] Date of Birth: 1971-03-11  [facility] CM/SW Contact  Jonathan Neighbor, RN Phone Number: 12/24/2023, 1:35 PM  Clinical Narrative:     Pt unable to get PEG today per IR. She will need surgery consult for PEG.  CM spoke to pts mother and she is asking to have CIR evaluate pt. She is also open having patient faxed out for SNF rehab. CM will send her information to SNF through the HUB.  Will await bed offers.  TOC following.  Expected Discharge Plan: Skilled Nursing Facility Barriers to Discharge: Continued Medical Work up, English as a second language teacher, SNF Pending bed offer  Expected Discharge Plan and Services In-house Referral: Clinical Social Work   Post Acute Care Choice: Skilled Nursing Facility Living arrangements for the past 2 months: Single Family Home                                       Social Determinants of Health (SDOH) Interventions SDOH Screenings   Food Insecurity: Low Risk  (07/18/2023)   Received from [facility]  Housing: Low Risk  (07/18/2023)   Received from [facility]  Transportation Needs: No Transportation Needs (07/18/2023)   Received from [facility]  Utilities: Low Risk  (07/18/2023)   Received from [facility]  Tobacco Use: Low Risk  (12/11/2023)  Recent Concern: Tobacco Use - Medium Risk (11/05/2023)   Received from [facility]    Readmission Risk Interventions    12/12/2023    3:50 PM  Readmission Risk Prevention Plan  Transportation Screening Complete  Medication Review (RN Care Manager) Complete  PCP or Specialist appointment within 3-5 days of discharge Complete  HRI or Home Care Consult Complete  SW Recovery Care/Counseling Consult Complete  Palliative Care Screening Not Applicable  Skilled Nursing Facility Complete

## 2023-12-24 NOTE — Plan of Care (Signed)
  Problem: Education: Goal: Knowledge of General Education information will improve Description: Including pain rating scale, medication(s)/side effects and non-pharmacologic comfort measures Outcome: Progressing   Problem: Health Behavior/Discharge Planning: Goal: Ability to manage health-related needs will improve Outcome: Progressing   Problem: Clinical Measurements: Goal: Will remain free from infection Outcome: Progressing   Problem: Clinical Measurements: Goal: Respiratory complications will improve Outcome: Progressing   Problem: Activity: Goal: Risk for activity intolerance will decrease Outcome: Progressing   Problem: Nutrition: Goal: Adequate nutrition will be maintained Outcome: Progressing   Problem: Coping: Goal: Level of anxiety will decrease Outcome: Progressing   Problem: Safety: Goal: Ability to remain free from injury will improve Outcome: Progressing   Problem: Skin Integrity: Goal: Risk for impaired skin integrity will decrease Outcome: Progressing   Problem: Activity: Goal: Ability to tolerate increased activity will improve Outcome: Progressing   Problem: Safety: Goal: Non-violent Restraint(s) Outcome: Progressing   Problem: Ischemic Stroke/TIA Tissue Perfusion: Goal: Complications of ischemic stroke/TIA will be minimized Outcome: Progressing

## 2023-12-24 NOTE — Progress Notes (Signed)
 [facility]                                                                               Meredith Park, is a 53 y.o. female, DOB - 07-04-1971, UJW:[id number] Admit date - 11/27/2023    Outpatient Primary MD for the patient is Park, Meredith  E, PA-C  LOS - 27  days    Brief summary   53 year old female with history of alcohol abuse, HLD, seizures, hypertension, abstinence from alcohol for last 1 year, found down beside her bed by her family members, was noted to be shaking and unresponsive.  EMS was called and was found to be in generalized tonic-clonic seizures, treated with Versed  with cessation of seizures, remained minimally responsive in the ED.  Hypoxemic requiring 10 L O2 and was intubated to protect airway and admitted to the ICU. Significant events as below. 3/25 admit to ICU.  Hypoxemic and unresponsive.  Intubated 3/26 LP performed. Increasing pressor requirement 3/27 Increased vasopressor requirement with worsening MOD including AKI, shock liver. Echo with stress CM. 3/29 Improved vasopressor requirements 4/2 central line d/c, extubated (had cuff leak), reintubated for stridor & secretions several hours later 4/5 CTH w/progression of unihemispheric edema, thought more c/w seizure changes 4/6 LTM, steroids, 4/7 extubated, intermittently on predex for agitation, LTM d/c'd, tmax 101.6/ WBC stable 4/10 on room air.  Transferred to medical floor.  Core track feeding. 4/11 full of secretions unable to clear , requiring non- rebreather, transferred back to ICU 4/14: Transferred out of ICU, TRH   Significant studies: Brain MRI 3/28 with extensive cortical restricted diffusion in the right cerebral hemisphere Brain MRI 4/6 with diffuse cortical edema and increased vascularity to the right hemisphere Spinal tap, no evidence of infection. EEG, postictal.   4/17 patient working with SLP making progress. PEG placement requested. Plan for repeat MRI brain today.   4/18 MRI brain.  4/21 IR unable to place the PEG due to colon / safe window. Gen surgery consulted and plan for PEG placement in am.   Assessment & Plan    Assessment and Plan:  Status epilepticus with history of seizure disorder Acute encephalopathy secondary to status epilepticus Right cerebral hemisphere edema consistent with postictal state She continues to have left-sided weakness Neurology on board suspect this is postsurgical versus potential autoimmune/paraneoplastic/vasculitis. CT of the chest abdomen and pelvis without any signs of malignancy Neurology recommends to continue with Topamax  100 mg daily, lamotrigine  250 mg BID and DC Vimpat  Prednisone  taper by 10 mg every week. Currently she is on prednisone  40 mg starting from 4/22. Repeat MRI brain without contrast ordered, showed Recent punctate infarcts in the left temporal subcortical white matter and superior right cerebellum. Evolutionary changes throughout the right cerebral and left cerebellar hemispheres as described above most compatible with evolving injury from recent status epilepticus. She is also on aspirin  81 mg daily and lipitor 40 mg daily for secondary stroke prevention.     Acute hypoxic respiratory failure secondary to aspiration versus volume overload -Patient was initially intubated for status epilepticus and airway protection, subsequently reintubated due to aspiration versus volume overload, has been extubated - On 4/11, was transferred back to ICU  for increasing hypoxia, placed on high flow O2 via [place], placed on diuresis, received Lasix  last 40 mg IV x 1 on 4/12 - O2 sats 100% on room air  - Continue PT OT, I-S, SLP - completed course of antibiotics.  - repeat CXR wnl.    Dysphagia/aspiration Patient on core track, failed SLP evaluation Palliative medicine consulted for goals of care/PEG tubes. Continue with full scope treatments with aggressive life prolonging interventions including PEG.  PEG  requested from IR.IR unable to place the PEG due to colon / safe window. Gen surgery consulted and plan for PEG placement in am.  Please hold the tube feeds at midnight.    New onset cardiomyopathy, elevated troponin likely due to demand ischemia Hypertension - 2D echo 11/28/2023 showed EF of 40 to 45%, G1 DD, regional WMA -Seen by cardiology, recommending conservative management - BP improving, GDMT limited by hypotension, dysphagia -Will start Coreg  3.125 mg twice daily - start the losartan  25 mg daily.  - BP parameters are optimal on coreg  and losartan .   Acute Kidney injury secondary to rhabdomyolysis, ATN, metabolic acidosis Creatinine is back to baseline,  Bicarb low. Will add sodium bicarbonate . Bicarb improved to 17.  Re[eat levels in am.    Hypernatremia.  Resolved.    Anemia of acute illness Monitor. Hemoglobin around 11.    Acute transaminitis Improving. Recheck in one week.    Hyperglycemia probably secondary to steroids and tube feeds Continue with sliding scale insulin . CBG (last 3)  Recent Labs    12/23/23 2357 12/24/23 0436 12/24/23 0752  GLUCAP 108* 90 77   No changes in  meds.    Leukocytosis from steroids.  Monitor.   Hypotension  From iv fentanyl  and versad Ordered fluid bolus with RL,  BP parameters improved.    RN Pressure Injury Documentation:    Malnutrition Type:  Nutrition Problem: Severe Malnutrition Etiology: social / environmental circumstances   Malnutrition Characteristics:  Signs/Symptoms: severe muscle depletion, percent weight loss   Nutrition Interventions:  Interventions: Prostat, Tube feeding, MVI  Estimated body mass index is 25.03 kg/m as calculated from the following:   Height as of this encounter: 5' 2.01" (1.575 m).   Weight as of this encounter: 62.1 kg.  Code Status: full code.  DVT Prophylaxis:  heparin  injection 5,000 Units Start: 12/04/23 0930 SCDs Start: 11/27/23 2310   Level of Care:  Level of care: Progressive Family Communication: none at bedside.   Disposition Plan:     Remains inpatient appropriate:  pending clinical improvement, possible SNF placement once PEG is placed.   Procedures:  MRI BRAIN without contrast  Consultants:   Neurology.   Antimicrobials:   Anti-infectives (From admission, onward)    Start     Dose/Rate Route Frequency Ordered Stop   12/24/23 0910  ceFAZolin  (ANCEF ) IVPB 2g/100 mL premix        over 30 Minutes Intravenous Continuous PRN 12/24/23 0911     12/14/23 1245  Ampicillin -Sulbactam (UNASYN ) 3 g in sodium chloride  0.9 % 100 mL IVPB        3 g 200 mL/hr over 30 Minutes Intravenous Every 6 hours 12/14/23 1154 12/19/23 0640   12/04/23 0930  piperacillin -tazobactam (ZOSYN ) IVPB 3.375 g  Status:  Discontinued        3.375 g 12.5 mL/hr over 240 Minutes Intravenous Every 8 hours 12/04/23 0839 12/10/23 1048   11/30/23 1000  cefTRIAXone  (ROCEPHIN ) 2 g in sodium chloride  0.9 % 100 mL IVPB  Status:  Discontinued  2 g 200 mL/hr over 30 Minutes Intravenous Every 24 hours 11/29/23 1006 12/04/23 0830   11/29/23 1000  vancomycin  (VANCOREADY) IVPB 750 mg/150 mL  Status:  Discontinued        750 mg 150 mL/hr over 60 Minutes Intravenous Every 24 hours 11/28/23 0245 11/29/23 1006   11/28/23 2200  acyclovir  (ZOVIRAX ) 630 mg in dextrose  5 % 100 mL IVPB  Status:  Discontinued        10 mg/kg  63 kg 112.6 mL/hr over 60 Minutes Intravenous Every 24 hours 11/28/23 0247 11/29/23 0836   11/28/23 1000  ampicillin  (OMNIPEN) 2 g in sodium chloride  0.9 % 100 mL IVPB  Status:  Discontinued        2 g 300 mL/hr over 20 Minutes Intravenous Every 8 hours 11/28/23 0249 11/29/23 1006   11/28/23 0115  vancomycin  (VANCOREADY) IVPB 2000 mg/400 mL        2,000 mg 200 mL/hr over 120 Minutes Intravenous  Once 11/28/23 0028 11/28/23 0308   11/28/23 0115  cefTRIAXone  (ROCEPHIN ) 2 g in sodium chloride  0.9 % 100 mL IVPB  Status:  Discontinued        2 g 200 mL/hr  over 30 Minutes Intravenous Every 12 hours 11/28/23 0028 11/29/23 1006   11/28/23 0115  ampicillin  (OMNIPEN) 2 g in sodium chloride  0.9 % 100 mL IVPB        2 g 300 mL/hr over 20 Minutes Intravenous  Once 11/28/23 0028 11/28/23 0129   11/28/23 0100  acyclovir  (ZOVIRAX ) 700 mg in dextrose  5 % 100 mL IVPB        700 mg 114 mL/hr over 60 Minutes Intravenous  Once 11/28/23 0029 11/28/23 0301        Medications  Scheduled Meds:  acetaminophen  (TYLENOL ) oral liquid 160 mg/5 mL  650 mg Per Tube Q6H   [START ON 12/27/2023] aspirin   81 mg Per Tube Daily   atorvastatin   40 mg Per Tube Daily   carvedilol   3.125 mg Per Tube BID WC   Chlorhexidine  Gluconate Cloth  6 each Topical Daily   feeding supplement (PROSource TF20)  60 mL Per Tube BID   fiber supplement (BANATROL TF)  60 mL Per Tube BID   folic acid   1 mg Per Tube Daily   free water   200 mL Per Tube Q3H   heparin  injection (subcutaneous)  5,000 Units Subcutaneous Q8H   insulin  aspart  0-9 Units Subcutaneous Q4H   lamoTRIgine   250 mg Per Tube BID   losartan   25 mg Oral Daily   multivitamin with minerals  1 tablet Per Tube Daily   nystatin  cream   Topical BID   mouth rinse  15 mL Mouth Rinse 4 times per day   pantoprazole  (PROTONIX ) IV  40 mg Intravenous QHS   predniSONE   50 mg Oral Q breakfast   Followed by   Cecily Cohen ON 12/25/2023] predniSONE   40 mg Oral Q breakfast   Followed by   Cecily Cohen ON 01/01/2024] predniSONE   30 mg Oral Q breakfast   Followed by   Cecily Cohen ON 01/08/2024] predniSONE   20 mg Oral Q breakfast   Followed by   Cecily Cohen ON 01/15/2024] predniSONE   10 mg Oral Q breakfast   QUEtiapine   50 mg Per Tube QHS   sertraline   25 mg Per Tube QHS   sodium bicarbonate   650 mg Per Tube BID   thiamine   100 mg Per Tube Daily   topiramate   100 mg Per Tube QHS  Continuous Infusions:  ceFAZolin  2 g (12/24/23 0910)   dextrose  5% lactated ringers  40 mL/hr at 12/23/23 1859   feeding supplement (OSMOLITE 1.5 CAL) Stopped (12/24/23 0000)    PRN Meds:.ceFAZolin , docusate sodium , fentaNYL , glucagon  (human recombinant), hydrALAZINE , hydrocortisone  cream, iohexol , ipratropium-albuterol , LORazepam , midazolam , mouth rinse, polyethylene glycol    Subjective:   Meredith Park was seen and examined today.   No new events.   Objective:   Vitals:   12/24/23 0910 12/24/23 0920 12/24/23 0925 12/24/23 0930  BP: 111/77 93/65 94/69  104/68  Pulse: 70 82 84 83  Resp: 12 14 12 10   Temp:      TempSrc:      SpO2: 100% 95% 98% 98%  Weight:      Height:        Intake/Output Summary (Last 24 hours) at 12/24/2023 0933 Last data filed at 12/23/2023 1921 Gross per 24 hour  Intake --  Output 2100 ml  Net -2100 ml   Filed Weights   12/22/23 0412 12/23/23 0500 12/24/23 0500  Weight: 64.8 kg 62.2 kg 62.1 kg     Exam General exam: well developed not in distress.  Respiratory system: Clear to auscultation. Respiratory effort normal. Cardiovascular system: S1 & S2 heard, RRR. No JVD Gastrointestinal system: Abdomen is soft, bs+ Central nervous system: Alert and oriented to self and place. Hypophonic.  Extremities: no pedal edema.  Skin: No rashes,  Psychiatry:  no agitation.      Data Reviewed:  I have personally reviewed following labs and imaging studies   CBC Lab Results  Component Value Date   WBC 16.0 (H) 12/22/2023   RBC 3.38 (L) 12/22/2023   HGB 9.8 (L) 12/22/2023   HCT 29.8 (L) 12/22/2023   MCV 88.2 12/22/2023   MCH 29.0 12/22/2023   PLT 229 12/22/2023   MCHC 32.9 12/22/2023   RDW 17.1 (H) 12/22/2023   LYMPHSABS 1.2 12/22/2023   MONOABS 0.4 12/22/2023   EOSABS 0.0 12/22/2023   BASOSABS 0.0 12/22/2023     Last metabolic panel Lab Results  Component Value Date   NA 138 12/22/2023   K 3.8 12/22/2023   CL 113 (H) 12/22/2023   CO2 17 (L) 12/22/2023   BUN 36 (H) 12/22/2023   CREATININE 0.73 12/22/2023   GLUCOSE 160 (H) 12/22/2023   GFRNONAA >60 12/22/2023   GFRAA >60 11/10/2018   CALCIUM  8.5 (L)  12/22/2023   PHOS 4.0 12/12/2023   PROT 5.0 (L) 12/19/2023   ALBUMIN  2.5 (L) 12/19/2023   BILITOT 0.4 12/19/2023   ALKPHOS 57 12/19/2023   AST 78 (H) 12/19/2023   ALT 48 (H) 12/19/2023   ANIONGAP 8 12/22/2023    CBG (last 3)  Recent Labs    12/23/23 2357 12/24/23 0436 12/24/23 0752  GLUCAP 108* 90 77      Coagulation Profile: No results for input(s): "INR", "PROTIME" in the last 168 hours.   Radiology Studies: No results found.      Feliciana Horn M.D. [facility] 12/24/2023, 9:33 AM  Available via Epic secure chat 7am-7pm After 7 pm, please refer to night coverage provider listed on amion.

## 2023-12-24 NOTE — Procedures (Addendum)
  Procedure:  Gastrostomy placement (attempted) persistent colonic interposition, no safe window, procedure terminated;  recommend consult for  surgical placement Preprocedure diagnosis: The primary encounter diagnosis was Seizure-like activity (HCC). Diagnoses of Altered mental status, unspecified altered mental status type and AKI (acute kidney injury) (HCC) were also pertinent to this visit. Postprocedure diagnosis: same EBL:    minimal Complications:   none immediate  See full dictation in [employer].  Nicky Barrack MD Main # [phone] Pager  [phone] Mobile [phone]

## 2023-12-24 NOTE — Sedation Documentation (Signed)
 No window to safely place g tube. Procedure aborted per Dr. Marlena Sima. SABR sent via secure chat.

## 2023-12-24 NOTE — Consult Note (Addendum)
 Meredith Park 07-02-1971  [id number].    Requesting MD: Dr. Feliciana Horn Chief Complaint/Reason for Consult: dysphagia need for g-tube  HPI:  This is a patient who is here after having status epilepticus and has developed dysphagia.  She has a history of ETOH abuse, withdrawal , HLD, HTN, and seizure DO.  She currently is getting TFs via her Cortrak as she has not passed for a diet.  She is currently awaiting SNF placement and needs permanent feeding access while she is still working on her swallowing.  She denies any abdominal surgery, but she is not 100% oriented, but no scars are seen on her abdomen.  IR was unable to place this tube this morning due to colon interposition.  We have been asked to see her.  ROS: ROS: c/o pain in her right arm, but otherwise no other complaints.  Family History  Problem Relation Age of Onset   Sleep apnea Neg Hx     Past Medical History:  Diagnosis Date   Alcohol abuse    Alcohol withdrawal (HCC) 08/28/2018   High cholesterol    Hypertension    Seizures (HCC)     History reviewed. No pertinent surgical history.  Social History:  reports that she has never smoked. She has never used smokeless tobacco. She reports current alcohol use. She reports that she does not currently use drugs after having used the following drugs: Marijuana.  Allergies:  Allergies  Allergen Reactions   Meperidine Itching   Hydrochlorothiazide Other (See Comments)    Raises ammonia level   Keppra  [Levetiracetam ] Other (See Comments)    Behavioral, suicidal ideation     Medications Prior to Admission  Medication Sig Dispense Refill   atorvastatin  (LIPITOR) 10 MG tablet Take 10 mg by mouth daily.     carvedilol  (COREG ) 12.5 MG tablet Take 1 tablet (12.5 mg total) by mouth 2 (two) times daily. 30 tablet 0   clonazePAM  (KLONOPIN ) 0.5 MG tablet Take 1 tablet (0.5 mg total) by mouth every 8 (eight) hours. 90 tablet 0   ibuprofen  (ADVIL ) 200 MG tablet Take 600  mg by mouth every 6 (six) hours as needed for moderate pain.     lamoTRIgine  (LAMICTAL ) 200 MG tablet Take 200 mg by mouth 2 (two) times daily. Take medication with two (25mg ) tablet by mouth twice daily for a total of 250mg .     lamoTRIgine  (LAMICTAL ) 25 MG tablet Take 50 mg by mouth 2 (two) times daily. Take with one (200mg ) tablet by mouth twice daily for a dose total of 250mg .     losartan  (COZAAR ) 50 MG tablet Take 50 mg by mouth daily. Take only if blood pressure is over 139.     Midazolam  (NAYZILAM ) 5 MG/0.1ML SOLN PLACE 5 MG INTO THE NOSE AS NEEDED. 2 each 0   SUMAtriptan  (IMITREX ) 100 MG tablet Take 1 tablet (100 mg total) by mouth daily as needed for migraine. 10 tablet 0   topiramate  (TOPAMAX ) 100 MG tablet Take 100 mg by mouth daily.     sertraline  (ZOLOFT ) 50 MG tablet Take 50 mg by mouth daily. (Patient not taking: Reported on 11/07/2022)       Physical Exam: Blood pressure 128/73, pulse 86, temperature 98 F (36.7 C), temperature source Axillary, resp. rate 14, height 5' 2.01" (1.575 m), weight 62.1 kg, last menstrual period 08/28/2020, SpO2 98%. General: pleasant, WD, WN white female who is laying in bed in NAD HEENT: head is normocephalic, atraumatic.  Sclera are noninjected.  PERRL.  Ears and nose without any masses or lesions. Cortrak in place.  Heart: regular, rate, and rhythm.  Normal s1,s2. No obvious murmurs, gallops, or rubs noted. Lungs: CTAB, no wheezes, rhonchi, or rales noted.  Respiratory effort nonlabored Abd: soft, NT, ND, +BS, no masses, hernias, or organomegaly Psych: Alert and oriented to hospital, not sure which one, situation as "I'm here for my damn brain", Trump is president, but year is 41.   Results for orders placed or performed during the hospital encounter of 11/27/23 (from the past 48 hours)  Glucose, capillary     Status: Abnormal   Collection Time: 12/22/23  3:40 PM  Result Value Ref Range   Glucose-Capillary 194 (H) 70 - 99 mg/dL    Comment:  Glucose reference range applies only to samples taken after fasting for at least 8 hours.   Comment 1 Notify RN    Comment 2 Document in Chart   Glucose, capillary     Status: Abnormal   Collection Time: 12/22/23  8:00 PM  Result Value Ref Range   Glucose-Capillary 125 (H) 70 - 99 mg/dL    Comment: Glucose reference range applies only to samples taken after fasting for at least 8 hours.   Comment 1 Notify RN   Glucose, capillary     Status: None   Collection Time: 12/22/23 11:27 PM  Result Value Ref Range   Glucose-Capillary 87 70 - 99 mg/dL    Comment: Glucose reference range applies only to samples taken after fasting for at least 8 hours.  Glucose, capillary     Status: None   Collection Time: 12/23/23  3:56 AM  Result Value Ref Range   Glucose-Capillary 99 70 - 99 mg/dL    Comment: Glucose reference range applies only to samples taken after fasting for at least 8 hours.  Glucose, capillary     Status: Abnormal   Collection Time: 12/23/23  7:51 AM  Result Value Ref Range   Glucose-Capillary 111 (H) 70 - 99 mg/dL    Comment: Glucose reference range applies only to samples taken after fasting for at least 8 hours.   Comment 1 Notify RN   Glucose, capillary     Status: Abnormal   Collection Time: 12/23/23 11:20 AM  Result Value Ref Range   Glucose-Capillary 147 (H) 70 - 99 mg/dL    Comment: Glucose reference range applies only to samples taken after fasting for at least 8 hours.   Comment 1 Notify RN   Glucose, capillary     Status: Abnormal   Collection Time: 12/23/23  4:19 PM  Result Value Ref Range   Glucose-Capillary 188 (H) 70 - 99 mg/dL    Comment: Glucose reference range applies only to samples taken after fasting for at least 8 hours.   Comment 1 Notify RN   Glucose, capillary     Status: Abnormal   Collection Time: 12/23/23  8:45 PM  Result Value Ref Range   Glucose-Capillary 166 (H) 70 - 99 mg/dL    Comment: Glucose reference range applies only to samples taken  after fasting for at least 8 hours.  Glucose, capillary     Status: Abnormal   Collection Time: 12/23/23 11:57 PM  Result Value Ref Range   Glucose-Capillary 108 (H) 70 - 99 mg/dL    Comment: Glucose reference range applies only to samples taken after fasting for at least 8 hours.  Glucose, capillary     Status: None  Collection Time: 12/24/23  4:36 AM  Result Value Ref Range   Glucose-Capillary 90 70 - 99 mg/dL    Comment: Glucose reference range applies only to samples taken after fasting for at least 8 hours.  Glucose, capillary     Status: None   Collection Time: 12/24/23  7:52 AM  Result Value Ref Range   Glucose-Capillary 77 70 - 99 mg/dL    Comment: Glucose reference range applies only to samples taken after fasting for at least 8 hours.   Comment 1 Notify RN    Comment 2 Document in Chart   Glucose, capillary     Status: Abnormal   Collection Time: 12/24/23 10:44 AM  Result Value Ref Range   Glucose-Capillary 107 (H) 70 - 99 mg/dL    Comment: Glucose reference range applies only to samples taken after fasting for at least 8 hours.  Glucose, capillary     Status: None   Collection Time: 12/24/23 12:08 PM  Result Value Ref Range   Glucose-Capillary 82 70 - 99 mg/dL    Comment: Glucose reference range applies only to samples taken after fasting for at least 8 hours.   Comment 1 Notify RN    Comment 2 Document in Chart    No results found.    Assessment/Plan Dysphagia, s/p Status Epilepticus The patient has been seen, examined, labs, vitals, and imaging personally reviewed.  She has persistent dysphagia and is still recommended for an NPO status.  She is nearing discharge readiness for her next venue and warrants more long-term, permanent feeding access.  She was unable to get a percutaneous g-tube placed by IR this morning.  We will to attempt a PEG placement in the OR tomorrow.  If we are unable to do this, then we will continue endoscopically and use laparoscopy  assistance in placement.  The patient is partially oriented and nods in agreement with this plan, but is certainly not consentable.  I have spoken to her mother, Kaydense Rizo, to explain the procedure, risks, and possible complications.  She understands and is agreeable to proceed.  Hold TFs after MN.  Plan to do this tomorrow, 4/22 with Dr. Aniceto Barley.   FEN - Cortrak, NPO, hold TFs p MN VTE - ASA, 81mg , heparin  ID - none   I reviewed Consultant IR notes, hospitalist notes, last 24 h vitals and pain scores, last 48 h intake and output, last 24 h labs and trends, and last 24 h imaging results.  Leone Ralphs, [facility] 12/24/2023, 2:40 PM Please see Amion for pager number during day hours 7:00am-4:30pm or 7:00am -11:30am on weekends

## 2023-12-24 NOTE — Progress Notes (Signed)
   12/24/23 1030  Assess: MEWS Score  Temp 98 F (36.7 C)  BP (!) 83/62  MAP (mmHg) 71  Pulse Rate 75  ECG Heart Rate 75  Resp 12  Level of Consciousness Responds to Voice  SpO2 98 %  O2 Device Room Air  Assess: MEWS Score  MEWS Temp 0  MEWS Systolic 1  MEWS Pulse 0  MEWS RR 1  MEWS LOC 1  MEWS Score 3  MEWS Score Color Yellow  Assess: if the MEWS score is Yellow or Red  Were vital signs accurate and taken at a resting state? Yes  Does the patient meet 2 or more of the SIRS criteria? No  MEWS guidelines implemented  Yes, yellow  Treat  MEWS Interventions Considered administering scheduled or prn medications/treatments as ordered  Take Vital Signs  Increase Vital Sign Frequency  Yellow: Q2hr x1, continue Q4hrs until patient remains green for 12hrs  Escalate  MEWS: Escalate Yellow: Discuss with charge nurse and consider notifying provider and/or RRT  Notify: Charge Nurse/RN  Name of Charge Nurse/RN Notified Tracey, RN  Provider Notification  Provider Name/Title Feliciana Horn, MD  Date Provider Notified 12/24/23  Time Provider Notified 1030  Method of Notification  (Secure chat)  Notification Reason Other (Comment) (BP soft)  Provider response See new orders  Date of Provider Response 12/24/23  Time of Provider Response 1031  Assess: SIRS CRITERIA  SIRS Temperature  0  SIRS Respirations  0  SIRS Pulse 0  SIRS WBC 0  SIRS Score Sum  0

## 2023-12-24 NOTE — TOC CAGE-AID Note (Signed)
 Transition of Care [facility]) - CAGE-AID Screening   Patient Details  Name: [name] MRN: [id number] Date of Birth: 1971-06-06  [facility] CM/SW Contact:    Baylei Siebels E Amyri Frenz, LCSW Phone Number: 12/24/2023, 4:08 PM   Clinical Narrative:    CAGE-AID Screening: Substance Abuse Screening unable to be completed due to: : Patient unable to participate

## 2023-12-24 NOTE — NC FL2 (Signed)
 [place]  MEDICAID FL2 LEVEL OF CARE FORM     IDENTIFICATION  Patient Name: Meredith Park Birthdate: 1971/03/25 Sex: female Admission Date (Current Location): 11/27/2023  [facility] and [place] Number:  Producer, television/film/video and Address:  [facility] [place], [address] [place], [place] [address]      Provider Number: [id number]  Attending Physician Name and Address:  Feliciana Horn, MD  Relative Name and Phone Number:  Labresha, Mellor Mother [phone]    Current Level of Care: Hospital Recommended Level of Care: Skilled Nursing Facility Prior Approval Number:    Date Approved/Denied:   PASRR Number: [id number]  Discharge Plan: SNF    Current Diagnoses: Patient Active Problem List   Diagnosis Date Noted   Acute ischemic stroke (HCC) 12/21/2023   Protein-calorie malnutrition, severe 12/07/2023   Altered mental status 12/07/2023   Non-ST elevation (NSTEMI) myocardial infarction (HCC) 11/29/2023   Acute systolic heart failure (HCC) 11/29/2023   AKI (acute kidney injury) (HCC) 11/29/2023   Acute hypoxemic respiratory failure (HCC) 11/29/2023   Shock liver 11/29/2023   Elevated liver enzymes 11/29/2023   Shock (HCC) 11/28/2023   Status epilepticus (HCC) 11/27/2023   Alcohol abuse 11/07/2022   QT prolongation 11/07/2022   Leukocytosis 11/07/2022   Hyponatremia 11/07/2022   Rhabdomyolysis 11/07/2022   High anion gap metabolic acidosis 11/07/2022   Hypokalemia 09/30/2021   Cannabis abuse-Urine drug screen +ve on 1/26 09/30/2021   Breakthrough seizure (HCC) 09/29/2021   Alcohol dependence in remission (HCC) 04/12/2018   Adjustment disorder with mixed anxiety and depressed mood 08/26/2017   Essential (primary) hypertension 08/26/2017   Mixed hyperlipidemia 08/26/2017    Orientation RESPIRATION BLADDER Height & Weight     Self, Place  Normal Indwelling catheter Weight: 62.1 kg Height:  5' 2.01" (157.5 cm)  BEHAVIORAL SYMPTOMS/MOOD  NEUROLOGICAL BOWEL NUTRITION STATUS    Convulsions/Seizures Incontinent Feeding tube (PEG)  AMBULATORY STATUS COMMUNICATION OF NEEDS Skin   Extensive Assist Verbally Bruising, Skin abrasions (Erythema on buttocks/ Bruising to arms and abd/ Rt leg abrasion)                       Personal Care Assistance Level of Assistance  Bathing, Feeding, Dressing Bathing Assistance: Maximum assistance Feeding assistance: Maximum assistance Dressing Assistance: Maximum assistance     Functional Limitations Info  Sight, Hearing, Speech Sight Info: Adequate Hearing Info: Adequate Speech Info: Impaired    SPECIAL CARE FACTORS FREQUENCY  Speech therapy     PT Frequency: 5x/week OT Frequency: 5x/week     Speech Therapy Frequency: 5x/wk      Contractures Contractures Info: Not present    Additional Factors Info  Psychotropic Code Status Info: Full Allergies Info: Meperidine, Hydrochlorothiazide, Keppra  (Levetiracetam ) Psychotropic Info: lamictal  250 mg BID/ seroquel  50 mg bedtime/ Zoloft  25 mg at bedtime/ topamax  100 mg at bedtime Insulin  Sliding Scale Info: see dc summary       Current Medications (12/24/2023):  This is the current hospital active medication list Current Facility-Administered Medications  Medication Dose Route Frequency Provider Last Rate Last Admin   acetaminophen  (TYLENOL ) 160 MG/5ML solution 650 mg  650 mg Per Tube Q6H Rai, Ripudeep K, MD   650 mg at 12/23/23 2222   [START ON 12/27/2023] aspirin  chewable tablet 81 mg  81 mg Per Tube Daily Yadav, Priyanka O, MD       atorvastatin  (LIPITOR) tablet 40 mg  40 mg Per Tube Daily Yadav, Priyanka O, MD   40  mg at 12/23/23 4098   carvedilol  (COREG ) tablet 3.125 mg  3.125 mg Per Tube BID WC Rai, Ripudeep K, MD   3.125 mg at 12/23/23 1712   Chlorhexidine  Gluconate Cloth 2 % PADS 6 each  6 each Topical Daily Vada Garibaldi, MD   6 each at 12/24/23 0830   dextrose  5 % in lactated ringers  infusion   Intravenous Continuous Akula,  Vijaya, MD 40 mL/hr at 12/23/23 1859 New Bag at 12/23/23 1859   docusate sodium  (COLACE) capsule 100 mg  100 mg Oral BID PRN Elonda Hale, MD       feeding supplement (OSMOLITE 1.5 CAL) liquid 1,000 mL  1,000 mL Per Tube Continuous Quillian Brunt, MD   Held at 12/24/23 0000   feeding supplement (PROSource TF20) liquid 60 mL  60 mL Per Tube BID Jaquita Merl P, DO   60 mL at 12/23/23 2222   fiber supplement (BANATROL TF) liquid 60 mL  60 mL Per Tube BID Josiah Nigh, MD   60 mL at 12/23/23 2222   folic acid  (FOLVITE ) tablet 1 mg  1 mg Per Tube Daily Quillian Brunt, MD   1 mg at 12/23/23 1191   free water  200 mL  200 mL Per Tube Q3H Rai, Ripudeep K, MD   200 mL at 12/23/23 2100   heparin  injection 5,000 Units  5,000 Units Subcutaneous Q8H Jaquita Merl P, DO   5,000 Units at 12/23/23 2231   hydrALAZINE  (APRESOLINE ) injection 10 mg  10 mg Intravenous Q4H PRN Paliwal, Zenda Highman, MD   10 mg at 12/11/23 2107   hydrocortisone  cream 1 %   Topical PRN Jaquita Merl P, DO       insulin  aspart (novoLOG ) injection 0-9 Units  0-9 Units Subcutaneous Q4H Simpson, Paula B, NP   2 Units at 12/23/23 2053   iohexol  (OMNIPAQUE ) 300 MG/ML solution 50 mL  50 mL Per Tube Once PRN Marland Silvas, MD       ipratropium-albuterol  (DUONEB) 0.5-2.5 (3) MG/3ML nebulizer solution 3 mL  3 mL Nebulization Q6H PRN Talbot Factor, Rutwij, MD   3 mL at 12/05/23 1808   lamoTRIgine  (LAMICTAL ) tablet 250 mg  250 mg Per Tube BID Yadav, Priyanka O, MD   250 mg at 12/23/23 2222   LORazepam  (ATIVAN ) injection 2 mg  2 mg Intravenous PRN Yadav, Priyanka O, MD   2 mg at 12/07/23 0422   losartan  (COZAAR ) tablet 25 mg  25 mg Oral Daily Akula, Vijaya, MD   25 mg at 12/23/23 0903   multivitamin with minerals tablet 1 tablet  1 tablet Per Tube Daily Oralee Billow I, RPH   1 tablet at 12/23/23 4782   nystatin  cream (MYCOSTATIN )   Topical BID Desai, Nikita S, MD   Given at 12/24/23 0800   Oral care mouth rinse  15 mL Mouth Rinse 4 times per day Rai,  Ripudeep K, MD   15 mL at 12/24/23 0730   Oral care mouth rinse  15 mL Mouth Rinse PRN Rai, Ripudeep K, MD       pantoprazole  (PROTONIX ) EC tablet 40 mg  40 mg Oral Q0600 Akula, Vijaya, MD       polyethylene glycol (MIRALAX  / GLYCOLAX ) packet 17 g  17 g Oral Daily PRN Talbot Factor, Rutwij, MD       predniSONE  (DELTASONE ) tablet 50 mg  50 mg Oral Q breakfast Hardie Leyland B, NP   50 mg at 12/23/23 0850   Followed by   Cecily Cohen ON  12/25/2023] predniSONE  (DELTASONE ) tablet 40 mg  40 mg Oral Q breakfast Hardie Leyland B, NP       Followed by   Cecily Cohen ON 01/01/2024] predniSONE  (DELTASONE ) tablet 30 mg  30 mg Oral Q breakfast Hardie Leyland B, NP       Followed by   Cecily Cohen ON 01/08/2024] predniSONE  (DELTASONE ) tablet 20 mg  20 mg Oral Q breakfast Hardie Leyland B, NP       Followed by   Cecily Cohen ON 01/15/2024] predniSONE  (DELTASONE ) tablet 10 mg  10 mg Oral Q breakfast Hardie Leyland B, NP       QUEtiapine  (SEROQUEL ) tablet 50 mg  50 mg Per Tube QHS Josiah Nigh, MD   50 mg at 12/23/23 2222   sertraline  (ZOLOFT ) tablet 25 mg  25 mg Per Tube QHS Akula, Vijaya, MD       sodium bicarbonate  tablet 650 mg  650 mg Per Tube BID Akula, Vijaya, MD   650 mg at 12/23/23 2222   thiamine  (VITAMIN B1) tablet 100 mg  100 mg Per Tube Daily Simpson, Paula B, NP   100 mg at 12/23/23 1610   topiramate  (TOPAMAX ) tablet 100 mg  100 mg Per Tube QHS Yadav, Priyanka O, MD   100 mg at 12/23/23 2222     Discharge Medications: Please see discharge summary for a list of discharge medications.  Relevant Imaging Results:  Relevant Lab Results:   Additional Information SSn: [id number]  Jonathan Neighbor, RN

## 2023-12-24 NOTE — Plan of Care (Signed)
  Problem: Clinical Measurements: Goal: Respiratory complications will improve Outcome: Progressing Goal: Cardiovascular complication will be avoided Outcome: Progressing   Problem: Nutrition: Goal: Adequate nutrition will be maintained Outcome: Progressing   Problem: Skin Integrity: Goal: Risk for impaired skin integrity will decrease Outcome: Progressing   Problem: Role Relationship: Goal: Method of communication will improve Outcome: Progressing   Problem: Safety: Goal: Non-violent Restraint(s) Outcome: Progressing   Problem: Ischemic Stroke/TIA Tissue Perfusion: Goal: Complications of ischemic stroke/TIA will be minimized Outcome: Progressing

## 2023-12-25 ENCOUNTER — Inpatient Hospital Stay (HOSPITAL_COMMUNITY): Admitting: Certified Registered"

## 2023-12-25 ENCOUNTER — Other Ambulatory Visit: Payer: Self-pay

## 2023-12-25 ENCOUNTER — Encounter (HOSPITAL_COMMUNITY)
Admission: EM | Disposition: A | Discharge status: Skilled Nursing Facility | Payer: Self-pay | Source: Home / Self Care | Attending: Internal Medicine

## 2023-12-25 DIAGNOSIS — N179 Acute kidney failure, unspecified: Secondary | ICD-10-CM | POA: Diagnosis not present

## 2023-12-25 DIAGNOSIS — E782 Mixed hyperlipidemia: Secondary | ICD-10-CM

## 2023-12-25 DIAGNOSIS — I639 Cerebral infarction, unspecified: Secondary | ICD-10-CM | POA: Diagnosis not present

## 2023-12-25 DIAGNOSIS — J9601 Acute respiratory failure with hypoxia: Secondary | ICD-10-CM | POA: Diagnosis not present

## 2023-12-25 DIAGNOSIS — G40901 Epilepsy, unspecified, not intractable, with status epilepticus: Secondary | ICD-10-CM | POA: Diagnosis not present

## 2023-12-25 DIAGNOSIS — R1310 Dysphagia, unspecified: Secondary | ICD-10-CM

## 2023-12-25 DIAGNOSIS — I10 Essential (primary) hypertension: Secondary | ICD-10-CM | POA: Diagnosis not present

## 2023-12-25 DIAGNOSIS — Z515 Encounter for palliative care: Secondary | ICD-10-CM

## 2023-12-25 DIAGNOSIS — E43 Unspecified severe protein-calorie malnutrition: Secondary | ICD-10-CM | POA: Diagnosis not present

## 2023-12-25 DIAGNOSIS — Z7189 Other specified counseling: Secondary | ICD-10-CM

## 2023-12-25 HISTORY — PX: PEG PLACEMENT: SHX5437

## 2023-12-25 LAB — GLUCOSE, CAPILLARY
Glucose-Capillary: 100 mg/dL — ABNORMAL HIGH (ref 70–99)
Glucose-Capillary: 114 mg/dL — ABNORMAL HIGH (ref 70–99)
Glucose-Capillary: 72 mg/dL (ref 70–99)
Glucose-Capillary: 86 mg/dL (ref 70–99)
Glucose-Capillary: 93 mg/dL (ref 70–99)
Glucose-Capillary: 93 mg/dL (ref 70–99)

## 2023-12-25 LAB — CBC WITH DIFFERENTIAL/PLATELET
Abs Immature Granulocytes: 0.11 10*3/uL — ABNORMAL HIGH (ref 0.00–0.07)
Basophils Absolute: 0 10*3/uL (ref 0.0–0.1)
Basophils Relative: 0 %
Eosinophils Absolute: 0.1 10*3/uL (ref 0.0–0.5)
Eosinophils Relative: 1 %
HCT: 30.8 % — ABNORMAL LOW (ref 36.0–46.0)
Hemoglobin: 10.1 g/dL — ABNORMAL LOW (ref 12.0–15.0)
Immature Granulocytes: 1 %
Lymphocytes Relative: 16 %
Lymphs Abs: 1.8 10*3/uL (ref 0.7–4.0)
MCH: 28.9 pg (ref 26.0–34.0)
MCHC: 32.8 g/dL (ref 30.0–36.0)
MCV: 88 fL (ref 80.0–100.0)
Monocytes Absolute: 0.4 10*3/uL (ref 0.1–1.0)
Monocytes Relative: 4 %
Neutro Abs: 9 10*3/uL — ABNORMAL HIGH (ref 1.7–7.7)
Neutrophils Relative %: 78 %
Platelets: 193 10*3/uL (ref 150–400)
RBC: 3.5 MIL/uL — ABNORMAL LOW (ref 3.87–5.11)
RDW: 18.3 % — ABNORMAL HIGH (ref 11.5–15.5)
WBC: 11.4 10*3/uL — ABNORMAL HIGH (ref 4.0–10.5)
nRBC: 0 % (ref 0.0–0.2)

## 2023-12-25 LAB — BASIC METABOLIC PANEL WITH GFR
Anion gap: 10 (ref 5–15)
BUN: 24 mg/dL — ABNORMAL HIGH (ref 6–20)
CO2: 20 mmol/L — ABNORMAL LOW (ref 22–32)
Calcium: 8.9 mg/dL (ref 8.9–10.3)
Chloride: 108 mmol/L (ref 98–111)
Creatinine, Ser: 0.7 mg/dL (ref 0.44–1.00)
GFR, Estimated: >60 mL/min (ref >60)
Glucose, Bld: 95 mg/dL (ref 70–99)
Potassium: 3.7 mmol/L (ref 3.5–5.1)
Sodium: 138 mmol/L (ref 135–145)

## 2023-12-25 LAB — TYPE AND SCREEN
ABO/RH(D): A POS
Antibody Screen: NEGATIVE

## 2023-12-25 LAB — ABO/RH: ABO/RH(D): A POS

## 2023-12-25 LAB — OCCULT BLOOD X 1 CARD TO LAB, STOOL: Fecal Occult Bld: POSITIVE — AB

## 2023-12-25 SURGERY — INSERTION, PEG TUBE
Anesthesia: General

## 2023-12-25 MED ORDER — FREE WATER
200.0000 mL | Status: DC
  Administered 2023-12-25 – 2024-01-02 (×41): 200 mL

## 2023-12-25 MED ORDER — ALBUMIN HUMAN 5 % IV SOLN
25.0000 g | Freq: Once | INTRAVENOUS | Status: AC
  Administered 2023-12-25: 25 g via INTRAVENOUS

## 2023-12-25 MED ORDER — SUGAMMADEX SODIUM 200 MG/2ML IV SOLN
INTRAVENOUS | Status: DC | PRN
  Administered 2023-12-25: 200 mg via INTRAVENOUS

## 2023-12-25 MED ORDER — DEXAMETHASONE SODIUM PHOSPHATE 10 MG/ML IJ SOLN
INTRAMUSCULAR | Status: AC
  Filled 2023-12-25: qty 1

## 2023-12-25 MED ORDER — FENTANYL CITRATE (PF) 250 MCG/5ML IJ SOLN
INTRAMUSCULAR | Status: AC
  Filled 2023-12-25: qty 5

## 2023-12-25 MED ORDER — BUPIVACAINE LIPOSOME 1.3 % IJ SUSP
INTRAMUSCULAR | Status: AC
  Filled 2023-12-25: qty 20

## 2023-12-25 MED ORDER — PHENYLEPHRINE HCL-NACL 20-0.9 MG/250ML-% IV SOLN
INTRAVENOUS | Status: DC | PRN
  Administered 2023-12-25: 50 ug/min via INTRAVENOUS

## 2023-12-25 MED ORDER — ROCURONIUM BROMIDE 10 MG/ML (PF) SYRINGE
PREFILLED_SYRINGE | INTRAVENOUS | Status: DC | PRN
  Administered 2023-12-25: 50 mg via INTRAVENOUS

## 2023-12-25 MED ORDER — ORAL CARE MOUTH RINSE: 15.0000 mL | Freq: Once | OROMUCOSAL | Status: AC

## 2023-12-25 MED ORDER — OXYCODONE HCL 5 MG PO TABS: 5.0000 mg | ORAL_TABLET | Freq: Once | ORAL | Status: DC | PRN

## 2023-12-25 MED ORDER — HYDROMORPHONE HCL 1 MG/ML IJ SOLN: 0.2500 mg | INTRAMUSCULAR | Status: DC | PRN

## 2023-12-25 MED ORDER — FENTANYL CITRATE (PF) 250 MCG/5ML IJ SOLN
INTRAMUSCULAR | Status: DC | PRN
  Administered 2023-12-25: 100 ug via INTRAVENOUS

## 2023-12-25 MED ORDER — LIDOCAINE 2% (20 MG/ML) 5 ML SYRINGE
INTRAMUSCULAR | Status: DC | PRN
  Administered 2023-12-25: 60 mg via INTRAVENOUS

## 2023-12-25 MED ORDER — CHLORHEXIDINE GLUCONATE 0.12 % MT SOLN
15.0000 mL | Freq: Once | OROMUCOSAL | Status: AC
  Administered 2023-12-25: 15 mL via OROMUCOSAL
  Filled 2023-12-25: qty 15

## 2023-12-25 MED ORDER — OXYCODONE HCL 5 MG/5ML PO SOLN: 5.0000 mg | Freq: Once | ORAL | Status: DC | PRN

## 2023-12-25 MED ORDER — LIDOCAINE HCL (PF) 1 % IJ SOLN
INTRAMUSCULAR | Status: DC | PRN
  Administered 2023-12-25: 5 mL

## 2023-12-25 MED ORDER — PROPOFOL 10 MG/ML IV BOLUS
INTRAVENOUS | Status: DC | PRN
Start: 2023-12-25 — End: 2023-12-25
  Administered 2023-12-25: 100 mg via INTRAVENOUS

## 2023-12-25 MED ORDER — PANTOPRAZOLE SODIUM 40 MG IV SOLR
40.0000 mg | INTRAVENOUS | Status: DC
  Administered 2023-12-25 – 2024-01-02 (×9): 40 mg via INTRAVENOUS
  Filled 2023-12-25 (×9): qty 10

## 2023-12-25 MED ORDER — ROCURONIUM BROMIDE 10 MG/ML (PF) SYRINGE
PREFILLED_SYRINGE | INTRAVENOUS | Status: AC
  Filled 2023-12-25: qty 10

## 2023-12-25 MED ORDER — JEVITY 1.5 CAL/FIBER PO LIQD
1000.0000 mL | ORAL | Status: DC
  Administered 2023-12-25 – 2024-01-01 (×7): 1000 mL
  Filled 2023-12-25 (×11): qty 1000

## 2023-12-25 MED ORDER — MIDAZOLAM HCL 2 MG/2ML IJ SOLN
INTRAMUSCULAR | Status: AC
  Filled 2023-12-25: qty 2

## 2023-12-25 MED ORDER — AMISULPRIDE (ANTIEMETIC) 5 MG/2ML IV SOLN: 10.0000 mg | Freq: Once | INTRAVENOUS | Status: DC | PRN

## 2023-12-25 MED ORDER — ALBUMIN HUMAN 5 % IV SOLN
INTRAVENOUS | Status: AC
  Filled 2023-12-25: qty 500

## 2023-12-25 MED ORDER — CHLORHEXIDINE GLUCONATE 0.12 % MT SOLN
15.0000 mL | OROMUCOSAL | Status: AC
  Filled 2023-12-25: qty 15

## 2023-12-25 MED ORDER — BUPIVACAINE HCL (PF) 0.25 % IJ SOLN
INTRAMUSCULAR | Status: AC
  Filled 2023-12-25: qty 30

## 2023-12-25 MED ORDER — PROPOFOL 10 MG/ML IV BOLUS
INTRAVENOUS | Status: AC
  Filled 2023-12-25: qty 20

## 2023-12-25 MED ORDER — LACTATED RINGERS IV SOLN: INTRAVENOUS | Status: DC

## 2023-12-25 MED ORDER — ONDANSETRON HCL 4 MG/2ML IJ SOLN
INTRAMUSCULAR | Status: AC
  Filled 2023-12-25: qty 2

## 2023-12-25 MED ORDER — PROSOURCE TF20 ENFIT COMPATIBL EN LIQD
60.0000 mL | Freq: Every day | ENTERAL | Status: DC
  Administered 2023-12-26 – 2024-01-02 (×7): 60 mL
  Filled 2023-12-25 (×8): qty 60

## 2023-12-25 SURGICAL SUPPLY — 33 items
BAG COUNTER SPONGE SURGICOUNT (BAG) ×1 IMPLANT
BLADE CLIPPER SURG (BLADE) IMPLANT
BLOCK BITE 60FR ADLT L/F BLUE (MISCELLANEOUS) ×1 IMPLANT
BUTTON OLYMPUS DEFENDO 5 PIECE (MISCELLANEOUS) ×1 IMPLANT
CANISTER SUCT 3000ML PPV (MISCELLANEOUS) ×1 IMPLANT
CHLORAPREP W/TINT 26 (MISCELLANEOUS) ×1 IMPLANT
COVER SURGICAL LIGHT HANDLE (MISCELLANEOUS) ×1 IMPLANT
DERMABOND ADVANCED .7 DNX6 (GAUZE/BANDAGES/DRESSINGS) ×1 IMPLANT
ELECTRODE REM PT RTRN 9FT ADLT (ELECTROSURGICAL) ×1 IMPLANT
GAUZE SPONGE 4X4 12PLY STRL (GAUZE/BANDAGES/DRESSINGS) IMPLANT
GLOVE BIO SURGEON STRL SZ 6.5 (GLOVE) ×1 IMPLANT
GLOVE BIOGEL PI IND STRL 6 (GLOVE) ×1 IMPLANT
GOWN STRL REUS W/ TWL LRG LVL3 (GOWN DISPOSABLE) ×2 IMPLANT
IRRIGATION SUCT STRKRFLW 2 WTP (MISCELLANEOUS) IMPLANT
KIT BASIN OR (CUSTOM PROCEDURE TRAY) ×1 IMPLANT
KIT CLEAN ENDO COMPLIANCE (KITS) ×1 IMPLANT
KIT PG 24FR PUL EVV ENFIT (KITS) ×1 IMPLANT
KIT TURNOVER KIT B (KITS) ×1 IMPLANT
NS IRRIG 1000ML POUR BTL (IV SOLUTION) ×1 IMPLANT
PAD ARMBOARD POSITIONER FOAM (MISCELLANEOUS) ×2 IMPLANT
SCISSORS LAP 5X35 DISP (ENDOMECHANICALS) IMPLANT
SET TUBE SMOKE EVAC HIGH FLOW (TUBING) ×1 IMPLANT
SLEEVE Z-THREAD 5X100MM (TROCAR) ×1 IMPLANT
SUT MNCRL AB 4-0 PS2 18 (SUTURE) ×1 IMPLANT
TOWEL GREEN STERILE (TOWEL DISPOSABLE) ×1 IMPLANT
TOWEL GREEN STERILE FF (TOWEL DISPOSABLE) ×1 IMPLANT
TRAY LAPAROSCOPIC MC (CUSTOM PROCEDURE TRAY) ×1 IMPLANT
TROCAR BALLN 12MMX100 BLUNT (TROCAR) IMPLANT
TROCAR Z-THREAD OPTICAL 5X100M (TROCAR) ×1 IMPLANT
TUBE CONNECTING 12X1/4 (SUCTIONS) ×1 IMPLANT
TUBING ENDO SMARTCAP (MISCELLANEOUS) ×1 IMPLANT
WARMER LAPAROSCOPE (MISCELLANEOUS) ×1 IMPLANT
WATER STERILE IRR 1000ML POUR (IV SOLUTION) ×1 IMPLANT

## 2023-12-25 NOTE — Plan of Care (Signed)
  Problem: Health Behavior/Discharge Planning: Goal: Ability to manage health-related needs will improve Outcome: Progressing   Problem: Clinical Measurements: Goal: Ability to maintain clinical measurements within normal limits will improve Outcome: Progressing   Problem: Clinical Measurements: Goal: Will remain free from infection Outcome: Progressing   Problem: Clinical Measurements: Goal: Diagnostic test results will improve Outcome: Progressing   Problem: Nutrition: Goal: Adequate nutrition will be maintained Outcome: Progressing   Problem: Activity: Goal: Risk for activity intolerance will decrease Outcome: Progressing   Problem: Safety: Goal: Ability to remain free from injury will improve Outcome: Progressing   Problem: Skin Integrity: Goal: Risk for impaired skin integrity will decrease Outcome: Progressing   Problem: Tissue Perfusion: Goal: Adequacy of tissue perfusion will improve Outcome: Progressing   Problem: Safety: Goal: Non-violent Restraint(s) Outcome: Progressing

## 2023-12-25 NOTE — Op Note (Signed)
   Procedure Note  Date: 12/25/2023  Procedure: esophagogastroduodenoscopy (EGD) and percutaneous endoscopic gastrostomy (PEG) tube placement  Pre-op diagnosis: dysphagia Post-op diagnosis: same  Indication and clinical history: 22F with dysphagia  Surgeon: Anda Bamberg, MD  Anesthesia: general anesthesia  Findings:  Specimen: none EBL: <5cc Drains/Implants: PEG tube,  3.5 cm at the skin   Disposition: ICU/PACU  Description of Procedure: The patient was positioned semi-recumbent. Time-out was performed verifying correct patient, procedure, signature of informed consent, and pre-operative antibiotics as indicated. MAC induction was uneventful and a bite block was placed into the oropharynx. The endoscope was inserted into the oropharynx and advanced down the esophagus into the stomach and into the duodenum. The visualized esophagus and duodenum were unremarkable. The endoscope was retracted back into the stomach and the stomach was insufflated. The stomach was inspected and was also normal. Transillumination was performed. The light was visible on the external skin and dimpling of the stomach was noted endoscopically with manual pressure. The abdomen was prepped and draped in the usual sterile fashion. Transillumination and dimpling were repeated and local anesthetic was infiltrated to make a skin wheal at the site of transillumination. The needle was inserted perpendicularly to the skin and the tip of the needle was visualized endoscopically. As the needle was retracted, the tract was also anesthetized. A skin nick was made at the site of the wheal and an introducer needle and sheath were inserted. The needle was removed and guidewire inserted. The guidewire was grasped by an endoscopic snare and the snare, guidewire, and endoscope retracted out of the oropharynx. The PEG tube was secured to the guidewire and retracted through the mouth and esophagus into the stomach. The PEG tube was  secured with a bolster and was visualized endoscopically to spin freely circumferentially and also be without gaps between the internal bumper and the stomach wall. There was no evidence of bleeding. The PEG bolster was secured at  3.5 cm at the skin and there were no gaps between the bolster and the abdominal wall. The stomach was desufflated endoscopically and the endoscope removed. The bite block was also removed. The patient tolerated the procedure well and there were no complications.   The patient may have water  and medications administered via the PEG tube beginning immediately and tube feeds may be initiated four hours post-procedure.    Anda Bamberg, MD General and Trauma Surgery [facility]

## 2023-12-25 NOTE — Transfer of Care (Signed)
 Immediate Anesthesia Transfer of Care Note  Patient: Meredith Park  Procedure(s) Performed: INSERTION, PEG TUBE  Patient Location: PACU  Anesthesia Type:General  Level of Consciousness: awake, drowsy, and responds to stimulation  Airway & Oxygen Therapy: spontaneously breathing  Post-op Assessment: Report given to RN and Post -op Vital signs reviewed and stable  Post vital signs: Reviewed and stable  Last Vitals:  Vitals Value Taken Time  BP 101/71 12/25/23 1505  Temp 36.4 C 12/25/23 1505  Pulse 63 12/25/23 1507  Resp 14 12/25/23 1508  SpO2 96 % 12/25/23 1507  Vitals shown include unfiled device data.  Last Pain: pt resting comfortably  Vitals:   12/25/23 1156  TempSrc: Oral  PainSc:          Complications: No notable events documented.

## 2023-12-25 NOTE — Progress Notes (Signed)
 Nutrition Follow-up  DOCUMENTATION CODES:  Severe malnutrition in context of social or environmental circumstances  INTERVENTION:  Continue tube feeding once PEG in place: Jevity 1.5 at 50 ml/h (1200 ml per day) Restart at 20 and advance by 10 q4h to goal of 50 Prosource TF20 60 ml 1x/d Adjust FWF to q4h Provides 1880 kcal, 97 gm protein, 914 ml free water  daily (TF+flush = free water  daily) Continue MVI, folic acid , Thiamine  daily  When ready to transition to bolus feeds, recommend the following: 5 cartons of Jevity 1.5 (or equivalent) daily Initiate at 1/2 a carton x 2 feeds and increase by 1/2 of a carton every 2 feeds if tolerated until goal of 1 cartons 5x/d is achieved ( ) Flush with 50mL of free water  before and after each feed ( total 5x/d) plus an additional 200mL flush 3x/d This regimen will provide 2130kcal, 91g of protein, free water  ( of free water  = TF+flush)  NUTRITION DIAGNOSIS:  Severe Malnutrition related to social / environmental circumstances as evidenced by severe muscle depletion, percent weight loss. - Ongoing.   GOAL:  Patient will meet greater than or equal to 90% of their needs - Met with enteral feeds  MONITOR:  TF tolerance, Diet advancement, I & O's, Labs, Weight trends  REASON FOR ASSESSMENT:  Ventilator    ASSESSMENT:  Pt with hx of alcohol abuse, drug abuse, HTN, and HLD presented to ED after being found down at home with seizure-like activity.  3/25 - admitted; intubated 3/27 - increased pressor requirement worsening MOD 3/28 - s/p cortrak placement (gastric) 3/29 - improved pressor requirements 4/2 - extubated; re-intubated due to stridor and secretions 4/7 - Extubated, NPO  4/13 - cortrak clogged, removed and replaced with NGT, transferred to 3W 4/16 - pt removed NGT, cortrak placed 4/21 - PEG attempted by IR, no safe window and procedure aborted, cortrak removed by IR, replaced after arrival back to  unit 4/22 - PEG placement in OR by surgery team planned for today  Pt out of room for PEG placement at the time of assessment. Has been tolerating TF regimen, will monitor for ability to resume feeds when PEG is in place.    Noted that stool positive for blood overnight. GI consult pending.  Admit weight: 63 kg  Current weight: 59.8 kg  39% weight loss x 12 months (11/23/22-admission 11/28/23), confirmed to be accurate with mother of pt  Nutritionally Relevant Medications: Scheduled Meds:  atorvastatin   40 mg Per Tube Daily   PROSource TF20  60 mL Per Tube BID   BANATROL TF  60 mL Per Tube BID   folic acid   1 mg Per Tube Daily   free water   200 mL Per Tube Q4H   insulin  aspart  0-9 Units Subcutaneous Q4H   multivitamin with minerals  1 tablet Per Tube Daily   pantoprazole   40 mg Oral Q0600   predniSONE   40 mg Oral Q breakfast   sodium bicarbonate   650 mg Per Tube BID   thiamine   100 mg Per Tube Daily   PRN Meds: docusate sodium , polyethylene glycol  Labs Reviewed: BUN 24 CBG ranges from 93-114 mg/dL over the last 24 hours HgbA1c 5% (3/27)  NUTRITION - FOCUSED PHYSICAL EXAM: Flowsheet Row Most Recent Value  Orbital Region Mild depletion  Upper Arm Region Mild depletion  Thoracic and Lumbar Region No depletion  Buccal Region Moderate depletion  Temple Region Mild depletion  Clavicle Bone Region Mild depletion  Clavicle and  Acromion Bone Region Moderate depletion  Scapular Bone Region Moderate depletion  Dorsal Hand Severe depletion  Patellar Region Severe depletion  Anterior Thigh Region Severe depletion  Posterior Calf Region Moderate depletion  Edema (RD Assessment) Mild  [hand]  Hair Reviewed  Eyes Unable to assess  Mouth Unable to assess  Skin Reviewed  Nails Reviewed   Diet Order:   Diet Order             Diet NPO time specified  Diet effective now                   EDUCATION NEEDS:  Not appropriate for education at this time  Skin:  Skin  Assessment: Reviewed RN Assessment  Last BM:  4/21 - type 7 FMS placed 4/5, of stool output noted in 24 hours  Height:  Ht Readings from Last 1 Encounters:  12/25/23 5\' 2"  (1.575 m)    Weight:  Wt Readings from Last 1 Encounters:  12/25/23 59.8 kg    Ideal Body Weight:  50 kg  BMI:  Body mass index is 24.11 kg/m.  Estimated Nutritional Needs:  Kcal:  1800-2000 kcal/d Protein:  100-120 grams Fluid:  1.8-2L/d    Edwena Graham, RD, LDN Registered Dietitian II Please reach out via secure chat

## 2023-12-25 NOTE — Progress Notes (Signed)
 Noticed that stool output color in the rectal pouch has changed to red tint or bloody color from the previous brown stool output, showed it also to CN, had informed MD.

## 2023-12-25 NOTE — Progress Notes (Signed)
 Trauma/Critical Care Follow Up Note  Subjective:    Overnight Issues:   Objective:  Vital signs for last 24 hours: Temp:  [97.4 F (36.3 C)-98.6 F (37 C)] 98.6 F (37 C) (04/22 1156) Pulse Rate:  [82-102] 82 (04/22 1156) Resp:  [14-20] 20 (04/22 1156) BP: (91-125)/(64-93) 125/83 (04/22 1156) SpO2:  [98 %-100 %] 100 % (04/22 1156) Weight:  [59.8 kg] 59.8 kg (04/22 0500)  Hemodynamic parameters for last 24 hours:    Intake/Output from previous day: 04/21 0701 - 04/22 0700 In: -  Out: 1800 [Urine:1500; Stool:300]  Intake/Output this shift: Total I/O In: -  Out: 550 [Urine:550]  Vent settings for last 24 hours:    Physical Exam:  Gen: comfortable, no distress Neuro: not following commands HEENT: PERRL Neck: c-collar in place CV: RRR Pulm: unlabored breathing on [place] Abd: soft, NT   ,    GU: urine clear and yellow, +spontaneous voids Extr: wwp, no edema  Results for orders placed or performed during the hospital encounter of 11/27/23 (from the past 24 hours)  Glucose, capillary     Status: Abnormal   Collection Time: 12/24/23  4:01 PM  Result Value Ref Range   Glucose-Capillary 100 (H) 70 - 99 mg/dL   Comment 1 Notify RN    Comment 2 Document in Chart   Glucose, capillary     Status: None   Collection Time: 12/24/23  8:04 PM  Result Value Ref Range   Glucose-Capillary 96 70 - 99 mg/dL   Comment 1 Notify RN    Comment 2 Document in Chart   Glucose, capillary     Status: Abnormal   Collection Time: 12/24/23 11:58 PM  Result Value Ref Range   Glucose-Capillary 114 (H) 70 - 99 mg/dL   Comment 1 Notify RN    Comment 2 Document in Chart   Glucose, capillary     Status: None   Collection Time: 12/25/23  3:41 AM  Result Value Ref Range   Glucose-Capillary 93 70 - 99 mg/dL   Comment 1 Notify RN    Comment 2 Document in Chart   Occult blood card to lab, stool     Status: Abnormal   Collection Time: 12/25/23  6:15 AM  Result Value Ref Range   Fecal Occult  Bld POSITIVE (A) NEGATIVE  Glucose, capillary     Status: Abnormal   Collection Time: 12/25/23  7:58 AM  Result Value Ref Range   Glucose-Capillary 100 (H) 70 - 99 mg/dL  Type and screen [facility]     Status: None   Collection Time: 12/25/23  9:37 AM  Result Value Ref Range   ABO/RH(D) A POS    Antibody Screen NEG    Sample Expiration      12/28/2023,2359 Performed at [facility], [address], [place], [place] [address]   CBC with Differential/Platelet     Status: Abnormal   Collection Time: 12/25/23  9:41 AM  Result Value Ref Range   WBC 11.4 (H) 4.0 - 10.5 K/uL   RBC 3.50 (L) 3.87 - 5.11 MIL/uL   Hemoglobin 10.1 (L) 12.0 - 15.0 g/dL   HCT 60.4 (L) 54.0 - 98.1 %   MCV 88.0 80.0 - 100.0 fL   MCH 28.9 26.0 - 34.0 pg   MCHC 32.8 30.0 - 36.0 g/dL   RDW 19.1 (H) 47.8 - 29.5 %   Platelets 193 150 - 400 K/uL   nRBC 0.0 0.0 - 0.2 %  Neutrophils Relative % 78 %   Neutro Abs 9.0 (H) 1.7 - 7.7 K/uL   Lymphocytes Relative 16 %   Lymphs Abs 1.8 0.7 - 4.0 K/uL   Monocytes Relative 4 %   Monocytes Absolute 0.4 0.1 - 1.0 K/uL   Eosinophils Relative 1 %   Eosinophils Absolute 0.1 0.0 - 0.5 K/uL   Basophils Relative 0 %   Basophils Absolute 0.0 0.0 - 0.1 K/uL   Immature Granulocytes 1 %   Abs Immature Granulocytes 0.11 (H) 0.00 - 0.07 K/uL  Basic metabolic panel     Status: Abnormal   Collection Time: 12/25/23  9:41 AM  Result Value Ref Range   Sodium 138 135 - 145 mmol/L   Potassium 3.7 3.5 - 5.1 mmol/L   Chloride 108 98 - 111 mmol/L   CO2 20 (L) 22 - 32 mmol/L   Glucose, Bld 95 70 - 99 mg/dL   BUN 24 (H) 6 - 20 mg/dL   Creatinine, Ser 1.91 0.44 - 1.00 mg/dL   Calcium  8.9 8.9 - 10.3 mg/dL   GFR, Estimated >47 >82 mL/min   Anion gap 10 5 - 15  Glucose, capillary     Status: None   Collection Time: 12/25/23 11:48 AM  Result Value Ref Range   Glucose-Capillary 93 70 - 99 mg/dL    Assessment & Plan:  Present on Admission:  Status epilepticus  (HCC)    LOS: 28 days   Additional comments:I reviewed the patient's new clinical lab test results.   and I reviewed the patients new imaging test results.    Dysphagia, s/p Status Epilepticus - plan PEG today. Possible need for conversion to lap assisted, so will plan to do in OR. Consent obtained form patient's daughter via phone.    Anda Bamberg, MD Trauma & General Surgery Please use [email or web address] to contact on call provider  12/25/2023  *Care during the described time interval was provided by me. I have reviewed this patient's available data, including medical history, events of note, physical examination and test results as part of my evaluation.

## 2023-12-25 NOTE — Progress Notes (Signed)
 Patient transport to OR report call ed to [facility], Charity fundraiser.

## 2023-12-25 NOTE — Progress Notes (Signed)
 SLP Cancellation Note  Patient Details Name: Meredith Park MRN: [id number] DOB: August 26, 1971   Cancelled treatment:       Reason Eval/Treat Not Completed: Patient at procedure or test/unavailable. Plan for PEG today   Lochlann Mastrangelo, Hardin Leys 12/25/2023, 8:53 AM

## 2023-12-25 NOTE — Anesthesia Postprocedure Evaluation (Signed)
 Anesthesia Post Note  Patient: Meredith Park  Procedure(s) Performed: INSERTION, PEG TUBE     Patient location during evaluation: PACU Anesthesia Type: General Level of consciousness: awake and alert Pain management: pain level controlled Vital Signs Assessment: post-procedure vital signs reviewed and stable Respiratory status: spontaneous breathing, nonlabored ventilation and respiratory function stable Cardiovascular status: blood pressure returned to baseline and stable Postop Assessment: no apparent nausea or vomiting Anesthetic complications: no   There were no known notable events for this encounter.  Last Vitals:  Vitals:   12/25/23 1530 12/25/23 1545  BP: 91/68 105/75  Pulse: 63 63  Resp: (!) 9 (!) 6  Temp:    SpO2: 98% 99%    Last Pain:  Vitals:   12/25/23 1156  TempSrc: Oral  PainSc:    Pain Goal:                   [name]

## 2023-12-25 NOTE — Plan of Care (Signed)
   Medical records reviewed including progress notes, labs, and imaging. Noted plans for PEG placement in OR today.  Goals of care remain clear for full code/full scope treatment and aggressive rehabilitation. Family has been encouraged to reach out with additional needs.  PMT will sign off at this time. Thank you for your referral and allowing PMT to assist in Meredith Park's care.   Angelika Jerrett, PA-C Palliative Medicine Team  Team Phone # [phone]   NO CHARGE

## 2023-12-25 NOTE — Progress Notes (Signed)
 [facility]                                                                               Meredith Park, is a 53 y.o. female, DOB - 10/03/1970, UJW:[id number] Admit date - 11/27/2023    Outpatient Primary MD for the patient is Fulbright, [place]  E, PA-C  LOS - 28  days    Brief summary   53 year old female with history of alcohol abuse, HLD, seizures, hypertension, abstinence from alcohol for last 1 year, found down beside her bed by her family members, was noted to be shaking and unresponsive.  EMS was called and was found to be in generalized tonic-clonic seizures, treated with Versed  with cessation of seizures, remained minimally responsive in the ED.  Hypoxemic requiring 10 L O2 and was intubated to protect airway and admitted to the ICU. Significant events as below. 3/25 admit to ICU.  Hypoxemic and unresponsive.  Intubated 3/26 LP performed. Increasing pressor requirement 3/27 Increased vasopressor requirement with worsening MOD including AKI, shock liver. Echo with stress CM. 3/29 Improved vasopressor requirements 4/2 central line d/c, extubated (had cuff leak), reintubated for stridor & secretions several hours later 4/5 CTH w/progression of unihemispheric edema, thought more c/w seizure changes 4/6 LTM, steroids, 4/7 extubated, intermittently on predex for agitation, LTM d/c'd, tmax 101.6/ WBC stable 4/10 on room air.  Transferred to medical floor.  Core track feeding. 4/11 full of secretions unable to clear , requiring non- rebreather, transferred back to ICU 4/14: Transferred out of ICU, [facility] 4/17 patient working with SLP making progress. PEG placement requested.  4/18 MRI brain reviewed and neurology signed off. 4/21 IR unable to place the PEG due to colon / safe window. Gen surgery consulted. 4/22 surgical PEG placement today. Overnight patient had some blood mixed with stool in flexi seal positive for occult blood. GI consulted for recommendations.    Assessment & Plan    Assessment and Plan:  Status epilepticus with history of seizure disorder Acute encephalopathy secondary to status epilepticus Right cerebral hemisphere edema consistent with postictal state Neurology on board suspect this is postsurgical versus potential autoimmune/paraneoplastic/vasculitis. CT of the chest abdomen and pelvis without any signs of malignancy Neurology recommends to continue with Topamax  100 mg daily, lamotrigine  250 mg BID and DC Vimpat  and  to continue with Prednisone  taper by 10 mg every week. Currently she is on prednisone  40 mg starting from 4/22. Repeat MRI brain without contrast ordered, showed Recent punctate infarcts in the left temporal subcortical white matter and superior right cerebellum. Evolutionary changes throughout the right cerebral and left cerebellar hemispheres as described above most compatible with evolving injury from recent status epilepticus. She is also on aspirin  81 mg daily and lipitor 40 mg daily for secondary stroke prevention.     Acute hypoxic respiratory failure secondary to aspiration versus volume overload -Patient was initially intubated for status epilepticus and airway protection, subsequently reintubated due to aspiration versus volume overload, has been extubated - O2 sats 100% on room air  - Continue PT OT, I-S, SLP - completed course of antibiotics.  - repeat CXR wnl.  - echocardiogram showed LVEF of 40 to 45%, left ventricle  demonstrates regional wall abnormalities.    Dysphagia/ Nutrition Patient on core track, failed SLP evaluation Palliative medicine consulted for goals of care/PEG tubes. Continue with full scope treatments with aggressive life prolonging interventions including PEG.  PEG requested from IR.IR unable to place the PEG due to colon / safe window. Gen surgery consulted and she is scheduled for surgical PEG later this afternoon.     New onset cardiomyopathy, elevated troponin likely  due to demand ischemia Hypertension - 2D echo 11/28/2023 showed EF of 40 to 45%, G1 DD, regional WMA -Seen by cardiology, recommending conservative management for now.  - recommend repeat Echocardiogram in the near future and if she has meaningful neurological recovery, please reach out to cardiology for further work up of new onset cardiomyopathy.  - currently on coreg  and losartan .     Acute Kidney injury secondary to rhabdomyolysis, ATN, metabolic acidosis Creatinine is back to baseline,  Bicarb has improved to 20.   Hypernatremia.  Resolved.    Anemia of acute illness/ guaiac positive stools:  GI consulted for recommendations.  Change oral PPI to IV PPI.  Monitor.   Acute transaminitis Check liver panel in am.    Hyperglycemia probably secondary to steroids and tube feeds Continue with sliding scale insulin . CBG (last 3)  Recent Labs    12/25/23 0341 12/25/23 0758 12/25/23 1148  GLUCAP 93 100* 93   No changes in  meds.    Leukocytosis from steroids.  Improving.   Hypotension on 4/21  From iv fentanyl  and versad Ordered fluid bolus with RL,  BP parameters improved.    RN Pressure Injury Documentation:    Malnutrition Type:  Nutrition Problem: Severe Malnutrition Etiology: social / environmental circumstances   Malnutrition Characteristics:  Signs/Symptoms: severe muscle depletion, percent weight loss   Nutrition Interventions:  Interventions: Prostat, Tube feeding, MVI  Estimated body mass index is 24.11 kg/m as calculated from the following:   Height as of this encounter: 5' 2.01" (1.575 m).   Weight as of this encounter: 59.8 kg.  Code Status: full code.  DVT Prophylaxis:  heparin  injection 5,000 Units Start: 12/04/23 0930 SCDs Start: 11/27/23 2310   Level of Care: Level of care: Progressive Family Communication: none at bedside.   Disposition Plan:     Remains inpatient appropriate:  pending clinical improvement, possible SNF  placement once PEG is placed.   Procedures:  MRI BRAIN without contrast  Consultants:   Neurology.  General surgery.  IR  Gastroenterology.  Cardiology.   Antimicrobials:   Anti-infectives (From admission, onward)    Start     Dose/Rate Route Frequency Ordered Stop   12/24/23 0910  ceFAZolin  (ANCEF ) IVPB 2g/100 mL premix        over 30 Minutes Intravenous Continuous PRN 12/24/23 0911 12/24/23 0910   12/14/23 1245  Ampicillin -Sulbactam (UNASYN ) 3 g in sodium chloride  0.9 % 100 mL IVPB        3 g 200 mL/hr over 30 Minutes Intravenous Every 6 hours 12/14/23 1154 12/19/23 0640   12/04/23 0930  piperacillin -tazobactam (ZOSYN ) IVPB 3.375 g  Status:  Discontinued        3.375 g 12.5 mL/hr over 240 Minutes Intravenous Every 8 hours 12/04/23 0839 12/10/23 1048   11/30/23 1000  cefTRIAXone  (ROCEPHIN ) 2 g in sodium chloride  0.9 % 100 mL IVPB  Status:  Discontinued        2 g 200 mL/hr over 30 Minutes Intravenous Every 24 hours 11/29/23 1006 12/04/23 0830   11/29/23 1000  vancomycin  (VANCOREADY) IVPB 750 mg/150 mL  Status:  Discontinued        750 mg 150 mL/hr over 60 Minutes Intravenous Every 24 hours 11/28/23 0245 11/29/23 1006   11/28/23 2200  acyclovir  (ZOVIRAX ) 630 mg in dextrose  5 % 100 mL IVPB  Status:  Discontinued        10 mg/kg  63 kg 112.6 mL/hr over 60 Minutes Intravenous Every 24 hours 11/28/23 0247 11/29/23 0836   11/28/23 1000  ampicillin  (OMNIPEN) 2 g in sodium chloride  0.9 % 100 mL IVPB  Status:  Discontinued        2 g 300 mL/hr over 20 Minutes Intravenous Every 8 hours 11/28/23 0249 11/29/23 1006   11/28/23 0115  vancomycin  (VANCOREADY) IVPB 2000 mg/400 mL        2,000 mg 200 mL/hr over 120 Minutes Intravenous  Once 11/28/23 0028 11/28/23 0308   11/28/23 0115  cefTRIAXone  (ROCEPHIN ) 2 g in sodium chloride  0.9 % 100 mL IVPB  Status:  Discontinued        2 g 200 mL/hr over 30 Minutes Intravenous Every 12 hours 11/28/23 0028 11/29/23 1006   11/28/23 0115  ampicillin   (OMNIPEN) 2 g in sodium chloride  0.9 % 100 mL IVPB        2 g 300 mL/hr over 20 Minutes Intravenous  Once 11/28/23 0028 11/28/23 0129   11/28/23 0100  acyclovir  (ZOVIRAX ) 700 mg in dextrose  5 % 100 mL IVPB        700 mg 114 mL/hr over 60 Minutes Intravenous  Once 11/28/23 0029 11/28/23 0301        Medications  Scheduled Meds:  acetaminophen  (TYLENOL ) oral liquid 160 mg/5 mL  650 mg Per Tube Q6H   [START ON 12/27/2023] aspirin   81 mg Per Tube Daily   atorvastatin   40 mg Per Tube Daily   carvedilol   3.125 mg Per Tube BID WC   Chlorhexidine  Gluconate Cloth  6 each Topical Daily   feeding supplement (PROSource TF20)  60 mL Per Tube BID   fiber supplement (BANATROL TF)  60 mL Per Tube BID   folic acid   1 mg Per Tube Daily   free water   200 mL Per Tube Q4H   heparin  injection (subcutaneous)  5,000 Units Subcutaneous Q8H   insulin  aspart  0-9 Units Subcutaneous Q4H   lamoTRIgine   250 mg Per Tube BID   losartan   25 mg Oral Daily   multivitamin with minerals  1 tablet Per Tube Daily   nystatin  cream   Topical BID   mouth rinse  15 mL Mouth Rinse 4 times per day   pantoprazole  (PROTONIX ) IV  40 mg Intravenous Q24H   predniSONE   40 mg Oral Q breakfast   Followed by   Cecily Cohen ON 01/01/2024] predniSONE   30 mg Oral Q breakfast   Followed by   Cecily Cohen ON 01/08/2024] predniSONE   20 mg Oral Q breakfast   Followed by   Cecily Cohen ON 01/15/2024] predniSONE   10 mg Oral Q breakfast   QUEtiapine   50 mg Per Tube QHS   sertraline   25 mg Per Tube QHS   sodium bicarbonate   650 mg Per Tube BID   thiamine   100 mg Per Tube Daily   topiramate   100 mg Per Tube QHS   Continuous Infusions:   PRN Meds:.docusate sodium , hydrALAZINE , hydrocortisone  cream, iohexol , ipratropium-albuterol , LORazepam , mouth rinse, polyethylene glycol    Subjective:   Meredith Park was seen and examined today.   Alert and interacting today.  Appears frustrated. Not in distress.   Objective:   Vitals:   12/25/23 0342 12/25/23  0500 12/25/23 0808 12/25/23 1156  BP: 98/64  (!) 124/90 125/83  Pulse: 83  85 82  Resp: 17  20 20   Temp: 98 F (36.7 C)  97.9 F (36.6 C) 98.6 F (37 C)  TempSrc: Axillary  Axillary Oral  SpO2: 99%  100% 100%  Weight:  59.8 kg    Height:        Intake/Output Summary (Last 24 hours) at 12/25/2023 1251 Last data filed at 12/25/2023 1157 Gross per 24 hour  Intake --  Output 2350 ml  Net -2350 ml   Filed Weights   12/23/23 0500 12/24/23 0500 12/25/23 0500  Weight: 62.2 kg 62.1 kg 59.8 kg     Exam General exam:well developed lady not in distress, s/p cortrak.  Respiratory system: Clear to auscultation. Respiratory effort normal. Cardiovascular system: S1 & S2 heard, RRR. No JVD Gastrointestinal system: Abdomen is nondistended, soft and nontender.  Central nervous system: Alert and oriented to person and place. Left sided weakness and neglect  Extremities: no pedal edema.  Skin: No rashes,  Psychiatry:no agitation.       Data Reviewed:  I have personally reviewed following labs and imaging studies   CBC Lab Results  Component Value Date   WBC 11.4 (H) 12/25/2023   RBC 3.50 (L) 12/25/2023   HGB 10.1 (L) 12/25/2023   HCT 30.8 (L) 12/25/2023   MCV 88.0 12/25/2023   MCH 28.9 12/25/2023   PLT 193 12/25/2023   MCHC 32.8 12/25/2023   RDW 18.3 (H) 12/25/2023   LYMPHSABS 1.8 12/25/2023   MONOABS 0.4 12/25/2023   EOSABS 0.1 12/25/2023   BASOSABS 0.0 12/25/2023     Last metabolic panel Lab Results  Component Value Date   NA 138 12/25/2023   K 3.7 12/25/2023   CL 108 12/25/2023   CO2 20 (L) 12/25/2023   BUN 24 (H) 12/25/2023   CREATININE 0.70 12/25/2023   GLUCOSE 95 12/25/2023   GFRNONAA >60 12/25/2023   GFRAA >60 11/10/2018   CALCIUM  8.9 12/25/2023   PHOS 4.0 12/12/2023   PROT 5.0 (L) 12/19/2023   ALBUMIN  2.5 (L) 12/19/2023   BILITOT 0.4 12/19/2023   ALKPHOS 57 12/19/2023   AST 78 (H) 12/19/2023   ALT 48 (H) 12/19/2023   ANIONGAP 10 12/25/2023    CBG  (last 3)  Recent Labs    12/25/23 0341 12/25/23 0758 12/25/23 1148  GLUCAP 93 100* 93      Coagulation Profile: No results for input(s): "INR", "PROTIME" in the last 168 hours.   Radiology Studies: IR GASTROSTOMY TUBE MOD SED Result Date: 12/25/2023 CLINICAL DATA:  Mental status changes, needs enteral feeding support EXAM: PERC PLACEMENT GASTROSTOMY (ATTEMPTED) FLUOROSCOPY: Radiation Exposure Index (as provided by the fluoroscopic device): 26.4 mGy air Kerma TECHNIQUE: As antibiotic prophylaxis, cefazolin  2 g was ordered pre-procedure and administered intravenously within one hour of incision. A safe percutaneous approach was confirmed from recent previous CT. A 5 French angiographic catheter was placed as orogastric tube. The upper abdomen was prepped with chlorhexidine , draped in usual sterile fashion, and infiltrated locally with 1% lidocaine . Intravenous Fentanyl  50mcg and Versed  1mg  were administered by RN during a total moderate (conscious) sedation time of 6 minutes; the patient's level of consciousness and physiological / cardiorespiratory status were monitored continuously by radiology RN under my direct supervision. Stomach was insufflated using air through the orogastric tube. Despite the findings confirmed on  previous CT, gaseous distended transverse colon is positioned anterior to the gastric body region, precluding safe percutaneous gastrostomy placement. The orogastric tube was removed and the procedure was terminated. COMPLICATIONS: COMPLICATIONS none IMPRESSION: 1. 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 Consider surgical consultation for gastrostomy placement. Electronically Signed   By: Nicoletta Barrier M.D.   On: 12/25/2023 07:37   DG Abd Portable 1V Result Date: 12/24/2023 CLINICAL DATA:  Feeding tube placement. EXAM: PORTABLE ABDOMEN - 1 VIEW COMPARISON:  Radiographs 12/16/2023.  CT 12/20/2023.  FINDINGS: 1322 hours. Feeding tube projects over the lower lumbar spine, consistent with location at the junction of the 2nd and 3rd portions of the duodenum. Similar mild gaseous distension of the small and large bowel consistent with a mild ileus. No osseous abnormalities are identified. IMPRESSION: Feeding tube tip projects at the junction of the 2nd and 3rd portions of the duodenum. Electronically Signed   By: Elmon Hagedorn M.D.   On: 12/24/2023 16:51        Feliciana Horn M.D. [facility] 12/25/2023, 12:51 PM  Available via Epic secure chat 7am-7pm After 7 pm, please refer to night coverage provider listed on amion.

## 2023-12-25 NOTE — Anesthesia Preprocedure Evaluation (Addendum)
 Anesthesia Evaluation  Patient identified by MRN, date of birth, ID band Patient awake    Reviewed: Allergy & Precautions, H&P , NPO status , Patient's Chart, lab work & pertinent test results  Airway Mallampati: II  TM Distance: >3 FB Neck ROM: Full    Dental no notable dental hx.    Pulmonary neg pulmonary ROS   Pulmonary exam normal breath sounds clear to auscultation       Cardiovascular hypertension, Pt. on medications + Past MI  Normal cardiovascular exam Rhythm:Regular Rate:Normal     Neuro/Psych Seizures -,   Neuromuscular disease CVA  negative psych ROS   GI/Hepatic negative GI ROS,,,  Endo/Other  negative endocrine ROS    Renal/GU Renal InsufficiencyRenal disease  negative genitourinary   Musculoskeletal negative musculoskeletal ROS (+)    Abdominal   Peds negative pediatric ROS (+)  Hematology negative hematology ROS (+)   Anesthesia Other Findings   Reproductive/Obstetrics negative OB ROS                             Anesthesia Physical Anesthesia Plan  ASA: 3  Anesthesia Plan: General   Post-op Pain Management:    Induction: Intravenous  PONV Risk Score and Plan: 3 and Ondansetron , Dexamethasone , Midazolam  and Treatment may vary due to age or medical condition  Airway Management Planned: Oral ETT  Additional Equipment:   Intra-op Plan:   Post-operative Plan: Extubation in OR  Informed Consent: I have reviewed the patients History and Physical, chart, labs and discussed the procedure including the risks, benefits and alternatives for the proposed anesthesia with the patient or authorized representative who has indicated his/her understanding and acceptance.     Dental advisory given  Plan Discussed with: CRNA  Anesthesia Plan Comments:        Anesthesia Quick Evaluation

## 2023-12-25 NOTE — Progress Notes (Signed)
 Physical Therapy Treatment Patient Details Name: Meredith Park MRN: [id number] DOB: 1971/03/25 Today's Date: 12/25/2023   History of Present Illness 53 yo female admitted 11/27/23 s/p seizure with acute encephalopathy, acute respiratory failure, septic shock secondary to aspiration PNA. LP 3/26, ETT 3/26-4/2, re-intubated 4/2. Extubated 4/7. PMH: HTN, HLD ETOH abuse hx, seizure    PT Comments  Patient resting in bed and agreeable to therapy visit. Pt required max assist +2 for bed mobility with assist to flex Lt LE and initiated roll/pivot towards Rt side of bed. Max assist for sitting balance at EOB with heavy posterior lean. Pt required mod-max +2 assist for sit<>stand and once standing posterior lean decreased and pt slightly more midline. Lt knee flexed but no full buckling. Pt continues to have Lt neglect and poor awareness to Lt side of body demonstrating improved ability to wash up Lt hand and LE with cues from OT while seated. Pt participated in standing activity with tic-tac-toe for Rt UE reaching to mirror and able to recall rules of game without cues/instruction. Mod +2 to maintain standing balance during reaching. EOS pt returned to supine in bed and adjusted to chair position. Patient will benefit from intensive inpatient follow-up therapy, >3 hours/day. Will continue to progress pt as able during stay.     If plan is discharge home, recommend the following: Two people to help with walking and/or transfers;Assistance with cooking/housework;Assist for transportation;Help with stairs or ramp for entrance;Supervision due to cognitive status   Can travel by private vehicle     No  Equipment Recommendations  Wheelchair cushion (measurements PT);Wheelchair (measurements PT);Hospital bed;Hoyer lift    Recommendations for Other Services Rehab consult     Precautions / Restrictions Precautions Precautions: Fall;Other (comment) Recall of Precautions/Restrictions:  Impaired Precaution/Restrictions Comments: rectal pouch,cortrak, R wrist restraint Restrictions Weight Bearing Restrictions Per Provider Order: No     Mobility  Bed Mobility Overal bed mobility: Needs Assistance Bed Mobility: Rolling, Sidelying to Sit, Sit to Sidelying Rolling: Max assist, Used rails, +2 for physical assistance Sidelying to sit: Max assist, +2 for physical assistance     Sit to sidelying: Total assist, +2 for physical assistance General bed mobility comments: using R UE to rail and visual cueing towards L side but overall max assist +2 required    Transfers Overall transfer level: Needs assistance Equipment used: 2 person hand held assist Transfers: Sit to/from Stand Sit to Stand: Max assist, +2 physical assistance           General transfer comment: max assist +2 to power up into standing but once upright able to sustain balance with mod assist +2.  Preference to posterior and L lateral lean.    Ambulation/Gait                   Stairs             Wheelchair Mobility     Tilt Bed    Modified Rankin (Stroke Patients Only)       Balance Overall balance assessment: Needs assistance Sitting-balance support: Feet supported Sitting balance-Leahy Scale: Poor Sitting balance - Comments: heavy L and posterior lean requires max assist (moments of mod assist) Postural control: Posterior lean, Left lateral lean Standing balance support: Bilateral upper extremity supported, During functional activity Standing balance-Leahy Scale: Poor Standing balance comment: mod assist +2 to maintain  Communication Communication Communication: Impaired Factors Affecting Communication: Difficulty expressing self;Reduced clarity of speech  Cognition Arousal: Alert Behavior During Therapy: Flat affect                             Following commands: Impaired Following commands impaired: Follows one step  commands inconsistently    Cueing Cueing Techniques: Verbal cues, Tactile cues, Visual cues  Exercises      General Comments        Pertinent Vitals/Pain Pain Assessment Pain Assessment: Faces Faces Pain Scale: Hurts a little bit Pain Location: generalized Pain Descriptors / Indicators: Discomfort Pain Intervention(s): Limited activity within patient's tolerance, Monitored during session, Repositioned    Home Living                          Prior Function            PT Goals (current goals can now be found in the care plan section) Acute Rehab PT Goals PT Goal Formulation: With patient/family Time For Goal Achievement: 01/04/24 Potential to Achieve Goals: Fair Progress towards PT goals: Progressing toward goals    Frequency    Min 2X/week      PT Plan      Co-evaluation PT/OT/SLP Co-Evaluation/Treatment: Yes Reason for Co-Treatment: Complexity of the patient's impairments (multi-system involvement);For patient/therapist safety;To address functional/ADL transfers PT goals addressed during session: Mobility/safety with mobility;Balance OT goals addressed during session: ADL's and self-care;Strengthening/ROM      AM-PAC PT "6 Clicks" Mobility   Outcome Measure  Help needed turning from your back to your side while in a flat bed without using bedrails?: A Lot Help needed moving from lying on your back to sitting on the side of a flat bed without using bedrails?: Total Help needed moving to and from a bed to a chair (including a wheelchair)?: Total Help needed standing up from a chair using your arms (e.g., wheelchair or bedside chair)?: A Lot Help needed to walk in hospital room?: Total Help needed climbing 3-5 steps with a railing? : Total 6 Click Score: 8    End of Session Equipment Utilized During Treatment: Gait belt Activity Tolerance: Patient tolerated treatment well Patient left: in bed;with call bell/phone within reach;with bed alarm  set Nurse Communication: Mobility status PT Visit Diagnosis: Muscle weakness (generalized) (M62.81);Difficulty in walking, not elsewhere classified (R26.2);Other symptoms and signs involving the nervous system (R29.898)     Time: 6213-0865 PT Time Calculation (min) (ACUTE ONLY): 32 min  Charges:    $Therapeutic Activity: 8-22 mins PT General Charges $$ ACUTE PT VISIT: 1 Visit                     Tish Forge, DPT Acute Rehabilitation Services Office [phone]  12/25/23 5:10 PM

## 2023-12-25 NOTE — Progress Notes (Signed)
 Occupational Therapy Treatment Patient Details Name: Meredith Park MRN: [id number] DOB: 1971/07/05 Today's Date: 12/25/2023   History of present illness 53 yo female admitted 11/27/23 s/p seizure with acute encephalopathy, acute respiratory failure, septic shock secondary to aspiration PNA. LP 3/26, ETT 3/26-4/2, re-intubated 4/2. Extubated 4/7. PMH: HTN, HLD ETOH abuse hx, seizure   OT comments  Patient agreeable to OT/PT session. Continues to require max assist +2 for bed mobility, max assist for sitting balance at EOB and max-mod assist +2 for standing.  She continues to have L neglect and poor awareness to L side of body, but able to initiate engagement in ADLs tasks today (although requires cueing to sustain attention and complete tasks). Min assist for grooming and max assist for LB bathing.  Continue to recommend <3hrs/day inpatient setting. Will follow acutely.       If plan is discharge home, recommend the following:  Two people to help with walking and/or transfers;Assistance with cooking/housework;Assist for transportation;Supervision due to cognitive status;Direct supervision/assist for medications management;Two people to help with bathing/dressing/bathroom;Direct supervision/assist for financial management   Equipment Recommendations  Wheelchair (measurements OT);Wheelchair cushion (measurements OT);Hospital bed;Hoyer lift;BSC/3in1    Recommendations for Other Services      Precautions / Restrictions Precautions Precautions: Fall;Other (comment) Recall of Precautions/Restrictions: Impaired Precaution/Restrictions Comments: rectal pouch,cortrak, R wrist restraint Restrictions Weight Bearing Restrictions Per Provider Order: No       Mobility Bed Mobility Overal bed mobility: Needs Assistance Bed Mobility: Rolling, Sidelying to Sit, Sit to Sidelying Rolling: Max assist, Used rails, +2 for physical assistance Sidelying to sit: Max assist, +2 for physical assistance      Sit to sidelying: Total assist, +2 for physical assistance General bed mobility comments: using R UE to rail and visual cueing towards L side but overall max assist +2 required    Transfers Overall transfer level: Needs assistance Equipment used: 2 person hand held assist Transfers: Sit to/from Stand Sit to Stand: Max assist, +2 physical assistance           General transfer comment: max assist +2 to power up into standing but once upright able to sustain balance with mod assist +2.  Preference to posterior and L lateral lean.     Balance Overall balance assessment: Needs assistance Sitting-balance support: Feet supported Sitting balance-Leahy Scale: Poor Sitting balance - Comments: heavy L and posterior lean requires max assist (moments of mod assist) Postural control: Posterior lean, Left lateral lean Standing balance support: Bilateral upper extremity supported, During functional activity Standing balance-Leahy Scale: Poor Standing balance comment: mod assist +2 to maintain                           ADL either performed or assessed with clinical judgement   ADL Overall ADL's : Needs assistance/impaired Eating/Feeding: NPO   Grooming: Minimal assistance;Sitting Grooming Details (indicate cue type and reason): pt able to initate washing hands today, but poor carryvoer and attention to task.  Requires min assist to complete.     Lower Body Bathing: Maximal assistance;Sitting/lateral leans Lower Body Bathing Details (indicate cue type and reason): EOB washing LB with max assist for sitting balance and completion of task                       General ADL Comments: PROM L UE from shoulder to hand    Extremity/Trunk Assessment Upper Extremity Assessment Upper Extremity Assessment: LUE deficits/detail LUE Deficits / Details:  flaccid but tone increasing within flexor synergy.  resting hand splint removed during session.  Continue to encourage elevation  of UE. LUE: Subluxation noted LUE Sensation: decreased light touch;decreased proprioception LUE Coordination: decreased fine motor;decreased gross motor            Vision   Additional Comments: continues to have R gaze preference, able to scan towards L side with maximal multimodal cueing but inability to sustain   Perception Perception Perception: Impaired Preception Impairment Details: Inattention/Neglect;Spatial orientation Perception-Other Comments: L neglect   Praxis     Communication Communication Communication: Impaired Factors Affecting Communication: Difficulty expressing self;Reduced clarity of speech   Cognition Arousal: Alert Behavior During Therapy: Flat affect Cognition: Cognition impaired Difficult to assess due to: Impaired communication Orientation impairments: Time, Situation Awareness: Intellectual awareness impaired, Online awareness impaired Memory impairment (select all impairments): Short-term memory, Working memory, Non-declarative long-term memory Attention impairment (select first level of impairment): Focused attention Executive functioning impairment (select all impairments): Initiation, Organization, Sequencing, Problem solving, Reasoning OT - Cognition Comments: pt verbalizing more today, whispering throughout session but unclear at times.  She follows some simple commnads with increase time and multimodal cueing (decreased cueing on R side vs L side).   Poor attention, reoriented to time and reports "you are lying to me" but able to state she is in the hospital.                 Following commands: Impaired Following commands impaired: Follows one step commands inconsistently      Cueing   Cueing Techniques: Verbal cues, Tactile cues, Visual cues  Exercises      Shoulder Instructions       General Comments      Pertinent Vitals/ Pain       Pain Assessment Pain Assessment: Faces Faces Pain Scale: Hurts a little bit Pain  Location: generalized Pain Descriptors / Indicators: Discomfort Pain Intervention(s): Monitored during session, Repositioned, Limited activity within patient's tolerance  Home Living                                          Prior Functioning/Environment              Frequency  Min 2X/week        Progress Toward Goals  OT Goals(current goals can now be found in the care plan section)  Progress towards OT goals: Progressing toward goals  Acute Rehab OT Goals Patient Stated Goal: get better OT Goal Formulation: With patient Time For Goal Achievement: 01/01/24 Potential to Achieve Goals: Fair  Plan      Co-evaluation    PT/OT/SLP Co-Evaluation/Treatment: Yes Reason for Co-Treatment: Complexity of the patient's impairments (multi-system involvement);For patient/therapist safety;To address functional/ADL transfers   OT goals addressed during session: ADL's and self-care;Strengthening/ROM      AM-PAC OT "6 Clicks" Daily Activity     Outcome Measure   Help from another person eating meals?: Total Help from another person taking care of personal grooming?: A Lot Help from another person toileting, which includes using toliet, bedpan, or urinal?: Total Help from another person bathing (including washing, rinsing, drying)?: A Lot Help from another person to put on and taking off regular upper body clothing?: A Lot Help from another person to put on and taking off regular lower body clothing?: Total 6 Click Score: 9    End of Session Equipment Utilized During  Treatment: Gait belt  OT Visit Diagnosis: Unsteadiness on feet (R26.81);Muscle weakness (generalized) (M62.81);Other symptoms and signs involving cognitive function;Other symptoms and signs involving the nervous system (R29.898);Other abnormalities of gait and mobility (R26.89);Hemiplegia and hemiparesis Hemiplegia - Right/Left: Left Hemiplegia - dominant/non-dominant: Non-Dominant Hemiplegia -  caused by: Unspecified   Activity Tolerance Patient tolerated treatment well   Patient Left in bed;with call bell/phone within reach;with bed alarm set;with restraints reapplied   Nurse Communication Mobility status        Time: 1010-1042 OT Time Calculation (min): 32 min  Charges: OT General Charges $OT Visit: 1 Visit OT Treatments $Self Care/Home Management : 8-22 mins  Bary Boss, OT Acute Rehabilitation Services Office [phone] Secure Chat Preferred    Fredrich Jefferson 12/25/2023, 1:01 PM

## 2023-12-25 NOTE — Anesthesia Procedure Notes (Signed)
 Procedure Name: Intubation Date/Time: 12/25/2023 2:26 PM  Performed by: Leandro Proffer, CRNAPre-anesthesia Checklist: Patient identified, Emergency Drugs available, Suction available and Patient being monitored Patient Re-evaluated:Patient Re-evaluated prior to induction Oxygen Delivery Method: Circle system utilized Preoxygenation: Pre-oxygenation with 100% oxygen Induction Type: IV induction Ventilation: Mask ventilation without difficulty Laryngoscope Size: Mac and 3 Grade View: Grade II Tube type: Oral Tube size: 7.0 mm Number of attempts: 1 Airway Equipment and Method: Stylet and Oral airway Placement Confirmation: ETT inserted through vocal cords under direct vision, positive ETCO2 and breath sounds checked- equal and bilateral Secured at: 21 cm Tube secured with: Tape Dental Injury: Teeth and Oropharynx as per pre-operative assessment

## 2023-12-26 ENCOUNTER — Encounter (HOSPITAL_COMMUNITY): Payer: Self-pay | Admitting: Surgery

## 2023-12-26 DIAGNOSIS — R4182 Altered mental status, unspecified: Secondary | ICD-10-CM | POA: Diagnosis not present

## 2023-12-26 DIAGNOSIS — N179 Acute kidney failure, unspecified: Secondary | ICD-10-CM | POA: Diagnosis not present

## 2023-12-26 DIAGNOSIS — G40901 Epilepsy, unspecified, not intractable, with status epilepticus: Secondary | ICD-10-CM | POA: Diagnosis not present

## 2023-12-26 LAB — CBC WITH DIFFERENTIAL/PLATELET
Abs Immature Granulocytes: 0.07 10*3/uL (ref 0.00–0.07)
Basophils Absolute: 0 10*3/uL (ref 0.0–0.1)
Basophils Relative: 0 %
Eosinophils Absolute: 0.1 10*3/uL (ref 0.0–0.5)
Eosinophils Relative: 1 %
HCT: 26.6 % — ABNORMAL LOW (ref 36.0–46.0)
Hemoglobin: 8.3 g/dL — ABNORMAL LOW (ref 12.0–15.0)
Immature Granulocytes: 1 %
Lymphocytes Relative: 15 %
Lymphs Abs: 1.5 10*3/uL (ref 0.7–4.0)
MCH: 27.9 pg (ref 26.0–34.0)
MCHC: 31.2 g/dL (ref 30.0–36.0)
MCV: 89.3 fL (ref 80.0–100.0)
Monocytes Absolute: 0.4 10*3/uL (ref 0.1–1.0)
Monocytes Relative: 4 %
Neutro Abs: 7.9 10*3/uL — ABNORMAL HIGH (ref 1.7–7.7)
Neutrophils Relative %: 79 %
Platelets: 135 10*3/uL — ABNORMAL LOW (ref 150–400)
RBC: 2.98 MIL/uL — ABNORMAL LOW (ref 3.87–5.11)
RDW: 18.1 % — ABNORMAL HIGH (ref 11.5–15.5)
WBC: 10 10*3/uL (ref 4.0–10.5)
nRBC: 0 % (ref 0.0–0.2)

## 2023-12-26 LAB — GLUCOSE, CAPILLARY
Glucose-Capillary: 102 mg/dL — ABNORMAL HIGH (ref 70–99)
Glucose-Capillary: 117 mg/dL — ABNORMAL HIGH (ref 70–99)
Glucose-Capillary: 161 mg/dL — ABNORMAL HIGH (ref 70–99)
Glucose-Capillary: 163 mg/dL — ABNORMAL HIGH (ref 70–99)
Glucose-Capillary: 96 mg/dL (ref 70–99)
Glucose-Capillary: 99 mg/dL (ref 70–99)

## 2023-12-26 LAB — COMPREHENSIVE METABOLIC PANEL WITH GFR
ALT: 30 U/L (ref 0–44)
AST: 70 U/L — ABNORMAL HIGH (ref 15–41)
Albumin: 2.9 g/dL — ABNORMAL LOW (ref 3.5–5.0)
Alkaline Phosphatase: 55 U/L (ref 38–126)
Anion gap: 9 (ref 5–15)
BUN: 19 mg/dL (ref 6–20)
CO2: 18 mmol/L — ABNORMAL LOW (ref 22–32)
Calcium: 8.7 mg/dL — ABNORMAL LOW (ref 8.9–10.3)
Chloride: 109 mmol/L (ref 98–111)
Creatinine, Ser: 0.73 mg/dL (ref 0.44–1.00)
GFR, Estimated: >60 mL/min (ref >60)
Glucose, Bld: 133 mg/dL — ABNORMAL HIGH (ref 70–99)
Potassium: 3.6 mmol/L (ref 3.5–5.1)
Sodium: 136 mmol/L (ref 135–145)
Total Bilirubin: 0.5 mg/dL (ref 0.0–1.2)
Total Protein: 4.8 g/dL — ABNORMAL LOW (ref 6.5–8.1)

## 2023-12-26 MED ORDER — MORPHINE SULFATE (CONCENTRATE) 10 MG /0.5 ML PO SOLN: 5.0000 mg | ORAL | Status: DC | PRN

## 2023-12-26 MED ORDER — ACETAMINOPHEN 160 MG/5ML PO SOLN
1000.0000 mg | Freq: Four times a day (QID) | ORAL | Status: DC | PRN
  Administered 2023-12-26 – 2023-12-29 (×2): 1000 mg
  Filled 2023-12-26 (×2): qty 40.6

## 2023-12-26 NOTE — Progress Notes (Signed)
 [facility]                                                                              Meredith Park, is a 53 y.o. female, DOB - May 01, 1971, ZOX:[id number] Admit date - 11/27/2023    Outpatient Primary MD for the patient is Fulbright, [place]  E, PA-C  LOS - 29  days  Chief Complaint  Patient presents with   Seizures       Brief summary   53 year old female with history of alcohol abuse, HLD, seizures, hypertension, abstinence from alcohol for last 1 year, found down beside her bed by her family members, was noted to be shaking and unresponsive.  EMS was called and was found to be in generalized tonic-clonic seizures, treated with Versed  with cessation of seizures, remained minimally responsive in the ED.  Hypoxemic requiring 10 L O2 and was intubated to protect airway and admitted to the ICU. Significant events as below. 3/25 admit to ICU.  Hypoxemic and unresponsive.  Intubated 3/26 LP performed. Increasing pressor requirement 3/27 Increased vasopressor requirement with worsening MOD including AKI, shock liver. Echo with stress CM. 3/29 Improved vasopressor requirements 4/2 central line d/c, extubated (had cuff leak), reintubated for stridor & secretions several hours later 4/5 CTH w/progression of unihemispheric edema, thought more c/w seizure changes 4/6 LTM, steroids, 4/7 extubated, intermittently on predex for agitation, LTM d/c'd, tmax 101.6/ WBC stable 4/10 on room air.  Transferred to medical floor.  Core track feeding. 4/11 full of secretions unable to clear , requiring non- rebreather, transferred back to ICU 4/14: Transferred out of ICU, [facility] 4/17 patient working with SLP making progress. PEG placement requested.  4/18 MRI brain reviewed and neurology signed off. 4/21 IR unable to place the PEG due to colon / safe window. Gen surgery consulted. 4/22 surgical PEG placement today. Overnight patient had some blood mixed with stool in flexi seal positive for  occult blood. GI consulted for recommendations.    Assessment & Plan      Status epilepticus (HCC) in the setting of history of seizure disorder - Acute encephalopathy secondary to status epilepticus - Right cerebral hemisphere edema consistent with postictal state, still has left-sided weakness -Neurology following suspect this is postsurgical versus potential autoimmune/paraneoplastic/vasculitis. -CT of the chest abdomen and pelvis without any signs of malignancy -Neurology recommends to continue with Topamax  100 mg daily, lamotrigine  250 mg BID and DC Vimpat  and  to continue with Prednisone  taper by 10 mg every week. -Currently she is on prednisone  40 mg starting from 4/22. - Repeat MRI brain showed recent punctate infarcts in the left temporal subcortical white matter and superior right cerebellum. Evolutionary changes throughout the right cerebral and left cerebellar hemispheres as described above most compatible with evolving injury from recent status epilepticus. -  on aspirin  81 mg daily and lipitor 40 mg daily for secondary stroke prevention.       Acute hypoxic respiratory failure secondary to aspiration versus volume overload -Patient was initially intubated for status epilepticus and airway protection, subsequently reintubated due to aspiration versus volume overload, has been extubated - O2 sats 100% on room air  - Continue  PT OT, I-S, SLP - completed course of antibiotics.  - echocardiogram showed LVEF of 40 to 45%, left ventricle demonstrates regional wall abnormalities.      Dysphagia/ Nutrition -Patient was placed on core track, failed SLP evaluation -Palliative medicine consulted for goals of care/PEG tubes:  Continue with full scope treatments with aggressive life prolonging interventions including PEG.  -IR unable to place PEG due to no safe window.  - Surgery consulted and underwent EGD and PEG tube placement on 4/22  - Tube feeds started -Per GI today, no  intervention is needed as endoscopy was completed by surgery on 4/22 and was unremarkable.       New onset cardiomyopathy, elevated troponin likely due to demand ischemia Hypertension - 2D echo 11/28/2023 showed EF of 40 to 45%, G1 DD, regional WMA -Seen by cardiology, recommending conservative management for now.  - Continue Coreg , cardiology recommended repeat echo in near future and if she has meaningful neurological recovery, please reach out to cardiology for further work up of new onset cardiomyopathy.  - currently on coreg  and losartan .        Acute Kidney injury secondary to rhabdomyolysis, ATN, metabolic acidosis Creatinine is back to baseline,  Bicarb has improved to 20.    Hypernatremia.  Resolved.      Anemia of acute illness/ guaiac positive stools:  Per GI, no intervention needed as endoscopy was completed by surgery on 4/22 and was unremarkable Continue PPI    Acute transaminitis Improving     Hyperglycemia probably secondary to steroids and tube feeds CBG (last 3)  Recent Labs    12/26/23 0353 12/26/23 0730 12/26/23 1121  GLUCAP 96 99 117*   Continue sliding scale insulin      Leukocytosis from steroids.  Improving.    Hypotension on 4/21  From iv fentanyl  and versad Ordered fluid bolus with RL,  BP parameters improved.   Severe protein calorie malnutrition Nutrition Problem: Severe Malnutrition Etiology: social / environmental circumstances Signs/Symptoms: severe muscle depletion, percent weight loss  Interventions: Prostat, Tube feeding, MVI  Estimated body mass index is 24.84 kg/m as calculated from the following:   Height as of this encounter: 5\' 2"  (1.575 m).   Weight as of this encounter: 61.6 kg.  Code Status: Full code DVT Prophylaxis:  heparin  injection 5,000 Units Start: 12/04/23 0930 SCDs Start: 11/27/23 2310   Level of Care: Level of care: Progressive Family Communication: Updated patient Disposition Plan:      Remains  inpatient appropriate:      Procedures:  PEG tube placement on 4/22 by surgery  Consultants:   General Surgery Neurology IR  Antimicrobials:   Anti-infectives (From admission, onward)    Start     Dose/Rate Route Frequency Ordered Stop   12/24/23 0910  ceFAZolin  (ANCEF ) IVPB 2g/100 mL premix        over 30 Minutes Intravenous Continuous PRN 12/24/23 0911 12/24/23 0910   12/14/23 1245  Ampicillin -Sulbactam (UNASYN ) 3 g in sodium chloride  0.9 % 100 mL IVPB        3 g 200 mL/hr over 30 Minutes Intravenous Every 6 hours 12/14/23 1154 12/19/23 0640   12/04/23 0930  piperacillin -tazobactam (ZOSYN ) IVPB 3.375 g  Status:  Discontinued        3.375 g 12.5 mL/hr over 240 Minutes Intravenous Every 8 hours 12/04/23 0839 12/10/23 1048   11/30/23 1000  cefTRIAXone  (ROCEPHIN ) 2 g in sodium chloride  0.9 % 100 mL IVPB  Status:  Discontinued  2 g 200 mL/hr over 30 Minutes Intravenous Every 24 hours 11/29/23 1006 12/04/23 0830   11/29/23 1000  vancomycin  (VANCOREADY) IVPB 750 mg/150 mL  Status:  Discontinued        750 mg 150 mL/hr over 60 Minutes Intravenous Every 24 hours 11/28/23 0245 11/29/23 1006   11/28/23 2200  acyclovir  (ZOVIRAX ) 630 mg in dextrose  5 % 100 mL IVPB  Status:  Discontinued        10 mg/kg  63 kg 112.6 mL/hr over 60 Minutes Intravenous Every 24 hours 11/28/23 0247 11/29/23 0836   11/28/23 1000  ampicillin  (OMNIPEN) 2 g in sodium chloride  0.9 % 100 mL IVPB  Status:  Discontinued        2 g 300 mL/hr over 20 Minutes Intravenous Every 8 hours 11/28/23 0249 11/29/23 1006   11/28/23 0115  vancomycin  (VANCOREADY) IVPB 2000 mg/400 mL        2,000 mg 200 mL/hr over 120 Minutes Intravenous  Once 11/28/23 0028 11/28/23 0308   11/28/23 0115  cefTRIAXone  (ROCEPHIN ) 2 g in sodium chloride  0.9 % 100 mL IVPB  Status:  Discontinued        2 g 200 mL/hr over 30 Minutes Intravenous Every 12 hours 11/28/23 0028 11/29/23 1006   11/28/23 0115  ampicillin  (OMNIPEN) 2 g in sodium chloride   0.9 % 100 mL IVPB        2 g 300 mL/hr over 20 Minutes Intravenous  Once 11/28/23 0028 11/28/23 0129   11/28/23 0100  acyclovir  (ZOVIRAX ) 700 mg in dextrose  5 % 100 mL IVPB        700 mg 114 mL/hr over 60 Minutes Intravenous  Once 11/28/23 0029 11/28/23 0301          Medications  acetaminophen  (TYLENOL ) oral liquid 160 mg/5 mL  650 mg Per Tube Q6H   [START ON 12/27/2023] aspirin   81 mg Per Tube Daily   atorvastatin   40 mg Per Tube Daily   carvedilol   3.125 mg Per Tube BID WC   chlorhexidine   15 mL Mouth/Throat NOW   Chlorhexidine  Gluconate Cloth  6 each Topical Daily   feeding supplement (PROSource TF20)  60 mL Per Tube Daily   fiber supplement (BANATROL TF)  60 mL Per Tube BID   folic acid   1 mg Per Tube Daily   free water   200 mL Per Tube Q4H   heparin  injection (subcutaneous)  5,000 Units Subcutaneous Q8H   insulin  aspart  0-9 Units Subcutaneous Q4H   lamoTRIgine   250 mg Per Tube BID   losartan   25 mg Oral Daily   multivitamin with minerals  1 tablet Per Tube Daily   nystatin  cream   Topical BID   mouth rinse  15 mL Mouth Rinse 4 times per day   pantoprazole  (PROTONIX ) IV  40 mg Intravenous Q24H   predniSONE   40 mg Oral Q breakfast   Followed by   Cecily Cohen ON 01/01/2024] predniSONE   30 mg Oral Q breakfast   Followed by   Cecily Cohen ON 01/08/2024] predniSONE   20 mg Oral Q breakfast   Followed by   Cecily Cohen ON 01/15/2024] predniSONE   10 mg Oral Q breakfast   QUEtiapine   50 mg Per Tube QHS   sertraline   25 mg Per Tube QHS   thiamine   100 mg Per Tube Daily   topiramate   100 mg Per Tube QHS      Subjective:   Beyza Bellino was seen and examined today.  No acute issues, tube feeds  restarted, tolerating so far.  Much more alert and oriented however still has left-sided weakness, speaking softly.    Objective:   Vitals:   12/26/23 0353 12/26/23 0500 12/26/23 0710 12/26/23 1123  BP: 100/84  129/84 107/80  Pulse: 94  88 89  Resp: 18  18 20   Temp: 97.7 F (36.5 C)  98.3 F (36.8  C) 99.2 F (37.3 C)  TempSrc:   Oral Oral  SpO2: 100%  100% 99%  Weight:  61.6 kg    Height:        Intake/Output Summary (Last 24 hours) at 12/26/2023 1226 Last data filed at 12/26/2023 1200 Gross per 24 hour  Intake 519.5 ml  Output 760 ml  Net -240.5 ml     Wt Readings from Last 3 Encounters:  12/26/23 61.6 kg  11/23/22 105.9 kg  11/22/22 105.9 kg     Exam General: Alert and oriented x 3, NAD Cardiovascular: S1 S2 auscultated,  RRR Respiratory: Clear to auscultation bilaterally, no wheezing Gastrointestinal: Soft, nontender, nondistended, + bowel sounds Ext: no pedal edema bilaterally Neuro: left-sided hemiplegia, neglect Psych: following commands, speaking softly and whispering voice    Data Reviewed:  I have personally reviewed following labs    CBC Lab Results  Component Value Date   WBC 10.0 12/26/2023   RBC 2.98 (L) 12/26/2023   HGB 8.3 (L) 12/26/2023   HCT 26.6 (L) 12/26/2023   MCV 89.3 12/26/2023   MCH 27.9 12/26/2023   PLT 135 (L) 12/26/2023   MCHC 31.2 12/26/2023   RDW 18.1 (H) 12/26/2023   LYMPHSABS 1.5 12/26/2023   MONOABS 0.4 12/26/2023   EOSABS 0.1 12/26/2023   BASOSABS 0.0 12/26/2023     Last metabolic panel Lab Results  Component Value Date   NA 136 12/26/2023   K 3.6 12/26/2023   CL 109 12/26/2023   CO2 18 (L) 12/26/2023   BUN 19 12/26/2023   CREATININE 0.73 12/26/2023   GLUCOSE 133 (H) 12/26/2023   GFRNONAA >60 12/26/2023   GFRAA >60 11/10/2018   CALCIUM  8.7 (L) 12/26/2023   PHOS 4.0 12/12/2023   PROT 4.8 (L) 12/26/2023   ALBUMIN  2.9 (L) 12/26/2023   BILITOT 0.5 12/26/2023   ALKPHOS 55 12/26/2023   AST 70 (H) 12/26/2023   ALT 30 12/26/2023   ANIONGAP 9 12/26/2023    CBG (last 3)  Recent Labs    12/26/23 0353 12/26/23 0730 12/26/23 1121  GLUCAP 96 99 117*      Coagulation Profile: No results for input(s): "INR", "PROTIME" in the last 168 hours.   Radiology Studies: I have personally reviewed the imaging  studies  DG Abd Portable 1V Result Date: 12/24/2023 CLINICAL DATA:  Feeding tube placement. EXAM: PORTABLE ABDOMEN - 1 VIEW COMPARISON:  Radiographs 12/16/2023.  CT 12/20/2023. FINDINGS: 1322 hours. Feeding tube projects over the lower lumbar spine, consistent with location at the junction of the 2nd and 3rd portions of the duodenum. Similar mild gaseous distension of the small and large bowel consistent with a mild ileus. No osseous abnormalities are identified. IMPRESSION: Feeding tube tip projects at the junction of the 2nd and 3rd portions of the duodenum. Electronically Signed   By: Elmon Hagedorn M.D.   On: 12/24/2023 16:51       Jyllian Haynie M.D. [facility] 12/26/2023, 12:26 PM  Available via Epic secure chat 7am-7pm After 7 pm, please refer to night coverage provider listed on amion.

## 2023-12-26 NOTE — Progress Notes (Signed)
 [facility] Progress Note  1 Day Post-Op  Subjective: CC:  Answering questions appropriately but with hypophonic, repetitive speech. Keeps saying "what's good about it".  +BMs Tube feeds running   Objective: Vital signs in last 24 hours: Temp:  [97.6 F (36.4 C)-98.6 F (37 C)] 98.3 F (36.8 C) (04/23 0710) Pulse Rate:  [56-94] 88 (04/23 0710) Resp:  [6-20] 18 (04/23 0710) BP: (80-129)/(58-91) 129/84 (04/23 0710) SpO2:  [96 %-100 %] 100 % (04/23 0710) Weight:  [61.6 kg] 61.6 kg (04/23 0500) Last BM Date : 12/25/23 (Flexiseal in place)  Intake/Output from previous day: 04/22 0701 - 04/23 0700 In: 119.5 [I.V.:12.5; IV Piggyback:107] Out: 1310 [Urine:1310] Intake/Output this shift: No intake/output data recorded.  PE: Gen:  Alert, NAD Card:  Regular rate and rhythm Pulm:  Normal effort ORA Abd: Abdominal binder in place - opened to examine abdomen. Abdomen is Soft, non-tender, non-distended, PEG in place without active bleeding or drainage around the tube, no cellulitis, TF running.  Skin: warm and dry  Lab Results:  Recent Labs    12/25/23 0941  WBC 11.4*  HGB 10.1*  HCT 30.8*  PLT 193   BMET Recent Labs    12/25/23 0941  NA 138  K 3.7  CL 108  CO2 20*  GLUCOSE 95  BUN 24*  CREATININE 0.70  CALCIUM  8.9   PT/INR No results for input(s): "LABPROT", "INR" in the last 72 hours. CMP     Component Value Date/Time   NA 138 12/25/2023 0941   NA 144 10/10/2021 1300   K 3.7 12/25/2023 0941   CL 108 12/25/2023 0941   CO2 20 (L) 12/25/2023 0941   GLUCOSE 95 12/25/2023 0941   BUN 24 (H) 12/25/2023 0941   BUN 12 10/10/2021 1300   CREATININE 0.70 12/25/2023 0941   CALCIUM  8.9 12/25/2023 0941   PROT 5.0 (L) 12/19/2023 0607   ALBUMIN  2.5 (L) 12/19/2023 0607   AST 78 (H) 12/19/2023 0607   ALT 48 (H) 12/19/2023 0607   ALKPHOS 57 12/19/2023 0607   BILITOT 0.4 12/19/2023 0607   GFRNONAA >60 12/25/2023 0941   GFRAA >60 11/10/2018 1016   Lipase      Component Value Date/Time   LIPASE 26 11/06/2022 1841       Studies/Results: IR GASTROSTOMY TUBE MOD SED Result Date: 12/25/2023 CLINICAL DATA:  Mental status changes, needs enteral feeding support EXAM: PERC PLACEMENT GASTROSTOMY (ATTEMPTED) FLUOROSCOPY: Radiation Exposure Index (as provided by the fluoroscopic device): 26.4 mGy air Kerma TECHNIQUE: As antibiotic prophylaxis, cefazolin  2 g was ordered pre-procedure and administered intravenously within one hour of incision. A safe percutaneous approach was confirmed from recent previous CT. A 5 French angiographic catheter was placed as orogastric tube. The upper abdomen was prepped with chlorhexidine , draped in usual sterile fashion, and infiltrated locally with 1% lidocaine . Intravenous Fentanyl  50mcg and Versed  1mg  were administered by RN during a total moderate (conscious) sedation time of 6 minutes; the patient's level of consciousness and physiological / cardiorespiratory status were monitored continuously by radiology RN under my direct supervision. Stomach was insufflated using air through the orogastric tube. Despite the findings confirmed on previous CT, gaseous distended transverse colon is positioned anterior to the gastric body region, precluding safe percutaneous gastrostomy placement. The orogastric tube was removed and the procedure was terminated. COMPLICATIONS: COMPLICATIONS none IMPRESSION: 1. 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 Consider surgical consultation for gastrostomy placement. Electronically Signed   By: Baker Bon  Marlena Sima M.D.   On: 12/25/2023 07:37   DG Abd Portable 1V Result Date: 12/24/2023 CLINICAL DATA:  Feeding tube placement. EXAM: PORTABLE ABDOMEN - 1 VIEW COMPARISON:  Radiographs 12/16/2023.  CT 12/20/2023. FINDINGS: 1322 hours. Feeding tube projects over the lower lumbar spine, consistent with location at the junction of the 2nd  and 3rd portions of the duodenum. Similar mild gaseous distension of the small and large bowel consistent with a mild ileus. No osseous abnormalities are identified. IMPRESSION: Feeding tube tip projects at the junction of the 2nd and 3rd portions of the duodenum. Electronically Signed   By: Elmon Hagedorn M.D.   On: 12/24/2023 16:51    Anti-infectives: Anti-infectives (From admission, onward)    Start     Dose/Rate Route Frequency Ordered Stop   12/24/23 0910  ceFAZolin  (ANCEF ) IVPB 2g/100 mL premix        over 30 Minutes Intravenous Continuous PRN 12/24/23 0911 12/24/23 0910   12/14/23 1245  Ampicillin -Sulbactam (UNASYN ) 3 g in sodium chloride  0.9 % 100 mL IVPB        3 g 200 mL/hr over 30 Minutes Intravenous Every 6 hours 12/14/23 1154 12/19/23 0640   12/04/23 0930  piperacillin -tazobactam (ZOSYN ) IVPB 3.375 g  Status:  Discontinued        3.375 g 12.5 mL/hr over 240 Minutes Intravenous Every 8 hours 12/04/23 0839 12/10/23 1048   11/30/23 1000  cefTRIAXone  (ROCEPHIN ) 2 g in sodium chloride  0.9 % 100 mL IVPB  Status:  Discontinued        2 g 200 mL/hr over 30 Minutes Intravenous Every 24 hours 11/29/23 1006 12/04/23 0830   11/29/23 1000  vancomycin  (VANCOREADY) IVPB 750 mg/150 mL  Status:  Discontinued        750 mg 150 mL/hr over 60 Minutes Intravenous Every 24 hours 11/28/23 0245 11/29/23 1006   11/28/23 2200  acyclovir  (ZOVIRAX ) 630 mg in dextrose  5 % 100 mL IVPB  Status:  Discontinued        10 mg/kg  63 kg 112.6 mL/hr over 60 Minutes Intravenous Every 24 hours 11/28/23 0247 11/29/23 0836   11/28/23 1000  ampicillin  (OMNIPEN) 2 g in sodium chloride  0.9 % 100 mL IVPB  Status:  Discontinued        2 g 300 mL/hr over 20 Minutes Intravenous Every 8 hours 11/28/23 0249 11/29/23 1006   11/28/23 0115  vancomycin  (VANCOREADY) IVPB 2000 mg/400 mL        2,000 mg 200 mL/hr over 120 Minutes Intravenous  Once 11/28/23 0028 11/28/23 0308   11/28/23 0115  cefTRIAXone  (ROCEPHIN ) 2 g in sodium  chloride 0.9 % 100 mL IVPB  Status:  Discontinued        2 g 200 mL/hr over 30 Minutes Intravenous Every 12 hours 11/28/23 0028 11/29/23 1006   11/28/23 0115  ampicillin  (OMNIPEN) 2 g in sodium chloride  0.9 % 100 mL IVPB        2 g 300 mL/hr over 20 Minutes Intravenous  Once 11/28/23 0028 11/28/23 0129   11/28/23 0100  acyclovir  (ZOVIRAX ) 700 mg in dextrose  5 % 100 mL IVPB        700 mg 114 mL/hr over 60 Minutes Intravenous  Once 11/28/23 0029 11/28/23 0301        Assessment/Plan Dysphagia, s/p status epilepticus S/p PEG tube placement 4/22 Dr. Aniceto Barley  No complications s/p above procedure. Trauma will sign off. Please call as needed. Follow up in the office as needed in 6+ weeks for  PEG removal if she is able to progress to no longer need the feeding tube.     LOS: 29 days   I reviewed nursing notes, Consultant neurology notes, hospitalist notes, last 24 h vitals and pain scores, last 48 h intake and output, last 24 h labs and trends, and last 24 h imaging results.  This care required straight-forward level of medical decision making.   Michial Akin, PA-C [facility] Please see Amion for pager number during day hours 7:00am-4:30pm

## 2023-12-26 NOTE — TOC Progression Note (Signed)
 Transition of Care [facility]) - Progression Note    Patient Details  Name: Meredith Park MRN: [id number] Date of Birth: [birth year]  [facility]) CM/SW Contact  Burtis Imhoff Isaias March, Student-Social Work Phone Number: 12/26/2023, 1:39 PM  Clinical Narrative:  MSW Student reached out patients mother, [name], over the phone. MSW Student gave SNF options to mother over the phone. [name] indicated that she will be viewing SNF placements. [name] stated that she is choosing between [facility] or [facility]. [facility] has accepted and is still pending for [facility]. [name] is waiting to hear back from [facility] before making a decision. MSW Student to follow.      Expected Discharge Plan: Skilled Nursing Facility Barriers to Discharge: Continued Medical Work up, English as a second language teacher, SNF Pending bed offer  Expected Discharge Plan and Services In-house Referral: Clinical Social Work   Post Acute Care Choice: Skilled Nursing Facility Living arrangements for the past 2 months: Single Family Home                                       Social Determinants of Health (SDOH) Interventions SDOH Screenings   Food Insecurity: Low Risk  (07/18/2023)   Received from [facility]  Housing: Low Risk  (07/18/2023)   Received from [facility]  Transportation Needs: No Transportation Needs (07/18/2023)   Received from [facility]  Utilities: Low Risk  (07/18/2023)   Received from [facility]  Tobacco Use: Low Risk  (12/25/2023)  Recent Concern: Tobacco Use - Medium Risk (11/05/2023)   Received from [facility]    Readmission Risk Interventions    12/12/2023    3:50 PM  Readmission Risk Prevention Plan  Transportation Screening Complete  Medication Review (RN Care Manager) Complete  PCP or Specialist appointment within 3-5 days of discharge Complete  HRI or Home Care Consult Complete  SW Recovery Care/Counseling Consult Complete  Palliative Care Screening Not  Applicable  Skilled Nursing Facility Complete

## 2023-12-27 DIAGNOSIS — R4182 Altered mental status, unspecified: Secondary | ICD-10-CM | POA: Diagnosis not present

## 2023-12-27 DIAGNOSIS — G40901 Epilepsy, unspecified, not intractable, with status epilepticus: Secondary | ICD-10-CM | POA: Diagnosis not present

## 2023-12-27 DIAGNOSIS — N179 Acute kidney failure, unspecified: Secondary | ICD-10-CM | POA: Diagnosis not present

## 2023-12-27 LAB — GLUCOSE, CAPILLARY
Glucose-Capillary: 134 mg/dL — ABNORMAL HIGH (ref 70–99)
Glucose-Capillary: 138 mg/dL — ABNORMAL HIGH (ref 70–99)
Glucose-Capillary: 151 mg/dL — ABNORMAL HIGH (ref 70–99)
Glucose-Capillary: 175 mg/dL — ABNORMAL HIGH (ref 70–99)
Glucose-Capillary: 183 mg/dL — ABNORMAL HIGH (ref 70–99)
Glucose-Capillary: 214 mg/dL — ABNORMAL HIGH (ref 70–99)

## 2023-12-27 MED ORDER — PREDNISONE 20 MG PO TABS: 20.0000 mg | ORAL_TABLET | Freq: Every day | ORAL | Status: DC

## 2023-12-27 MED ORDER — DOCUSATE SODIUM 50 MG/5ML PO LIQD: 100.0000 mg | Freq: Two times a day (BID) | ORAL | Status: DC | PRN

## 2023-12-27 MED ORDER — PREDNISONE 20 MG PO TABS
40.0000 mg | ORAL_TABLET | Freq: Every day | ORAL | Status: AC
  Administered 2023-12-28 – 2023-12-31 (×4): 40 mg
  Filled 2023-12-27 (×4): qty 2

## 2023-12-27 MED ORDER — PREDNISONE 5 MG PO TABS
10.0000 mg | ORAL_TABLET | Freq: Every day | ORAL | Status: DC
Start: 2024-01-15 — End: 2024-01-02

## 2023-12-27 MED ORDER — PREDNISONE 20 MG PO TABS
30.0000 mg | ORAL_TABLET | Freq: Every day | ORAL | Status: DC
  Administered 2024-01-01 – 2024-01-02 (×2): 30 mg
  Filled 2023-12-27 (×2): qty 2

## 2023-12-27 MED ORDER — LOSARTAN POTASSIUM 25 MG PO TABS
25.0000 mg | ORAL_TABLET | Freq: Every day | ORAL | Status: DC
  Administered 2023-12-27 – 2023-12-31 (×5): 25 mg
  Filled 2023-12-27 (×5): qty 1

## 2023-12-27 MED ORDER — POLYETHYLENE GLYCOL 3350 17 G PO PACK: 17.0000 g | PACK | Freq: Every day | ORAL | Status: DC | PRN

## 2023-12-27 NOTE — Plan of Care (Signed)
  Problem: Clinical Measurements: Goal: Respiratory complications will improve Outcome: Progressing Goal: Cardiovascular complication will be avoided Outcome: Progressing   Problem: Activity: Goal: Risk for activity intolerance will decrease Outcome: Progressing   Problem: Elimination: Goal: Will not experience complications related to urinary retention Outcome: Progressing   Problem: Skin Integrity: Goal: Risk for impaired skin integrity will decrease Outcome: Progressing   Problem: Fluid Volume: Goal: Ability to maintain a balanced intake and output will improve Outcome: Progressing   Problem: Nutritional: Goal: Maintenance of adequate nutrition will improve Outcome: Progressing   Problem: Skin Integrity: Goal: Risk for impaired skin integrity will decrease Outcome: Progressing   Problem: Safety: Goal: Non-violent Restraint(s) Outcome: Progressing   Problem: Ischemic Stroke/TIA Tissue Perfusion: Goal: Complications of ischemic stroke/TIA will be minimized Outcome: Progressing   Problem: Coping: Goal: Will identify appropriate support needs Outcome: Progressing

## 2023-12-27 NOTE — TOC Progression Note (Signed)
 Transition of Care [facility]) - Progression Note    Patient Details  Name: Meredith Park MRN: [id number] Date of Birth: [birth year]  [facility]) CM/SW Contact  Mickael Mcnutt Isaias March, Student-Social Work Phone Number: 12/27/2023, 11:12 AM  Clinical Narrative:   MSW Student spoke to patients mother [name] over the phone and the family has chosen SNF [facility]. MSW Student contacted admission coordinator at [facility] to start insurance authorization for patient. MSW Student to follow.      Expected Discharge Plan: Skilled Nursing Facility Barriers to Discharge: Continued Medical Work up, English as a second language teacher, SNF Pending bed offer  Expected Discharge Plan and Services In-house Referral: Clinical Social Work   Post Acute Care Choice: Skilled Nursing Facility Living arrangements for the past 2 months: Single Family Home                                       Social Determinants of Health (SDOH) Interventions SDOH Screenings   Food Insecurity: Low Risk  (07/18/2023)   Received from [facility]  Housing: Low Risk  (07/18/2023)   Received from [facility]  Transportation Needs: No Transportation Needs (07/18/2023)   Received from [facility]  Utilities: Low Risk  (07/18/2023)   Received from [facility]  Tobacco Use: Low Risk  (12/25/2023)  Recent Concern: Tobacco Use - Medium Risk (11/05/2023)   Received from [facility]    Readmission Risk Interventions    12/12/2023    3:50 PM  Readmission Risk Prevention Plan  Transportation Screening Complete  Medication Review (RN Care Manager) Complete  PCP or Specialist appointment within 3-5 days of discharge Complete  HRI or Home Care Consult Complete  SW Recovery Care/Counseling Consult Complete  Palliative Care Screening Not Applicable  Skilled Nursing Facility Complete

## 2023-12-27 NOTE — TOC Progression Note (Signed)
 Transition of Care [facility]) - Progression Note    Patient Details  Name: Meredith Park MRN: [id number] Date of Birth: 1971-05-03  Transition of [facility]) CM/SW Contact  Jonathan Neighbor, RN Phone Number: 12/27/2023, 4:01 PM  Clinical Narrative:     Mom called CM and asked to have [facility] re-look at referral. She states she has friends at the facility. CM has updated Whitney at [facility] and re-sent referral. TOC following.  Expected Discharge Plan: Skilled Nursing Facility Barriers to Discharge: Continued Medical Work up, English as a second language teacher, SNF Pending bed offer  Expected Discharge Plan and Services In-house Referral: Clinical Social Work   Post Acute Care Choice: Skilled Nursing Facility Living arrangements for the past 2 months: Single Family Home                                       Social Determinants of Health (SDOH) Interventions SDOH Screenings   Food Insecurity: Low Risk  (07/18/2023)   Received from [facility]  Housing: Low Risk  (07/18/2023)   Received from [facility]  Transportation Needs: No Transportation Needs (07/18/2023)   Received from [facility]  Utilities: Low Risk  (07/18/2023)   Received from [facility]  Tobacco Use: Low Risk  (12/25/2023)  Recent Concern: Tobacco Use - Medium Risk (11/05/2023)   Received from [facility]    Readmission Risk Interventions    12/12/2023    3:50 PM  Readmission Risk Prevention Plan  Transportation Screening Complete  Medication Review (RN Care Manager) Complete  PCP or Specialist appointment within 3-5 days of discharge Complete  HRI or Home Care Consult Complete  SW Recovery Care/Counseling Consult Complete  Palliative Care Screening Not Applicable  Skilled Nursing Facility Complete

## 2023-12-27 NOTE — Progress Notes (Signed)
 Physical Therapy Treatment Patient Details Name: Meredith Park MRN: [id number] DOB: April 08, 1971 Today's Date: 12/27/2023   History of Present Illness 53 yo female admitted 11/27/23 s/p seizure with acute encephalopathy, acute respiratory failure, septic shock secondary to aspiration PNA. LP 3/26, ETT 3/26-4/2, re-intubated 4/2. Extubated 4/7. PMH: HTN, HLD ETOH abuse hx, seizure    PT Comments  Slow progress towards goals. Pt sitting balance is slowly improving. Today pt was Mod A sitting EOB with brief moments of Min A to CGA. Focus on finding mid line and looking toward the L as well as worked on reaching in sitting to improve balance especially to the R to prevent L lean secondary to pushing. Pt was able to stand at 2 person min A. Currently pt has poor kinesthetic awareness, motor control and coordination which made it difficulty for pt to participate in wgt shifting activity; 3x good wgt shift using names of this physical therapist and assist to encourage wgt shifting. Due to pt current functional status, home set up and available assistance at home recommending skilled physical therapy services < 3 hours/day in order to address strength, balance and functional mobility to decrease risk for falls, injury, immobility, skin break down and re-hospitalization.      If plan is discharge home, recommend the following: Two people to help with walking and/or transfers;Assistance with cooking/housework;Assist for transportation;Help with stairs or ramp for entrance;Supervision due to cognitive status   Can travel by private vehicle     No  Equipment Recommendations  Wheelchair cushion (measurements PT);Wheelchair (measurements PT);Hospital bed;Hoyer lift       Precautions / Restrictions Precautions Precautions: Fall;Other (comment) Recall of Precautions/Restrictions: Impaired Precaution/Restrictions Comments: rectal pouch,PEG, R wrist restraint Restrictions Weight Bearing Restrictions Per  Provider Order: No     Mobility  Bed Mobility Overal bed mobility: Needs Assistance Bed Mobility: Rolling, Sidelying to Sit, Sit to Sidelying     Supine to sit: Mod assist Sit to supine: Mod assist, +2 for physical assistance   General bed mobility comments: verbal cues for sequencing in order to decrease need for physical assist including step by step instructions for sequencing and how to manage her LLE with RLE with assistance. Pt has difficulty following direction to push trunk up and over compensates pushing hard to the L. pt had difficulty with coordination to go back down toward R to get head on pillow due to poor motor control/coordination pt requires tactile cues at the elbow and verbal cues to go to forearm to facilitate correct movement.    Transfers Overall transfer level: Needs assistance Equipment used: 2 person hand held assist Transfers: Sit to/from Stand Sit to Stand: Min assist   Step pivot transfers: +2 safety/equipment, +2 physical assistance       General transfer comment: Min A +2 for sit to stand 2x from EOB. Attempted wgt shifting with pt and pt due to poor kinesthetic awareness was unable to wgt shift through bil LE.    Ambulation/Gait     Pre-gait activities: worked on wgt shifting with pt experiencing difficulty coordinating movement pattern and poor kinesthetic awareness to perform or be assited in performing wgt shifting.      Modified Rankin (Stroke Patients Only) Modified Rankin (Stroke Patients Only) Pre-Morbid Rankin Score: No symptoms Modified Rankin: Severe disability     Balance Overall balance assessment: Needs assistance Sitting-balance support: Feet supported Sitting balance-Leahy Scale: Poor Sitting balance - Comments: mod L and posterior lean requires with moments of Min to CGA Postural  control: Posterior lean, Left lateral lean Standing balance support: Bilateral upper extremity supported, During functional activity Standing  balance-Leahy Scale: Poor Standing balance comment: Min A +2 to maintain        Communication Communication Communication: Impaired Factors Affecting Communication: Difficulty expressing self;Reduced clarity of speech  Cognition Arousal: Alert Behavior During Therapy: Flat affect   PT - Cognitive impairments: Difficult to assess Difficult to assess due to: Impaired communication       PT - Cognition Comments: Hypophonic voice, pt seems slightly paranoid stating someone is going to come yell at her then stating there are alot of peole in the room with us  watching her. Following commands: Impaired Following commands impaired: Follows one step commands inconsistently    Cueing Cueing Techniques: Verbal cues, Tactile cues, Visual cues     General Comments General comments (skin integrity, edema, etc.): no signs/symptoms of cardiac/respiratory distress during session      Pertinent Vitals/Pain Pain Assessment Pain Assessment: Faces Faces Pain Scale: Hurts a little bit Breathing: normal Negative Vocalization: occasional moan/groan, low speech, negative/disapproving quality Facial Expression: smiling or inexpressive Body Language: tense, distressed pacing, fidgeting Consolability: no need to console PAINAD Score: 2 Pain Location: abdominal binder and PEG tube Pain Descriptors / Indicators: Discomfort Pain Intervention(s): Monitored during session     PT Goals (current goals can now be found in the care plan section) Acute Rehab PT Goals Patient Stated Goal: unable to state PT Goal Formulation: With patient/family Time For Goal Achievement: 01/04/24 Potential to Achieve Goals: Fair Progress towards PT goals: Progressing toward goals    Frequency    Min 2X/week      PT Plan  Continue with current POC        AM-PAC PT "6 Clicks" Mobility   Outcome Measure  Help needed turning from your back to your side while in a flat bed without using bedrails?: A Lot Help  needed moving from lying on your back to sitting on the side of a flat bed without using bedrails?: A Lot Help needed moving to and from a bed to a chair (including a wheelchair)?: A Lot Help needed standing up from a chair using your arms (e.g., wheelchair or bedside chair)?: A Lot Help needed to walk in hospital room?: Total Help needed climbing 3-5 steps with a railing? : Total 6 Click Score: 10    End of Session Equipment Utilized During Treatment: Gait belt Activity Tolerance: Patient tolerated treatment well;Patient limited by fatigue Patient left: in bed;with call bell/phone within reach;with bed alarm set Nurse Communication: Mobility status PT Visit Diagnosis: Muscle weakness (generalized) (M62.81);Difficulty in walking, not elsewhere classified (R26.2);Other symptoms and signs involving the nervous system (R29.898)     Time: 1610-9604 PT Time Calculation (min) (ACUTE ONLY): 28 min  Charges:    $Therapeutic Activity: 8-22 mins $Neuromuscular Re-education: 8-22 mins PT General Charges $$ ACUTE PT VISIT: 1 Visit                     Sloan Duncans, DPT, CLT  Acute Rehabilitation Services Office: [phone] (Secure chat preferred)    [name] 12/27/2023, 4:18 PM

## 2023-12-27 NOTE — Progress Notes (Signed)
 [facility]                                                                              Perla Echavarria, is a 53 y.o. female, DOB - 03-15-1971, NGE:[id number] Admit date - 11/27/2023    Outpatient Primary MD for the patient is Fulbright, [place]  E, PA-C  LOS - 30  days  Chief Complaint  Patient presents with   Seizures       Brief summary   53 year old female with history of alcohol abuse, HLD, seizures, hypertension, abstinence from alcohol for last 1 year, found down beside her bed by her family members, was noted to be shaking and unresponsive.  EMS was called and was found to be in generalized tonic-clonic seizures, treated with Versed  with cessation of seizures, remained minimally responsive in the ED.  Hypoxemic requiring 10 L O2 and was intubated to protect airway and admitted to the ICU. Significant events as below. 3/25 admit to ICU.  Hypoxemic and unresponsive.  Intubated 3/26 LP performed. Increasing pressor requirement 3/27 Increased vasopressor requirement with worsening MOD including AKI, shock liver. Echo with stress CM. 3/29 Improved vasopressor requirements 4/2 central line d/c, extubated (had cuff leak), reintubated for stridor & secretions several hours later 4/5 CTH w/progression of unihemispheric edema, thought more c/w seizure changes 4/6 LTM, steroids, 4/7 extubated, intermittently on predex for agitation, LTM d/c'd, tmax 101.6/ WBC stable 4/10 on room air.  Transferred to medical floor.  Core track feeding. 4/11 full of secretions unable to clear , requiring non- rebreather, transferred back to ICU 4/14: Transferred out of ICU, [facility] 4/17 patient working with SLP making progress. PEG placement requested.  4/18 MRI brain reviewed and neurology signed off. 4/21 IR unable to place the PEG due to colon / safe window. Gen surgery consulted. 4/22 surgical PEG placement today. Overnight patient had some blood mixed with stool in flexi seal positive for  occult blood. GI consulted for recommendations.    Assessment & Plan      Status epilepticus (HCC) in the setting of history of seizure disorder - Acute encephalopathy secondary to status epilepticus - Right cerebral hemisphere edema consistent with postictal state, still has left-sided weakness -Neurology following suspect this is postsurgical versus potential autoimmune/paraneoplastic/vasculitis. -CT of the chest abdomen and pelvis without any signs of malignancy -Neurology recommends to continue with Topamax  100 mg daily, lamotrigine  250 mg BID and DC Vimpat  and  to continue with Prednisone  taper by 10 mg every week. -Currently she is on prednisone  40 mg starting from 4/22. - Repeat MRI brain showed recent punctate infarcts in the left temporal subcortical white matter and superior right cerebellum. Evolutionary changes throughout the right cerebral and left cerebellar hemispheres as described above most compatible with evolving injury from recent status epilepticus. -  on aspirin  81 mg daily and lipitor 40 mg daily for secondary stroke prevention.       Acute hypoxic respiratory failure secondary to aspiration versus volume overload -Patient was initially intubated for status epilepticus and airway protection, subsequently reintubated due to aspiration versus volume overload, has been extubated - O2 sats 100% on room air  - Continue  PT OT, I-S, SLP - completed course of antibiotics.  - echocardiogram showed LVEF of 40 to 45%, left ventricle demonstrates regional wall abnormalities.      Dysphagia/ Nutrition -Patient was placed on core track, failed SLP evaluation -Palliative medicine consulted for goals of care/PEG tubes:  Continue with full scope treatments with aggressive life prolonging interventions including PEG.  -IR unable to place PEG due to no safe window.  - Surgery consulted and underwent EGD and PEG tube placement on 4/22  - Tube feeds started -Per GI today, no  intervention is needed as endoscopy was completed by surgery on 4/22 and was unremarkable.       New onset cardiomyopathy, elevated troponin likely due to demand ischemia Hypertension - 2D echo 11/28/2023 showed EF of 40 to 45%, G1 DD, regional WMA -Seen by cardiology, recommending conservative management for now.  - Continue Coreg , cardiology recommended repeat echo in near future and if she has meaningful neurological recovery, please reach out to cardiology for further work up of new onset cardiomyopathy.  - currently on coreg  and losartan .        Acute Kidney injury secondary to rhabdomyolysis, ATN, metabolic acidosis Creatinine is back to baseline,  Bicarb has improved to 20.  Recheck bmet in a.m.   Hypernatremia.  Resolved.      Anemia of acute illness/ guaiac positive stools:  Per GI, no intervention needed as endoscopy was completed by surgery on 4/22 and was unremarkable Continue PPI  -Recheck CBC in a.m.   Acute transaminitis Improving     Hyperglycemia probably secondary to steroids and tube feeds CBG (last 3)  Recent Labs    12/27/23 0359 12/27/23 0750 12/27/23 1225  GLUCAP 138* 134* 183*   Continue sliding scale insulin      Leukocytosis from steroids.  Improving.    Hypotension on 4/21  From iv fentanyl  and versad Ordered fluid bolus with RL,  BP parameters improved.   Severe protein calorie malnutrition Nutrition Problem: Severe Malnutrition Etiology: social / environmental circumstances Signs/Symptoms: severe muscle depletion, percent weight loss  Interventions: Prostat, Tube feeding, MVI  Estimated body mass index is 25.28 kg/m as calculated from the following:   Height as of this encounter: 5\' 2"  (1.575 m).   Weight as of this encounter: 62.7 kg.  Code Status: Full code DVT Prophylaxis:  heparin  injection 5,000 Units Start: 12/04/23 0930 SCDs Start: 11/27/23 2310   Level of Care: Level of care: Progressive Family Communication:  Updated patient Disposition Plan:      Remains inpatient appropriate:      Procedures:  PEG tube placement on 4/22 by surgery  Consultants:   General Surgery Neurology IR  Antimicrobials:   Anti-infectives (From admission, onward)    Start     Dose/Rate Route Frequency Ordered Stop   12/24/23 0910  ceFAZolin  (ANCEF ) IVPB 2g/100 mL premix        over 30 Minutes Intravenous Continuous PRN 12/24/23 0911 12/24/23 0910   12/14/23 1245  Ampicillin -Sulbactam (UNASYN ) 3 g in sodium chloride  0.9 % 100 mL IVPB        3 g 200 mL/hr over 30 Minutes Intravenous Every 6 hours 12/14/23 1154 12/19/23 0640   12/04/23 0930  piperacillin -tazobactam (ZOSYN ) IVPB 3.375 g  Status:  Discontinued        3.375 g 12.5 mL/hr over 240 Minutes Intravenous Every 8 hours 12/04/23 0839 12/10/23 1048   11/30/23 1000  cefTRIAXone  (ROCEPHIN ) 2 g in sodium chloride  0.9 % 100 mL IVPB  Status:  Discontinued        2 g 200 mL/hr over 30 Minutes Intravenous Every 24 hours 11/29/23 1006 12/04/23 0830   11/29/23 1000  vancomycin  (VANCOREADY) IVPB 750 mg/150 mL  Status:  Discontinued        750 mg 150 mL/hr over 60 Minutes Intravenous Every 24 hours 11/28/23 0245 11/29/23 1006   11/28/23 2200  acyclovir  (ZOVIRAX ) 630 mg in dextrose  5 % 100 mL IVPB  Status:  Discontinued        10 mg/kg  63 kg 112.6 mL/hr over 60 Minutes Intravenous Every 24 hours 11/28/23 0247 11/29/23 0836   11/28/23 1000  ampicillin  (OMNIPEN) 2 g in sodium chloride  0.9 % 100 mL IVPB  Status:  Discontinued        2 g 300 mL/hr over 20 Minutes Intravenous Every 8 hours 11/28/23 0249 11/29/23 1006   11/28/23 0115  vancomycin  (VANCOREADY) IVPB 2000 mg/400 mL        2,000 mg 200 mL/hr over 120 Minutes Intravenous  Once 11/28/23 0028 11/28/23 0308   11/28/23 0115  cefTRIAXone  (ROCEPHIN ) 2 g in sodium chloride  0.9 % 100 mL IVPB  Status:  Discontinued        2 g 200 mL/hr over 30 Minutes Intravenous Every 12 hours 11/28/23 0028 11/29/23 1006   11/28/23  0115  ampicillin  (OMNIPEN) 2 g in sodium chloride  0.9 % 100 mL IVPB        2 g 300 mL/hr over 20 Minutes Intravenous  Once 11/28/23 0028 11/28/23 0129   11/28/23 0100  acyclovir  (ZOVIRAX ) 700 mg in dextrose  5 % 100 mL IVPB        700 mg 114 mL/hr over 60 Minutes Intravenous  Once 11/28/23 0029 11/28/23 0301          Medications  aspirin   81 mg Per Tube Daily   atorvastatin   40 mg Per Tube Daily   carvedilol   3.125 mg Per Tube BID WC   Chlorhexidine  Gluconate Cloth  6 each Topical Daily   feeding supplement (PROSource TF20)  60 mL Per Tube Daily   fiber supplement (BANATROL TF)  60 mL Per Tube BID   folic acid   1 mg Per Tube Daily   free water   200 mL Per Tube Q4H   heparin  injection (subcutaneous)  5,000 Units Subcutaneous Q8H   insulin  aspart  0-9 Units Subcutaneous Q4H   lamoTRIgine   250 mg Per Tube BID   losartan   25 mg Per Tube Daily   multivitamin with minerals  1 tablet Per Tube Daily   nystatin  cream   Topical BID   mouth rinse  15 mL Mouth Rinse 4 times per day   pantoprazole  (PROTONIX ) IV  40 mg Intravenous Q24H   [START ON 12/28/2023] predniSONE   40 mg Per Tube Q breakfast   Followed by   Cecily Cohen ON 01/01/2024] predniSONE   30 mg Per Tube Q breakfast   Followed by   Cecily Cohen ON 01/08/2024] predniSONE   20 mg Per Tube Q breakfast   Followed by   Cecily Cohen ON 01/15/2024] predniSONE   10 mg Per Tube Q breakfast   QUEtiapine   50 mg Per Tube QHS   sertraline   25 mg Per Tube QHS   thiamine   100 mg Per Tube Daily   topiramate   100 mg Per Tube QHS      Subjective:   Meredith Park was seen and examined today.  No acute issues.  Tolerating tube feeds.  Persistent left-sided weakness.  Objective:  Vitals:   12/27/23 0356 12/27/23 0752 12/27/23 0950 12/27/23 1229  BP:  118/80 120/76 99/67  Pulse:  85  85  Resp:  19  18  Temp:  97.8 F (36.6 C)  98.3 F (36.8 C)  TempSrc:  Oral  Oral  SpO2:  100%  97%  Weight: 62.7 kg     Height:        Intake/Output Summary (Last 24  hours) at 12/27/2023 1422 Last data filed at 12/27/2023 0800 Gross per 24 hour  Intake 50 ml  Output 1700 ml  Net -1650 ml     Wt Readings from Last 3 Encounters:  12/27/23 62.7 kg  11/23/22 105.9 kg  11/22/22 105.9 kg   Physical Exam General: Alert and oriented, NAD, speaks softly Cardiovascular: S1 S2 clear, RRR.  Respiratory: CTAB, no wheezing, rales or rhonchi Gastrointestinal: Soft, nontender, nondistended, NBS Ext: no pedal edema bilaterally Neuro: left-sided hemiplegia, neglect Psych: following commands   Data Reviewed:  I have personally reviewed following labs    CBC Lab Results  Component Value Date   WBC 10.0 12/26/2023   RBC 2.98 (L) 12/26/2023   HGB 8.3 (L) 12/26/2023   HCT 26.6 (L) 12/26/2023   MCV 89.3 12/26/2023   MCH 27.9 12/26/2023   PLT 135 (L) 12/26/2023   MCHC 31.2 12/26/2023   RDW 18.1 (H) 12/26/2023   LYMPHSABS 1.5 12/26/2023   MONOABS 0.4 12/26/2023   EOSABS 0.1 12/26/2023   BASOSABS 0.0 12/26/2023     Last metabolic panel Lab Results  Component Value Date   NA 136 12/26/2023   K 3.6 12/26/2023   CL 109 12/26/2023   CO2 18 (L) 12/26/2023   BUN 19 12/26/2023   CREATININE 0.73 12/26/2023   GLUCOSE 133 (H) 12/26/2023   GFRNONAA >60 12/26/2023   GFRAA >60 11/10/2018   CALCIUM  8.7 (L) 12/26/2023   PHOS 4.0 12/12/2023   PROT 4.8 (L) 12/26/2023   ALBUMIN  2.9 (L) 12/26/2023   BILITOT 0.5 12/26/2023   ALKPHOS 55 12/26/2023   AST 70 (H) 12/26/2023   ALT 30 12/26/2023   ANIONGAP 9 12/26/2023    CBG (last 3)  Recent Labs    12/27/23 0359 12/27/23 0750 12/27/23 1225  GLUCAP 138* 134* 183*      Coagulation Profile: No results for input(s): "INR", "PROTIME" in the last 168 hours.   Radiology Studies: I have personally reviewed the imaging studies  No results found.      Bertram Brocks M.D. [facility] 12/27/2023, 2:22 PM  Available via Epic secure chat 7am-7pm After 7 pm, please refer to night coverage provider  listed on amion.

## 2023-12-28 DIAGNOSIS — G40901 Epilepsy, unspecified, not intractable, with status epilepticus: Secondary | ICD-10-CM | POA: Diagnosis not present

## 2023-12-28 DIAGNOSIS — N179 Acute kidney failure, unspecified: Secondary | ICD-10-CM | POA: Diagnosis not present

## 2023-12-28 DIAGNOSIS — R4182 Altered mental status, unspecified: Secondary | ICD-10-CM | POA: Diagnosis not present

## 2023-12-28 LAB — GLUCOSE, CAPILLARY
Glucose-Capillary: 133 mg/dL — ABNORMAL HIGH (ref 70–99)
Glucose-Capillary: 134 mg/dL — ABNORMAL HIGH (ref 70–99)
Glucose-Capillary: 143 mg/dL — ABNORMAL HIGH (ref 70–99)
Glucose-Capillary: 143 mg/dL — ABNORMAL HIGH (ref 70–99)
Glucose-Capillary: 159 mg/dL — ABNORMAL HIGH (ref 70–99)
Glucose-Capillary: 183 mg/dL — ABNORMAL HIGH (ref 70–99)
Glucose-Capillary: 97 mg/dL (ref 70–99)

## 2023-12-28 LAB — RENAL FUNCTION PANEL
Albumin: 2.8 g/dL — ABNORMAL LOW (ref 3.5–5.0)
Anion gap: 12 (ref 5–15)
BUN: 17 mg/dL (ref 6–20)
CO2: 16 mmol/L — ABNORMAL LOW (ref 22–32)
Calcium: 8.7 mg/dL — ABNORMAL LOW (ref 8.9–10.3)
Chloride: 110 mmol/L (ref 98–111)
Creatinine, Ser: 0.59 mg/dL (ref 0.44–1.00)
GFR, Estimated: >60 mL/min (ref >60)
Glucose, Bld: 104 mg/dL — ABNORMAL HIGH (ref 70–99)
Phosphorus: 2.5 mg/dL (ref 2.5–4.6)
Potassium: 3.8 mmol/L (ref 3.5–5.1)
Sodium: 138 mmol/L (ref 135–145)

## 2023-12-28 LAB — CBC
HCT: 27.3 % — ABNORMAL LOW (ref 36.0–46.0)
Hemoglobin: 8.6 g/dL — ABNORMAL LOW (ref 12.0–15.0)
MCH: 28.8 pg (ref 26.0–34.0)
MCHC: 31.5 g/dL (ref 30.0–36.0)
MCV: 91.3 fL (ref 80.0–100.0)
Platelets: 115 10*3/uL — ABNORMAL LOW (ref 150–400)
RBC: 2.99 MIL/uL — ABNORMAL LOW (ref 3.87–5.11)
RDW: 18.5 % — ABNORMAL HIGH (ref 11.5–15.5)
WBC: 14.8 10*3/uL — ABNORMAL HIGH (ref 4.0–10.5)
nRBC: 0 % (ref 0.0–0.2)

## 2023-12-28 MED ORDER — QUETIAPINE FUMARATE 25 MG PO TABS
12.5000 mg | ORAL_TABLET | Freq: Every morning | ORAL | Status: DC
  Administered 2023-12-29 – 2023-12-31 (×3): 12.5 mg
  Filled 2023-12-28 (×4): qty 1

## 2023-12-28 MED ORDER — SODIUM BICARBONATE 650 MG PO TABS
650.0000 mg | ORAL_TABLET | Freq: Three times a day (TID) | ORAL | Status: DC
  Administered 2023-12-28 – 2024-01-02 (×15): 650 mg
  Filled 2023-12-28 (×15): qty 1

## 2023-12-28 NOTE — Progress Notes (Signed)
 Physical Therapy Treatment Patient Details Name: Meredith Park MRN: [id number] DOB: 1971-04-01 Today's Date: 12/28/2023   History of Present Illness 53 yo female admitted 11/27/23 s/p seizure with acute encephalopathy, acute respiratory failure, septic shock secondary to aspiration PNA. LP 3/26, ETT 3/26-4/2, re-intubated 4/2. Extubated 4/7. PMH: HTN, HLD ETOH abuse hx, seizure    PT Comments  Pt is slowly working on balance in sitting/standing and increased awareness of the L side. Pt was able to grab her L hand today when in her lap correctly on the first try. When moved to the L of her lap pt had difficulty finding her hand. Worked on wgt shifting and finding body parts on the L today in sitting with pt between Max to Min A for seated balance. Pt is 2 person Min A for standing but unable to independently progress her LLE with Max A at pelvis for wgt shifting and unable to clear her RLE despite L knee block and wgt shifting toward the L at Max A. Due to pt current functional status, home set up and available assistance at home recommending skilled physical therapy services < 3 hours/day in order to address strength, balance and functional mobility to decrease risk for falls, injury, immobility, skin break down and re-hospitalization.      If plan is discharge home, recommend the following: Two people to help with walking and/or transfers;Assistance with cooking/housework;Assist for transportation;Help with stairs or ramp for entrance;Supervision due to cognitive status   Can travel by private vehicle     No  Equipment Recommendations  Wheelchair cushion (measurements PT);Wheelchair (measurements PT);Hospital bed;Hoyer lift       Precautions / Restrictions Precautions Precautions: Fall;Other (comment) Recall of Precautions/Restrictions: Impaired Precaution/Restrictions Comments: rectal pouch,PEG, R wrist restraint Restrictions Weight Bearing Restrictions Per Provider Order: No      Mobility  Bed Mobility Overal bed mobility: Needs Assistance Bed Mobility: Rolling, Sidelying to Sit, Sit to Sidelying Rolling: Max assist, Used rails, +2 for physical assistance Sidelying to sit: Max assist, +2 for physical assistance   Sit to supine: Mod assist, +2 for physical assistance   General bed mobility comments: cues to sequence, pt able to partially assist with RLE and RUE supported on bed rail.    Transfers Overall transfer level: Needs assistance Equipment used: 2 person hand held assist Transfers: Sit to/from Stand Sit to Stand: Min assist, +2 safety/equipment           General transfer comment: able to rise into standing with min A +2, Max A for weight shifts L/R and to have therapist facilitate movement of LLE. Pt performing minor movement of LLE but not enough to offload without assist.    Ambulation/Gait   General Gait Details: unable    Modified Rankin (Stroke Patients Only) Modified Rankin (Stroke Patients Only) Pre-Morbid Rankin Score: No symptoms Modified Rankin: Severe disability     Balance Overall balance assessment: Needs assistance Sitting-balance support: Feet supported Sitting balance-Leahy Scale: Poor Sitting balance - Comments: mod to max A to maintain sititng balance Postural control: Left lateral lean, Posterior lean Standing balance support: Bilateral upper extremity supported, During functional activity Standing balance-Leahy Scale: Poor Standing balance comment: Min A +2 to maintain      Communication Communication Communication: Impaired Factors Affecting Communication: Difficulty expressing self;Reduced clarity of speech  Cognition Arousal: Alert Behavior During Therapy: Flat affect   PT - Cognitive impairments: Difficult to assess Difficult to assess due to: Impaired communication       PT -  Cognition Comments: Hypophonic voice Following commands: Impaired Following commands impaired: Follows one step commands  inconsistently, Follows one step commands with increased time    Cueing Cueing Techniques: Verbal cues, Tactile cues, Visual cues     General Comments General comments (skin integrity, edema, etc.): no signs/symptoms of cardiac/respiratory distress during session      Pertinent Vitals/Pain Pain Assessment Pain Assessment: Faces Faces Pain Scale: Hurts a little bit Pain Location: abdominal binder and PEG tube Pain Descriptors / Indicators: Discomfort Pain Intervention(s): Monitored during session     PT Goals (current goals can now be found in the care plan section) Acute Rehab PT Goals Patient Stated Goal: unable to state PT Goal Formulation: With patient/family Time For Goal Achievement: 01/04/24 Potential to Achieve Goals: Fair Progress towards PT goals: Progressing toward goals    Frequency    Min 2X/week      PT Plan  Continue with current POC     Co-evaluation   Reason for Co-Treatment: Complexity of the patient's impairments (multi-system involvement);For patient/therapist safety;To address functional/ADL transfers PT goals addressed during session: Mobility/safety with mobility;Balance OT goals addressed during session: Strengthening/ROM      AM-PAC PT "6 Clicks" Mobility   Outcome Measure  Help needed turning from your back to your side while in a flat bed without using bedrails?: A Lot Help needed moving from lying on your back to sitting on the side of a flat bed without using bedrails?: A Lot Help needed moving to and from a bed to a chair (including a wheelchair)?: A Lot Help needed standing up from a chair using your arms (e.g., wheelchair or bedside chair)?: A Lot Help needed to walk in hospital room?: Total Help needed climbing 3-5 steps with a railing? : Total 6 Click Score: 10    End of Session Equipment Utilized During Treatment: Gait belt Activity Tolerance: Patient tolerated treatment well Patient left: in bed;with call bell/phone within  reach;with bed alarm set Nurse Communication: Mobility status PT Visit Diagnosis: Muscle weakness (generalized) (M62.81);Difficulty in walking, not elsewhere classified (R26.2);Other symptoms and signs involving the nervous system (R29.898)     Time: 9629-5284 PT Time Calculation (min) (ACUTE ONLY): 38 min  Charges:    $Therapeutic Activity: 8-22 mins PT General Charges $$ ACUTE PT VISIT: 1 Visit                    Sloan Duncans, DPT, CLT  Acute Rehabilitation Services Office: [phone] (Secure chat preferred)    [name] 12/28/2023, 1:28 PM

## 2023-12-28 NOTE — TOC Progression Note (Addendum)
 Transition of Care [facility]) - Progression Note    Patient Details  Name: Meredith Park MRN: [id number] Date of Birth: July 07, 1971  [facility] CM/SW Contact  Paullette Boston [place], [place] Phone Number: 12/28/2023, 12:27 PM  Clinical Narrative: Per Laurina Popper at [facility], they are not able to offer a bed.  Pt's mother/[name] aware of denial and would like to continue with the plan of [facility] at [employer]. Nikki at [facility] reports they received insurance auth yesterday evening and it is valid for 7 days. Pt currently requiring right wrist restraint. Pt will need to be free from restraints for 24 hours prior to dc. [facility] not able to accept pt over weekend. SW will follow.   Paullette Boston, MSW, LCSW [phone] (coverage)    .    Expected Discharge Plan: Skilled Nursing Facility Barriers to Discharge: Continued Medical Work up, English as a second language teacher, SNF Pending bed offer  Expected Discharge Plan and Services In-house Referral: Clinical Social Work   Post Acute Care Choice: Skilled Nursing Facility Living arrangements for the past 2 months: Single Family Home                                       Social Determinants of Health (SDOH) Interventions SDOH Screenings   Food Insecurity: Low Risk  (07/18/2023)   Received from [facility]  Housing: Low Risk  (07/18/2023)   Received from [facility]  Transportation Needs: No Transportation Needs (07/18/2023)   Received from [facility]  Utilities: Low Risk  (07/18/2023)   Received from [facility]  Tobacco Use: Low Risk  (12/25/2023)  Recent Concern: Tobacco Use - Medium Risk (11/05/2023)   Received from [facility]    Readmission Risk Interventions    12/12/2023    3:50 PM  Readmission Risk Prevention Plan  Transportation Screening Complete  Medication Review (RN Care Manager) Complete  PCP or Specialist appointment within 3-5 days of discharge Complete  HRI or Home Care Consult Complete   SW Recovery Care/Counseling Consult Complete  Palliative Care Screening Not Applicable  Skilled Nursing Facility Complete

## 2023-12-28 NOTE — Progress Notes (Signed)
 [facility]                                                                              [name], is a 53 y.o. female, DOB - 1971-02-26, ZOX:[id number] Admit date - 11/27/2023    Outpatient Primary MD for the patient is Fulbright, [place]  E, PA-C  LOS - 31  days  Chief Complaint  Patient presents with   Seizures       Brief summary   53 year old female with history of alcohol abuse, HLD, seizures, hypertension, abstinence from alcohol for last 1 year, found down beside her bed by her family members, was noted to be shaking and unresponsive.  EMS was called and was found to be in generalized tonic-clonic seizures, treated with Versed  with cessation of seizures, remained minimally responsive in the ED.  Hypoxemic requiring 10 L O2 and was intubated to protect airway and admitted to the ICU. Significant events as below. 3/25 admit to ICU.  Hypoxemic and unresponsive.  Intubated 3/26 LP performed. Increasing pressor requirement 3/27 Increased vasopressor requirement with worsening MOD including AKI, shock liver. Echo with stress CM. 3/29 Improved vasopressor requirements 4/2 central line d/c, extubated (had cuff leak), reintubated for stridor & secretions several hours later 4/5 CTH w/progression of unihemispheric edema, thought more c/w seizure changes 4/6 LTM, steroids, 4/7 extubated, intermittently on predex for agitation, LTM d/c'd, tmax 101.6/ WBC stable 4/10 on room air.  Transferred to medical floor.  Core track feeding. 4/11 full of secretions unable to clear , requiring non- rebreather, transferred back to ICU 4/14: Transferred out of ICU, [facility] 4/17 patient working with SLP making progress. PEG placement requested.  4/18 MRI brain reviewed and neurology signed off. 4/21 IR unable to place the PEG due to colon / safe window. Gen surgery consulted. 4/22 surgical PEG placement today. Overnight patient had some blood mixed with stool in flexi seal positive for  occult blood. GI consulted for recommendations.    Assessment & Plan      Status epilepticus (HCC) in the setting of history of seizure disorder - Acute encephalopathy secondary to status epilepticus - Right cerebral hemisphere edema consistent with postictal state, still has left-sided weakness -Neurology following suspect this is postsurgical versus potential autoimmune/paraneoplastic/vasculitis. -CT of the chest abdomen and pelvis without any signs of malignancy -Neurology recommends to continue with Topamax  100 mg daily, lamotrigine  250 mg BID and DC Vimpat  and  to continue with Prednisone  taper by 10 mg every week. -Currently she is on prednisone  40 mg starting from 4/22. - Repeat MRI brain showed recent punctate infarcts in the left temporal subcortical white matter and superior right cerebellum. Evolutionary changes throughout the right cerebral and left cerebellar hemispheres as described above most compatible with evolving injury from recent status epilepticus. -  on aspirin  81 mg daily and lipitor 40 mg daily for secondary stroke prevention.       Acute hypoxic respiratory failure secondary to aspiration versus volume overload -Patient was initially intubated for status epilepticus and airway protection, subsequently reintubated due to aspiration versus volume overload, has been extubated - On room air, completed course of antibiotics.  -  echocardiogram showed LVEF of 40 to 45%, left ventricle demonstrates regional wall abnormalities.      Dysphagia/ Nutrition -Patient was placed on core track, failed SLP evaluation -Palliative medicine consulted for goals of care/PEG tubes:  Continue with full scope treatments with aggressive life prolonging interventions including PEG.  -IR unable to place PEG due to no safe window.  - Surgery consulted and underwent EGD and PEG tube placement on 4/22  - Tube feeds started -Per GI today, no intervention is needed as endoscopy was completed  by surgery on 4/22 and was unremarkable.       New onset cardiomyopathy, elevated troponin likely due to demand ischemia Hypertension - 2D echo 11/28/2023 showed EF of 40 to 45%, G1 DD, regional WMA -Seen by cardiology, recommending conservative management for now.  - Continue Coreg , cardiology recommended repeat echo in near future and if she has meaningful neurological recovery, please reach out to cardiology for further work up of new onset cardiomyopathy.  - currently on coreg  and losartan .        Acute Kidney injury secondary to rhabdomyolysis, ATN, metabolic acidosis Creatinine is back to baseline,  - Bicarb trending low, placed on sodium bicarb 650 mg 3 times daily  Agitation, acute encephalopathy - Continue Seroquel  50 mg at bedtime, currently has restraints - May need to add low-dose of a.m. Seroquel  12.5 mg daily  Hypernatremia.  Resolved.      Anemia of acute illness/ guaiac positive stools:  Per GI, no intervention needed as endoscopy was completed by surgery on 4/22 and was unremarkable Continue PPI  -Recheck CBC in a.m.   Acute transaminitis Improving     Hyperglycemia probably secondary to steroids and tube feeds CBG (last 3)  Recent Labs    12/28/23 0453 12/28/23 0809 12/28/23 1154  GLUCAP 134* 97 143*   Continue sliding scale insulin      Leukocytosis  -Likely from steroids   Hypotension on 4/21  From iv fentanyl  and versad Ordered fluid bolus with RL,  BP parameters improved.   Severe protein calorie malnutrition Nutrition Problem: Severe Malnutrition Etiology: social / environmental circumstances Signs/Symptoms: severe muscle depletion, percent weight loss  Interventions: Prostat, Tube feeding, MVI  Estimated body mass index is 25.2 kg/m as calculated from the following:   Height as of this encounter: 5\' 2"  (1.575 m).   Weight as of this encounter: 62.5 kg.  Code Status: Full code DVT Prophylaxis:  heparin  injection 5,000 Units  Start: 12/04/23 0930 SCDs Start: 11/27/23 2310   Level of Care: Level of care: Progressive Family Communication: Updated patient Disposition Plan:      Remains inpatient appropriate:   DC to SNF once off the restraints   Procedures:  PEG tube placement on 4/22 by surgery  Consultants:   General Surgery Neurology IR  Antimicrobials:   Anti-infectives (From admission, onward)    Start     Dose/Rate Route Frequency Ordered Stop   12/24/23 0910  ceFAZolin  (ANCEF ) IVPB 2g/100 mL premix        over 30 Minutes Intravenous Continuous PRN 12/24/23 0911 12/24/23 0910   12/14/23 1245  Ampicillin -Sulbactam (UNASYN ) 3 g in sodium chloride  0.9 % 100 mL IVPB        3 g 200 mL/hr over 30 Minutes Intravenous Every 6 hours 12/14/23 1154 12/19/23 0640   12/04/23 0930  piperacillin -tazobactam (ZOSYN ) IVPB 3.375 g  Status:  Discontinued        3.375 g 12.5 mL/hr over 240 Minutes Intravenous Every 8 hours  12/04/23 0839 12/10/23 1048   11/30/23 1000  cefTRIAXone  (ROCEPHIN ) 2 g in sodium chloride  0.9 % 100 mL IVPB  Status:  Discontinued        2 g 200 mL/hr over 30 Minutes Intravenous Every 24 hours 11/29/23 1006 12/04/23 0830   11/29/23 1000  vancomycin  (VANCOREADY) IVPB 750 mg/150 mL  Status:  Discontinued        750 mg 150 mL/hr over 60 Minutes Intravenous Every 24 hours 11/28/23 0245 11/29/23 1006   11/28/23 2200  acyclovir  (ZOVIRAX ) 630 mg in dextrose  5 % 100 mL IVPB  Status:  Discontinued        10 mg/kg  63 kg 112.6 mL/hr over 60 Minutes Intravenous Every 24 hours 11/28/23 0247 11/29/23 0836   11/28/23 1000  ampicillin  (OMNIPEN) 2 g in sodium chloride  0.9 % 100 mL IVPB  Status:  Discontinued        2 g 300 mL/hr over 20 Minutes Intravenous Every 8 hours 11/28/23 0249 11/29/23 1006   11/28/23 0115  vancomycin  (VANCOREADY) IVPB 2000 mg/400 mL        2,000 mg 200 mL/hr over 120 Minutes Intravenous  Once 11/28/23 0028 11/28/23 0308   11/28/23 0115  cefTRIAXone  (ROCEPHIN ) 2 g in sodium  chloride 0.9 % 100 mL IVPB  Status:  Discontinued        2 g 200 mL/hr over 30 Minutes Intravenous Every 12 hours 11/28/23 0028 11/29/23 1006   11/28/23 0115  ampicillin  (OMNIPEN) 2 g in sodium chloride  0.9 % 100 mL IVPB        2 g 300 mL/hr over 20 Minutes Intravenous  Once 11/28/23 0028 11/28/23 0129   11/28/23 0100  acyclovir  (ZOVIRAX ) 700 mg in dextrose  5 % 100 mL IVPB        700 mg 114 mL/hr over 60 Minutes Intravenous  Once 11/28/23 0029 11/28/23 0301          Medications  aspirin   81 mg Per Tube Daily   atorvastatin   40 mg Per Tube Daily   carvedilol   3.125 mg Per Tube BID WC   Chlorhexidine  Gluconate Cloth  6 each Topical Daily   feeding supplement (PROSource TF20)  60 mL Per Tube Daily   fiber supplement (BANATROL TF)  60 mL Per Tube BID   folic acid   1 mg Per Tube Daily   free water   200 mL Per Tube Q4H   heparin  injection (subcutaneous)  5,000 Units Subcutaneous Q8H   insulin  aspart  0-9 Units Subcutaneous Q4H   lamoTRIgine   250 mg Per Tube BID   losartan   25 mg Per Tube Daily   multivitamin with minerals  1 tablet Per Tube Daily   nystatin  cream   Topical BID   mouth rinse  15 mL Mouth Rinse 4 times per day   pantoprazole  (PROTONIX ) IV  40 mg Intravenous Q24H   predniSONE   40 mg Per Tube Q breakfast   Followed by   Cecily Cohen ON 01/01/2024] predniSONE   30 mg Per Tube Q breakfast   Followed by   Cecily Cohen ON 01/08/2024] predniSONE   20 mg Per Tube Q breakfast   Followed by   Cecily Cohen ON 01/15/2024] predniSONE   10 mg Per Tube Q breakfast   QUEtiapine   50 mg Per Tube QHS   sertraline   25 mg Per Tube QHS   thiamine   100 mg Per Tube Daily   topiramate   100 mg Per Tube QHS      Subjective:   Meredith Park  was seen and examined today.  This morning, calm and cooperative.  Persistent left-sided weakness, tolerating tube feeds.  Still in wrist restraints.    Objective:   Vitals:   12/28/23 0026 12/28/23 0452 12/28/23 0455 12/28/23 0810  BP: 103/75 124/87  111/70  Pulse:  91 87  80  Resp: 18 18  18   Temp: 99.9 F (37.7 C) 98.4 F (36.9 C)  99.3 F (37.4 C)  TempSrc: Oral Oral  Oral  SpO2: 98% 99%  96%  Weight:   62.5 kg   Height:        Intake/Output Summary (Last 24 hours) at 12/28/2023 1237 Last data filed at 12/28/2023 0448 Gross per 24 hour  Intake 2640.44 ml  Output 1500 ml  Net 1140.44 ml     Wt Readings from Last 3 Encounters:  12/28/23 62.5 kg  11/23/22 105.9 kg  11/22/22 105.9 kg    Physical Exam General: Alert and awake, NAD, still in restraints Cardiovascular: S1 S2 clear, RRR.  Respiratory: CTAB Gastrointestinal: Soft, NT, ND, PEG tube Ext: no pedal edema bilaterally, heels in protectors Neuro: left-sided hemiplegia Skin: No rashes Psych: flat affect  Data Reviewed:  I have personally reviewed following labs    CBC Lab Results  Component Value Date   WBC 14.8 (H) 12/28/2023   RBC 2.99 (L) 12/28/2023   HGB 8.6 (L) 12/28/2023   HCT 27.3 (L) 12/28/2023   MCV 91.3 12/28/2023   MCH 28.8 12/28/2023   PLT 115 (L) 12/28/2023   MCHC 31.5 12/28/2023   RDW 18.5 (H) 12/28/2023   LYMPHSABS 1.5 12/26/2023   MONOABS 0.4 12/26/2023   EOSABS 0.1 12/26/2023   BASOSABS 0.0 12/26/2023     Last metabolic panel Lab Results  Component Value Date   NA 138 12/28/2023   K 3.8 12/28/2023   CL 110 12/28/2023   CO2 16 (L) 12/28/2023   BUN 17 12/28/2023   CREATININE 0.59 12/28/2023   GLUCOSE 104 (H) 12/28/2023   GFRNONAA >60 12/28/2023   GFRAA >60 11/10/2018   CALCIUM  8.7 (L) 12/28/2023   PHOS 2.5 12/28/2023   PROT 4.8 (L) 12/26/2023   ALBUMIN  2.8 (L) 12/28/2023   BILITOT 0.5 12/26/2023   ALKPHOS 55 12/26/2023   AST 70 (H) 12/26/2023   ALT 30 12/26/2023   ANIONGAP 12 12/28/2023    CBG (last 3)  Recent Labs    12/28/23 0453 12/28/23 0809 12/28/23 1154  GLUCAP 134* 97 143*      Coagulation Profile: No results for input(s): "INR", "PROTIME" in the last 168 hours.   Radiology Studies: I have personally reviewed  the imaging studies  No results found.      Bertram Brocks M.D. [facility] 12/28/2023, 12:37 PM  Available via Epic secure chat 7am-7pm After 7 pm, please refer to night coverage provider listed on amion.

## 2023-12-28 NOTE — Progress Notes (Signed)
 Speech Language Pathology Treatment: Dysphagia;Cognitive-Linquistic  Patient Details Name: Meredith Park MRN: [id number] DOB: [birth year] Today's Date: 12/28/2023 Time: 4098-1191 SLP Time Calculation (min) (ACUTE ONLY): 24 min  Assessment / Plan / Recommendation Clinical Impression  Pt seen for dysphagia and dysarthria treatment. She was able to attend to therapist standing on left side when cued majority of the time. Wrist restraint removed pt brushed her teeth prior to po trials. Initial hand over hand use of spoon to mouth fading to occasional cues for coordination. Majority of the time she was able to initiate a timely swallow with applesauce without lingual residue or s/s aspiration. Pt held cup with help to stabilize and was able to use straw with occasional delay in siphoning. Indications of decreased airway protection with explosive cough over 2 out of 4-5 trials.  Pt whispering throughout session and was unable to use voice with verbal cueing alone. After pt coughing with liquids she was able to consistently clear her throat on command with adequate vocalization. Therapist used this to mold/shape pt verbalizing her name when given demonstration of initiating a throat clear gliding straight into verbalization x 2 (name). Therapist suspects whispering may be more behavioral/habit versus physiological (?) although this is subjective.   She appears ready for instrumental assessment of swallow with MBS and not necessarily with FEES. Will recommend sometime first of next week.     HPI HPI: 53 yo female presenting s/p seizure with vomiting. Admitted with acute encephalopathy, acute respiratory failure, septic shock secondary to aspiration PNA. ETT 3/25-4/2; reintubated for stridor and secretions 4/2-4/7. MRI 4/6: diffuse cortical edema and slight increased vascularity to the  right hemisphere, possibly postictal. PMH: HTN, HLD, ETOH abuse hx, seizure. Previous SLUMS 2023: 26/30 (errors primarily  with delayed recall, mild word-finding).      SLP Plan  Continue with current plan of care      Recommendations for follow up therapy are one component of a multi-disciplinary discharge planning process, led by the attending physician.  Recommendations may be updated based on patient status, additional functional criteria and insurance authorization.    Recommendations  Diet recommendations: NPO Medication Administration: Via alternative means                  Oral care QID   Frequent or constant Supervision/Assistance Dysarthria and anarthria (R47.1)     Continue with current plan of care     Meredith Park  12/28/2023, 3:59 PM

## 2023-12-28 NOTE — Progress Notes (Signed)
 Occupational Therapy Treatment Patient Details Name: Meredith Park MRN: [id number] DOB: 29-Jun-1971 Today's Date: 12/28/2023   History of present illness 53 yo female admitted 11/27/23 s/p seizure with acute encephalopathy, acute respiratory failure, septic shock secondary to aspiration PNA. LP 3/26, ETT 3/26-4/2, re-intubated 4/2. Extubated 4/7. PMH: HTN, HLD ETOH abuse hx, seizure   OT comments  Pt LUE remains flaccid, no tone noted today. Worked with pt on attention to L side today and sitting balance as pt has persistent L lateral lean. Removed L resting hand splint with no skin breakdown or irritation noted, she was able to stand with min A +2 but is reliant on ext support for weight shifts and propulsion of LLE. Pt perhaps getting more delirious as she makes obscure comments during session. OT to continue following pt acutely to progress pt as able, would benefit from taping L shoulder next session for increased stability. Patient will benefit from continued inpatient follow up therapy, <3 hours/day       If plan is discharge home, recommend the following:  Two people to help with walking and/or transfers;Assistance with cooking/housework;Assist for transportation;Supervision due to cognitive status;Direct supervision/assist for medications management;Two people to help with bathing/dressing/bathroom;Direct supervision/assist for financial management   Equipment Recommendations  Wheelchair (measurements OT);Wheelchair cushion (measurements OT);Hospital bed;Hoyer lift;BSC/3in1    Recommendations for Other Services      Precautions / Restrictions Precautions Precautions: Fall;Other (comment) Recall of Precautions/Restrictions: Impaired Precaution/Restrictions Comments: rectal pouch,PEG, R wrist restraint Restrictions Weight Bearing Restrictions Per Provider Order: No       Mobility Bed Mobility Overal bed mobility: Needs Assistance Bed Mobility: Rolling, Sidelying to Sit, Sit  to Sidelying Rolling: Max assist, Used rails, +2 for physical assistance Sidelying to sit: Max assist, +2 for physical assistance   Sit to supine: Mod assist, +2 for physical assistance   General bed mobility comments: cues to sequence, pt able to partially assist with RLE and RUE supported on bed rail.    Transfers Overall transfer level: Needs assistance Equipment used: 2 person hand held assist Transfers: Sit to/from Stand Sit to Stand: Min assist, +2 safety/equipment           General transfer comment: able to rise into standing with min A +2, Max A for weight shifts L/R and to have therapist facilitate movement of LLE. Pt performing minor movement of LLE but not enough to offload without assist.     Balance Overall balance assessment: Needs assistance Sitting-balance support: Feet supported Sitting balance-Leahy Scale: Poor Sitting balance - Comments: mod to max A to maintain sititng balance Postural control: Left lateral lean, Posterior lean Standing balance support: Bilateral upper extremity supported, During functional activity Standing balance-Leahy Scale: Poor Standing balance comment: Min A +2 to maintain                           ADL either performed or assessed with clinical judgement   ADL   Eating/Feeding: NPO Eating/Feeding Details (indicate cue type and reason): PEG tube                                   General ADL Comments: Worked with pt on sitting balance, LUE ROM, and attention to L side.    Extremity/Trunk Assessment Upper Extremity Assessment LUE Deficits / Details: LUE grossly flaccid this session, removed resting hand spint to allow break for a few hours,  no noted skin breakdown. LUE propped on pillows, 2" shoulder sublux            Vision       Perception     Praxis     Communication Communication Communication: Impaired Factors Affecting Communication: Difficulty expressing self;Reduced clarity of speech    Cognition Arousal: Alert Behavior During Therapy: Flat affect Cognition: Cognition impaired Difficult to assess due to: Impaired communication   Awareness: Intellectual awareness impaired, Online awareness impaired Memory impairment (select all impairments): Short-term memory, Working memory, Non-declarative long-term memory Attention impairment (select first level of impairment): Focused attention, Sustained attention Executive functioning impairment (select all impairments): Initiation, Organization, Sequencing, Problem solving, Reasoning OT - Cognition Comments: Pt verbalizing with small whispers, reporting "bad people keep coming" and making statements about grandson such as "he's gone."                 Following commands: Impaired Following commands impaired: Follows one step commands inconsistently, Follows one step commands with increased time      Cueing   Cueing Techniques: Verbal cues, Tactile cues, Visual cues  Exercises Other Exercises Other Exercises: dynamic reaching sitting EOB with RUE Other Exercises: location and positiioning of LUE using pt's RUE Other Exercises: Cervical AAROM attempted but limited by impaired attention followed by resistance Other Exercises: LUE PNF D1 flexion/ext, limited by attention.    Shoulder Instructions       General Comments VSS    Pertinent Vitals/ Pain       Pain Assessment Pain Assessment: Faces Faces Pain Scale: Hurts a little bit Pain Location: abdominal binder and PEG tube Pain Descriptors / Indicators: Discomfort Pain Intervention(s): Monitored during session  Home Living                                          Prior Functioning/Environment              Frequency  Min 2X/week        Progress Toward Goals  OT Goals(current goals can now be found in the care plan section)  Progress towards OT goals: Progressing toward goals  Acute Rehab OT Goals Patient Stated Goal: get  better OT Goal Formulation: With patient Time For Goal Achievement: 01/01/24 Potential to Achieve Goals: Fair  Plan      Co-evaluation      Reason for Co-Treatment: Complexity of the patient's impairments (multi-system involvement);For patient/therapist safety;To address functional/ADL transfers   OT goals addressed during session: Strengthening/ROM      AM-PAC OT "6 Clicks" Daily Activity     Outcome Measure   Help from another person eating meals?: Total Help from another person taking care of personal grooming?: A Lot Help from another person toileting, which includes using toliet, bedpan, or urinal?: Total Help from another person bathing (including washing, rinsing, drying)?: A Lot Help from another person to put on and taking off regular upper body clothing?: A Lot Help from another person to put on and taking off regular lower body clothing?: Total 6 Click Score: 9    End of Session    OT Visit Diagnosis: Unsteadiness on feet (R26.81);Muscle weakness (generalized) (M62.81);Other symptoms and signs involving cognitive function;Other symptoms and signs involving the nervous system (R29.898);Other abnormalities of gait and mobility (R26.89);Hemiplegia and hemiparesis Hemiplegia - Right/Left: Left Hemiplegia - dominant/non-dominant: Non-Dominant Hemiplegia - caused by: Unspecified   Activity Tolerance Patient  tolerated treatment well   Patient Left in bed;with call bell/phone within reach;with bed alarm set;with restraints reapplied   Nurse Communication Mobility status        Time: 1610-9604 OT Time Calculation (min): 37 min  Charges: OT General Charges $OT Visit: 1 Visit OT Treatments $Therapeutic Activity: 8-22 mins  12/28/2023  AB, OTR/L  Acute Rehabilitation Services  Office: [phone]   Meredith Park 12/28/2023, 12:41 PM

## 2023-12-28 NOTE — Plan of Care (Signed)
  Problem: Pain Managment: Goal: General experience of comfort will improve and/or be controlled Outcome: Progressing   Problem: Safety: Goal: Ability to remain free from injury will improve Outcome: Progressing   Problem: Skin Integrity: Goal: Risk for impaired skin integrity will decrease Outcome: Progressing

## 2023-12-29 ENCOUNTER — Inpatient Hospital Stay (HOSPITAL_COMMUNITY)

## 2023-12-29 DIAGNOSIS — G40901 Epilepsy, unspecified, not intractable, with status epilepticus: Secondary | ICD-10-CM | POA: Diagnosis not present

## 2023-12-29 DIAGNOSIS — R4182 Altered mental status, unspecified: Secondary | ICD-10-CM | POA: Diagnosis not present

## 2023-12-29 DIAGNOSIS — N179 Acute kidney failure, unspecified: Secondary | ICD-10-CM | POA: Diagnosis not present

## 2023-12-29 LAB — CBC
HCT: 26.6 % — ABNORMAL LOW (ref 36.0–46.0)
Hemoglobin: 8.6 g/dL — ABNORMAL LOW (ref 12.0–15.0)
MCH: 29.7 pg (ref 26.0–34.0)
MCHC: 32.3 g/dL (ref 30.0–36.0)
MCV: 91.7 fL (ref 80.0–100.0)
Platelets: 126 10*3/uL — ABNORMAL LOW (ref 150–400)
RBC: 2.9 MIL/uL — ABNORMAL LOW (ref 3.87–5.11)
RDW: 18.9 % — ABNORMAL HIGH (ref 11.5–15.5)
WBC: 14.1 10*3/uL — ABNORMAL HIGH (ref 4.0–10.5)
nRBC: 0 % (ref 0.0–0.2)

## 2023-12-29 LAB — RENAL FUNCTION PANEL
Albumin: 2.8 g/dL — ABNORMAL LOW (ref 3.5–5.0)
Anion gap: 10 (ref 5–15)
BUN: 23 mg/dL — ABNORMAL HIGH (ref 6–20)
CO2: 20 mmol/L — ABNORMAL LOW (ref 22–32)
Calcium: 9.2 mg/dL (ref 8.9–10.3)
Chloride: 107 mmol/L (ref 98–111)
Creatinine, Ser: 0.65 mg/dL (ref 0.44–1.00)
GFR, Estimated: >60 mL/min (ref >60)
Glucose, Bld: 136 mg/dL — ABNORMAL HIGH (ref 70–99)
Phosphorus: 3.5 mg/dL (ref 2.5–4.6)
Potassium: 3.9 mmol/L (ref 3.5–5.1)
Sodium: 137 mmol/L (ref 135–145)

## 2023-12-29 LAB — GLUCOSE, CAPILLARY
Glucose-Capillary: 106 mg/dL — ABNORMAL HIGH (ref 70–99)
Glucose-Capillary: 109 mg/dL — ABNORMAL HIGH (ref 70–99)
Glucose-Capillary: 111 mg/dL — ABNORMAL HIGH (ref 70–99)
Glucose-Capillary: 112 mg/dL — ABNORMAL HIGH (ref 70–99)
Glucose-Capillary: 125 mg/dL — ABNORMAL HIGH (ref 70–99)
Glucose-Capillary: 147 mg/dL — ABNORMAL HIGH (ref 70–99)

## 2023-12-29 MED ORDER — METOCLOPRAMIDE HCL 5 MG/ML IJ SOLN
5.0000 mg | Freq: Four times a day (QID) | INTRAMUSCULAR | Status: DC
  Administered 2023-12-29 – 2023-12-31 (×8): 5 mg via INTRAVENOUS
  Filled 2023-12-29 (×8): qty 2

## 2023-12-29 NOTE — Plan of Care (Signed)
  Problem: Education: Goal: Knowledge of General Education information will improve Description: Including pain rating scale, medication(s)/side effects and non-pharmacologic comfort measures Outcome: Progressing   Problem: Health Behavior/Discharge Planning: Goal: Ability to manage health-related needs will improve Outcome: Progressing   Problem: Clinical Measurements: Goal: Ability to maintain clinical measurements within normal limits will improve Outcome: Progressing Goal: Will remain free from infection Outcome: Progressing Goal: Diagnostic test results will improve Outcome: Progressing Goal: Respiratory complications will improve Outcome: Progressing Goal: Cardiovascular complication will be avoided Outcome: Progressing   Problem: Activity: Goal: Risk for activity intolerance will decrease Outcome: Progressing   Problem: Nutrition: Goal: Adequate nutrition will be maintained Outcome: Progressing   Problem: Coping: Goal: Level of anxiety will decrease Outcome: Progressing   Problem: Elimination: Goal: Will not experience complications related to bowel motility Outcome: Progressing Goal: Will not experience complications related to urinary retention Outcome: Progressing   Problem: Pain Managment: Goal: General experience of comfort will improve and/or be controlled Outcome: Progressing   Problem: Safety: Goal: Ability to remain free from injury will improve Outcome: Progressing   Problem: Skin Integrity: Goal: Risk for impaired skin integrity will decrease Outcome: Progressing   Problem: Activity: Goal: Ability to tolerate increased activity will improve Outcome: Progressing   Problem: Respiratory: Goal: Ability to maintain a clear airway and adequate ventilation will improve Outcome: Progressing   Problem: Role Relationship: Goal: Method of communication will improve Outcome: Progressing   Problem: Education: Goal: Ability to describe self-care  measures that may prevent or decrease complications (Diabetes Survival Skills Education) will improve Outcome: Progressing Goal: Individualized Educational Video(s) Outcome: Progressing   Problem: Coping: Goal: Ability to adjust to condition or change in health will improve Outcome: Progressing   Problem: Fluid Volume: Goal: Ability to maintain a balanced intake and output will improve Outcome: Progressing   Problem: Health Behavior/Discharge Planning: Goal: Ability to identify and utilize available resources and services will improve Outcome: Progressing Goal: Ability to manage health-related needs will improve Outcome: Progressing   Problem: Metabolic: Goal: Ability to maintain appropriate glucose levels will improve Outcome: Progressing   Problem: Nutritional: Goal: Maintenance of adequate nutrition will improve Outcome: Progressing Goal: Progress toward achieving an optimal weight will improve Outcome: Progressing   Problem: Skin Integrity: Goal: Risk for impaired skin integrity will decrease Outcome: Progressing   Problem: Tissue Perfusion: Goal: Adequacy of tissue perfusion will improve Outcome: Progressing   Problem: Safety: Goal: Non-violent Restraint(s) Outcome: Progressing   Problem: Education: Goal: Knowledge of disease or condition will improve Outcome: Progressing Goal: Knowledge of secondary prevention will improve (MUST DOCUMENT ALL) Outcome: Progressing Goal: Knowledge of patient specific risk factors will improve (DELETE if not current risk factor) Outcome: Progressing   Problem: Ischemic Stroke/TIA Tissue Perfusion: Goal: Complications of ischemic stroke/TIA will be minimized Outcome: Progressing   Problem: Coping: Goal: Will verbalize positive feelings about self Outcome: Progressing Goal: Will identify appropriate support needs Outcome: Progressing   Problem: Health Behavior/Discharge Planning: Goal: Ability to manage health-related  needs will improve Outcome: Progressing Goal: Goals will be collaboratively established with patient/family Outcome: Progressing   Problem: Self-Care: Goal: Ability to participate in self-care as condition permits will improve Outcome: Progressing Goal: Verbalization of feelings and concerns over difficulty with self-care will improve Outcome: Progressing Goal: Ability to communicate needs accurately will improve Outcome: Progressing   Problem: Nutrition: Goal: Risk of aspiration will decrease Outcome: Progressing Goal: Dietary intake will improve Outcome: Progressing

## 2023-12-29 NOTE — Progress Notes (Signed)
 [facility]                                                                              Meredith Park, is a 54 y.o. female, DOB - June 01, 1971, ZOX:[id number] Admit date - 11/27/2023    Outpatient Primary MD for the patient is Fulbright, [place]  E, PA-C  LOS - 32  days  Chief Complaint  Patient presents with   Seizures       Brief summary   53 year old female with history of alcohol abuse, HLD, seizures, hypertension, abstinence from alcohol for last 1 year, found down beside her bed by her family members, was noted to be shaking and unresponsive.  EMS was called and was found to be in generalized tonic-clonic seizures, treated with Versed  with cessation of seizures, remained minimally responsive in the ED.  Hypoxemic requiring 10 L O2 and was intubated to protect airway and admitted to the ICU. Significant events as below. 3/25 admit to ICU.  Hypoxemic and unresponsive.  Intubated 3/26 LP performed. Increasing pressor requirement 3/27 Increased vasopressor requirement with worsening MOD including AKI, shock liver. Echo with stress CM. 3/29 Improved vasopressor requirements 4/2 central line d/c, extubated (had cuff leak), reintubated for stridor & secretions several hours later 4/5 CTH w/progression of unihemispheric edema, thought more c/w seizure changes 4/6 LTM, steroids, 4/7 extubated, intermittently on predex for agitation, LTM d/c'd, tmax 101.6/ WBC stable 4/10 on room air.  Transferred to medical floor.  Core track feeding. 4/11 full of secretions unable to clear , requiring non- rebreather, transferred back to ICU 4/14: Transferred out of ICU, [facility] 4/17 patient working with SLP making progress. PEG placement requested.  4/18 MRI brain reviewed and neurology signed off. 4/21 IR unable to place the PEG due to colon / safe window. Gen surgery consulted. 4/22 surgical PEG placement today. Overnight patient had some blood mixed with stool in flexi seal positive for  occult blood. GI consulted for recommendations.    Assessment & Plan      Status epilepticus (HCC) in the setting of history of seizure disorder - Acute encephalopathy secondary to status epilepticus - Right cerebral hemisphere edema consistent with postictal state, still has left-sided weakness -Neurology following suspect this is postsurgical versus potential autoimmune/paraneoplastic/vasculitis. -CT of the chest abdomen and pelvis without any signs of malignancy -Neurology recommends to continue with Topamax  100 mg daily, lamotrigine  250 mg BID and DC Vimpat  and  to continue with Prednisone  taper by 10 mg every week. -Currently she is on prednisone  40 mg starting from 4/22. - Repeat MRI brain showed recent punctate infarcts in the left temporal subcortical white matter and superior right cerebellum. Evolutionary changes throughout the right cerebral and left cerebellar hemispheres as described above most compatible with evolving injury from recent status epilepticus. -  on aspirin  81 mg daily and lipitor 40 mg daily for secondary stroke prevention.       Acute hypoxic respiratory failure secondary to aspiration versus volume overload -Patient was initially intubated for status epilepticus and airway protection, subsequently reintubated due to aspiration versus volume overload, has been extubated - On room air, completed course of antibiotics.  -  echocardiogram showed LVEF of 40 to 45%, left ventricle demonstrates regional wall abnormalities.      Dysphagia/ Nutrition -Patient was placed on core track, failed SLP evaluation -Palliative medicine consulted for goals of care/PEG tubes:  Continue with full scope treatments with aggressive life prolonging interventions including PEG.  -IR unable to place PEG due to no safe window.  - Surgery consulted and underwent EGD and PEG tube placement on 4/22, Tube feeds started -Per GI today, no intervention is needed as endoscopy was completed by  surgery on 4/22 and was unremarkable.  - Complaining of abdominal pain today, KUB shows ileus. -Hold tube feeds, placed on scheduled Reglan  5 mg every 6 hours, bowel regimen     New onset cardiomyopathy, elevated troponin likely due to demand ischemia Hypertension - 2D echo 11/28/2023 showed EF of 40 to 45%, G1 DD, regional WMA -Seen by cardiology, recommending conservative management for now.  - Continue Coreg , cardiology recommended repeat echo in near future and if she has meaningful neurological recovery, please reach out to cardiology for further work up of new onset cardiomyopathy.  - currently on coreg  and losartan .        Acute Kidney injury secondary to rhabdomyolysis, ATN, metabolic acidosis Creatinine is back to baseline,  - Bicarb improving, continue sodium bicarb 650 mg 3 times daily  Agitation, acute encephalopathy - Continue Seroquel  50 mg at bedtime, currently has restraints - Tolerating a.m. Seroquel  12.5 mg daily   Hypernatremia.  Resolved.      Anemia of acute illness/ guaiac positive stools:  Per GI, no intervention needed as endoscopy was completed by surgery on 4/22 and was unremarkable Continue PPI  - H&H stable   Acute transaminitis Improving     Hyperglycemia probably secondary to steroids and tube feeds CBG (last 3)  Recent Labs    12/29/23 0333 12/29/23 0728 12/29/23 1126  GLUCAP 111* 112* 147*   Continue sliding scale insulin      Leukocytosis  -Likely from steroids   Hypotension on 4/21  From iv fentanyl  and versad Ordered fluid bolus with RL,  BP parameters improved.   Severe protein calorie malnutrition Nutrition Problem: Severe Malnutrition Etiology: social / environmental circumstances Signs/Symptoms: severe muscle depletion, percent weight loss  Interventions: Prostat, Tube feeding, MVI  Estimated body mass index is 25.08 kg/m as calculated from the following:   Height as of this encounter: 5\' 2"  (1.575 m).   Weight as  of this encounter: 62.2 kg.  Code Status: Full code DVT Prophylaxis:  heparin  injection 5,000 Units Start: 12/04/23 0930 SCDs Start: 11/27/23 2310   Level of Care: Level of care: Progressive Family Communication: Updated patient Disposition Plan:      Remains inpatient appropriate:   DC to SNF once off the restraints   Procedures:  PEG tube placement on 4/22 by surgery  Consultants:   General Surgery Neurology IR  Antimicrobials:   Anti-infectives (From admission, onward)    Start     Dose/Rate Route Frequency Ordered Stop   12/24/23 0910  ceFAZolin  (ANCEF ) IVPB 2g/100 mL premix        over 30 Minutes Intravenous Continuous PRN 12/24/23 0911 12/24/23 0910   12/14/23 1245  Ampicillin -Sulbactam (UNASYN ) 3 g in sodium chloride  0.9 % 100 mL IVPB        3 g 200 mL/hr over 30 Minutes Intravenous Every 6 hours 12/14/23 1154 12/19/23 0640   12/04/23 0930  piperacillin -tazobactam (ZOSYN ) IVPB 3.375 g  Status:  Discontinued  3.375 g 12.5 mL/hr over 240 Minutes Intravenous Every 8 hours 12/04/23 0839 12/10/23 1048   11/30/23 1000  cefTRIAXone  (ROCEPHIN ) 2 g in sodium chloride  0.9 % 100 mL IVPB  Status:  Discontinued        2 g 200 mL/hr over 30 Minutes Intravenous Every 24 hours 11/29/23 1006 12/04/23 0830   11/29/23 1000  vancomycin  (VANCOREADY) IVPB 750 mg/150 mL  Status:  Discontinued        750 mg 150 mL/hr over 60 Minutes Intravenous Every 24 hours 11/28/23 0245 11/29/23 1006   11/28/23 2200  acyclovir  (ZOVIRAX ) 630 mg in dextrose  5 % 100 mL IVPB  Status:  Discontinued        10 mg/kg  63 kg 112.6 mL/hr over 60 Minutes Intravenous Every 24 hours 11/28/23 0247 11/29/23 0836   11/28/23 1000  ampicillin  (OMNIPEN) 2 g in sodium chloride  0.9 % 100 mL IVPB  Status:  Discontinued        2 g 300 mL/hr over 20 Minutes Intravenous Every 8 hours 11/28/23 0249 11/29/23 1006   11/28/23 0115  vancomycin  (VANCOREADY) IVPB 2000 mg/400 mL        2,000 mg 200 mL/hr over 120 Minutes  Intravenous  Once 11/28/23 0028 11/28/23 0308   11/28/23 0115  cefTRIAXone  (ROCEPHIN ) 2 g in sodium chloride  0.9 % 100 mL IVPB  Status:  Discontinued        2 g 200 mL/hr over 30 Minutes Intravenous Every 12 hours 11/28/23 0028 11/29/23 1006   11/28/23 0115  ampicillin  (OMNIPEN) 2 g in sodium chloride  0.9 % 100 mL IVPB        2 g 300 mL/hr over 20 Minutes Intravenous  Once 11/28/23 0028 11/28/23 0129   11/28/23 0100  acyclovir  (ZOVIRAX ) 700 mg in dextrose  5 % 100 mL IVPB        700 mg 114 mL/hr over 60 Minutes Intravenous  Once 11/28/23 0029 11/28/23 0301          Medications  aspirin   81 mg Per Tube Daily   atorvastatin   40 mg Per Tube Daily   carvedilol   3.125 mg Per Tube BID WC   Chlorhexidine  Gluconate Cloth  6 each Topical Daily   feeding supplement (PROSource TF20)  60 mL Per Tube Daily   fiber supplement (BANATROL TF)  60 mL Per Tube BID   folic acid   1 mg Per Tube Daily   free water   200 mL Per Tube Q4H   heparin  injection (subcutaneous)  5,000 Units Subcutaneous Q8H   insulin  aspart  0-9 Units Subcutaneous Q4H   lamoTRIgine   250 mg Per Tube BID   losartan   25 mg Per Tube Daily   metoCLOPramide  (REGLAN ) injection  5 mg Intravenous Q6H   multivitamin with minerals  1 tablet Per Tube Daily   nystatin  cream   Topical BID   mouth rinse  15 mL Mouth Rinse 4 times per day   pantoprazole  (PROTONIX ) IV  40 mg Intravenous Q24H   predniSONE   40 mg Per Tube Q breakfast   Followed by   Cecily Cohen ON 01/01/2024] predniSONE   30 mg Per Tube Q breakfast   Followed by   Cecily Cohen ON 01/08/2024] predniSONE   20 mg Per Tube Q breakfast   Followed by   Cecily Cohen ON 01/15/2024] predniSONE   10 mg Per Tube Q breakfast   QUEtiapine   12.5 mg Per Tube q AM   QUEtiapine   50 mg Per Tube QHS   sertraline   25 mg  Per Tube QHS   sodium bicarbonate   650 mg Per Tube TID   thiamine   100 mg Per Tube Daily   topiramate   100 mg Per Tube QHS      Subjective:   Meredith Park was seen and examined today.   Today complaining of abdominal pain.  No nausea vomiting, fevers or chills.  Persistent left-sided weakness.  On tube feeds   Objective:   Vitals:   12/29/23 0334 12/29/23 0417 12/29/23 0727 12/29/23 1129  BP: 123/85  126/85 100/69  Pulse: 80  90 89  Resp: 20  18 17   Temp: 98.4 F (36.9 C)  99.2 F (37.3 C) 98.8 F (37.1 C)  TempSrc:   Oral Axillary  SpO2: 99%  100% 98%  Weight:  62.2 kg    Height:        Intake/Output Summary (Last 24 hours) at 12/29/2023 1251 Last data filed at 12/29/2023 0415 Gross per 24 hour  Intake --  Output 1100 ml  Net -1100 ml     Wt Readings from Last 3 Encounters:  12/29/23 62.2 kg  11/23/22 105.9 kg  11/22/22 105.9 kg    Physical Exam General: Alert and oriented x 3, NAD Cardiovascular: S1 S2 clear, RRR.  Respiratory: CTAB, no wheezing Gastrointestinal: Soft, NT, hypoactive BS Ext: no pedal edema bilaterally, heels in protectors bilaterally Neuro: left-sided weakness Psych: Normal affect   Data Reviewed:  I have personally reviewed following labs    CBC Lab Results  Component Value Date   WBC 14.1 (H) 12/29/2023   RBC 2.90 (L) 12/29/2023   HGB 8.6 (L) 12/29/2023   HCT 26.6 (L) 12/29/2023   MCV 91.7 12/29/2023   MCH 29.7 12/29/2023   PLT 126 (L) 12/29/2023   MCHC 32.3 12/29/2023   RDW 18.9 (H) 12/29/2023   LYMPHSABS 1.5 12/26/2023   MONOABS 0.4 12/26/2023   EOSABS 0.1 12/26/2023   BASOSABS 0.0 12/26/2023     Last metabolic panel Lab Results  Component Value Date   NA 137 12/29/2023   K 3.9 12/29/2023   CL 107 12/29/2023   CO2 20 (L) 12/29/2023   BUN 23 (H) 12/29/2023   CREATININE 0.65 12/29/2023   GLUCOSE 136 (H) 12/29/2023   GFRNONAA >60 12/29/2023   GFRAA >60 11/10/2018   CALCIUM  9.2 12/29/2023   PHOS 3.5 12/29/2023   PROT 4.8 (L) 12/26/2023   ALBUMIN  2.8 (L) 12/29/2023   BILITOT 0.5 12/26/2023   ALKPHOS 55 12/26/2023   AST 70 (H) 12/26/2023   ALT 30 12/26/2023   ANIONGAP 10 12/29/2023    CBG (last 3)   Recent Labs    12/29/23 0333 12/29/23 0728 12/29/23 1126  GLUCAP 111* 112* 147*      Coagulation Profile: No results for input(s): "INR", "PROTIME" in the last 168 hours.   Radiology Studies: I have personally reviewed the imaging studies  DG Abd Portable 1V Result Date: 12/29/2023 CLINICAL DATA:  Abdominal pain EXAM: PORTABLE ABDOMEN - 1 VIEW COMPARISON:  12/24/2023 FINDINGS: Left upper quadrant gastrostomy tube. Gaseous distension of the colon is again seen in appears unchanged from previous exam. Mild small bowel dilatation within the right lower quadrant is improved in the interval. IMPRESSION: 1. Persistent gaseous distension of the colon. 2. Improved small bowel dilatation within the right lower quadrant. Electronically Signed   By: Kimberley Penman M.D.   On: 12/29/2023 08:48        Meztli Llanas M.D. [facility] 12/29/2023, 12:51 PM  Available via Epic secure  chat 7am-7pm After 7 pm, please refer to night coverage provider listed on amion.

## 2023-12-29 NOTE — Progress Notes (Addendum)
 Tube feed put on hold per order by Dr Thelma Fire. OK to administer meds via peg tube.

## 2023-12-29 NOTE — Plan of Care (Signed)
  Problem: Clinical Measurements: Goal: Respiratory complications will improve Outcome: Progressing   Problem: Clinical Measurements: Goal: Will remain free from infection Outcome: Progressing   

## 2023-12-30 DIAGNOSIS — N179 Acute kidney failure, unspecified: Secondary | ICD-10-CM | POA: Diagnosis not present

## 2023-12-30 DIAGNOSIS — G40901 Epilepsy, unspecified, not intractable, with status epilepticus: Secondary | ICD-10-CM | POA: Diagnosis not present

## 2023-12-30 DIAGNOSIS — R4182 Altered mental status, unspecified: Secondary | ICD-10-CM | POA: Diagnosis not present

## 2023-12-30 LAB — RENAL FUNCTION PANEL
Albumin: 2.8 g/dL — ABNORMAL LOW (ref 3.5–5.0)
Anion gap: 12 (ref 5–15)
BUN: 22 mg/dL — ABNORMAL HIGH (ref 6–20)
CO2: 16 mmol/L — ABNORMAL LOW (ref 22–32)
Calcium: 9.2 mg/dL (ref 8.9–10.3)
Chloride: 110 mmol/L (ref 98–111)
Creatinine, Ser: 0.68 mg/dL (ref 0.44–1.00)
GFR, Estimated: >60 mL/min (ref >60)
Glucose, Bld: 81 mg/dL (ref 70–99)
Phosphorus: 4.6 mg/dL (ref 2.5–4.6)
Potassium: 4 mmol/L (ref 3.5–5.1)
Sodium: 138 mmol/L (ref 135–145)

## 2023-12-30 LAB — GLUCOSE, CAPILLARY
Glucose-Capillary: 84 mg/dL (ref 70–99)
Glucose-Capillary: 74 mg/dL (ref 70–99)
Glucose-Capillary: 96 mg/dL (ref 70–99)
Glucose-Capillary: 113 mg/dL — ABNORMAL HIGH (ref 70–99)

## 2023-12-30 NOTE — Progress Notes (Signed)
 [facility]                                                                              Meredith Park, is a 53 y.o. female, DOB - 1971/03/25, ZOX:[id number] Admit date - 11/27/2023    Outpatient Primary MD for the patient is Fulbright, [place]  E, PA-C  LOS - 33  days  Chief Complaint  Patient presents with   Seizures       Brief summary   53 year old female with history of alcohol abuse, HLD, seizures, hypertension, abstinence from alcohol for last 1 year, found down beside her bed by her family members, was noted to be shaking and unresponsive.  EMS was called and was found to be in generalized tonic-clonic seizures, treated with Versed  with cessation of seizures, remained minimally responsive in the ED.  Hypoxemic requiring 10 L O2 and was intubated to protect airway and admitted to the ICU. Significant events as below. 3/25 admit to ICU.  Hypoxemic and unresponsive.  Intubated 3/26 LP performed. Increasing pressor requirement 3/27 Increased vasopressor requirement with worsening MOD including AKI, shock liver. Echo with stress CM. 3/29 Improved vasopressor requirements 4/2 central line d/c, extubated (had cuff leak), reintubated for stridor & secretions several hours later 4/5 CTH w/progression of unihemispheric edema, thought more c/w seizure changes 4/6 LTM, steroids, 4/7 extubated, intermittently on predex for agitation, LTM d/c'd, tmax 101.6/ WBC stable 4/10 on room air.  Transferred to medical floor.  Core track feeding. 4/11 full of secretions unable to clear , requiring non- rebreather, transferred back to ICU 4/14: Transferred out of ICU, [facility] 4/17 patient working with SLP making progress. PEG placement requested.  4/18 MRI brain reviewed and neurology signed off. 4/21 IR unable to place the PEG due to colon / safe window. Gen surgery consulted. 4/22 surgical PEG placement today. Overnight patient had some blood mixed with stool in flexi seal positive for  occult blood. GI consulted for recommendations.  4/26: Abdominal pain, KUB: Ileus, tube feeds held   Assessment & Plan      Status epilepticus (HCC) in the setting of history of seizure disorder - Acute encephalopathy secondary to status epilepticus - Right cerebral hemisphere edema consistent with postictal state, still has left-sided weakness -Neurology following suspect this is postsurgical versus potential autoimmune/paraneoplastic/vasculitis. -CT of the chest abdomen and pelvis without any signs of malignancy -Neurology recommends to continue with Topamax  100 mg daily, lamotrigine  250 mg BID and DC Vimpat  and to continue with Prednisone  taper by 10 mg every week. -Currently she is on prednisone  40 mg starting from 4/22, taper by 10 mg weekly. - Repeat MRI brain showed recent punctate infarcts in the left temporal subcortical white matter and superior right cerebellum. Evolutionary changes throughout the right cerebral and left cerebellar hemispheres as described above most compatible with evolving injury from recent status epilepticus. -  on aspirin  81 mg daily and lipitor 40 mg daily for secondary stroke prevention.       Acute hypoxic respiratory failure secondary to aspiration versus volume overload -Patient was initially intubated for status epilepticus and airway protection, subsequently reintubated due to aspiration versus volume overload, has  been extubated - On room air, completed course of antibiotics.  - echocardiogram showed LVEF of 40 to 45%, left ventricle demonstrates regional wall abnormalities.      Dysphagia/ Nutrition -Patient was placed on core track, failed SLP evaluation -Palliative medicine consulted for goals of care/PEG tubes:  Continue with full scope treatments with aggressive life prolonging interventions including PEG.  -IR unable to place PEG due to no safe window.  - Surgery consulted and underwent EGD and PEG tube placement on 4/22, Tube feeds  started -Per GI today, no intervention is needed as endoscopy was completed by surgery on 4/22 and was unremarkable. -No further abdominal pain, will resume tube feeds at 20 cc an hour, increase by 10 cc every 4 hours to the goal rate.     New onset cardiomyopathy, elevated troponin likely due to demand ischemia Hypertension - 2D echo 11/28/2023 showed EF of 40 to 45%, G1 DD, regional WMA -Seen by cardiology, recommending conservative management for now.  - Continue Coreg , cardiology recommended repeat echo in near future and if she has meaningful neurological recovery to reach out to cardiology for further work up of new onset cardiomyopathy.  - currently on coreg  and losartan .       Acute Kidney injury secondary to rhabdomyolysis, ATN, metabolic acidosis Creatinine is back to baseline,  - Continue sodium bicarb 650 mg 3 times daily   Agitation, acute encephalopathy - Continue Seroquel  50 mg at bedtime - Continue Seroquel  Seroquel  12.5 mg daily am  Hypernatremia.  Resolved.      Anemia of acute illness/ guaiac positive stools:  Per GI, no intervention needed as endoscopy was completed by surgery on 4/22 and was unremarkable Continue PPI  - H&H stable   Acute transaminitis Improving     Hyperglycemia probably secondary to steroids and tube feeds CBG (last 3)  Recent Labs    12/29/23 2332 12/30/23 0405 12/30/23 0758  GLUCAP 106* 84 74   Continue sliding scale insulin      Leukocytosis  -Likely from steroids   Hypotension on 4/21  From iv fentanyl  and versad Ordered fluid bolus with RL,  BP parameters improved.   Severe protein calorie malnutrition Nutrition Problem: Severe Malnutrition Etiology: social / environmental circumstances Signs/Symptoms: severe muscle depletion, percent weight loss  Interventions: Prostat, Tube feeding, MVI  Estimated body mass index is 25.08 kg/m as calculated from the following:   Height as of this encounter: 5\' 2"  (1.575 m).    Weight as of this encounter: 62.2 kg.  Code Status: Full code DVT Prophylaxis:  heparin  injection 5,000 Units Start: 12/04/23 0930 SCDs Start: 11/27/23 2310   Level of Care: Level of care: Progressive Family Communication: Updated patient Disposition Plan:      Remains inpatient appropriate:   DC to SNF once off the restraints   Procedures:  PEG tube placement on 4/22 by surgery  Consultants:   General Surgery Neurology IR  Antimicrobials:   Anti-infectives (From admission, onward)    Start     Dose/Rate Route Frequency Ordered Stop   12/24/23 0910  ceFAZolin  (ANCEF ) IVPB 2g/100 mL premix        over 30 Minutes Intravenous Continuous PRN 12/24/23 0911 12/24/23 0910   12/14/23 1245  Ampicillin -Sulbactam (UNASYN ) 3 g in sodium chloride  0.9 % 100 mL IVPB        3 g 200 mL/hr over 30 Minutes Intravenous Every 6 hours 12/14/23 1154 12/19/23 0640   12/04/23 0930  piperacillin -tazobactam (ZOSYN ) IVPB 3.375 g  Status:  Discontinued        3.375 g 12.5 mL/hr over 240 Minutes Intravenous Every 8 hours 12/04/23 0839 12/10/23 1048   11/30/23 1000  cefTRIAXone  (ROCEPHIN ) 2 g in sodium chloride  0.9 % 100 mL IVPB  Status:  Discontinued        2 g 200 mL/hr over 30 Minutes Intravenous Every 24 hours 11/29/23 1006 12/04/23 0830   11/29/23 1000  vancomycin  (VANCOREADY) IVPB 750 mg/150 mL  Status:  Discontinued        750 mg 150 mL/hr over 60 Minutes Intravenous Every 24 hours 11/28/23 0245 11/29/23 1006   11/28/23 2200  acyclovir  (ZOVIRAX ) 630 mg in dextrose  5 % 100 mL IVPB  Status:  Discontinued        10 mg/kg  63 kg 112.6 mL/hr over 60 Minutes Intravenous Every 24 hours 11/28/23 0247 11/29/23 0836   11/28/23 1000  ampicillin  (OMNIPEN) 2 g in sodium chloride  0.9 % 100 mL IVPB  Status:  Discontinued        2 g 300 mL/hr over 20 Minutes Intravenous Every 8 hours 11/28/23 0249 11/29/23 1006   11/28/23 0115  vancomycin  (VANCOREADY) IVPB 2000 mg/400 mL        2,000 mg 200 mL/hr over 120  Minutes Intravenous  Once 11/28/23 0028 11/28/23 0308   11/28/23 0115  cefTRIAXone  (ROCEPHIN ) 2 g in sodium chloride  0.9 % 100 mL IVPB  Status:  Discontinued        2 g 200 mL/hr over 30 Minutes Intravenous Every 12 hours 11/28/23 0028 11/29/23 1006   11/28/23 0115  ampicillin  (OMNIPEN) 2 g in sodium chloride  0.9 % 100 mL IVPB        2 g 300 mL/hr over 20 Minutes Intravenous  Once 11/28/23 0028 11/28/23 0129   11/28/23 0100  acyclovir  (ZOVIRAX ) 700 mg in dextrose  5 % 100 mL IVPB        700 mg 114 mL/hr over 60 Minutes Intravenous  Once 11/28/23 0029 11/28/23 0301          Medications  aspirin   81 mg Per Tube Daily   atorvastatin   40 mg Per Tube Daily   carvedilol   3.125 mg Per Tube BID WC   Chlorhexidine  Gluconate Cloth  6 each Topical Daily   feeding supplement (PROSource TF20)  60 mL Per Tube Daily   fiber supplement (BANATROL TF)  60 mL Per Tube BID   folic acid   1 mg Per Tube Daily   free water   200 mL Per Tube Q4H   heparin  injection (subcutaneous)  5,000 Units Subcutaneous Q8H   insulin  aspart  0-9 Units Subcutaneous Q4H   lamoTRIgine   250 mg Per Tube BID   losartan   25 mg Per Tube Daily   metoCLOPramide  (REGLAN ) injection  5 mg Intravenous Q6H   multivitamin with minerals  1 tablet Per Tube Daily   nystatin  cream   Topical BID   mouth rinse  15 mL Mouth Rinse 4 times per day   pantoprazole  (PROTONIX ) IV  40 mg Intravenous Q24H   predniSONE   40 mg Per Tube Q breakfast   Followed by   Cecily Cohen ON 01/01/2024] predniSONE   30 mg Per Tube Q breakfast   Followed by   Cecily Cohen ON 01/08/2024] predniSONE   20 mg Per Tube Q breakfast   Followed by   Cecily Cohen ON 01/15/2024] predniSONE   10 mg Per Tube Q breakfast   QUEtiapine   12.5 mg Per Tube q AM   QUEtiapine   50 mg Per  Tube QHS   sertraline   25 mg Per Tube QHS   sodium bicarbonate   650 mg Per Tube TID   thiamine   100 mg Per Tube Daily   topiramate   100 mg Per Tube QHS      Subjective:   Meredith Park was seen and examined  today.  Feels better today, states no abdominal pain.  No nausea vomiting, fevers.  Persistent left-sided weakness.  Tube feeds currently on hold.    Objective:   Vitals:   12/30/23 0400 12/30/23 0500 12/30/23 0756 12/30/23 0833  BP: 109/81  122/88 122/88  Pulse: 71  70 70  Resp: 18  16   Temp: 98.6 F (37 C)  98 F (36.7 C)   TempSrc: Oral  Oral   SpO2: 100%  100%   Weight:  62.2 kg    Height:        Intake/Output Summary (Last 24 hours) at 12/30/2023 1024 Last data filed at 12/30/2023 0401 Gross per 24 hour  Intake 1675 ml  Output 1100 ml  Net 575 ml     Wt Readings from Last 3 Encounters:  12/30/23 62.2 kg  11/23/22 105.9 kg  11/22/22 105.9 kg   Physical Exam General: Alert and oriented, NAD Cardiovascular: S1 S2 clear, RRR.  Respiratory: CTAB Gastrointestinal: Soft, nontender, nondistended, NBS Ext: no pedal edema bilaterally Neuro: left sided hemiplegia Psych: flat affect  Data Reviewed:  I have personally reviewed following labs    CBC Lab Results  Component Value Date   WBC 14.1 (H) 12/29/2023   RBC 2.90 (L) 12/29/2023   HGB 8.6 (L) 12/29/2023   HCT 26.6 (L) 12/29/2023   MCV 91.7 12/29/2023   MCH 29.7 12/29/2023   PLT 126 (L) 12/29/2023   MCHC 32.3 12/29/2023   RDW 18.9 (H) 12/29/2023   LYMPHSABS 1.5 12/26/2023   MONOABS 0.4 12/26/2023   EOSABS 0.1 12/26/2023   BASOSABS 0.0 12/26/2023     Last metabolic panel Lab Results  Component Value Date   NA 138 12/30/2023   K 4.0 12/30/2023   CL 110 12/30/2023   CO2 16 (L) 12/30/2023   BUN 22 (H) 12/30/2023   CREATININE 0.68 12/30/2023   GLUCOSE 81 12/30/2023   GFRNONAA >60 12/30/2023   GFRAA >60 11/10/2018   CALCIUM  9.2 12/30/2023   PHOS 4.6 12/30/2023   PROT 4.8 (L) 12/26/2023   ALBUMIN  2.8 (L) 12/30/2023   BILITOT 0.5 12/26/2023   ALKPHOS 55 12/26/2023   AST 70 (H) 12/26/2023   ALT 30 12/26/2023   ANIONGAP 12 12/30/2023    CBG (last 3)  Recent Labs    12/29/23 2332 12/30/23 0405  12/30/23 0758  GLUCAP 106* 84 74      Coagulation Profile: No results for input(s): "INR", "PROTIME" in the last 168 hours.   Radiology Studies: I have personally reviewed the imaging studies  DG Abd Portable 1V Result Date: 12/29/2023 CLINICAL DATA:  Abdominal pain EXAM: PORTABLE ABDOMEN - 1 VIEW COMPARISON:  12/24/2023 FINDINGS: Left upper quadrant gastrostomy tube. Gaseous distension of the colon is again seen in appears unchanged from previous exam. Mild small bowel dilatation within the right lower quadrant is improved in the interval. IMPRESSION: 1. Persistent gaseous distension of the colon. 2. Improved small bowel dilatation within the right lower quadrant. Electronically Signed   By: Kimberley Penman M.D.   On: 12/29/2023 08:48        Aedyn Mckeon M.D. [facility] 12/30/2023, 10:24 AM  Available via Epic secure chat  7am-7pm After 7 pm, please refer to night coverage provider listed on amion.

## 2023-12-31 ENCOUNTER — Inpatient Hospital Stay (HOSPITAL_COMMUNITY)

## 2023-12-31 DIAGNOSIS — G40901 Epilepsy, unspecified, not intractable, with status epilepticus: Secondary | ICD-10-CM | POA: Diagnosis not present

## 2023-12-31 DIAGNOSIS — R4182 Altered mental status, unspecified: Secondary | ICD-10-CM | POA: Diagnosis not present

## 2023-12-31 DIAGNOSIS — N179 Acute kidney failure, unspecified: Secondary | ICD-10-CM | POA: Diagnosis not present

## 2023-12-31 LAB — GLUCOSE, CAPILLARY
Glucose-Capillary: 110 mg/dL — ABNORMAL HIGH (ref 70–99)
Glucose-Capillary: 112 mg/dL — ABNORMAL HIGH (ref 70–99)
Glucose-Capillary: 113 mg/dL — ABNORMAL HIGH (ref 70–99)
Glucose-Capillary: 115 mg/dL — ABNORMAL HIGH (ref 70–99)
Glucose-Capillary: 115 mg/dL — ABNORMAL HIGH (ref 70–99)
Glucose-Capillary: 119 mg/dL — ABNORMAL HIGH (ref 70–99)
Glucose-Capillary: 123 mg/dL — ABNORMAL HIGH (ref 70–99)
Glucose-Capillary: 154 mg/dL — ABNORMAL HIGH (ref 70–99)
Glucose-Capillary: 159 mg/dL — ABNORMAL HIGH (ref 70–99)

## 2023-12-31 LAB — COMPREHENSIVE METABOLIC PANEL WITH GFR
ALT: 31 U/L (ref 0–44)
AST: 48 U/L — ABNORMAL HIGH (ref 15–41)
Albumin: 2.7 g/dL — ABNORMAL LOW (ref 3.5–5.0)
Alkaline Phosphatase: 76 U/L (ref 38–126)
Anion gap: 11 (ref 5–15)
BUN: 22 mg/dL — ABNORMAL HIGH (ref 6–20)
CO2: 19 mmol/L — ABNORMAL LOW (ref 22–32)
Calcium: 8.8 mg/dL — ABNORMAL LOW (ref 8.9–10.3)
Chloride: 108 mmol/L (ref 98–111)
Creatinine, Ser: 0.72 mg/dL (ref 0.44–1.00)
GFR, Estimated: >60 mL/min (ref >60)
Glucose, Bld: 111 mg/dL — ABNORMAL HIGH (ref 70–99)
Potassium: 3.5 mmol/L (ref 3.5–5.1)
Sodium: 138 mmol/L (ref 135–145)
Total Bilirubin: 0.6 mg/dL (ref 0.0–1.2)
Total Protein: 5.4 g/dL — ABNORMAL LOW (ref 6.5–8.1)

## 2023-12-31 LAB — CBC
HCT: 25.8 % — ABNORMAL LOW (ref 36.0–46.0)
Hemoglobin: 8.2 g/dL — ABNORMAL LOW (ref 12.0–15.0)
MCH: 28.5 pg (ref 26.0–34.0)
MCHC: 31.8 g/dL (ref 30.0–36.0)
MCV: 89.6 fL (ref 80.0–100.0)
Platelets: 137 10*3/uL — ABNORMAL LOW (ref 150–400)
RBC: 2.88 MIL/uL — ABNORMAL LOW (ref 3.87–5.11)
RDW: 17.9 % — ABNORMAL HIGH (ref 11.5–15.5)
WBC: 7.8 10*3/uL (ref 4.0–10.5)
nRBC: 0 % (ref 0.0–0.2)

## 2023-12-31 MED ORDER — LACTATED RINGERS IV SOLN: INTRAVENOUS | Status: AC

## 2023-12-31 MED ORDER — PROCHLORPERAZINE EDISYLATE 10 MG/2ML IJ SOLN
10.0000 mg | INTRAMUSCULAR | Status: DC | PRN
  Administered 2023-12-31: 10 mg via INTRAVENOUS
  Filled 2023-12-31: qty 2

## 2023-12-31 MED ORDER — METOCLOPRAMIDE HCL 5 MG/ML IJ SOLN
5.0000 mg | Freq: Three times a day (TID) | INTRAMUSCULAR | Status: AC
  Administered 2023-12-31 – 2024-01-01 (×3): 5 mg via INTRAVENOUS
  Filled 2023-12-31 (×3): qty 2

## 2023-12-31 MED ORDER — NALOXONE HCL 0.4 MG/ML IJ SOLN: 0.4000 mg | INTRAMUSCULAR | Status: DC | PRN

## 2023-12-31 MED ORDER — METOCLOPRAMIDE HCL 5 MG/ML IJ SOLN: 5.0000 mg | Freq: Four times a day (QID) | INTRAMUSCULAR | Status: DC

## 2023-12-31 NOTE — Progress Notes (Signed)
 Speech Language Pathology  Patient Details Name: Meredith Park MRN: [id number] DOB: 02/22/1971 Today's Date: 12/31/2023 Time:  -     MBS was scheduled for 12:30 however pt was vomiting her tube feeds, was sleepy and not responding to SLP earlier this morning. SLP just checked on her and she opens eyes but falls back asleep and still not responding. Pt should be at her baselline which is typically alert and communicating (whispering) for best swallow outcome. SLP will cancel MBS for today and reattempt tomorrow.     [name]  12/31/2023, 11:36 AM

## 2023-12-31 NOTE — Progress Notes (Signed)
 Physical Therapy Treatment Patient Details Name: Meredith Park MRN: [id number] DOB: 1970/10/10 Today's Date: 12/31/2023   History of Present Illness 53 yo female admitted 11/27/23 s/p seizure with acute encephalopathy, acute respiratory failure, septic shock secondary to aspiration PNA. LP 3/26, ETT 3/26-4/2, re-intubated 4/2. Extubated 4/7. PMH: HTN, HLD ETOH abuse hx, seizure    PT Comments  Patient resting in bed, alert and nodding head at PT/OT introduction and agreeable to mobilize with therapy. Pt followed simple cues ~75% of time and initiated Rt knee flex and Rt UE reach to Lt bed rail for  Lt roll. Max+2 ultimately with use of bed pad pivot to EOB and max Assist to raise trunk upright with posterior support to maintain seated. Pt with slight Lt lean at EOB and Rt Forearm prop performed with max assist to position UE. Pt rested on Rt forearm for ~3 minutes and able to gaze up to midline with mod assist to turn head to midline for view of therapist hand. Pt able to identify # of fingers held up and mimic with Rt hand. Max simple cues provided to return to midline and pt able to press up through Rt UE with mod assist. Pt noted to have some dry heaving at EOB and HR assessed. Pt in 160's with Max jump to 178 bpm and fluctuating back to 150-170. Pt returned to sidelying in bed with Max+2 and repositioned in upright chair position, bed in reverse trendelenburg to lean more anteriorly to reduce risk of aspiration. RN called to room and notified. HR recovered to 100's following emesis. With additional rest time recovered to 80's at rest. Pt had additional bout of emesis and RN returned to room. EOS Alarm on and call bell within reach, Rt mitt re-applied, tube feed held, and bed remained upright. Pt nodding she still feels nauseous, but gave thumbs up she felt a little better. Will continue to progress pt as able. Patient will benefit from intensive inpatient follow-up therapy, >3 hours/day.    If  plan is discharge home, recommend the following: Two people to help with walking and/or transfers;Assistance with cooking/housework;Assist for transportation;Help with stairs or ramp for entrance;Supervision due to cognitive status   Can travel by private vehicle     No  Equipment Recommendations  Wheelchair cushion (measurements PT);Wheelchair (measurements PT);Hospital bed;Hoyer lift    Recommendations for Other Services Rehab consult     Precautions / Restrictions Precautions Precautions: Fall;Other (comment) Recall of Precautions/Restrictions: Impaired Precaution/Restrictions Comments: rectal pouch,PEG, Rt mitt Restrictions Weight Bearing Restrictions Per Provider Order: No     Mobility  Bed Mobility Overal bed mobility: Needs Assistance Bed Mobility: Rolling, Sidelying to Sit, Sit to Sidelying Rolling: Max assist, Used rails, +2 for physical assistance Sidelying to sit: Max assist, +2 for physical assistance, +2 for safety/equipment, Used rails     Sit to sidelying: Max assist, +2 for safety/equipment, Used rails, +2 for physical assistance General bed mobility comments: cues to sequence, pt able to flex Rt LE and reachg Rt UE to bed rail with assist to guide head turn and hand placement to view rail. Max Assist +2 with bed pad ultimately to sit up EOB. Pt tachycardic at EOB and Max+2 to return to sidelying due to dry heaves. Placed in chair position and tilited in reverse trendelenburg for bouts of emesis.    Transfers                   General transfer comment: limited by tachycardia  Ambulation/Gait               General Gait Details: unable   Stairs             Wheelchair Mobility     Tilt Bed    Modified Rankin (Stroke Patients Only) Modified Rankin (Stroke Patients Only) Pre-Morbid Rankin Score: No symptoms Modified Rankin: Severe disability     Balance                                             Communication Communication Communication: Impaired Factors Affecting Communication: Difficulty expressing self;Reduced clarity of speech  Cognition Arousal: Alert Behavior During Therapy: Flat affect   PT - Cognitive impairments: Difficult to assess Difficult to assess due to: Impaired communication                       Following commands: Impaired Following commands impaired: Follows one step commands inconsistently, Follows one step commands with increased time    Cueing Cueing Techniques: Verbal cues, Tactile cues, Visual cues  Exercises Other Exercises Other Exercises: dynamic reaching sitting EOB with RUE    General Comments        Pertinent Vitals/Pain Pain Assessment Pain Assessment: Faces Faces Pain Scale: Hurts even more Pain Location: shake head no for pain, shakes head yes for nausea Pain Descriptors / Indicators: Discomfort (nausea) Pain Intervention(s): Limited activity within patient's tolerance, Monitored during session, Repositioned, Other (comment) (RN notified)    Home Living                          Prior Function            PT Goals (current goals can now be found in the care plan section) Acute Rehab PT Goals Patient Stated Goal: unable to state PT Goal Formulation: With patient/family Time For Goal Achievement: 01/04/24 Potential to Achieve Goals: Fair Progress towards PT goals: Progressing toward goals    Frequency    Min 2X/week      PT Plan      Co-evaluation PT/OT/SLP Co-Evaluation/Treatment: Yes Reason for Co-Treatment: Complexity of the patient's impairments (multi-system involvement);For patient/therapist safety;To address functional/ADL transfers PT goals addressed during session: Mobility/safety with mobility;Balance OT goals addressed during session: Strengthening/ROM      AM-PAC PT "6 Clicks" Mobility   Outcome Measure  Help needed turning from your back to your side while in a flat bed without  using bedrails?: A Lot Help needed moving from lying on your back to sitting on the side of a flat bed without using bedrails?: Total Help needed moving to and from a bed to a chair (including a wheelchair)?: Total Help needed standing up from a chair using your arms (e.g., wheelchair or bedside chair)?: A Lot Help needed to walk in hospital room?: Total Help needed climbing 3-5 steps with a railing? : Total 6 Click Score: 8    End of Session Equipment Utilized During Treatment: Gait belt Activity Tolerance: Patient tolerated treatment well Patient left: in bed;with call bell/phone within reach;with bed alarm set;with SCD's reapplied Nurse Communication: Mobility status PT Visit Diagnosis: Muscle weakness (generalized) (M62.81);Difficulty in walking, not elsewhere classified (R26.2);Other symptoms and signs involving the nervous system (E95.284)     Time: 1324-4010 PT Time Calculation (min) (ACUTE ONLY): 39 min  Charges:    $  Therapeutic Activity: 23-37 mins PT General Charges $$ ACUTE PT VISIT: 1 Visit                     Tish Forge, DPT Acute Rehabilitation Services Office [phone]  12/31/23 9:31 AM

## 2023-12-31 NOTE — Progress Notes (Signed)
 [facility]                                                                              Meredith Park, is a 53 y.o. female, DOB - 11/20/1970, ZOX:[id number] Admit date - 11/27/2023    Outpatient Primary MD for the patient is Chancey Combe, [place]  E, PA-C  LOS - 34  days  Chief Complaint  Patient presents with   Seizures       Brief summary   53 year old female with history of alcohol abuse, HLD, seizures, hypertension, abstinence from alcohol for last 1 year, found down beside her bed by her family members, was noted to be shaking and unresponsive.  EMS was called and was found to be in generalized tonic-clonic seizures, treated with Versed  with cessation of seizures, remained minimally responsive in the ED.  Hypoxemic requiring 10 L O2 and was intubated to protect airway and admitted to the ICU. Significant events as below. 3/25 admit to ICU.  Hypoxemic and unresponsive.  Intubated 3/26 LP performed. Increasing pressor requirement 3/27 Increased vasopressor requirement with worsening MOD including AKI, shock liver. Echo with stress CM. 3/29 Improved vasopressor requirements 4/2 central line d/c, extubated (had cuff leak), reintubated for stridor & secretions several hours later 4/5 CTH w/progression of unihemispheric edema, thought more c/w seizure changes 4/6 LTM, steroids, 4/7 extubated, intermittently on predex for agitation, LTM d/c'd, tmax 101.6/ WBC stable 4/10 on room air.  Transferred to medical floor.  Core track feeding. 4/11 full of secretions unable to clear , requiring non- rebreather, transferred back to ICU 4/14: Transferred out of ICU, [facility] 4/17 patient working with SLP making progress. PEG placement requested.  4/18 MRI brain reviewed and neurology signed off. 4/21 IR unable to place the PEG due to colon / safe window. Gen surgery consulted. 4/22 surgical PEG placement today. Overnight patient had some blood mixed with stool in flexi seal positive for  occult blood. GI consulted for recommendations.  4/26: Abdominal pain, KUB: Ileus, tube feeds held   Assessment & Plan      Status epilepticus (HCC) in the setting of history of seizure disorder - Acute encephalopathy secondary to status epilepticus - Right cerebral hemisphere edema consistent with postictal state, still has left-sided weakness -Neurology following suspect this is postsurgical versus potential autoimmune/paraneoplastic/vasculitis. -CT of the chest abdomen and pelvis without any signs of malignancy -Neurology recommends to continue with Topamax  100 mg daily, lamotrigine  250 mg BID and DC Vimpat  and to continue with Prednisone  taper by 10 mg every week. -Currently she is on prednisone  40 mg starting from 4/22, taper by 10 mg weekly. - Repeat MRI brain showed recent punctate infarcts in the left temporal subcortical white matter and superior right cerebellum. Evolutionary changes throughout the right cerebral and left cerebellar hemispheres as described above most compatible with evolving injury from recent status epilepticus. -  on aspirin  81 mg daily and lipitor 40 mg daily for secondary stroke prevention.       Acute hypoxic respiratory failure secondary to aspiration versus volume overload -Patient was initially intubated for status epilepticus and airway protection, subsequently reintubated due to aspiration versus volume overload, has  been extubated - On room air, completed course of antibiotics.  - echocardiogram showed LVEF of 40 to 45%, left ventricle demonstrates regional wall abnormalities.      Dysphagia/ Nutrition -Patient was placed on core track, failed SLP evaluation -Palliative medicine consulted for goals of care/PEG tubes:  Continue with full scope treatments with aggressive life prolonging interventions including PEG.  -IR unable to place PEG due to no safe window.  - Surgery consulted and underwent EGD and PEG tube placement on 4/22, Tube feeds  started -Per GI today, no intervention is needed as endoscopy was completed by surgery on 4/22 and was unremarkable. - Ileus improved, tolerating tube feeds however had nausea and vomiting during physical therapy session today -Continue IV Reglan  5 mg every 8 hours, Compazine  as needed, gentle hydration with Ringer lactate     New onset cardiomyopathy, elevated troponin likely due to demand ischemia Hypertension - 2D echo 11/28/2023 showed EF of 40 to 45%, G1 DD, regional WMA -Seen by cardiology, recommending conservative management for now.  - Continue Coreg , cardiology recommended repeat echo in near future and if she has meaningful neurological recovery to reach out to cardiology for further work up of new onset cardiomyopathy.  - currently on coreg  and losartan , however borderline BP, will hold losartan .       Acute Kidney injury secondary to rhabdomyolysis, ATN, metabolic acidosis Creatinine is back to baseline,  - Continue sodium bicarb 650 mg 3 times daily  - f/u BMET, placed on gentle hydration with IV fluids  Agitation, acute encephalopathy - Continue Seroquel  50 mg at bedtime - Will hold a.m. Seroquel   Hypernatremia.  Resolved.      Anemia of acute illness/ guaiac positive stools:  Per GI, no intervention needed as endoscopy was completed by surgery on 4/22 and was unremarkable Continue PPI  - Follow CBC   Acute transaminitis Follow LFTs     Hyperglycemia probably secondary to steroids and tube feeds CBG (last 3)  Recent Labs    12/31/23 0009 12/31/23 0351 12/31/23 0745  GLUCAP 112* 110* 115*   Continue sliding scale insulin      Leukocytosis  -Likely from steroids   Hypotension on 4/21  From iv fentanyl  and versad Ordered fluid bolus with RL,  BP parameters improved.   Severe protein calorie malnutrition Nutrition Problem: Severe Malnutrition Etiology: social / environmental circumstances Signs/Symptoms: severe muscle depletion, percent weight  loss  Interventions: Prostat, Tube feeding, MVI  Estimated body mass index is 25.32 kg/m as calculated from the following:   Height as of this encounter: 5\' 2"  (1.575 m).   Weight as of this encounter: 62.8 kg.  Code Status: Full code DVT Prophylaxis:  heparin  injection 5,000 Units Start: 12/04/23 0930 SCDs Start: 11/27/23 2310   Level of Care: Level of care: Progressive Family Communication: Updated patient Disposition Plan:      Remains inpatient appropriate:   DC to SNF once off the restraints   Procedures:  PEG tube placement on 4/22 by surgery  Consultants:   General Surgery Neurology IR  Antimicrobials:   Anti-infectives (From admission, onward)    Start     Dose/Rate Route Frequency Ordered Stop   12/24/23 0910  ceFAZolin  (ANCEF ) IVPB 2g/100 mL premix        over 30 Minutes Intravenous Continuous PRN 12/24/23 0911 12/24/23 0910   12/14/23 1245  Ampicillin -Sulbactam (UNASYN ) 3 g in sodium chloride  0.9 % 100 mL IVPB        3 g 200 mL/hr over  30 Minutes Intravenous Every 6 hours 12/14/23 1154 12/19/23 0640   12/04/23 0930  piperacillin -tazobactam (ZOSYN ) IVPB 3.375 g  Status:  Discontinued        3.375 g 12.5 mL/hr over 240 Minutes Intravenous Every 8 hours 12/04/23 0839 12/10/23 1048   11/30/23 1000  cefTRIAXone  (ROCEPHIN ) 2 g in sodium chloride  0.9 % 100 mL IVPB  Status:  Discontinued        2 g 200 mL/hr over 30 Minutes Intravenous Every 24 hours 11/29/23 1006 12/04/23 0830   11/29/23 1000  vancomycin  (VANCOREADY) IVPB 750 mg/150 mL  Status:  Discontinued        750 mg 150 mL/hr over 60 Minutes Intravenous Every 24 hours 11/28/23 0245 11/29/23 1006   11/28/23 2200  acyclovir  (ZOVIRAX ) 630 mg in dextrose  5 % 100 mL IVPB  Status:  Discontinued        10 mg/kg  63 kg 112.6 mL/hr over 60 Minutes Intravenous Every 24 hours 11/28/23 0247 11/29/23 0836   11/28/23 1000  ampicillin  (OMNIPEN) 2 g in sodium chloride  0.9 % 100 mL IVPB  Status:  Discontinued        2  g 300 mL/hr over 20 Minutes Intravenous Every 8 hours 11/28/23 0249 11/29/23 1006   11/28/23 0115  vancomycin  (VANCOREADY) IVPB 2000 mg/400 mL        2,000 mg 200 mL/hr over 120 Minutes Intravenous  Once 11/28/23 0028 11/28/23 0308   11/28/23 0115  cefTRIAXone  (ROCEPHIN ) 2 g in sodium chloride  0.9 % 100 mL IVPB  Status:  Discontinued        2 g 200 mL/hr over 30 Minutes Intravenous Every 12 hours 11/28/23 0028 11/29/23 1006   11/28/23 0115  ampicillin  (OMNIPEN) 2 g in sodium chloride  0.9 % 100 mL IVPB        2 g 300 mL/hr over 20 Minutes Intravenous  Once 11/28/23 0028 11/28/23 0129   11/28/23 0100  acyclovir  (ZOVIRAX ) 700 mg in dextrose  5 % 100 mL IVPB        700 mg 114 mL/hr over 60 Minutes Intravenous  Once 11/28/23 0029 11/28/23 0301          Medications  aspirin   81 mg Per Tube Daily   atorvastatin   40 mg Per Tube Daily   carvedilol   3.125 mg Per Tube BID WC   Chlorhexidine  Gluconate Cloth  6 each Topical Daily   feeding supplement (PROSource TF20)  60 mL Per Tube Daily   fiber supplement (BANATROL TF)  60 mL Per Tube BID   folic acid   1 mg Per Tube Daily   free water   200 mL Per Tube Q4H   heparin  injection (subcutaneous)  5,000 Units Subcutaneous Q8H   insulin  aspart  0-9 Units Subcutaneous Q4H   lamoTRIgine   250 mg Per Tube BID   losartan   25 mg Per Tube Daily   metoCLOPramide  (REGLAN ) injection  5 mg Intravenous Q8H   multivitamin with minerals  1 tablet Per Tube Daily   nystatin  cream   Topical BID   mouth rinse  15 mL Mouth Rinse 4 times per day   pantoprazole  (PROTONIX ) IV  40 mg Intravenous Q24H   [START ON 01/01/2024] predniSONE   30 mg Per Tube Q breakfast   Followed by   Cecily Cohen ON 01/08/2024] predniSONE   20 mg Per Tube Q breakfast   Followed by   Cecily Cohen ON 01/15/2024] predniSONE   10 mg Per Tube Q breakfast   QUEtiapine   50 mg Per Tube  QHS   sertraline   25 mg Per Tube QHS   sodium bicarbonate   650 mg Per Tube TID   thiamine   100 mg Per Tube Daily   topiramate    100 mg Per Tube QHS      Subjective:   Meredith Park was seen and examined today.  Seen this morning, alert and awake, no acute issues however appears to have vasovagal episode with PT session, borderline BP with tachycardia, nausea and vomiting.  Was transiently lethargic after the PT session.  Tolerating tube feeds.    Objective:   Vitals:   12/31/23 0349 12/31/23 0454 12/31/23 0744 12/31/23 0936  BP: (!) 122/93  126/87 98/72  Pulse: 82  82 79  Resp: 18  18 18   Temp: 98.3 F (36.8 C)  98.6 F (37 C) 98.5 F (36.9 C)  TempSrc: Axillary  Oral Oral  SpO2: 100%  100% 100%  Weight:  62.8 kg    Height:        Intake/Output Summary (Last 24 hours) at 12/31/2023 1025 Last data filed at 12/31/2023 2725 Gross per 24 hour  Intake 480.66 ml  Output 1500 ml  Net -1019.34 ml     Wt Readings from Last 3 Encounters:  12/31/23 62.8 kg  11/23/22 105.9 kg  11/22/22 105.9 kg    Physical Exam General: Alert and awake, speaking in whispering voice Cardiovascular: S1 S2 clear, RRR.  Respiratory: CTAB, no wheezing Gastrointestinal: Soft, nontender, nondistended, NBS Ext: no pedal edema bilaterally Neuro: left hemiplegia Psych: flat affect, speaking softly   Data Reviewed:  I have personally reviewed following labs    CBC Lab Results  Component Value Date   WBC 14.1 (H) 12/29/2023   RBC 2.90 (L) 12/29/2023   HGB 8.6 (L) 12/29/2023   HCT 26.6 (L) 12/29/2023   MCV 91.7 12/29/2023   MCH 29.7 12/29/2023   PLT 126 (L) 12/29/2023   MCHC 32.3 12/29/2023   RDW 18.9 (H) 12/29/2023   LYMPHSABS 1.5 12/26/2023   MONOABS 0.4 12/26/2023   EOSABS 0.1 12/26/2023   BASOSABS 0.0 12/26/2023     Last metabolic panel Lab Results  Component Value Date   NA 138 12/30/2023   K 4.0 12/30/2023   CL 110 12/30/2023   CO2 16 (L) 12/30/2023   BUN 22 (H) 12/30/2023   CREATININE 0.68 12/30/2023   GLUCOSE 81 12/30/2023   GFRNONAA >60 12/30/2023   GFRAA >60 11/10/2018   CALCIUM  9.2  12/30/2023   PHOS 4.6 12/30/2023   PROT 4.8 (L) 12/26/2023   ALBUMIN  2.8 (L) 12/30/2023   BILITOT 0.5 12/26/2023   ALKPHOS 55 12/26/2023   AST 70 (H) 12/26/2023   ALT 30 12/26/2023   ANIONGAP 12 12/30/2023    CBG (last 3)  Recent Labs    12/31/23 0009 12/31/23 0351 12/31/23 0745  GLUCAP 112* 110* 115*      Coagulation Profile: No results for input(s): "INR", "PROTIME" in the last 168 hours.   Radiology Studies: I have personally reviewed the imaging studies  No results found.       Bertram Brocks M.D. [facility] 12/31/2023, 10:25 AM  Available via Epic secure chat 7am-7pm After 7 pm, please refer to night coverage provider listed on amion.

## 2023-12-31 NOTE — Progress Notes (Signed)
 Nutrition Follow-up  DOCUMENTATION CODES:  Severe malnutrition in context of social or environmental circumstances  INTERVENTION:  Continue tube feeding via PEG in place: Jevity 1.5 at 50 ml/h (1200 ml per day) Prosource TF20 60 ml 1x/d Adjust FWF to 200mL q4h Provides 1880 kcal, 97 gm protein, 914 ml free water  daily (TF+flush = free water  daily) Continue MVI daily  When ready to transition to bolus feeds, recommend the following: 5 cartons of Jevity 1.5 (or equivalent) daily Initiate at 1/2 a carton x 2 feeds and increase by 1/2 of a carton every 2 feeds if tolerated until goal of 1 cartons 5x/d is achieved ( ) Flush with 50mL of free water  before and after each feed ( total 5x/d) plus an additional 200mL flush 3x/d This regimen will provide 2130kcal, 91g of protein, free water  ( of free water  = TF+flush)  NUTRITION DIAGNOSIS:  Severe Malnutrition related to social / environmental circumstances as evidenced by severe muscle depletion, percent weight loss. - Ongoing.   GOAL:  Patient will meet greater than or equal to 90% of their needs - Met with enteral feeds  MONITOR:  TF tolerance, Diet advancement, I & O's, Labs, Weight trends  REASON FOR ASSESSMENT:  Ventilator    ASSESSMENT:  Pt with hx of alcohol abuse, drug abuse, HTN, and HLD presented to ED after being found down at home with seizure-like activity.  3/25 - admitted; intubated 3/27 - increased pressor requirement worsening MOD 3/28 - s/p cortrak placement (gastric) 3/29 - improved pressor requirements 4/2 - extubated; re-intubated due to stridor and secretions 4/7 - Extubated, NPO  4/13 - cortrak clogged, removed and replaced with NGT, transferred to 3W 4/16 - pt removed NGT, cortrak placed 4/21 - PEG attempted by IR, no safe window and procedure aborted, cortrak removed by IR, replaced after arrival back to unit 4/22 - PEG placement in OR by surgery team planned for today  Pt  resting in bed at the time of assessment. Awake and alert, but does not answer questions today. Noted TF currently unhooked and paused. Per PT note, vomited during therapy session today. RN reports that she paused the feeds but then restarted and pt seems to be tolerating ok now. Will discontinue extra fiber in banatrol in the event it is causing nausea.   SLP hopeful for MBS today but pt's decreased mental status this AM was a barrier, rescheduled for tomorrow. Noted that SNF will have bed for pt tomorrow.   Admit weight: 63 kg  Current weight: 62.8 kg  39% weight loss x 12 months (11/23/22-admission 11/28/23), confirmed to be accurate with mother of pt  Nutritionally Relevant Medications: Scheduled Meds:  atorvastatin   40 mg Per Tube Daily   feeding supplement (PROSource TF20)  60 mL Per Tube Daily   folic acid   1 mg Per Tube Daily   free water   200 mL Per Tube Q4H   insulin  aspart  0-9 Units Subcutaneous Q4H   metoCLOPramide  (REGLAN ) injection  5 mg Intravenous Q8H   multivitamin with minerals  1 tablet Per Tube Daily   pantoprazole  (PROTONIX ) IV  40 mg Intravenous Q24H   [START ON 01/01/2024] predniSONE   30 mg Per Tube Q breakfast   sodium bicarbonate   650 mg Per Tube TID   thiamine   100 mg Per Tube Daily   Continuous Infusions:  feeding supplement (JEVITY 1.5 CAL/FIBER) 50 mL/hr at 12/31/23 0358   lactated ringers  75 mL/hr at 12/31/23 1030   PRN Meds: docusate, polyethylene  glycol, prochlorperazine   Labs Reviewed: BUN 22 CBG ranges from 74-154 mg/dL over the last 24 hours HgbA1c 5% (3/27)  NUTRITION - FOCUSED PHYSICAL EXAM: Flowsheet Row Most Recent Value  Orbital Region Mild depletion  Upper Arm Region Mild depletion  Thoracic and Lumbar Region No depletion  Buccal Region Moderate depletion  Temple Region Mild depletion  Clavicle Bone Region Mild depletion  Clavicle and Acromion Bone Region Moderate depletion  Scapular Bone Region Moderate depletion  Dorsal Hand Severe  depletion  Patellar Region Severe depletion  Anterior Thigh Region Severe depletion  Posterior Calf Region Moderate depletion  Edema (RD Assessment) Mild  [hand]  Hair Reviewed  Eyes Unable to assess  Mouth Unable to assess  Skin Reviewed  Nails Reviewed   Diet Order:   Diet Order             Diet NPO time specified  Diet effective now                   EDUCATION NEEDS:  Not appropriate for education at this time  Skin:  Skin Assessment: Reviewed RN Assessment  Last BM:  4/28 FMS placed 4/5, no output x 24 hours charted in flowsheet  Height:  Ht Readings from Last 1 Encounters:  12/25/23 5\' 2"  (1.575 m)    Weight:  Wt Readings from Last 1 Encounters:  12/31/23 62.8 kg    Ideal Body Weight:  50 kg  BMI:  Body mass index is 25.32 kg/m.  Estimated Nutritional Needs:  Kcal:  1700-1900 kcal/d Protein:  90-105g/d Fluid:  1.8L/d    Edwena Graham, RD, LDN Registered Dietitian II Please reach out via secure chat

## 2023-12-31 NOTE — TOC Progression Note (Signed)
 Transition of Care [facility]) - Progression Note    Patient Details  Name: Meredith Park MRN: [id number] Date of Birth: 07/22/1971  [facility] CM/SW Contact  Elspeth Hals, LCSW Phone Number: 12/31/2023, 11:45 AM  Clinical Narrative:   CSW spoke with [facility]: no SNF bed available until tomorrow.  Team updated.     Expected Discharge Plan: Skilled Nursing Facility Barriers to Discharge: Continued Medical Work up, English as a second language teacher, SNF Pending bed offer  Expected Discharge Plan and Services In-house Referral: Clinical Social Work   Post Acute Care Choice: Skilled Nursing Facility Living arrangements for the past 2 months: Single Family Home                                       Social Determinants of Health (SDOH) Interventions SDOH Screenings   Food Insecurity: Low Risk  (07/18/2023)   Received from [facility]  Housing: Low Risk  (07/18/2023)   Received from [facility]  Transportation Needs: No Transportation Needs (07/18/2023)   Received from [facility]  Utilities: Low Risk  (07/18/2023)   Received from [facility]  Tobacco Use: Low Risk  (12/25/2023)  Recent Concern: Tobacco Use - Medium Risk (11/05/2023)   Received from [facility]    Readmission Risk Interventions    12/12/2023    3:50 PM  Readmission Risk Prevention Plan  Transportation Screening Complete  Medication Review (RN Care Manager) Complete  PCP or Specialist appointment within 3-5 days of discharge Complete  HRI or Home Care Consult Complete  SW Recovery Care/Counseling Consult Complete  Palliative Care Screening Not Applicable  Skilled Nursing Facility Complete

## 2023-12-31 NOTE — Progress Notes (Signed)
 Occupational Therapy Treatment Patient Details Name: Meredith Park MRN: [id number] DOB: July 23, 1971 Today's Date: 12/31/2023   History of present illness 53 yo female admitted 11/27/23 s/p seizure with acute encephalopathy, acute respiratory failure, septic shock secondary to aspiration PNA. LP 3/26, ETT 3/26-4/2, re-intubated 4/2. Extubated 4/7. PMH: HTN, HLD ETOH abuse hx, seizure   OT comments  Patient seen in conjunction with PT in order to address functional deficits. Patient more flat in session, and barely audible when communicating. Left neglect remains prominent with max to total cues in order to attempt to track. PT and OT using mirror to promote finding midline (max A of 2 to transition to EOB), with utilizing NDT techniques (resting on R forearm) and engaging in visual scanning tasks. Patient with noted increased HR and dry heaving sitting EOB. Pt in 160's with Max jump to 178 bpm and fluctuating back to 150-170. Pt returned to sidelying in bed with Max+2 and repositioned in upright chair position, bed in reverse trendelenburg to lean more anteriorly to reduce risk of aspiration. RN called to room and notified. HR recovered to 100's following emesis. With additional rest time recovered to 80's at rest. Pt had additional bout of emesis and RN returned to room. OT recommendation remains appropriate, with goals updated to reflect progress. OT will continue to follow.       If plan is discharge home, recommend the following:  Two people to help with walking and/or transfers;Assistance with cooking/housework;Assist for transportation;Supervision due to cognitive status;Direct supervision/assist for medications management;Two people to help with bathing/dressing/bathroom;Direct supervision/assist for financial management   Equipment Recommendations  Wheelchair (measurements OT);Wheelchair cushion (measurements OT);Hospital bed;Hoyer lift;BSC/3in1    Recommendations for Other Services       Precautions / Restrictions Precautions Precautions: Fall;Other (comment) Recall of Precautions/Restrictions: Impaired Precaution/Restrictions Comments: rectal pouch,PEG, Rt mitt Restrictions Weight Bearing Restrictions Per Provider Order: No       Mobility Bed Mobility Overal bed mobility: Needs Assistance Bed Mobility: Rolling, Sidelying to Sit, Sit to Sidelying Rolling: Max assist, Used rails, +2 for physical assistance Sidelying to sit: Max assist, +2 for physical assistance, +2 for safety/equipment, Used rails     Sit to sidelying: Max assist, +2 for safety/equipment, Used rails, +2 for physical assistance General bed mobility comments: cues to sequence, pt able to flex Rt LE and reachg Rt UE to bed rail with assist to guide head turn and hand placement to view rail. Max Assist +2 with bed pad ultimately to sit up EOB. Pt tachycardic at EOB and Max+2 to return to sidelying due to dry heaves. Placed in chair position and tilited in reverse trendelenburg for bouts of emesis.    Transfers                   General transfer comment: limited by tachycardia     Balance                                           ADL either performed or assessed with clinical judgement   ADL Overall ADL's : Needs assistance/impaired Eating/Feeding: NPO Eating/Feeding Details (indicate cue type and reason): PEG tube                                 Functional mobility during ADLs: Maximal assistance;+2 for safety/equipment;+2 for physical  assistance      Extremity/Trunk Assessment              Vision       Perception     Praxis     Communication Communication Communication: Impaired Factors Affecting Communication: Difficulty expressing self;Reduced clarity of speech   Cognition Arousal: Alert Behavior During Therapy: Flat affect               OT - Cognition Comments: more flat, but also nauseous, difficult to discern                  Following commands: Impaired Following commands impaired: Follows one step commands inconsistently, Follows one step commands with increased time      Cueing   Cueing Techniques: Verbal cues, Tactile cues, Visual cues  Exercises Exercises: Other exercises Other Exercises Other Exercises: dynamic reaching sitting EOB with RUE    Shoulder Instructions       General Comments      Pertinent Vitals/ Pain       Pain Assessment Pain Assessment: Faces Faces Pain Scale: Hurts even more Pain Location: shake head no for pain, shakes head yes for nausea Pain Descriptors / Indicators: Discomfort (nausea) Pain Intervention(s): Limited activity within patient's tolerance, Monitored during session, Other (comment) (RN notified)  Home Living                                          Prior Functioning/Environment              Frequency  Min 2X/week        Progress Toward Goals  OT Goals(current goals can now be found in the care plan section)     Acute Rehab OT Goals Patient Stated Goal: did not state OT Goal Formulation: With patient Time For Goal Achievement: 01/14/24 Potential to Achieve Goals: Fair ADL Goals Pt Will Perform Grooming: with set-up;sitting Additional ADL Goal #2: Patient will consistently find objects on the L with less than moderate cues in order to promote increased awareness.  Plan      Co-evaluation      Reason for Co-Treatment: Complexity of the patient's impairments (multi-system involvement);For patient/therapist safety;To address functional/ADL transfers PT goals addressed during session: Mobility/safety with mobility;Balance OT goals addressed during session: Strengthening/ROM      AM-PAC OT "6 Clicks" Daily Activity     Outcome Measure   Help from another person eating meals?: Total Help from another person taking care of personal grooming?: A Lot Help from another person toileting, which includes using  toliet, bedpan, or urinal?: Total Help from another person bathing (including washing, rinsing, drying)?: A Lot Help from another person to put on and taking off regular upper body clothing?: A Lot Help from another person to put on and taking off regular lower body clothing?: Total 6 Click Score: 9    End of Session Equipment Utilized During Treatment:  Statistician)  OT Visit Diagnosis: Unsteadiness on feet (R26.81);Muscle weakness (generalized) (M62.81);Other symptoms and signs involving cognitive function;Other symptoms and signs involving the nervous system (R29.898);Other abnormalities of gait and mobility (R26.89);Hemiplegia and hemiparesis Hemiplegia - Right/Left: Left Hemiplegia - dominant/non-dominant: Non-Dominant Hemiplegia - caused by: Unspecified   Activity Tolerance Treatment limited secondary to medical complications (Comment) (nausea and vomiting)   Patient Left in bed;with call bell/phone within reach;with bed alarm set;with restraints reapplied   Nurse Communication Mobility  status (N&V)        Time: 1610-9604 OT Time Calculation (min): 39 min  Charges: OT General Charges $OT Visit: 1 Visit OT Treatments $Self Care/Home Management : 8-22 mins  Mollie Anger E. Rochel Privett, OTR/L Acute Rehabilitation Services [phone]   Vincent Greek 12/31/2023, 1:41 PM

## 2024-01-01 ENCOUNTER — Inpatient Hospital Stay (HOSPITAL_COMMUNITY)

## 2024-01-01 DIAGNOSIS — G40901 Epilepsy, unspecified, not intractable, with status epilepticus: Secondary | ICD-10-CM | POA: Diagnosis not present

## 2024-01-01 DIAGNOSIS — R4182 Altered mental status, unspecified: Secondary | ICD-10-CM | POA: Diagnosis not present

## 2024-01-01 DIAGNOSIS — N179 Acute kidney failure, unspecified: Secondary | ICD-10-CM | POA: Diagnosis not present

## 2024-01-01 LAB — GLUCOSE, CAPILLARY
Glucose-Capillary: 104 mg/dL — ABNORMAL HIGH (ref 70–99)
Glucose-Capillary: 108 mg/dL — ABNORMAL HIGH (ref 70–99)
Glucose-Capillary: 112 mg/dL — ABNORMAL HIGH (ref 70–99)
Glucose-Capillary: 118 mg/dL — ABNORMAL HIGH (ref 70–99)
Glucose-Capillary: 128 mg/dL — ABNORMAL HIGH (ref 70–99)
Glucose-Capillary: 143 mg/dL — ABNORMAL HIGH (ref 70–99)
Glucose-Capillary: 179 mg/dL — ABNORMAL HIGH (ref 70–99)

## 2024-01-01 MED ORDER — CARVEDILOL 6.25 MG PO TABS
6.2500 mg | ORAL_TABLET | Freq: Two times a day (BID) | ORAL | Status: DC
  Administered 2024-01-01: 6.25 mg
  Filled 2024-01-01 (×2): qty 1

## 2024-01-01 NOTE — Progress Notes (Signed)
 [facility]                                                                              Meredith Park, is a 53 y.o. female, DOB - 02-10-1971, GLO:[id number] Admit date - 11/27/2023    Outpatient Primary MD for the patient is Fulbright, [place]  E, PA-C  LOS - 35  days  Chief Complaint  Patient presents with   Seizures       Brief summary   53 year old female with history of alcohol abuse, HLD, seizures, hypertension, abstinence from alcohol for last 1 year, found down beside her bed by her family members, was noted to be shaking and unresponsive.  EMS was called and was found to be in generalized tonic-clonic seizures, treated with Versed  with cessation of seizures, remained minimally responsive in the ED.  Hypoxemic requiring 10 L O2 and was intubated to protect airway and admitted to the ICU. Significant events as below. 3/25 admit to ICU.  Hypoxemic and unresponsive.  Intubated 3/26 LP performed. Increasing pressor requirement 3/27 Increased vasopressor requirement with worsening MOD including AKI, shock liver. Echo with stress CM. 3/29 Improved vasopressor requirements 4/2 central line d/c, extubated (had cuff leak), reintubated for stridor & secretions several hours later 4/5 CTH w/progression of unihemispheric edema, thought more c/w seizure changes 4/6 LTM, steroids, 4/7 extubated, intermittently on predex for agitation, LTM d/c'd, tmax 101.6/ WBC stable 4/10 on room air.  Transferred to medical floor.  Core track feeding. 4/11 full of secretions unable to clear , requiring non- rebreather, transferred back to ICU 4/14: Transferred out of ICU, [facility] 4/17 patient working with SLP making progress. PEG placement requested.  4/18 MRI brain reviewed and neurology signed off. 4/21 IR unable to place the PEG due to colon / safe window. Gen surgery consulted. 4/22 surgical PEG placement today. Overnight patient had some blood mixed with stool in flexi seal positive for  occult blood. GI consulted for recommendations.  4/26: Abdominal pain, KUB: Ileus, tube feeds held   Assessment & Plan      Status epilepticus (HCC) in the setting of history of seizure disorder - Acute encephalopathy secondary to status epilepticus - Right cerebral hemisphere edema consistent with postictal state, still has left-sided weakness -Neurology following suspect this is postsurgical versus potential autoimmune/paraneoplastic/vasculitis. -CT of the chest abdomen and pelvis without any signs of malignancy -Neurology recommends to continue with Topamax  100 mg daily, lamotrigine  250 mg BID and DC Vimpat  and to continue with Prednisone  taper by 10 mg every week. -Currently she is on prednisone  40 mg starting from 4/22, taper by 10 mg weekly. - Repeat MRI brain showed recent punctate infarcts in the left temporal subcortical white matter and superior right cerebellum. Evolutionary changes throughout the right cerebral and left cerebellar hemispheres as described above most compatible with evolving injury from recent status epilepticus. -  on aspirin  81 mg daily and lipitor 40 mg daily for secondary stroke prevention.  - Awaiting SNF     Acute hypoxic respiratory failure secondary to aspiration versus volume overload -Patient was initially intubated for status epilepticus and airway protection, subsequently reintubated due to aspiration versus volume  overload, has been extubated - On room air, completed course of antibiotics.  - echocardiogram showed LVEF of 40 to 45%, left ventricle demonstrates regional wall abnormalities.      Dysphagia/ Nutrition -Patient was placed on core track, failed SLP evaluation -Palliative medicine consulted for goals of care/PEG tubes:  Continue with full scope treatments with aggressive life prolonging interventions including PEG.  -IR unable to place PEG due to no safe window.  - Surgery consulted and underwent EGD and PEG tube placement on 4/22, Tube  feeds started -Per GI today, no intervention is needed as endoscopy was completed by surgery on 4/22 and was unremarkable. -Tolerating tube feeds, ileus has improved.  IV Reglan , fluids for 24 hours, Compazine  as needed     New onset cardiomyopathy, elevated troponin likely due to demand ischemia Hypertension - 2D echo 11/28/2023 showed EF of 40 to 45%, G1 DD, regional WMA -Seen by cardiology, recommending conservative management for now.  - Continue Coreg , cardiology recommended repeat echo in near future and if she has meaningful neurological recovery to reach out to cardiology for further work up of new onset cardiomyopathy.  - BP elevated with tachycardia, resume Coreg , increased to 6.25 mg twice daily -Losartan  currently on hold    Acute Kidney injury secondary to rhabdomyolysis, ATN, metabolic acidosis Creatinine is back to baseline,  - Continue sodium bicarb 650 mg 3 times daily  - Improving  Agitation, acute encephalopathy - Continue Seroquel  50 mg at bedtime  Hypernatremia.  Resolved.      Anemia of acute illness/ guaiac positive stools:  Per GI, no intervention needed as endoscopy was completed by surgery on 4/22 and was unremarkable Continue PPI  - H&H stable   Acute transaminitis Follow LFTs     Hyperglycemia probably secondary to steroids and tube feeds CBG (last 3)  Recent Labs    01/01/24 0004 01/01/24 0416 01/01/24 0749  GLUCAP 104* 118* 112*   Continue sliding scale insulin      Leukocytosis  -Likely from steroids, resolved   Hypotension on 4/21  From iv fentanyl  and versad Ordered fluid bolus with RL,  BP parameters improved.   Severe protein calorie malnutrition Nutrition Problem: Severe Malnutrition Etiology: social / environmental circumstances Signs/Symptoms: severe muscle depletion, percent weight loss  Interventions: Prostat, Tube feeding, MVI  Estimated body mass index is 24.48 kg/m as calculated from the following:   Height as of  this encounter: 5\' 2"  (1.575 m).   Weight as of this encounter: 60.7 kg.  Code Status: Full code DVT Prophylaxis:  heparin  injection 5,000 Units Start: 12/04/23 0930 SCDs Start: 11/27/23 2310   Level of Care: Level of care: Progressive Family Communication: Updated patient Disposition Plan:      Remains inpatient appropriate: Awaiting SNF   Procedures:  PEG tube placement on 4/22 by surgery  Consultants:   General Surgery Neurology IR  Antimicrobials:   Anti-infectives (From admission, onward)    Start     Dose/Rate Route Frequency Ordered Stop   12/24/23 0910  ceFAZolin  (ANCEF ) IVPB 2g/100 mL premix        over 30 Minutes Intravenous Continuous PRN 12/24/23 0911 12/24/23 0910   12/14/23 1245  Ampicillin -Sulbactam (UNASYN ) 3 g in sodium chloride  0.9 % 100 mL IVPB        3 g 200 mL/hr over 30 Minutes Intravenous Every 6 hours 12/14/23 1154 12/19/23 0640   12/04/23 0930  piperacillin -tazobactam (ZOSYN ) IVPB 3.375 g  Status:  Discontinued  3.375 g 12.5 mL/hr over 240 Minutes Intravenous Every 8 hours 12/04/23 0839 12/10/23 1048   11/30/23 1000  cefTRIAXone  (ROCEPHIN ) 2 g in sodium chloride  0.9 % 100 mL IVPB  Status:  Discontinued        2 g 200 mL/hr over 30 Minutes Intravenous Every 24 hours 11/29/23 1006 12/04/23 0830   11/29/23 1000  vancomycin  (VANCOREADY) IVPB 750 mg/150 mL  Status:  Discontinued        750 mg 150 mL/hr over 60 Minutes Intravenous Every 24 hours 11/28/23 0245 11/29/23 1006   11/28/23 2200  acyclovir  (ZOVIRAX ) 630 mg in dextrose  5 % 100 mL IVPB  Status:  Discontinued        10 mg/kg  63 kg 112.6 mL/hr over 60 Minutes Intravenous Every 24 hours 11/28/23 0247 11/29/23 0836   11/28/23 1000  ampicillin  (OMNIPEN) 2 g in sodium chloride  0.9 % 100 mL IVPB  Status:  Discontinued        2 g 300 mL/hr over 20 Minutes Intravenous Every 8 hours 11/28/23 0249 11/29/23 1006   11/28/23 0115  vancomycin  (VANCOREADY) IVPB 2000 mg/400 mL        2,000 mg 200  mL/hr over 120 Minutes Intravenous  Once 11/28/23 0028 11/28/23 0308   11/28/23 0115  cefTRIAXone  (ROCEPHIN ) 2 g in sodium chloride  0.9 % 100 mL IVPB  Status:  Discontinued        2 g 200 mL/hr over 30 Minutes Intravenous Every 12 hours 11/28/23 0028 11/29/23 1006   11/28/23 0115  ampicillin  (OMNIPEN) 2 g in sodium chloride  0.9 % 100 mL IVPB        2 g 300 mL/hr over 20 Minutes Intravenous  Once 11/28/23 0028 11/28/23 0129   11/28/23 0100  acyclovir  (ZOVIRAX ) 700 mg in dextrose  5 % 100 mL IVPB        700 mg 114 mL/hr over 60 Minutes Intravenous  Once 11/28/23 0029 11/28/23 0301          Medications  aspirin   81 mg Per Tube Daily   atorvastatin   40 mg Per Tube Daily   carvedilol   3.125 mg Per Tube BID WC   Chlorhexidine  Gluconate Cloth  6 each Topical Daily   feeding supplement (PROSource TF20)  60 mL Per Tube Daily   free water   200 mL Per Tube Q4H   heparin  injection (subcutaneous)  5,000 Units Subcutaneous Q8H   insulin  aspart  0-9 Units Subcutaneous Q4H   lamoTRIgine   250 mg Per Tube BID   multivitamin with minerals  1 tablet Per Tube Daily   nystatin  cream   Topical BID   mouth rinse  15 mL Mouth Rinse 4 times per day   pantoprazole  (PROTONIX ) IV  40 mg Intravenous Q24H   predniSONE   30 mg Per Tube Q breakfast   Followed by   Cecily Cohen ON 01/08/2024] predniSONE   20 mg Per Tube Q breakfast   Followed by   Cecily Cohen ON 01/15/2024] predniSONE   10 mg Per Tube Q breakfast   QUEtiapine   50 mg Per Tube QHS   sertraline   25 mg Per Tube QHS   sodium bicarbonate   650 mg Per Tube TID   topiramate   100 mg Per Tube QHS      Subjective:   Malayah Dusch was seen and examined today.  No acute issues today.  Tolerating tube feeds.    Objective:   Vitals:   12/31/23 1940 01/01/24 0044 01/01/24 0500 01/01/24 0753  BP: (!) 157/97 Aaron Aas)  120/96 (!) 136/91 (!) 134/96  Pulse: (!) 106 (!) 107 95 99  Resp: 18 18 18 18   Temp: 98.3 F (36.8 C) 98.1 F (36.7 C) 98.5 F (36.9 C) 98.4 F (36.9  C)  TempSrc: Axillary Axillary Axillary Oral  SpO2: 100% 100% 97% 98%  Weight:   60.7 kg   Height:        Intake/Output Summary (Last 24 hours) at 01/01/2024 1125 Last data filed at 01/01/2024 0920 Gross per 24 hour  Intake 2801.6 ml  Output 700 ml  Net 2101.6 ml     Wt Readings from Last 3 Encounters:  01/01/24 60.7 kg  11/23/22 105.9 kg  11/22/22 105.9 kg    Physical Exam General: Alert, awake, NAD, speaking softly, follows commands Cardiovascular: S1 S2 clear, RRR.  Respiratory: CTAB Gastrointestinal: Soft, nontender, nondistended, NBS Ext: no pedal edema bilaterally, heel protector Neuro: left hemiplegia Skin: No rashes Psych: Normal affect    Data Reviewed:  I have personally reviewed following labs    CBC Lab Results  Component Value Date   WBC 7.8 12/31/2023   RBC 2.88 (L) 12/31/2023   HGB 8.2 (L) 12/31/2023   HCT 25.8 (L) 12/31/2023   MCV 89.6 12/31/2023   MCH 28.5 12/31/2023   PLT 137 (L) 12/31/2023   MCHC 31.8 12/31/2023   RDW 17.9 (H) 12/31/2023   LYMPHSABS 1.5 12/26/2023   MONOABS 0.4 12/26/2023   EOSABS 0.1 12/26/2023   BASOSABS 0.0 12/26/2023     Last metabolic panel Lab Results  Component Value Date   NA 138 12/31/2023   K 3.5 12/31/2023   CL 108 12/31/2023   CO2 19 (L) 12/31/2023   BUN 22 (H) 12/31/2023   CREATININE 0.72 12/31/2023   GLUCOSE 111 (H) 12/31/2023   GFRNONAA >60 12/31/2023   GFRAA >60 11/10/2018   CALCIUM  8.8 (L) 12/31/2023   PHOS 4.6 12/30/2023   PROT 5.4 (L) 12/31/2023   ALBUMIN  2.7 (L) 12/31/2023   BILITOT 0.6 12/31/2023   ALKPHOS 76 12/31/2023   AST 48 (H) 12/31/2023   ALT 31 12/31/2023   ANIONGAP 11 12/31/2023    CBG (last 3)  Recent Labs    01/01/24 0004 01/01/24 0416 01/01/24 0749  GLUCAP 104* 118* 112*      Coagulation Profile: No results for input(s): "INR", "PROTIME" in the last 168 hours.   Radiology Studies: I have personally reviewed the imaging studies  No results  found.       Bertram Brocks M.D. [facility] 01/01/2024, 11:25 AM  Available via Epic secure chat 7am-7pm After 7 pm, please refer to night coverage provider listed on amion.

## 2024-01-01 NOTE — Plan of Care (Signed)
  Problem: Clinical Measurements: Goal: Respiratory complications will improve Outcome: Progressing Goal: Cardiovascular complication will be avoided Outcome: Progressing   Problem: Nutrition: Goal: Adequate nutrition will be maintained Outcome: Progressing   Problem: Coping: Goal: Level of anxiety will decrease Outcome: Progressing   Problem: Elimination: Goal: Will not experience complications related to urinary retention Outcome: Progressing   Problem: Skin Integrity: Goal: Risk for impaired skin integrity will decrease Outcome: Progressing

## 2024-01-01 NOTE — Progress Notes (Signed)
 Patient is back to the unit.

## 2024-01-01 NOTE — TOC Progression Note (Signed)
 Transition of Care [facility]) - Progression Note    Patient Details  Name: Meredith Park MRN: [id number] Date of Birth: September 17, 1970  Transition of Care [facility]) CM/SW Contact  Katrinka Parr, [place] Phone Number: 01/01/2024, 3:17 PM  Clinical Narrative:    Mitts need [facility] be off for [facility]. Mitts were placed on pt yesterday and removed today. TOC will continue to follow to assist with DC to [facility]. Per [employer], Siegfried Dress is approved through 5/5.    Expected Discharge Plan: Skilled Nursing Facility Barriers to Discharge: Continued Medical Work up  Expected Discharge Plan and Services In-house Referral: Clinical Social Work   Post Acute Care Choice: Skilled Nursing Facility Living arrangements for the past 2 months: Single Family Home                                       Social Determinants of Health (SDOH) Interventions SDOH Screenings   Food Insecurity: Low Risk  (07/18/2023)   Received from [facility]  Housing: Low Risk  (07/18/2023)   Received from [facility]  Transportation Needs: No Transportation Needs (07/18/2023)   Received from [facility]  Utilities: Low Risk  (07/18/2023)   Received from [facility]  Tobacco Use: Low Risk  (12/25/2023)  Recent Concern: Tobacco Use - Medium Risk (11/05/2023)   Received from [facility]    Readmission Risk Interventions    12/12/2023    3:50 PM  Readmission Risk Prevention Plan  Transportation Screening Complete  Medication Review (RN Care Manager) Complete  PCP or Specialist appointment within 3-5 days of discharge Complete  HRI or Home Care Consult Complete  SW Recovery Care/Counseling Consult Complete  Palliative Care Screening Not Applicable  Skilled Nursing Facility Complete

## 2024-01-01 NOTE — Progress Notes (Signed)
 Patient off the unit for MBS.

## 2024-01-01 NOTE — Evaluation (Signed)
 Modified Barium Swallow Study  Patient Details  Name: Meredith Park MRN: [id number] Date of Birth: July 22, 1971  Today's Date: 01/01/2024  Modified Barium Swallow completed.  Full report located under Chart Review in the Imaging Section.  History of Present Illness 53 yo female presenting s/p seizure with vomiting. Admitted with acute encephalopathy, acute respiratory failure, septic shock secondary to aspiration PNA. ETT 3/25-4/2; reintubated for stridor and secretions 4/2-4/7. MRI 4/6: diffuse cortical edema and slight increased vascularity to the  right hemisphere, possibly postictal. PMH: HTN, HLD, ETOH abuse hx, seizure. Previous SLUMS 2023: 26/30 (errors primarily with delayed recall, mild word-finding).   Clinical Impression Pt presents with a mild dysphagia related primarily to cognitive impairment and sensorimotor deficits left lower face.  There were mild delays in timing of laryngeal closure, leading to one episode of trace aspiration that led to coughing (occurred while imaging was turned off and trace aspirate was observed in trachea post-swallow). No further episodes of aspiration were observed; there was subsequent occasional transient penetration of thin liquids. There was spillage from left mouth and decreased ability to withdraw liquids from a straw - no large or successive liquid boluses could be tested. Mastication of solids was prolonged with oral residue post-swallow. Transport of boluses through pharynx was adequate with reliable clearance into and through the PES.  Primary barriers remain poor attention and initiation- she required max verbal cues in order to participate in study.  Physiology of swallow is relatively good and pt has excellent potential to resume an oral diet. Recommend continued NPO for now due to MS - SLP will begin therapeutic PO trials this week. Anticipate starting a modified PO diet by week's end. Factors that may increase risk of adverse event in  presence of aspiration Meredith Park & Meredith Park 2021): Reduced cognitive function;Dependence for feeding and/or oral hygiene  Swallow Evaluation Recommendations Recommendations: NPO;Alternative means of nutrition - G Tube    Meredith Park L. Meredith Lincoln, MA CCC/SLP Clinical Specialist - Acute Care SLP Acute Rehabilitation Services Office number [phone]   Meredith Park Meredith Park 01/01/2024,2:13 PM

## 2024-01-02 DIAGNOSIS — G40901 Epilepsy, unspecified, not intractable, with status epilepticus: Secondary | ICD-10-CM | POA: Diagnosis not present

## 2024-01-02 DIAGNOSIS — N179 Acute kidney failure, unspecified: Secondary | ICD-10-CM | POA: Diagnosis not present

## 2024-01-02 DIAGNOSIS — R4182 Altered mental status, unspecified: Secondary | ICD-10-CM | POA: Diagnosis not present

## 2024-01-02 LAB — GLUCOSE, CAPILLARY
Glucose-Capillary: 123 mg/dL — ABNORMAL HIGH (ref 70–99)
Glucose-Capillary: 141 mg/dL — ABNORMAL HIGH (ref 70–99)
Glucose-Capillary: 146 mg/dL — ABNORMAL HIGH (ref 70–99)

## 2024-01-02 MED ORDER — SODIUM BICARBONATE 650 MG PO TABS: 650.0000 mg | ORAL_TABLET | Freq: Three times a day (TID) | ORAL | Status: AC

## 2024-01-02 MED ORDER — CARVEDILOL 3.125 MG PO TABS: 3.1250 mg | ORAL_TABLET | Freq: Two times a day (BID) | ORAL | Status: AC

## 2024-01-02 MED ORDER — POLYETHYLENE GLYCOL 3350 17 G PO PACK: 17.0000 g | PACK | Freq: Every day | ORAL | Status: AC | PRN

## 2024-01-02 MED ORDER — HYDROCORTISONE 1 % EX CREA: TOPICAL_CREAM | CUTANEOUS | Status: AC | PRN

## 2024-01-02 MED ORDER — NYSTATIN 100000 UNIT/GM EX CREA: TOPICAL_CREAM | Freq: Two times a day (BID) | CUTANEOUS | Status: AC

## 2024-01-02 MED ORDER — CARVEDILOL 3.125 MG PO TABS: 3.1250 mg | ORAL_TABLET | Freq: Two times a day (BID) | ORAL | Status: DC

## 2024-01-02 MED ORDER — SERTRALINE HCL 25 MG PO TABS: 25.0000 mg | ORAL_TABLET | Freq: Every day | ORAL | Status: AC

## 2024-01-02 MED ORDER — JEVITY 1.5 CAL/FIBER PO LIQD: 1000.0000 mL | ORAL | Status: AC

## 2024-01-02 MED ORDER — INSULIN ASPART 100 UNIT/ML IJ SOLN
0.0000 [IU] | INTRAMUSCULAR | Status: AC
Start: 2024-01-02 — End: ?

## 2024-01-02 MED ORDER — IPRATROPIUM-ALBUTEROL 0.5-2.5 (3) MG/3ML IN SOLN
3.0000 mL | Freq: Four times a day (QID) | RESPIRATORY_TRACT | Status: AC | PRN
Start: 2024-01-02 — End: ?

## 2024-01-02 MED ORDER — QUETIAPINE FUMARATE 50 MG PO TABS: 50.0000 mg | ORAL_TABLET | Freq: Every day | ORAL | 0 refills | Status: AC

## 2024-01-02 MED ORDER — DOCUSATE SODIUM 50 MG/5ML PO LIQD: 100.0000 mg | Freq: Two times a day (BID) | ORAL | Status: AC | PRN

## 2024-01-02 MED ORDER — TOPIRAMATE 100 MG PO TABS: 100.0000 mg | ORAL_TABLET | Freq: Every day | ORAL | Status: DC

## 2024-01-02 MED ORDER — ADULT MULTIVITAMIN W/MINERALS CH: 1.0000 | ORAL_TABLET | Freq: Every day | ORAL | Status: AC

## 2024-01-02 MED ORDER — PROSOURCE TF20 ENFIT COMPATIBL EN LIQD: 60.0000 mL | Freq: Every day | ENTERAL | Status: AC

## 2024-01-02 MED ORDER — PANTOPRAZOLE SODIUM 40 MG PO PACK: 40.0000 mg | PACK | Freq: Two times a day (BID) | ORAL | Status: AC

## 2024-01-02 MED ORDER — PREDNISONE 10 MG PO TABS: ORAL_TABLET | ORAL | Status: AC

## 2024-01-02 MED ORDER — ATORVASTATIN CALCIUM 40 MG PO TABS: 40.0000 mg | ORAL_TABLET | Freq: Every day | ORAL | Status: AC

## 2024-01-02 MED ORDER — FREE WATER: 200.0000 mL | Status: AC

## 2024-01-02 NOTE — Plan of Care (Signed)
 Tolerated peg feeding. Turned per staff. Out with EMS to transfer to SNF via stretcher. Report given per [facility].    Problem: Education: Goal: Knowledge of General Education information will improve Description: Including pain rating scale, medication(s)/side effects and non-pharmacologic comfort measures Outcome: Adequate for Discharge   Problem: Health Behavior/Discharge Planning: Goal: Ability to manage health-related needs will improve Outcome: Adequate for Discharge   Problem: Clinical Measurements: Goal: Ability to maintain clinical measurements within normal limits will improve Outcome: Adequate for Discharge Goal: Will remain free from infection Outcome: Adequate for Discharge Goal: Diagnostic test results will improve Outcome: Adequate for Discharge Goal: Respiratory complications will improve Outcome: Adequate for Discharge Goal: Cardiovascular complication will be avoided Outcome: Adequate for Discharge   Problem: Activity: Goal: Risk for activity intolerance will decrease Outcome: Adequate for Discharge   Problem: Nutrition: Goal: Adequate nutrition will be maintained Outcome: Adequate for Discharge   Problem: Coping: Goal: Level of anxiety will decrease Outcome: Adequate for Discharge   Problem: Elimination: Goal: Will not experience complications related to bowel motility Outcome: Adequate for Discharge Goal: Will not experience complications related to urinary retention Outcome: Adequate for Discharge   Problem: Pain Managment: Goal: General experience of comfort will improve and/or be controlled Outcome: Adequate for Discharge   Problem: Safety: Goal: Ability to remain free from injury will improve Outcome: Adequate for Discharge   Problem: Skin Integrity: Goal: Risk for impaired skin integrity will decrease Outcome: Adequate for Discharge   Problem: Activity: Goal: Ability to tolerate increased activity will improve Outcome: Adequate for  Discharge   Problem: Respiratory: Goal: Ability to maintain a clear airway and adequate ventilation will improve Outcome: Adequate for Discharge   Problem: Role Relationship: Goal: Method of communication will improve Outcome: Adequate for Discharge   Problem: Education: Goal: Ability to describe self-care measures that may prevent or decrease complications (Diabetes Survival Skills Education) will improve Outcome: Adequate for Discharge Goal: Individualized Educational Video(s) Outcome: Adequate for Discharge   Problem: Coping: Goal: Ability to adjust to condition or change in health will improve Outcome: Adequate for Discharge   Problem: Fluid Volume: Goal: Ability to maintain a balanced intake and output will improve Outcome: Adequate for Discharge   Problem: Health Behavior/Discharge Planning: Goal: Ability to identify and utilize available resources and services will improve Outcome: Adequate for Discharge Goal: Ability to manage health-related needs will improve Outcome: Adequate for Discharge   Problem: Metabolic: Goal: Ability to maintain appropriate glucose levels will improve Outcome: Adequate for Discharge   Problem: Nutritional: Goal: Maintenance of adequate nutrition will improve Outcome: Adequate for Discharge Goal: Progress toward achieving an optimal weight will improve Outcome: Adequate for Discharge   Problem: Skin Integrity: Goal: Risk for impaired skin integrity will decrease Outcome: Adequate for Discharge   Problem: Tissue Perfusion: Goal: Adequacy of tissue perfusion will improve Outcome: Adequate for Discharge   Problem: Safety: Goal: Non-violent Restraint(s) Outcome: Adequate for Discharge   Problem: Education: Goal: Knowledge of disease or condition will improve Outcome: Adequate for Discharge Goal: Knowledge of secondary prevention will improve (MUST DOCUMENT ALL) Outcome: Adequate for Discharge Goal: Knowledge of patient specific  risk factors will improve (DELETE if not current risk factor) Outcome: Adequate for Discharge   Problem: Ischemic Stroke/TIA Tissue Perfusion: Goal: Complications of ischemic stroke/TIA will be minimized Outcome: Adequate for Discharge   Problem: Coping: Goal: Will verbalize positive feelings about self Outcome: Adequate for Discharge Goal: Will identify appropriate support needs Outcome: Adequate for Discharge   Problem: Health Behavior/Discharge Planning: Goal:  Ability to manage health-related needs will improve Outcome: Adequate for Discharge Goal: Goals will be collaboratively established with patient/family Outcome: Adequate for Discharge   Problem: Self-Care: Goal: Ability to participate in self-care as condition permits will improve Outcome: Adequate for Discharge Goal: Verbalization of feelings and concerns over difficulty with self-care will improve Outcome: Adequate for Discharge Goal: Ability to communicate needs accurately will improve Outcome: Adequate for Discharge

## 2024-01-02 NOTE — Discharge Summary (Signed)
 Physician Discharge Summary   Patient: Meredith Park MRN: [id number] DOB: [birth year]  Admit date:     11/27/2023  Discharge date: 01/02/24  Discharge Physician: Bertram Brocks, MD    PCP: Chancey Combe, [place]  E, PA-C   Recommendations at discharge:   Continue aspirin  81 mg daily, Lipitor 40 mg daily for secondary stroke prevention Continue prednisone  taper, 30 mg daily for 5 more days, then 20 mg daily for 7 days then 10 mg daily for 7 days then off Continue tube feeds Jevity 1.5, to the goal rate of 50 cc an hour Free water  200 cc every 4 hours Continue lamotrigine  250 mg twice daily  Discharge Diagnoses:    Status epilepticus (HCC) in the setting of history of seizure disorder Acute encephalopathy with agitation (HCC) Elevated troponins likely due to demand ischemia, new onset cardiomyopathy   Acute systolic heart failure (HCC)   AKI (acute kidney injury) (HCC)   Acute hypoxemic respiratory failure (HCC)   Shock liver/acute transaminitis   Elevated liver enzymes   Protein-calorie malnutrition, severe   Acute ischemic stroke [facility])    Hospital Course:  53 year old female with history of alcohol abuse, HLD, seizures, hypertension, abstinence from alcohol for last 1 year, found down beside her bed by her family members, was noted to be shaking and unresponsive.  EMS was called and was found to be in generalized tonic-clonic seizures, treated with Versed  with cessation of seizures, remained minimally responsive in the ED.  Hypoxemic requiring 10 L O2 and was intubated to protect airway and admitted to the ICU. Significant events as below. 3/25 admit to ICU.  Hypoxemic and unresponsive.  Intubated 3/26 LP performed. Increasing pressor requirement 3/27 Increased vasopressor requirement with worsening MOD including AKI, shock liver. Echo with stress CM. 3/29 Improved vasopressor requirements 4/2 central line d/c, extubated (had cuff leak), reintubated for stridor & secretions  several hours later 4/5 CTH w/progression of unihemispheric edema, thought more c/w seizure changes 4/6 LTM, steroids, 4/7 extubated, intermittently on predex for agitation, LTM d/c'd, tmax 101.6/ WBC stable 4/10 on room air.  Transferred to medical floor.  Core track feeding. 4/11 full of secretions unable to clear , requiring non- rebreather, transferred back to ICU 4/14: Transferred out of ICU, [facility] 4/17 patient working with SLP making progress. PEG placement requested.  4/18 MRI brain reviewed and neurology signed off. 4/21 IR unable to place the PEG due to colon / safe window. Gen surgery consulted. 4/22 surgical PEG placement today. Overnight patient had some blood mixed with stool in flexi seal positive for occult blood. GI consulted for recommendations.  4/26: Abdominal pain, KUB: Ileus, tube feeds held 4/29: Tolerating tube feeds, medically stable    Assessment and Plan:   Status epilepticus (HCC) in the setting of history of seizure disorder Acute encephalopathy secondary to status epilepticus - Right cerebral hemisphere edema consistent with postictal state, still has left-sided weakness -Neurology was consulted and the suspect this is postsurgical versus potential autoimmune/paraneoplastic/vasculitis. -CT of the chest abdomen and pelvis without any signs of malignancy -Neurology recommends to continue with Topamax  100 mg daily, lamotrigine  250 mg BID, continue with Prednisone  taper by 10 mg every week. -Currently she is on prednisone  30 mg daily starting from 4/29, taper by 10 mg weekly.   - Repeat MRI brain showed recent punctate infarcts in the left temporal subcortical white matter and superior right cerebellum. Evolutionary changes throughout the right cerebral and left cerebellar hemispheres as described above most compatible with evolving injury from recent  status epilepticus. -  on aspirin  81 mg daily and lipitor 40 mg daily for secondary stroke prevention.  - Outpatient  follow-up with our neurologist, Dr. Jolene Nearing at [facility]   Acute hypoxic respiratory failure secondary to aspiration versus volume overload -Patient was initially intubated for status epilepticus and airway protection, subsequently reintubated due to aspiration versus volume overload, has been extubated - On room air, completed course of antibiotics.  - 2D echo showed LVEF of 40 to 45%, left ventricle demonstrates regional wall abnormalities.      Dysphagia/ Nutrition -Patient was placed on core track, failed SLP evaluation -Palliative medicine consulted for goals of care/PEG tubes:  Continue with full scope treatments with aggressive life prolonging interventions including PEG.  -IR unable to place PEG due to no safe window.  - Surgery consulted and underwent EGD and PEG tube placement on 4/22, Tube feeds started -Per GI today, no intervention is needed as endoscopy was completed by surgery on 4/22 and was unremarkable. -Tolerating tube feeds, ileus has improved.  IV Reglan , fluids for 24 hours, Compazine  as needed     New onset cardiomyopathy, elevated troponin likely due to demand ischemia Hypertension - 2D echo 11/28/2023 showed EF of 40 to 45%, G1 DD, regional WMA -Seen by cardiology, recommending conservative management for now.  - Continue Coreg , cardiology recommended repeat echo in near future and if she has meaningful neurological recovery to reach out to cardiology for further work up of new onset cardiomyopathy.  - Continue Coreg  3.125 mg p.o. twice daily, losartan  on hold     Acute Kidney injury secondary to rhabdomyolysis, ATN, metabolic acidosis Creatinine is back to baseline,  - Continue sodium bicarb 650 mg 3 times daily for 1 week - Improving   Agitation, acute encephalopathy - Continue Seroquel  50 mg at bedtime   Hypernatremia.  Resolved.      Anemia of acute illness/ guaiac positive stools:  Per GI, no intervention needed as endoscopy was completed by surgery on  4/22 and was unremarkable Continue PPI  - H&H stable   Acute transaminitis likely due to shock liver Improved     Hyperglycemia probably secondary to steroids and tube feeds -Continue sliding scale insulin      Leukocytosis  -Likely from steroids, resolved   Severe protein calorie malnutrition Nutrition Problem: Severe Malnutrition Etiology: social / environmental circumstances Signs/Symptoms: severe muscle depletion, percent weight loss   Interventions: Prostat, Tube feeding, MVI   Estimated body mass index is 24.48 kg/m as calculated from the following:   Height as of this encounter: 5\' 2"  (1.575 m).   Weight as of this encounter: 60.7 kg.     Pain control - [place]  Controlled Substance Reporting System database was reviewed. and patient was instructed, not to drive, operate heavy machinery, perform activities at heights, swimming or participation in water  activities or provide baby-sitting services while on Pain, Sleep and Anxiety Medications; until their outpatient Physician has advised to do so again. Also recommended to not to take more than prescribed Pain, Sleep and Anxiety Medications.  Consultants: Palliative medicine, CCM, neurology Procedures performed:   Disposition: Skilled nursing facility Diet recommendation: On tube feeds  DISCHARGE MEDICATION: Allergies as of 01/02/2024       Reactions   Meperidine Itching   Hydrochlorothiazide Other (See Comments)   Raises ammonia level   Keppra  [levetiracetam ] Other (See Comments)   Behavioral, suicidal ideation         Medication List     STOP taking these medications  clonazePAM  0.5 MG tablet Commonly known as: KLONOPIN    ibuprofen  200 MG tablet Commonly known as: ADVIL    losartan  50 MG tablet Commonly known as: COZAAR    Nayzilam  5 MG/0.1ML Soln Generic drug: Midazolam    SUMAtriptan  100 MG tablet Commonly known as: IMITREX        TAKE these medications    atorvastatin  40 MG  tablet Commonly known as: LIPITOR Place 1 tablet (40 mg total) into feeding tube daily. Start taking on: Jan 03, 2024 What changed:  medication strength how much to take how to take this   carvedilol  3.125 MG tablet Commonly known as: COREG  Place 1 tablet (3.125 mg total) into feeding tube 2 (two) times daily with a meal. What changed:  medication strength how much to take how to take this when to take this   docusate 50 MG/5ML liquid Commonly known as: COLACE Take 10 mLs (100 mg total) by mouth 2 (two) times daily as needed for mild constipation.   feeding supplement (JEVITY 1.5 CAL/FIBER) Liqd Place 1,000 mLs into feeding tube continuous.   feeding supplement (PROSource TF20) liquid Place 60 mLs into feeding tube daily. Start taking on: Jan 03, 2024   free water  Soln Place 200 mLs into feeding tube every 4 (four) hours.   hydrocortisone  cream 1 % Apply topically as needed for itching. Apply to Right side of Neck   insulin  aspart 100 UNIT/ML injection Commonly known as: novoLOG  Inject 0-9 Units into the skin every 4 (four) hours. Sliding scale CBG 70 - 120: 0 units CBG 121 - 150: 1 unit,  CBG 151 - 200: 2 units,  CBG 201 - 250: 3 units,  CBG 251 - 300: 5 units,  CBG 301 - 350: 7 units,  CBG 351 - 400: 9 units   CBG > 400: 9 units and notify your MD   ipratropium-albuterol  0.5-2.5 (3) MG/3ML Soln Commonly known as: DUONEB Take 3 mLs by nebulization every 6 (six) hours as needed.   lamoTRIgine  25 MG tablet Commonly known as: LAMICTAL  Take 50 mg by mouth 2 (two) times daily. Take with one (200mg ) tablet by mouth twice daily for a dose total of 250mg .   lamoTRIgine  200 MG tablet Commonly known as: LAMICTAL  Take 200 mg by mouth 2 (two) times daily. Take medication with two (25mg ) tablet by mouth twice daily for a total of 250mg .   multivitamin with minerals Tabs tablet Place 1 tablet into feeding tube daily. Start taking on: Jan 03, 2024   nystatin  cream Commonly  known as: MYCOSTATIN  Apply topically 2 (two) times daily.   pantoprazole  sodium 40 mg Commonly known as: PROTONIX  Place 40 mg into feeding tube 2 (two) times daily.   polyethylene glycol 17 g packet Commonly known as: MIRALAX  / GLYCOLAX  Place 17 g into feeding tube daily as needed for moderate constipation.   predniSONE  10 MG tablet Commonly known as: DELTASONE  Place 3 tablets (30 mg total) into feeding tube daily with breakfast for 5 days, THEN 2 tablets (20 mg total) daily with breakfast for 7 days, THEN 1 tablet (10 mg total) daily with breakfast for 7 days. Start taking on: Jan 03, 2024   QUEtiapine  50 MG tablet Commonly known as: SEROQUEL  Place 1 tablet (50 mg total) into feeding tube at bedtime.   sertraline  25 MG tablet Commonly known as: ZOLOFT  Place 1 tablet (25 mg total) into feeding tube at bedtime. What changed:  medication strength how much to take how to take this when to take this  sodium bicarbonate  650 MG tablet Place 1 tablet (650 mg total) into feeding tube 3 (three) times daily.   topiramate  100 MG tablet Commonly known as: TOPAMAX  Place 1 tablet (100 mg total) into feeding tube at bedtime. What changed:  how to take this when to take this               Discharge Care Instructions  (From admission, onward)           Start     Ordered   01/02/24 0000  If the dressing is still on your incision site when you go home, remove it on the third day after your surgery date. Remove dressing if it begins to fall off, or if it is dirty or damaged before the third day.        01/02/24 1139            Follow-up Information     [place], [place]  E, PA-C. Schedule an appointment as soon as possible for a visit in 2 week(s).   Specialty: Family Medicine Why: for hospital follow-up Contact information: [address] [place] [address] [phone]         Janna Melter, MD. Schedule an appointment as soon as  possible for a visit in 2 week(s).   Specialty: Neurology Why: for hospital follow-up Contact information: [address] [facility] [place] [address] [phone]                Discharge Exam: Cleavon Curls Weights   12/31/23 0454 01/01/24 0500 01/02/24 0500  Weight: 62.8 kg 60.7 kg 62.7 kg   S: Seen this morning, no acute complaints, speaking in whispering voice.  Tolerating tube feeds.  No acute issues.  BP 105/64 (BP Location: Right Arm)   Pulse 83   Temp 98.9 F (37.2 C) (Oral)   Resp 18   Ht 5\' 2"  (1.575 m)   Wt 62.7 kg   LMP 08/28/2020   SpO2 98%   BMI 25.28 kg/m   Physical Exam General: Alert and awake, NAD, speaks softly, follows commands Cardiovascular: S1 S2 clear, RRR.  Respiratory: CTAB, no wheezing Gastrointestinal: Soft, nontender, nondistended, NBS Ext: heel protector on the left Neuro: left sided weakness Psych: Normal affect    Condition at discharge: fair  The results of significant diagnostics from this hospitalization (including imaging, microbiology, ancillary and laboratory) are listed below for reference.   Imaging Studies: DG Swallowing Func-Speech Pathology Result Date: 01/01/2024 Table formatting from the original result was not included. Modified Barium Swallow Study Patient Details Name: Terriana Knoff MRN: [id number] Date of Birth: [birth year] Today's Date: 01/01/2024 HPI/PMH: HPI: 52 yo female presenting s/p seizure with vomiting. Admitted with acute encephalopathy, acute respiratory failure, septic shock secondary to aspiration PNA. ETT 3/25-4/2; reintubated for stridor and secretions 4/2-4/7. MRI 4/6: diffuse cortical edema and slight increased vascularity to the  right hemisphere, possibly postictal. PMH: HTN, HLD, ETOH abuse hx, seizure. Previous SLUMS 2023: 26/30 (errors primarily with delayed recall, mild word-finding). Clinical Impression: Clinical Impression: Pt presents with a mild dysphagia related primarily  to cognitive impairment and sensorimotor deficits left lower face.  There were mild delays in timing of laryngeal closure, leading to one episode of trace aspiration that led to coughing (occurred while imaging was turned off and trace aspirate was observed in trachea post-swallow). No further episodes of aspiration were observed; there was subsequent occasional transient penetration of thin liquids. There was spillage from left  mouth and decreased ability to withdraw liquids from a straw - no large or successive liquid boluses could be tested. Mastication of solids was prolonged with oral residue post-swallow. Transport of boluses through pharynx was adequate with reliable clearance into and through the PES.  Primary barriers remain poor attention and initiation- she required max verbal cues in order to participate in study.  Physiology of swallow is relatively good and pt has excellent potential to resume an oral diet. Recommend continued NPO for now due to MS - SLP will begin therapeutic PO trials this week. Anticipate starting a modified PO diet by week's end. Factors that may increase risk of adverse event in presence of aspiration Roderick Civatte & Jessy Morocco 2021): Factors that may increase risk of adverse event in presence of aspiration Roderick Civatte & Jessy Morocco 2021): Reduced cognitive function; Dependence for feeding and/or oral hygiene Recommendations/Plan: Swallowing Evaluation Recommendations Swallowing Evaluation Recommendations Recommendations: NPO; Alternative means of nutrition - G Tube Treatment Plan Treatment Plan Treatment recommendations: Therapy as outlined in treatment plan below Functional status assessment: Patient has had a recent decline in their functional status and demonstrates the ability to make significant improvements in function in a reasonable and predictable amount of time. Treatment frequency: Min 2x/week Treatment duration: 2 weeks Interventions: Oropharyngeal exercises; Trials of upgraded  texture/liquids; Diet toleration management by SLP Recommendations Recommendations for follow up therapy are one component of a multi-disciplinary discharge planning process, led by the attending physician.  Recommendations may be updated based on patient status, additional functional criteria and insurance authorization. Assessment: Orofacial Exam: Orofacial Exam Oral Cavity: Oral Hygiene: WFL Oral Cavity - Dentition: Adequate natural dentition Orofacial Anatomy: WFL Oral Motor/Sensory Function: WFL Anatomy: Anatomy: WFL Boluses Administered: Boluses Administered Boluses Administered: Thin liquids (Level 0); Mildly thick liquids (Level 2, nectar thick); Puree; Solid  Oral Impairment Domain: Oral Impairment Domain Lip Closure: Escape progressing to mid-chin Tongue control during bolus hold: Not tested Bolus preparation/mastication: Slow prolonged chewing/mashing with complete recollection Bolus transport/lingual motion: Slow tongue motion Oral residue: Trace residue lining oral structures Location of oral residue : Tongue; Lateral sulci Initiation of pharyngeal swallow : Valleculae  Pharyngeal Impairment Domain: Pharyngeal Impairment Domain Soft palate elevation: No bolus between soft palate (SP)/pharyngeal wall (PW) Laryngeal elevation: Complete superior movement of thyroid cartilage with complete approximation of arytenoids to epiglottic petiole Anterior hyoid excursion: Complete anterior movement Epiglottic movement: Complete inversion Laryngeal vestibule closure: Incomplete, narrow column air/contrast in laryngeal vestibule Pharyngeal stripping wave : Present - complete Pharyngeal contraction (A/P view only): N/A Pharyngoesophageal segment opening: Complete distension and complete duration, no obstruction of flow Tongue base retraction: No contrast between tongue base and posterior pharyngeal wall (PPW) Pharyngeal residue: Complete pharyngeal clearance  Esophageal Impairment Domain: No data recorded Pill: Pill  Consistency administered: -- (nt) Penetration/Aspiration Scale Score: Penetration/Aspiration Scale Score 7.  Material enters airway, passes BELOW cords and not ejected out despite cough attempt by patient: Thin liquids (Level 0) Compensatory Strategies: Compensatory Strategies Compensatory strategies: No   General Information: Caregiver present: No  Diet Prior to this Study: NPO; Cortrak/Small bore NG tube   Temperature : Normal   Respiratory Status: WFL   Supplemental O2: None (Room air)   History of Recent Intubation: Yes  Behavior/Cognition: Alert No data recorded Baseline vocal quality/speech: Aphonic Volitional Cough: Able to elicit Volitional Swallow: Unable to elicit No data recorded Goal Planning: Prognosis for improved oropharyngeal function: Good Barriers to Reach Goals: Cognitive deficits No data recorded No data recorded Consulted and agree with results and  recommendations: Pt unable/family or caregiver not available Pain: Pain Assessment Pain Assessment: Faces Faces Pain Scale: 2 Pain Location: shake head no for pain, shakes head yes for nausea Pain Descriptors / Indicators: Discomfort (nausea) Pain Intervention(s): Limited activity within patient's tolerance; Monitored during session; Other (comment) (RN notified) End of Session: Start Time:SLP Start Time (ACUTE ONLY): 1200 Stop Time: SLP Stop Time (ACUTE ONLY): 1222 Time Calculation:SLP Time Calculation (min) (ACUTE ONLY): 22 min Charges: SLP Evaluations $ SLP Speech Visit: 1 Visit SLP Evaluations $MBS Swallow: 1 Procedure SLP visit diagnosis: SLP Visit Diagnosis: Dysphagia, oropharyngeal phase (R13.12) Past Medical History: Past Medical History: Diagnosis Date  Alcohol abuse   Alcohol withdrawal (HCC) 08/28/2018  High cholesterol   Hypertension   Seizures (HCC)  Past Surgical History: Past Surgical History: Procedure Laterality Date  IR GASTROSTOMY TUBE MOD SED  12/24/2023  PEG PLACEMENT N/A 12/25/2023  Procedure: INSERTION, PEG TUBE;  Surgeon: Anda Bamberg, MD;  Location: [facility] OR;  Service: General;  Laterality: N/A;  PERCUTANEOUS ENDOSCOPIC GASTROSTOMY TUBE PLACEMENT Amanda L. Beatris Lincoln, MA CCC/SLP Clinical Specialist - Acute Care SLP Acute Rehabilitation Services Office number [phone] Myna Asal Laurice 01/01/2024, 2:14 PM  DG Abd Portable 1V Result Date: 12/29/2023 CLINICAL DATA:  Abdominal pain EXAM: PORTABLE ABDOMEN - 1 VIEW COMPARISON:  12/24/2023 FINDINGS: Left upper quadrant gastrostomy tube. Gaseous distension of the colon is again seen in appears unchanged from previous exam. Mild small bowel dilatation within the right lower quadrant is improved in the interval. IMPRESSION: 1. Persistent gaseous distension of the colon. 2. Improved small bowel dilatation within the right lower quadrant. Electronically Signed   By: Kimberley Penman M.D.   On: 12/29/2023 08:48   IR GASTROSTOMY TUBE MOD SED Result Date: 12/25/2023 CLINICAL DATA:  Mental status changes, needs enteral feeding support EXAM: PERC PLACEMENT GASTROSTOMY (ATTEMPTED) FLUOROSCOPY: Radiation Exposure Index (as provided by the fluoroscopic device): 26.4 mGy air Kerma TECHNIQUE: As antibiotic prophylaxis, cefazolin  2 g was ordered pre-procedure and administered intravenously within one hour of incision. A safe percutaneous approach was confirmed from recent previous CT. A 5 French angiographic catheter was placed as orogastric tube. The upper abdomen was prepped with chlorhexidine , draped in usual sterile fashion, and infiltrated locally with 1% lidocaine . Intravenous Fentanyl  50mcg and Versed  1mg  were administered by RN during a total moderate (conscious) sedation time of 6 minutes; the patient's level of consciousness and physiological / cardiorespiratory status were monitored continuously by radiology RN under my direct supervision. Stomach was insufflated using air through the orogastric tube. Despite the findings confirmed on previous CT, gaseous distended transverse colon is  positioned anterior to the gastric body region, precluding safe percutaneous gastrostomy placement. The orogastric tube was removed and the procedure was terminated. COMPLICATIONS: COMPLICATIONS none IMPRESSION: 1. 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 Consider surgical consultation for gastrostomy placement. Electronically Signed   By: Nicoletta Barrier M.D.   On: 12/25/2023 07:37   DG Abd Portable 1V Result Date: 12/24/2023 CLINICAL DATA:  Feeding tube placement. EXAM: PORTABLE ABDOMEN - 1 VIEW COMPARISON:  Radiographs 12/16/2023.  CT 12/20/2023. FINDINGS: 1322 hours. Feeding tube projects over the lower lumbar spine, consistent with location at the junction of the 2nd and 3rd portions of the duodenum. Similar mild gaseous distension of the small and large bowel consistent with a mild ileus. No osseous abnormalities are identified. IMPRESSION: Feeding tube tip projects at the junction of the 2nd and 3rd portions of the duodenum. Electronically  Signed   By: Elmon Hagedorn M.D.   On: 12/24/2023 16:51   CT ABDOMEN WO CONTRAST Result Date: 12/20/2023 CLINICAL DATA:  Seizure disorder, encephalopathy and evaluation for possible percutaneous gastrostomy tube placement for long-term nutritional needs. EXAM: CT ABDOMEN WITHOUT CONTRAST TECHNIQUE: Multidetector CT imaging of the abdomen was performed following the standard protocol without IV contrast. RADIATION DOSE REDUCTION: This exam was performed according to the departmental dose-optimization program which includes automated exposure control, adjustment of the mA and/or kV according to patient size and/or use of iterative reconstruction technique. COMPARISON:  12/09/2023 FINDINGS: Lower chest: Mild bibasilar atelectasis. Small amount of pericardial fluid at the base of the heart. Hepatobiliary: No focal liver abnormality is seen. No gallstones, gallbladder wall thickening, or biliary  dilatation. Pancreas: Unremarkable. No pancreatic ductal dilatation or surrounding inflammatory changes. Spleen: Normal in size without focal abnormality. Adrenals/Urinary Tract: No adrenal masses. Nonobstructing calculus in the lower pole of the left kidney measures approximately 4 mm. Stomach/Bowel: No hiatal hernia. Feeding tube enters the stomach and terminates in the second portion of the duodenum. Bowel shows mild ileus involving predominantly the colon. There is a percutaneous window for gastrostomy tube placement with the stomach opposing the abdominal wall between the tip of the left lobe of the liver and the splenic flexure. No free intraperitoneal air identified. Vascular/Lymphatic: Stable atherosclerosis of the abdominal aorta without aneurysm. No enlarged abdominal lymph nodes. Other: No abdominal wall hernia, ascites or abnormal fluid collection. Musculoskeletal: No acute or significant osseous findings. IMPRESSION: 1. Percutaneous window for gastrostomy tube placement present with the stomach opposing the abdominal wall between the tip of the left lobe of the liver and the splenic flexure of the colon. 2. Mild ileus involving predominantly the colon. 3. Nonobstructing 4 mm calculus in the lower pole of the left kidney. 4. Aortic atherosclerosis. Electronically Signed   By: Erica Hau M.D.   On: 12/20/2023 16:56   MR BRAIN WO CONTRAST Result Date: 12/20/2023 CLINICAL DATA:  Delirium.  Recent status epilepticus. EXAM: MRI HEAD WITHOUT CONTRAST TECHNIQUE: Multiplanar, multiecho pulse sequences of the brain and surrounding structures were obtained without intravenous contrast. COMPARISON:  Head MRI 12/09/2023 and 12/08/2023 FINDINGS: Brain: Widespread diffusion restriction throughout cortex and much of the white matter of the right cerebral hemisphere on the 12/08/2023 MRI has decreased in intensity. There is persistent involvement of the basal ganglia and dorsal right thalamus. There is  persistent corresponding T2 hyperintensity throughout right cerebral hemispheric cortex, and extensive confluent T2 hyperintensity has also now developed throughout the white matter. However, the overall degree of brain swelling has decreased with resolution of mass effect on the right lateral ventricle and resolved midline shift. There is new mild cortical T1 hyperintensity throughout much of the right cerebral hemisphere suggestive of laminar necrosis. Diffusion restriction and T2 hyperintensity in the left cerebellar hemisphere have decreased. Punctate foci of restricted diffusion/recent infarcts are again seen in the left temporal subcortical white matter. A punctate focus of restricted diffusion superiorly in the right cerebellar hemisphere is new (series 10, image 17). No intracranial hemorrhage or extra-axial fluid collection is evident. There is mild cerebral atrophy. A partially empty sella is unchanged. Vascular: Major intracranial vascular flow voids are preserved. Skull and upper cervical spine: Unremarkable bone marrow signal. Sinuses/Orbits: Trace bilateral mastoid fluid. Clear paranasal sinuses. Unremarkable orbits. Other: None. IMPRESSION: 1. Evolutionary changes throughout the right cerebral and left cerebellar hemispheres as described above most compatible with evolving injury from recent status epilepticus. T2 hyperintensity throughout  the right cerebral hemispheric white matter and basal ganglia has increased, however brain swelling has decreased with resolved mass effect. New cortical laminar necrosis. 2. 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 Electronically Signed   By: Aundra Lee M.D.   On: 12/20/2023 15:37   DG CHEST PORT 1 VIEW Result Date: 12/19/2023 CLINICAL DATA:  Follow-up basilar consolidation EXAM: PORTABLE CHEST 1 VIEW COMPARISON:  12/14/2023, 12/15/2023 FINDINGS: Gastric catheter is noted in  the stomach. The tip is not evaluated on this exam. Lungs are clear. Cardiac shadow is within normal limits. No bony abnormality is noted. IMPRESSION: No acute abnormality noted. Electronically Signed   By: Violeta Grey M.D.   On: 12/19/2023 19:17   DG Abd Portable 1V Result Date: 12/16/2023 CLINICAL DATA:  [id number] Encounter for orogastric (OG) tube placement [id number] EXAM: PORTABLE ABDOMEN - 1 VIEW COMPARISON:  None Available. FINDINGS: Esophagogastric tube is well positioned terminating in the stomach. Nonobstructive bowel gas pattern. No pneumoperitoneum. No organomegaly or radiopaque calculi. No acute fracture or destructive lesion. The lung bases are clear. IMPRESSION: Well-positioned esophagogastric tube terminating in the stomach. Electronically Signed   By: Rance Burrows M.D.   On: 12/16/2023 10:02   CT Angio Chest Pulmonary Embolism (PE) W or WO Contrast Result Date: 12/15/2023 CLINICAL DATA:  Suspected high prob pulmonary embolism EXAM: CT ANGIOGRAPHY CHEST WITH CONTRAST TECHNIQUE: Multidetector CT imaging of the chest was performed using the standard protocol during bolus administration of intravenous contrast. Multiplanar CT image reconstructions and MIPs were obtained to evaluate the vascular anatomy. RADIATION DOSE REDUCTION: This exam was performed according to the departmental dose-optimization program which includes automated exposure control, adjustment of the mA and/or kV according to patient size and/or use of iterative reconstruction technique. CONTRAST:  75mL OMNIPAQUE  IOHEXOL  350 MG/ML SOLN COMPARISON:  12/09/2023 FINDINGS: Cardiovascular: Heart size normal. Small-moderate pericardial effusion as before. The RV is nondilated. Satisfactory opacification of pulmonary arteries noted, and there is no evidence of pulmonary emboli. Adequate contrast opacification of the thoracic aorta with no evidence of dissection, aneurysm, or stenosis. There is classic 3-vessel brachiocephalic arch anatomy  without proximal stenosis. Minimal calcified plaque in the descending thoracic aorta. Mediastinum/Nodes: No mass, hematoma, or adenopathy. Lungs/Pleura: No pleural effusion. No pneumothorax. Dependent consolidation in the lower lobes left greater than right, increased from previous. Upper Abdomen: Feeding tube extends at least as far as the stomach, tip not seen. Mild splenomegaly. No acute findings. Musculoskeletal: No chest wall abnormality. No acute or significant osseous findings. Review of the MIP images confirms the above findings. IMPRESSION: 1. Negative for acute PE or thoracic aortic dissection. 2. Increasing dependent consolidation in the lower lobes left greater than right. 3. Small-moderate pericardial effusion. Electronically Signed   By: Nicoletta Barrier M.D.   On: 12/15/2023 10:52   Overnight EEG with video Result Date: 12/15/2023 Arleene Lack, MD     12/16/2023  6:30 AM Patient Name: Dru Verdoorn MRN: [id number] Epilepsy Attending: Arleene Lack Referring Physician/Provider: Roxy Cordial MD Duration: 12/14/2023 1316 to 12/15/2023 1316  Patient history: 53 y.o. female with hx of alcohol and substance use, Htn, seizures on lamotrigine  and topamax  who was found seizing for unknown duration. EEG to evaluate for seizure  Level of alertness:  lethargic , asleep  AEDs during EEG study: LTG, TPM, LCM  Technical aspects: This EEG study was done with scalp electrodes positioned according to the 10-20 International system of  electrode placement. Electrical activity was reviewed with band pass filter of 1-70Hz , sensitivity of 7 uV/mm, display speed of 61mm/sec with a 60Hz  notched filter applied as appropriate. EEG data were recorded continuously and digitally stored.  Video monitoring was available and reviewed as appropriate.  Description: EEG showed continuous low amplitude 2-3Hz  delta slowing in right fronto-temporal region.  EEG also showed 3-7hz  theta-delta slowing in left hemisphere. Sharp waves  were noted in left frontotemporal region. Sleep was characterized by vertex waves, sleep spindles (12 to 14 Hz), maximal frontocentral region. Hyperventilation and photic stimulation were not performed.   Event button was pressed on 12/14/2023 at 1450 for right gaze deviation and waving right arm. Concomitant EEG before, during and after the event didn't show any EEG change to suggest seizure.  ABNORMALITY -Sharp wave, left frontotemporal region - Continuous slow, generalized and maximal right fronto-temporal region  IMPRESSION: This study showed evidence of epileptogenicity arising from left frontotemporal region. Additionally there is cortical dysfunction arising from right fronto-temporal region likely secondary to underlying structural abnormality. Lastly there is moderate diffuse encephalopathy. No seizures were noted. Event button was pressed on 12/14/2023 at 1450 for right gaze deviation and waving right arm without concomitant EEG change. This was not an epileptic event.  Arleene Lack   DG CHEST PORT 1 VIEW Result Date: 12/14/2023 CLINICAL DATA:  Shortness of breath.  Seizures. EXAM: PORTABLE CHEST 1 VIEW COMPARISON:  CT 12/09/2023.  X-ray 12/05/2023. FINDINGS: Enteric tube in place with tip extending beneath the diaphragm. No pneumothorax, effusion or edema. Normal cardiopericardial silhouette. Kyphotic x-ray with rotation limits evaluation the right lung apex. No consolidation. Overlapping cardiac leads. IMPRESSION: Enteric tube.  No acute cardiopulmonary disease. Electronically Signed   By: Adrianna Horde M.D.   On: 12/14/2023 10:54   VAS US  LOWER EXTREMITY VENOUS (DVT) Result Date: 12/11/2023  Lower Venous DVT Study Patient Name:  IVIS LAWWILL  Date of Exam:   12/11/2023 Medical Rec #: [id number]            Accession #:    [id number] Date of Birth: [birth year]             Patient Gender: F Patient Age:   35 years Exam Location:  [facility] Procedure:      VAS US  LOWER EXTREMITY VENOUS  (DVT) Referring Phys: Felton Hough --------------------------------------------------------------------------------  Indications: Edema.  Risk Factors: None identified. Comparison Study: No significant changes seen since previous exam 11/28/23. Performing Technologist: Estanislao Heimlich  Examination Guidelines: A complete evaluation includes B-mode imaging, spectral Doppler, color Doppler, and power Doppler as needed of all accessible portions of each vessel. Bilateral testing is considered an integral part of a complete examination. Limited examinations for reoccurring indications may be performed as noted. The reflux portion of the exam is performed with the patient in reverse Trendelenburg.  +---------+---------------+---------+-----------+----------+--------------+ RIGHT    CompressibilityPhasicitySpontaneityPropertiesThrombus Aging +---------+---------------+---------+-----------+----------+--------------+ CFV      Full           Yes      Yes                                 +---------+---------------+---------+-----------+----------+--------------+ SFJ      Full                                                        +---------+---------------+---------+-----------+----------+--------------+  FV Prox  Full                                                        +---------+---------------+---------+-----------+----------+--------------+ FV Mid   Full                                                        +---------+---------------+---------+-----------+----------+--------------+ FV DistalFull                                                        +---------+---------------+---------+-----------+----------+--------------+ PFV      Full                    Yes                                 +---------+---------------+---------+-----------+----------+--------------+ POP      Full           Yes      Yes                                  +---------+---------------+---------+-----------+----------+--------------+ PTV      Full                    Yes                                 +---------+---------------+---------+-----------+----------+--------------+ PERO     Full                    Yes                                 +---------+---------------+---------+-----------+----------+--------------+   +---------+---------------+---------+-----------+----------+--------------+ LEFT     CompressibilityPhasicitySpontaneityPropertiesThrombus Aging +---------+---------------+---------+-----------+----------+--------------+ CFV      Full           Yes      Yes                                 +---------+---------------+---------+-----------+----------+--------------+ SFJ      Full                                                        +---------+---------------+---------+-----------+----------+--------------+ FV Prox  Full                                                        +---------+---------------+---------+-----------+----------+--------------+  FV Mid   Full                                                        +---------+---------------+---------+-----------+----------+--------------+ FV DistalFull                                                        +---------+---------------+---------+-----------+----------+--------------+ PFV      Full                                                        +---------+---------------+---------+-----------+----------+--------------+ POP      Full           Yes      Yes                                 +---------+---------------+---------+-----------+----------+--------------+ PTV      Full                    Yes                                 +---------+---------------+---------+-----------+----------+--------------+ PERO     Full                    Yes                                  +---------+---------------+---------+-----------+----------+--------------+     Summary: BILATERAL: - No evidence of deep vein thrombosis seen in the lower extremities, bilaterally. -No evidence of popliteal cyst, bilaterally.   *See table(s) above for measurements and observations. Electronically signed by Breella Kell MD on 12/11/2023 at 7:48:16 PM.    Final    Overnight EEG with video Result Date: 12/10/2023 Arleene Lack, MD     12/10/2023 11:23 AM Patient Name: Angilee Wooldridge MRN: [id number] Epilepsy Attending: Arleene Lack Referring Physician/Provider: Khaliqdina, Salman, MD Duration: 12/09/2023 1132 to 12/10/2023 1022  Patient history: 53 y.o. female with hx of alcohol and substance use, Htn, seizures on lamotrigine  and topamax  who was found seizing for unknown duration. EEG to evaluate for seizure  Level of alertness:  lethargic , asleep  AEDs during EEG study: LTG, TPM, LCM  Technical aspects: This EEG study was done with scalp electrodes positioned according to the 10-20 International system of electrode placement. Electrical activity was reviewed with band pass filter of 1-70Hz , sensitivity of 7 uV/mm, display speed of 77mm/sec with a 60Hz  notched filter applied as appropriate. EEG data were recorded continuously and digitally stored.  Video monitoring was available and reviewed as appropriate.  Description: EEG showed continuous low amplitude 2-3Hz  Hz theta-delta slowing in right fronto-temporal region. EEG also showed 3-7hz  theta-delta slowing in left hemisphere. Sharp waves were noted in left frontotemporal region. Sleep was characterized by vertex  waves, sleep spindles (12 to 14 Hz), maximal frontocentral region. Hyperventilation and photic stimulation were not performed.   EEG was disconnected between 12/09/2023 1306 to 1434 for testing.  ABNORMALITY -Sharp wave, left frontotemporal region - Continuous slow, generalized and maximal right fronto-temporal region  IMPRESSION: This study showed  evidence of epileptogenicity arising from left frontotemporal region. Additionally there is cortical dysfunction arising from right fronto-temporal region likely secondary to underlying structural abnormality. Lastly there is moderate diffuse encephalopathy. No seizures were noted.  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 EXAM: MRI HEAD WITH CONTRAST TECHNIQUE: Multiplanar, multiecho pulse sequences of the brain and surrounding structures were obtained with intravenous contrast. CONTRAST:  6.3mL GADAVIST  GADOBUTROL  1 MMOL/ML IV SOLN COMPARISON:  MR head without contrast 12/08/2023. CT head without contrast, CT angio head and neck and CT venogram 12/08/2023 FINDINGS: Diffuse cortical edema of the right hemisphere is again seen. Slight increased vascularity to the right hemisphere is noted, so-called intravascular enhancement. No parenchymal enhancement is present. Partial effacement of the sulci over the right convexity is again noted. Left hemisphere is unremarkable. No other pathologic enhancement is present. The brainstem and cerebellum are within normal limits. IMPRESSION: 1. Diffuse cortical edema and slight increased vascularity to the right hemisphere, so-called intravascular enhancement. No parenchymal enhancement is present. This is nonspecific and could be seen in a postictal state. Similar findings would be present in the setting of encephalitis. No focal enhancement is present to suggest a discrete infection. 2. No other pathologic enhancement. Electronically Signed   By: Audree Leas M.D.   On: 12/09/2023 14:27   CT CHEST ABDOMEN PELVIS W CONTRAST Result Date: 12/09/2023 CLINICAL DATA:  evaluate for occult malignancy given concern for autoimmune encephalopathy. Status epilepticus EXAM: CT CHEST, ABDOMEN, AND PELVIS WITH CONTRAST TECHNIQUE: Multidetector CT imaging of the chest, abdomen and pelvis was performed following  the standard protocol during bolus administration of intravenous contrast. RADIATION DOSE REDUCTION: This exam was performed according to the departmental dose-optimization program which includes automated exposure control, adjustment of the mA and/or kV according to patient size and/or use of iterative reconstruction technique. CONTRAST:  75mL OMNIPAQUE  IOHEXOL  350 MG/ML SOLN COMPARISON:  None Available. FINDINGS: CT CHEST FINDINGS Cardiovascular: Heart is normal size. Aorta is normal caliber. Small pericardial effusion. Mediastinum/Nodes: No mediastinal, hilar, or axillary adenopathy. Trachea and esophagus are unremarkable. Thyroid unremarkable. Endotracheal tube tip in the lower trachea. Lungs/Pleura: No confluent opacities or effusions. No suspicious pulmonary nodules. Musculoskeletal: Chest wall soft tissues are unremarkable. No acute bony abnormality. CT ABDOMEN PELVIS FINDINGS Hepatobiliary: No focal hepatic abnormality. Gallbladder unremarkable. Pancreas: No focal abnormality or ductal dilatation. Spleen: No focal abnormality.  Normal size. Adrenals/Urinary Tract: Small right renal cysts which require no additional follow-up imaging. No suspicious renal or adrenal abnormality. 2 mm nonobstructing stone in the lower pole of the left kidney. No hydronephrosis. Foley catheter in the bladder which is grossly unremarkable. Stomach/Bowel: Stomach, large and small bowel grossly unremarkable. Feeding tube tip in the duodenal bulb. Vascular/Lymphatic: Aortic atherosclerosis. No evidence of aneurysm or adenopathy. Reproductive: Small rim enhancing areas noted in the lower uterine segment, likely small fibroids. No adnexal mass. Other: No free fluid or free air. Musculoskeletal: No acute bony abnormality. Degenerative disc and facet disease in the lower lumbar spine at L4-5 with bilateral L4 pars defects and grade 1 anterolisthesis. IMPRESSION: No acute findings or evidence of occult malignancy in the chest, abdomen or  pelvis. Small  pericardial effusion. Abdominal aortic atherosclerosis. Feeding tube tip in the duodenal bulb. Electronically Signed   By: Janeece Mechanic M.D.   On: 12/09/2023 12:34   US  EKG SITE RITE Result Date: 12/09/2023 If Site Rite image not attached, placement could not be confirmed due to current cardiac rhythm.  MR BRAIN WO CONTRAST Result Date: 12/08/2023 CLINICAL DATA:  Altered mental status.  Seizure. EXAM: MRI HEAD WITHOUT CONTRAST TECHNIQUE: Multiplanar, multiecho pulse sequences of the brain and surrounding structures were obtained without intravenous contrast. COMPARISON:  11/30/2023 FINDINGS: Brain: Progression of holo hemispheric edema of the right cerebral hemisphere, which now includes most of the white matter. Decreased diffusion-weighted intensity within the left cerebellar hemisphere. Unchanged minimal leftward midline shift anteriorly. No hydrocephalus. There is no acute hemorrhage. The midline structures are normal. Vascular: Normal flow voids. Skull and upper cervical spine: Normal marrow signal. Sinuses/Orbits: Small amount of mastoid fluid. Normal orbits. Paranasal sinuses are clear. Other: None IMPRESSION: 1. Progression of holohemispheric edema of the right cerebral hemisphere, which now includes most of the white matter. The pattern remains consistent with ictal phenomenon. 2. Decreased diffusion-weighted imaging intensity within the left cerebellar hemisphere. Electronically Signed   By: Juanetta Nordmann M.D.   On: 12/08/2023 22:13   CT VENOGRAM HEAD Result Date: 12/08/2023 CLINICAL DATA:  Seizure EXAM: CT VENOGRAM HEAD TECHNIQUE: Venographic phase images of the brain were obtained following the administration of intravenous contrast. Multiplanar reformats and maximum intensity projections were generated. RADIATION DOSE REDUCTION: This exam was performed according to the departmental dose-optimization program which includes automated exposure control, adjustment of the mA and/or kV  according to patient size and/or use of iterative reconstruction technique. CONTRAST:  60mL OMNIPAQUE  IOHEXOL  350 MG/ML SOLN COMPARISON:  None Available. FINDINGS: Superior sagittal sinus: Normal. Straight sinus: Normal. Inferior sagittal sinus, vein of Galen and internal cerebral veins: Normal. 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 Normal left transverse sinus. Sigmoid sinuses: Normal. Visualized jugular veins: Normal. IMPRESSION: No evidence of dural venous sinus thrombosis. Electronically Signed   By: Juanetta Nordmann M.D.   On: 12/08/2023 22:03   CT ANGIO HEAD NECK W WO CM Result Date: 12/08/2023 CLINICAL DATA:  Seizures EXAM: CT ANGIOGRAPHY HEAD AND NECK WITH AND WITHOUT CONTRAST TECHNIQUE: Multidetector CT imaging of the head and neck was performed using the standard protocol during bolus administration of intravenous contrast. Multiplanar CT image reconstructions and MIPs were obtained to evaluate the vascular anatomy. Carotid stenosis measurements (when applicable) are obtained utilizing NASCET criteria, using the distal internal carotid diameter as the denominator. RADIATION DOSE REDUCTION: This exam was performed according to the departmental dose-optimization program which includes automated exposure control, adjustment of the mA and/or kV according to patient size and/or use of iterative reconstruction technique. CONTRAST:  60mL OMNIPAQUE  IOHEXOL  350 MG/ML SOLN COMPARISON:  Brain MRI 12/08/2023 and 11/30/2023 FINDINGS: CTA NECK FINDINGS Skeleton: No acute abnormality or high grade bony spinal canal stenosis. Other neck: Normal pharynx, larynx and major salivary glands. No cervical lymphadenopathy. Unremarkable thyroid gland. Upper chest: No pneumothorax or pleural effusion. No nodules or masses. Aortic arch: There is no calcific atherosclerosis of the aortic arch. Normal variant aortic arch branching pattern with the left vertebral artery arising  independently from the aortic arch. RIGHT carotid system: Normal without aneurysm, dissection or stenosis. LEFT carotid system: Normal without aneurysm, dissection or stenosis. Vertebral arteries: Codominant configuration. There is no dissection, occlusion or flow-limiting stenosis to the skull base (V1-V3 segments). CTA HEAD FINDINGS POSTERIOR  CIRCULATION: Minimal atherosclerotic calcification of the left V4 segment. No proximal occlusion of the anterior or inferior cerebellar arteries. Basilar artery is normal. Superior cerebellar arteries are normal. Posterior cerebral arteries are normal. ANTERIOR CIRCULATION: Intracranial internal carotid arteries are normal. Anterior cerebral arteries are normal. Middle cerebral arteries are normal. Venous sinuses: As permitted by contrast timing, patent. Anatomic variants: Fetal origin of the left posterior cerebral artery. Redemonstration of diffuse cytotoxic edema throughout the right cerebral hemisphere. Left cerebellar edema less clearly visualized. Review of the MIP images confirms the above findings. IMPRESSION: 1. No emergent large vessel occlusion or high-grade stenosis of the intracranial arteries. 2. Normal CTA of the neck. 3. Redemonstration of diffuse cytotoxic edema throughout the right cerebral hemisphere. Left cerebellar edema less clearly visualized. Electronically Signed   By: Juanetta Nordmann M.D.   On: 12/08/2023 22:02   CT HEAD WO CONTRAST ( ) Result Date: 12/08/2023 CLINICAL DATA:  Stroke suspected. Left-sided hemiplegia. Not following commands. EXAM: CT HEAD WITHOUT CONTRAST TECHNIQUE: Contiguous axial images were obtained from the base of the skull through the vertex without intravenous contrast. RADIATION DOSE REDUCTION: This exam was performed according to the departmental dose-optimization program which includes automated exposure control, adjustment of the mA and/or kV according to patient size and/or use of iterative reconstruction technique.  COMPARISON:  MRI of the head without contrast 11/30/2023. FINDINGS: Brain: Expected evolution of a large right hemispheric infarct is noted. Diffuse edema is present with effacement of the sulci and partial effacement the right lateral ventricle. 1-2 mm midline shift is present. Right caudate head infarct is present. No acute or focal left hemispheric infarcts are present. Left cerebellar infarct is noted.  No acute hemorrhage is present. Vascular: Minimal atherosclerotic changes are present at the cavernous internal carotid arteries bilaterally. No hyperdense vessel is present. Skull: Calvarium is intact. No focal lytic or blastic lesions are present. No significant extracranial soft tissue lesion is present. Sinuses/Orbits: The paranasal sinuses and mastoid air cells are clear. The globes and orbits are within normal limits. IMPRESSION: 1. Expected evolution of a large right hemispheric infarct. 2. Diffuse edema is present with effacement of the sulci and partial effacement the right lateral ventricle. 1-2 mm midline shift is present. 3. 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 5. No acute hemorrhage. Electronically Signed   By: Audree Leas M.D.   On: 12/08/2023 16:21   DG CHEST PORT 1 VIEW Result Date: 12/05/2023 CLINICAL DATA:  Intubated EXAM: PORTABLE CHEST 1 VIEW COMPARISON:  11/28/2023 FINDINGS: Single frontal view of the chest demonstrates endotracheal tube overlying tracheal air column, tip 1.5 cm above carina. Enteric catheter passes below diaphragm, tip excluded by collimation. Cardiac silhouette is stable. No acute airspace disease, effusion, or pneumothorax. Moderate gaseous distention of the stomach. No acute bony abnormalities. IMPRESSION: 1. Support devices as above. 2. No acute intrathoracic process. Electronically Signed   By: Bobbye Burrow M.D.   On: 12/05/2023 20:12    Microbiology: Results for orders placed or performed during the hospital encounter of 11/27/23   Culture, blood (routine x 2)     Status: None   Collection Time: 11/27/23  8:58 PM   Specimen: BLOOD RIGHT FOREARM  Result Value Ref Range Status   Specimen Description BLOOD RIGHT FOREARM  Final   Special Requests   Final    BOTTLES DRAWN AEROBIC AND ANAEROBIC Blood Culture adequate volume   Culture   Final    NO GROWTH 5 DAYS Performed at [facility],  [address], [place], [place] [address]    Report Status 12/02/2023 FINAL  Final  Culture, blood (routine x 2)     Status: None   Collection Time: 11/27/23  8:58 PM   Specimen: BLOOD  Result Value Ref Range Status   Specimen Description BLOOD RIGHT ANTECUBITAL  Final   Special Requests   Final    BOTTLES DRAWN AEROBIC AND ANAEROBIC Blood Culture adequate volume   Culture   Final    NO GROWTH 5 DAYS Performed at [facility], [address], [place], [place] [address]    Report Status 12/02/2023 FINAL  Final  Resp panel by RT-PCR (RSV, Flu A&B, Covid) Peripheral     Status: None   Collection Time: 11/27/23  8:58 PM   Specimen: Peripheral; Nasal Swab  Result Value Ref Range Status   SARS Coronavirus 2 by RT PCR NEGATIVE NEGATIVE Final   Influenza A by PCR NEGATIVE NEGATIVE Final   Influenza B by PCR NEGATIVE NEGATIVE Final    Comment: (NOTE) The Xpert Xpress SARS-CoV-2/FLU/RSV plus assay is intended as an aid in the diagnosis of influenza from Nasopharyngeal swab specimens and should not be used as a sole basis for treatment. Nasal washings and aspirates are unacceptable for Xpert Xpress SARS-CoV-2/FLU/RSV testing.  Fact Sheet for Patients: BloggerCourse.com  Fact Sheet for Healthcare Providers: SeriousBroker.it  This test is not yet approved or cleared by the [place]  FDA and has been authorized for detection and/or diagnosis of SARS-CoV-2 by FDA under an Emergency Use Authorization (EUA). This EUA will remain in effect (meaning this test can be used)  for the duration of the COVID-19 declaration under Section 564(b)(1) of the Act, 21 U.S.C. section 360bbb-3(b)(1), unless the authorization is terminated or revoked.     Resp Syncytial Virus by PCR NEGATIVE NEGATIVE Final    Comment: (NOTE) Fact Sheet for Patients: BloggerCourse.com  Fact Sheet for Healthcare Providers: SeriousBroker.it  This test is not yet approved or cleared by the [place]  FDA and has been authorized for detection and/or diagnosis of SARS-CoV-2 by FDA under an Emergency Use Authorization (EUA). This EUA will remain in effect (meaning this test can be used) for the duration of the COVID-19 declaration under Section 564(b)(1) of the Act, 21 U.S.C. section 360bbb-3(b)(1), unless the authorization is terminated or revoked.  Performed at [facility], [address] [place], [place] [address]   MRSA Next Gen by PCR, Nasal     Status: None   Collection Time: 11/28/23  1:58 AM   Specimen: Nasal Mucosa; Nasal Swab  Result Value Ref Range Status   MRSA by PCR Next Gen NOT DETECTED NOT DETECTED Final    Comment: (NOTE) The GeneXpert MRSA Assay (FDA approved for NASAL specimens only), is one component of a comprehensive MRSA colonization surveillance program. It is not intended to diagnose MRSA infection nor to guide or monitor treatment for MRSA infections. Test performance is not FDA approved in patients less than 58 years old. Performed at [facility], [address], [place], [place] [address]   CSF culture w Gram Stain     Status: None   Collection Time: 11/28/23  2:30 PM   Specimen: CSF; Cerebrospinal Fluid  Result Value Ref Range Status   Specimen Description CSF  Final   Special Requests NONE  Final   Gram Stain NO WBC SEEN NO ORGANISMS SEEN CYTOSPIN SMEAR   Final   Culture   Final  NO GROWTH 3 DAYS Performed at [facility], [address] [place], [place] [address]     Report Status 12/01/2023 FINAL  Final  Culture, Respiratory w Gram Stain     Status: None   Collection Time: 12/01/23 12:33 PM   Specimen: Tracheal Aspirate; Respiratory  Result Value Ref Range Status   Specimen Description TRACHEAL ASPIRATE  Final   Special Requests NONE  Final   Gram Stain   Final    MODERATE WBC PRESENT, PREDOMINANTLY PMN FEW SQUAMOUS EPITHELIAL CELLS PRESENT FEW YEAST FEW GRAM POSITIVE COCCI RARE GRAM NEGATIVE RODS    Culture   Final    FEW Normal respiratory flora-no Staph aureus or Pseudomonas seen Performed at [facility], [address] [place], [place] [address]    Report Status 12/04/2023 FINAL  Final  Culture, Respiratory w Gram Stain     Status: None   Collection Time: 12/03/23 10:44 AM   Specimen: Tracheal Aspirate; Respiratory  Result Value Ref Range Status   Specimen Description TRACHEAL ASPIRATE  Final   Special Requests NONE  Final   Gram Stain   Final    ABUNDANT WBC PRESENT, PREDOMINANTLY PMN RARE GRAM POSITIVE COCCI IN PAIRS RARE YEAST WITH PSEUDOHYPHAE RARE GRAM NEGATIVE RODS    Culture   Final    RARE Consistent with normal respiratory flora. Performed at [facility], [address], [place], [place] [address]    Report Status 12/05/2023 FINAL  Final    Labs: CBC: Recent Labs  Lab 12/28/23 0645 12/29/23 1035 12/31/23 1041  WBC 14.8* 14.1* 7.8  HGB 8.6* 8.6* 8.2*  HCT 27.3* 26.6* 25.8*  MCV 91.3 91.7 89.6  PLT 115* 126* 137*   Basic Metabolic Panel: Recent Labs  Lab 12/28/23 0645 12/29/23 1035 12/30/23 0740 12/31/23 1041  NA 138 137 138 138  K 3.8 3.9 4.0 3.5  CL 110 107 110 108  CO2 16* 20* 16* 19*  GLUCOSE 104* 136* 81 111*  BUN 17 23* 22* 22*  CREATININE 0.59 0.65 0.68 0.72  CALCIUM  8.7* 9.2 9.2 8.8*  PHOS 2.5 3.5 4.6  --    Liver Function Tests: Recent Labs  Lab 12/28/23 0645 12/29/23 1035 12/30/23 0740 12/31/23 1041  AST  --   --   --  48*  ALT  --   --   --  31  ALKPHOS  --   --    --  76  BILITOT  --   --   --  0.6  PROT  --   --   --  5.4*  ALBUMIN  2.8* 2.8* 2.8* 2.7*   CBG: Recent Labs  Lab 01/01/24 2002 01/01/24 2354 01/02/24 0521 01/02/24 0743 01/02/24 1107  GLUCAP 128* 108* 123* 141* 146*    Discharge time spent: greater than 30 minutes.  Signed: Bertram Brocks, MD [facility] 01/02/2024

## 2024-01-02 NOTE — Progress Notes (Signed)
 Speech Language Pathology Treatment: Dysphagia;Cognitive-Linquistic  Patient Details Name: Meredith Park MRN: [id number] DOB: [birth year] Today's Date: 01/02/2024 Time: 1340-1401 SLP Time Calculation (min) (ACUTE ONLY): 21 min  Assessment / Plan / Recommendation Clinical Impression  Pt was seen for dysphagia therapy after yesterday's MBS.  She was repositioned to be upright; had just finished bath.  D/C to SNF is pending.  Presented with significant right gaze preference; responded to cues to look at clinician and answered basic questions with aphonic yes/no.  She held a cup of thin apple juice in right hand and took small sips.  Needed physical assist for pacing.  Initial few sips led to coughing, but after practice and forward feedback, she tolerated thin liquids with no further s/s of aspiration. She fed herself pudding, also needing physical assist to take breaks after each bite, otherwise she would continue to put more food in her mouth without swallowing.  When helped with pacing and minimizing size of boluses, she demonstrated functional swallowing with no concerns for aspiration.   Pt will benefit from trials of puree/thin liquids with SLP at [facility].  Her diet can be advanced based on clinical presentation, without repeating MBS.    HPI HPI: 53 yo female presenting s/p seizure with vomiting. Admitted with acute encephalopathy, acute respiratory failure, septic shock secondary to aspiration PNA. ETT 3/25-4/2; reintubated for stridor and secretions 4/2-4/7. MRI 4/6: diffuse cortical edema and slight increased vascularity to the  right hemisphere, possibly postictal. PMH: HTN, HLD, ETOH abuse hx, seizure. Previous SLUMS 2023: 26/30 (errors primarily with delayed recall, mild word-finding).      SLP Plan  Continue with current plan of care      Recommendations for follow up therapy are one component of a multi-disciplinary discharge planning process, led by the attending  physician.  Recommendations may be updated based on patient status, additional functional criteria and insurance authorization.    Recommendations  Diet recommendations: NPO                              Continue with current plan of care   Meredith Ascencio L. Beatris Lincoln, MA CCC/SLP Clinical Specialist - Acute Care SLP Acute Rehabilitation Services Office number [phone]   Meredith Park  01/02/2024, 2:05 PM

## 2024-01-02 NOTE — TOC Transition Note (Signed)
 Transition of Care [facility]) - Discharge Note   Patient Details  Name: Meredith Park MRN: [id number] Date of Birth: [birth year]  Transition of Care [facility] CM/SW Contact:  Emillie Chasen Isaias March, Student-Social Work Phone Number: 01/02/2024, 2:15 PM   Clinical Narrative:   MSW Student received insurance approval for patient to admit to SNF. MSW Student confirmed with MD that patient is stable for discharge. MSW Student and CSW notified mother [name] and they are in agreement with discharge. MSW Student confirmed bed is available at [facility]. Transportation arranged with [facility] for next available.   Number to call report: [phone], room 114     Final next level of care: Skilled Nursing Facility Barriers to Discharge: Barriers Resolved   Patient Goals and CMS Choice Patient states their goals for this hospitalization and ongoing recovery are:: Rehab CMS Medicare.gov Compare Post Acute Care list provided to:: Patient Represenative (must comment) Choice offered to / list presented to : Parent Elliott ownership interest in [facility].provided to:: Parent NA    Discharge Placement              Patient chooses bed at: [facility] Patient to be transferred to facility by: [facility] Name of family member notified: [name] Patient and family notified of of transfer: 01/02/24  Discharge Plan and Services Additional resources added to the After Visit Summary for   In-house Referral: Clinical Social Work   Post Acute Care Choice: Skilled Nursing Facility                               Social Drivers of Health (SDOH) Interventions SDOH Screenings   Food Insecurity: Low Risk  (07/18/2023)   Received from [facility]  Housing: Low Risk  (07/18/2023)   Received from [facility]  Transportation Needs: No Transportation Needs (07/18/2023)   Received from [facility]  Utilities: Low Risk  (07/18/2023)   Received from [facility]  Tobacco Use: Low  Risk  (12/25/2023)  Recent Concern: Tobacco Use - Medium Risk (11/05/2023)   Received from [facility]     Readmission Risk Interventions    12/12/2023    3:50 PM  Readmission Risk Prevention Plan  Transportation Screening Complete  Medication Review (RN Care Manager) Complete  PCP or Specialist appointment within 3-5 days of discharge Complete  HRI or Home Care Consult Complete  SW Recovery Care/Counseling Consult Complete  Palliative Care Screening Not Applicable  Skilled Nursing Facility Complete

## 2024-01-02 NOTE — Progress Notes (Signed)
 Discharge information provided by [facility] Nurse.

## 2024-01-31 ENCOUNTER — Telehealth: Payer: Self-pay | Admitting: Neurology

## 2024-01-31 ENCOUNTER — Institutional Professional Consult (permissible substitution): Admitting: Neurology

## 2024-01-31 NOTE — Telephone Encounter (Signed)
 Called and spoke to pts mother and confirmed appt for tomorrow at 8am

## 2024-01-31 NOTE — Telephone Encounter (Signed)
 When or if pt calls back let them know they've been scheduled for 8am tomorrow as that was the only opening

## 2024-01-31 NOTE — Telephone Encounter (Signed)
 Have them come tomorrow at 8AM.

## 2024-01-31 NOTE — Telephone Encounter (Signed)
 Pt mother and sister came into office for appt before transport dropped pt off. Pt sister flew in from [place]  to be here for appt. Transportation dropped pt off over 30 minutes late because they forgot about the appt. Pt mother would appreciate being worked in or any appt as soon as possible, pt sister leaving town on Monday 02/04/2024.

## 2024-02-01 ENCOUNTER — Encounter: Payer: Self-pay | Admitting: Neurology

## 2024-02-01 ENCOUNTER — Ambulatory Visit: Admitting: Neurology

## 2024-02-01 ENCOUNTER — Ambulatory Visit (INDEPENDENT_AMBULATORY_CARE_PROVIDER_SITE_OTHER): Admitting: Neurology

## 2024-02-01 VITALS — BP 107/74 | HR 77 | Ht 62.0 in

## 2024-02-01 DIAGNOSIS — H5340 Unspecified visual field defects: Secondary | ICD-10-CM

## 2024-02-01 DIAGNOSIS — G40001 Localization-related (focal) (partial) idiopathic epilepsy and epileptic syndromes with seizures of localized onset, not intractable, with status epilepticus: Secondary | ICD-10-CM | POA: Diagnosis not present

## 2024-02-01 DIAGNOSIS — G8324 Monoplegia of upper limb affecting left nondominant side: Secondary | ICD-10-CM

## 2024-02-01 DIAGNOSIS — G8194 Hemiplegia, unspecified affecting left nondominant side: Secondary | ICD-10-CM

## 2024-02-01 DIAGNOSIS — G40901 Epilepsy, unspecified, not intractable, with status epilepticus: Secondary | ICD-10-CM

## 2024-02-01 DIAGNOSIS — G8314 Monoplegia of lower limb affecting left nondominant side: Secondary | ICD-10-CM

## 2024-02-01 MED ORDER — VALTOCO 20 MG DOSE 2 X 10 MG/0.1ML NA LQPK: 20.0000 mg | NASAL | 1 refills | Status: AC | PRN

## 2024-02-01 MED ORDER — TOPIRAMATE 100 MG PO TABS: 100.0000 mg | ORAL_TABLET | Freq: Two times a day (BID) | ORAL | 3 refills | Status: AC

## 2024-02-01 NOTE — Progress Notes (Addendum)
 [facility]  PATIENT: Meredith Park DOB: [birth year]  REQUESTING CLINICIAN: [place], [place]  E, * HISTORY FROM: Patient and mother  REASON FOR VISIT: Status epilepticus follow up    HISTORICAL  CHIEF COMPLAINT:  Chief Complaint  Patient presents with   Follow-up    RM 12 with family. Last seen 04/10/22. Seizure f/u. Had severe seizure 11/27/23. Having more memory issues.   INTERVAL HISTORY 02/01/2024:  Discussed the use of AI scribe software for clinical note transcription with the patient, who gave verbal consent to proceed.  History of Present Illness   Meredith Park is a 53 year old female with epilepsy whom I have not seen in the past 2 years. She is presenting today with her mother and sister following hospitalization for status epilepticus. Following that admission she has subsequent neurological deficits including left hemiplegia, visual field cut and cognitive deficits.  At our last visit, she was on Lacosamide  with plan to titrate up depending on seizure frequency. She was seen at [facility] and her medication was changed to Lamotrigine  250 mg twice daily and Topiramate  100 mg nightly. Family reports that she was seizure-free for about six months after adjusting her medication dosage, which allowed her to resume driving. However, on March 25th, she experienced a prolonged seizure lasting ?between one to five hours per mother, leading to hospitalization. During her hospitalization, she was placed on a ventilator for two weeks and experienced complications including brain swelling and septic shock. She was eventually discharged after a month but has been left with significant neurological deficits. She has a peg tube.  She is currently experiencing left-sided paralysis and cognitive impairments, including memory issues and disorientation to time and place. She often believes her children are still young and has difficulty recognizing the current date and  year. There are also reports of hallucinations described as having 4 to 5 other roommates when in fact, she only has one. She has been undergoing rehabilitation, including physical, occupational, and speech therapy, to address her paralysis and swallowing difficulties. She has started eating pureed meals and thickened liquids. She has also been experiencing pain in her left arm, which she describes as 'hurting a lot'.  Mother thinks that patient might have missed her Topiramate  (ran out) prior to her seizure in March. She resumed the full dose without titration, which may have contributed to the seizure. Since discharge from the hospital, they have not reported any other seizures.       INTERVAL HISTORY 04/10/2022:  Meredith Park presents today for follow-up, she is accompanied by her daughter who provides some history.  Since last visit she continues to have seizures.  She reports having small seizures where she will stare, have speech arrest and others where she will have generalized convulsion.  She reports that her last generalized convulsion was on July 1.  Patient reports remembering sitting down, had odd smell, then feeling hot then she remembers waking up from the floor with people around her.  She did have urinary incontinence and tongue biting.  She reports being under a lot of stress surrounding the date of the seizure.  She is currently on Vimpat  150 mg, denies any side effect of the medication report compliance.  She has noted her last few generalized convulsion occurred around the first of the month.    INTERVAL HISTORY 10/10/2021:  Patient presents for follow-up, last visit was in December, at that time plan was to get an EEG and brain MRI. EEG completed and showed  right temporal sharp and intermittent right temporal slowing. MRI brain with no acute finding.  Patient presented to the emergency department on January 26, at that time she was reporting multiple seizure-like episode, reportedly staring,  she stated that she could not understand, could not state her name, could not even remember her mother's name. Episode would last about 10 minutes.  She was admitted to the hospital, had a long-term monitoring for total of 5 days and no seizures were captured, and there was no evidence of epileptiform discharges.  Patient was discharged home with slight increase of Vimpat  150 mg in the morning and 100 at night with plan to follow-up with me. She reports currently she is under a lot of stress due to her son.  Her son has a diagnosis of schizophrenia and he is abusing street drugs, currently he is living in the street.  Her father was able to get him a apartment next to her house and this is again a big source of stress per patient.   HISTORY OF PRESENT ILLNESS:  This is a 53 year old woman with PMHx Seizure in the setting of alcohol withdrawal, hypertension, hyperlipidemia, alcohol abuse in remission who is presenting with complaint of seizures.  Patient said that 10 days ago he was riding in her car, started to get warm felt like she was responding but family told her the only thing she was saying was okay, okay, okay,.  She was able to pull the car on the side of the road and get out of the car.  Patient reported once she get out of the car, the next thing that she remembers is waking up 40 minutes later in the ambulance, she did not remember the event.  Mother was present and witnessed patient coming out of the car, she was staring, felt like she was going to fall down and she help her to the ground, no abnormal movements noted but patient was unresponsive for about 5 minutes, EMS was called.  Mother also has reported episode of staring, where she will stare, be unresponsive lasting seconds and she will go back to her normal self.  No episode of abnormal movement, no convulsion.  The night before the event patient reported taking her son's Adderall.  Patient stated that she is going through menopause and  thinks some of these hot flashes are related to menopause. She reported in the end in the month of June she was admitted for altered mental status, noted that her ammonia level was elevated and was told that it was secondary to the losartan  which was discontinued. She reported history of alcohol abuse states that she has been sober for the past 2-1/2 years.  However she does take marijuana cookies at night. Patient also reports loud snoring, daytime fatigue.  She states she has a smart watch that has been telling her sleep quality is poor, that she has apnea, up to 5 times per hour. She has never been evaluated for sleep apnea.   Handedness: right handed   Seizure Type: Staring type, unresponsive, generalized convulsion   Current frequency: Last episode in march when she was admitted for status epilepticus   Any injuries from seizures: None  Seizure risk factors: Alcohol abuse, No family history of seizure  Previous ASMs: None   [place]ren[place] ASMs: None   ASMs side effects: Not applicable  Brain Images: Head CT November 2022 which was normal  Previous EEGs: Not done   OTHER MEDICAL CONDITIONS: Alcohol abuse in  remission, HTN, HLD, sleep apnea  REVIEW OF SYSTEMS: Full 14 system review of systems performed and negative with exception of: as noted in the HPI   ALLERGIES: Allergies  Allergen Reactions   Meperidine Itching   Hydrochlorothiazide Other (See Comments)    Raises ammonia level   Keppra  [Levetiracetam ] Other (See Comments)    Behavioral, suicidal ideation     HOME MEDICATIONS: Outpatient Medications Prior to Visit  Medication Sig Dispense Refill   aspirin  81 MG chewable tablet Place 81 mg into feeding tube daily.     atorvastatin  (LIPITOR) 40 MG tablet Place 1 tablet (40 mg total) into feeding tube daily.     carvedilol  (COREG ) 3.125 MG tablet Place 1 tablet (3.125 mg total) into feeding tube 2 (two) times daily with a meal.     docusate (COLACE) 50 MG/5ML liquid Take  10 mLs (100 mg total) by mouth 2 (two) times daily as needed for mild constipation.     hydrocortisone  cream 1 % Apply topically as needed for itching. Apply to Right side of Neck     insulin  aspart (NOVOLOG ) 100 UNIT/ML injection Inject 0-9 Units into the skin every 4 (four) hours. Sliding scale CBG 70 - 120: 0 units CBG 121 - 150: 1 unit,  CBG 151 - 200: 2 units,  CBG 201 - 250: 3 units,  CBG 251 - 300: 5 units,  CBG 301 - 350: 7 units,  CBG 351 - 400: 9 units   CBG > 400: 9 units and notify your MD     ipratropium-albuterol  (DUONEB) 0.5-2.5 (3) MG/3ML SOLN Take 3 mLs by nebulization every 6 (six) hours as needed.     lamoTRIgine  (LAMICTAL ) 200 MG tablet Take 100 mg by mouth 2 (two) times daily. Take medication with two (25mg ) tablet by mouth twice daily for a total of 250mg .     lamoTRIgine  (LAMICTAL ) 25 MG tablet Take 50 mg by mouth 2 (two) times daily. Take with one (200mg ) tablet by mouth twice daily for a dose total of 250mg .     Multiple Vitamin (MULTIVITAMIN WITH MINERALS) TABS tablet Place 1 tablet into feeding tube daily.     Nutritional Supplements (FEEDING SUPPLEMENT, JEVITY 1.5 CAL/FIBER,) LIQD Place 1,000 mLs into feeding tube continuous.     nystatin  cream (MYCOSTATIN ) Apply topically 2 (two) times daily.     pantoprazole  sodium (PROTONIX ) 40 mg Place 40 mg into feeding tube 2 (two) times daily.     polyethylene glycol (MIRALAX  / GLYCOLAX ) 17 g packet Place 17 g into feeding tube daily as needed for moderate constipation.     Protein (FEEDING SUPPLEMENT, PROSOURCE TF20,) liquid Place 60 mLs into feeding tube daily.     QUEtiapine  (SEROQUEL ) 50 MG tablet Place 1 tablet (50 mg total) into feeding tube at bedtime. 10 tablet 0   sertraline  (ZOLOFT ) 25 MG tablet Place 1 tablet (25 mg total) into feeding tube at bedtime. (Patient taking differently: Place 50 mg into feeding tube at bedtime.)     Water  For Irrigation, Sterile (FREE WATER ) SOLN Place 200 mLs into feeding tube every 4 (four)  hours.     Zinc Oxide (TRIPLE PASTE) 12.8 % ointment Apply 1 Application topically in the morning and at bedtime.     topiramate  (TOPAMAX ) 100 MG tablet Place 1 tablet (100 mg total) into feeding tube at bedtime.     sodium bicarbonate  650 MG tablet Place 1 tablet (650 mg total) into feeding tube 3 (three) times daily. (Patient not taking:  Reported on 02/01/2024)     No facility-administered medications prior to visit.    PAST MEDICAL HISTORY: Past Medical History:  Diagnosis Date   Alcohol abuse    Alcohol withdrawal (HCC) 08/28/2018   High cholesterol    Hypertension    Seizures (HCC)     PAST SURGICAL HISTORY: Past Surgical History:  Procedure Laterality Date   IR GASTROSTOMY TUBE MOD SED  12/24/2023   PEG PLACEMENT N/A 12/25/2023   Procedure: INSERTION, PEG TUBE;  Surgeon: Anda Bamberg, MD;  Location: [facility] OR;  Service: General;  Laterality: N/A;  PERCUTANEOUS ENDOSCOPIC GASTROSTOMY TUBE PLACEMENT    FAMILY HISTORY: Family History  Problem Relation Age of Onset   Sleep apnea Neg Hx     SOCIAL HISTORY: Social History   Socioeconomic History   Marital status: Divorced    Spouse name: Not on file   Number of children: Not on file   Years of education: Not on file   Highest education level: Not on file  Occupational History   Not on file  Tobacco Use   Smoking status: Never   Smokeless tobacco: Never  Vaping Use   Vaping status: Some Days  Substance and Sexual Activity   Alcohol use: Yes    Comment: daily, 1/2 gallon a day x 3 days after relapse, 4 years sober prior to that   Drug use: Not Currently    Types: Marijuana   Sexual activity: Not on file  Other Topics Concern   Not on file  Social History Narrative   Not on file   Social Drivers of Health   Financial Resource Strain: Not on file  Food Insecurity: Low Risk  (07/18/2023)   Received from [facility]   Hunger Vital Sign    Worried About Running Out of Food in the Last Year: Never true    Ran  Out of Food in the Last Year: Never true  Transportation Needs: No Transportation Needs (07/18/2023)   Received from [facility]    In the past 12 months, has lack of reliable transportation kept you from medical appointments, meetings, work or from getting things needed for daily living? : No  Physical Activity: Not on file  Stress: Not on file  Social Connections: Not on file  Intimate Partner Violence: Not on file    PHYSICAL EXAM  GENERAL EXAM/CONSTITUTIONAL: Vitals:  Vitals:   02/01/24 0752  BP: 107/74  Pulse: 77  SpO2: 97%  Height: 5' 2 (1.575 m)      Body mass index is 25.28 kg/m. Wt Readings from Last 3 Encounters:  01/02/24 138 lb 3.7 oz (62.7 kg)  11/23/22 233 lb 7.5 oz (105.9 kg)  11/22/22 233 lb 7.5 oz (105.9 kg)   Patient is in no distress; well developed, nourished and groomed; neck is supple  MUSCULOSKELETAL: Gait, strength, tone, movements noted in Neurologic exam below  NEUROLOGIC: MENTAL STATUS:      No data to display         awake, alert, laying on the gurney oriented to person, but not place or time Difficulty with recent memory  Difficulty with attention and concentration language fluent, comprehension intact, naming intact  CRANIAL NERVE:  2nd, 3rd, 4th, 6th - pupils equal and reactive to light, There is a left visual fields cut, extraocular muscles intact, no nystagmus 5th - facial sensation symmetric 7th - left facial droop  8th - hearing intact 9th - palate elevates symmetrically, uvula midline 11th -  shoulder shrug symmetric 12th - tongue protrusion midline  MOTOR:  normal bulk and tone, full strength in the RUE, and RLE, she has left hemiplegia   SENSORY:  Decrease sensation to light touch on the left. She has extinction on double stimulation   COORDINATION:  finger-nose-finger, fine finger movements normal on the right   GAIT/STATION:  Deferred.    DIAGNOSTIC DATA (LABS, IMAGING, TESTING) - I  reviewed patient records, labs, notes, testing and imaging myself where available.  Lab Results  Component Value Date   WBC 7.8 12/31/2023   HGB 8.2 (L) 12/31/2023   HCT 25.8 (L) 12/31/2023   MCV 89.6 12/31/2023   PLT 137 (L) 12/31/2023      Component Value Date/Time   NA 138 12/31/2023 1041   NA 144 10/10/2021 1300   K 3.5 12/31/2023 1041   CL 108 12/31/2023 1041   CO2 19 (L) 12/31/2023 1041   GLUCOSE 111 (H) 12/31/2023 1041   BUN 22 (H) 12/31/2023 1041   BUN 12 10/10/2021 1300   CREATININE 0.72 12/31/2023 1041   CALCIUM  8.8 (L) 12/31/2023 1041   PROT 5.4 (L) 12/31/2023 1041   ALBUMIN  2.7 (L) 12/31/2023 1041   AST 48 (H) 12/31/2023 1041   ALT 31 12/31/2023 1041   ALKPHOS 76 12/31/2023 1041   BILITOT 0.6 12/31/2023 1041   GFRNONAA >60 12/31/2023 1041   GFRAA >60 11/10/2018 1016   Lab Results  Component Value Date   CHOL 165 12/09/2023   HDL 37 (L) 12/09/2023   LDLCALC 94 12/09/2023   TRIG 171 (H) 12/09/2023   Lab Results  Component Value Date   HGBA1C 5.0 11/29/2023   Lab Results  Component Value Date   VITAMINB12 1,348 (H) 10/03/2021   Lab Results  Component Value Date   TSH 2.521 11/28/2023    Head CT 07/12/2021 No acute intracranial abnormality.  MRI Brain 09/14/2021 MRI scan of the brain with and without contrast showing only mild nonspecific changes of chronic small vessel disease. Incidental calcified small pineal region cyst is noted  MRI Brain 12/20/2023 1. Evolutionary changes throughout the right cerebral and left cerebellar hemispheres as described above most compatible with evolving injury from recent status epilepticus. T2 hyperintensity throughout the right cerebral hemispheric white matter and basal ganglia has increased, however brain swelling has decreased with resolved mass effect. New cortical laminar necrosis. 2. Recent punctate infarcts in the left temporal subcortical white matter and superior right cerebellum with the latter being  new.  MRI Brain 12/09/2023 1. Diffuse cortical edema and slight increased vascularity to the right hemisphere, so-called intravascular enhancement. No parenchymal enhancement is present. This is nonspecific and could be seen in a postictal state. Similar findings would be present in the setting of encephalitis. No focal enhancement is present to suggest a discrete infection. 2. No other pathologic enhancement  MRI Brain 11/30/2023 1. Extensive cortical restricted diffusion in the right cerebral hemisphere and medial right thalamus with contralateral involvement of the cerebellum, pattern compatible with seizure phenomenon in this patient in recent status. 2. Small foci of restricted diffusion in the left temporal white matter more suggestive of white matter infarcts. There is chronic white matter disease.  Routine EEG 08/23/2021 Right temporal sharps  Intermittent right temporal slowing   LTM EEG 09/30/2021 This study is within normal limits. No seizures or epileptiform discharges were seen throughout the recording.  LTM 11/28/2023 -Sharp waves, right hemisphere -Continuous slow, generalized and lateralized right hemisphere   LTM 12/10/2023 -Sharp wave, left  frontotemporal region -Continuous slow, generalized and maximal right fronto-temporal region   LTM 12/15/2023 -Sharp wave, left frontotemporal region -Continuous slow, generalized and maximal right fronto-temporal region    I personally reviewed brain Images and EEG.   ASSESSMENT AND PLAN  53 y.o. year old female  with past medical history of alcohol abuse in remission, hypertension, hyperlipidemia and epilepsy. Recently admitted for status epilepticus.      Assessment and Plan    Epilepsy with status epilepticus   Recurrent seizures led to a significant event on March 25, requiring hospitalization and ventilator support, likely due to missed topiramate  doses. She is at high risk for future seizures without medication adherence.  EEG indicates right-sided discharge as the seizure origin as well as left frontotemporal epileptiform discharges. Seizures are life-threatening if they progress to status epilepticus. Increase topiramate  to 100 mg twice daily. We will write a letter of support for skilled nursing care and rehabilitation. Provide a new prescription for nasal spray for seizure management, with instructions to use both sprays during a seizure.  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 Recovery potential is uncertain, but therapy may offer some improvement. Extensive damage affects her ability to walk, see and use her left side. Order an updated MRI to assess brain damage extent. Continue physical, occupational, and speech therapy. Discuss with physical therapy about using braces to extend fingers.  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 Some improvement is possible with therapy, but significant deficits remain, affecting her perception of reality. Continue cognitive rehabilitation as part of skilled nursing care.  Dysphagia following brain injury   She has difficulty swallowing, requiring a puree diet and thickened liquids due to the broader neurological impact from brain injury. Continue speech therapy focusing on swallowing.  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 Evaluate for ongoing brain inflammation with MRI. Consider psychiatric evaluation if symptoms  persist.     1. Partial idiopathic epilepsy with seizures of localized onset, not intractable, with status epilepticus (HCC)   2. Status epilepticus (HCC)   3. Left hemiplegia (HCC)   4. Visual field cut      Patient Instructions  Increase topiramate  to 100 mg twice daily Continue lamotrigine  250 mg twice daily Continue with aggressive physical, occupational and speech therapy MRI Brain without contrast to evaluate for extend of damages Will give a letter of support for physical therapy and disability Valtoco  as rescue medication Please contact us  if you do have a another seizure Return in 3 months or sooner if worse.   Per [place] statutes, patients with seizures are not allowed to drive until they have been seizure-free for six months.  Other recommendations include using caution when using heavy equipment or power tools. Avoid working on ladders or at heights. Take showers instead of baths.  Do not swim alone.  Ensure the water  temperature is not too high on the home water  heater. Do not go swimming alone. Do not lock yourself in a room alone (i.e. bathroom). When caring for  infants or small children, sit down when holding, feeding, or changing them to minimize risk of injury to the child in the event you have a seizure. Maintain good sleep hygiene. Avoid alcohol.  Also recommend adequate sleep, hydration, good diet and minimize stress.   During the Seizure  - First, ensure adequate ventilation and place patients on the floor on their left side  Loosen clothing around the neck and ensure the airway is patent. If the patient is clenching the teeth, do not force the mouth open with any object as this can cause severe damage - Remove all items from the surrounding that can be hazardous. The patient may be oblivious to what's happening and may not even know what he or she is doing. If the patient is confused and wandering, either gently guide him/her away and block access to  outside areas - Reassure the individual and be comforting - Call 911. In most cases, the seizure ends before EMS arrives. However, there are cases when seizures may last over 3 to 5 minutes. Or the individual may have developed breathing difficulties or severe injuries. If a pregnant patient or a person with diabetes develops a seizure, it is prudent to call an ambulance. - Finally, if the patient does not regain full consciousness, then call EMS. Most patients will remain confused for about 45 to 90 minutes after a seizure, so you must use judgment in calling for help. - Avoid restraints but make sure the patient is in a bed with padded side rails - Place the individual in a lateral position with the neck slightly flexed; this will help the saliva drain from the mouth and prevent the tongue from falling backward - Remove all nearby furniture and other hazards from the area - Provide verbal assurance as the individual is regaining consciousness - Provide the patient with privacy if possible - Call for help and start treatment as ordered by the caregiver   After the Seizure (Postictal Stage)  After a seizure, most patients experience confusion, fatigue, muscle pain and/or a headache. Thus, one should permit the individual to sleep. For the next few days, reassurance is essential. Being calm and helping reorient the person is also of importance.  Most seizures are painless and end spontaneously. Seizures are not harmful to others but can lead to complications such as stress on the lungs, brain and the heart. Individuals with prior lung problems may develop labored breathing and respiratory distress.     Orders Placed This Encounter  Procedures   MR BRAIN WO CONTRAST     Meds ordered this encounter  Medications   diazePAM , 20 MG Dose, (VALTOCO  20 MG DOSE) 2 x 10 MG/0.1ML LQPK    Sig: Place 20 mg into the nose as needed (for seizure lasting more than 2 minutes).    Dispense:  5 each     Refill:  1    Please provide 5 boxes for a total of 10 doses.   topiramate  (TOPAMAX ) 100 MG tablet    Sig: Place 1 tablet (100 mg total) into feeding tube 2 (two) times daily.    Dispense:  180 tablet    Refill:  3    Return in about 13 weeks (around 05/02/2024).  The patient's condition requires frequent monitoring and adjustments in the treatment plan, reflecting the ongoing complexity of care.  This provider is the continuing focal point for all needed services for this condition.   Meredith Cleveland, MD 02/01/2024, 1:11 PM  [facility] [address], [address] [place], [place] [address] [phone]

## 2024-02-01 NOTE — Patient Instructions (Addendum)
 Increase topiramate  to 100 mg twice daily Continue lamotrigine  250 mg twice daily Continue with aggressive physical, occupational and speech therapy MRI Brain without contrast to evaluate for extend of damages Will give a letter of support for physical therapy and disability Valtoco  as rescue medication Please contact us  if you do have a another seizure Return in 3 months or sooner if worse.

## 2024-02-04 ENCOUNTER — Other Ambulatory Visit (HOSPITAL_COMMUNITY): Payer: Self-pay

## 2024-02-04 ENCOUNTER — Telehealth: Payer: Self-pay | Admitting: Pharmacist

## 2024-02-04 NOTE — Telephone Encounter (Signed)
 Pharmacy Patient Advocate Encounter   Received notification from Patient Pharmacy that prior authorization for Valtoco  20 MG Dose 2 x 10MG /0.1ML liquid is required/requested.   Insurance verification completed.   The patient is insured through [employer] .   Per test claim: PA required; PA submitted to above mentioned insurance via CoverMyMeds Key/confirmation #/EOC [id number] Status is pending

## 2024-02-04 NOTE — Telephone Encounter (Signed)
 Pharmacy Patient Advocate Encounter  Received notification from [facility] that Prior Authorization for Valtoco  20 MG Dose 2 x 10MG /0.1ML liquid has been APPROVED from 01/05/2024 to 02/03/2025   PA #/Case ID/Reference #: [id number]

## 2024-02-05 ENCOUNTER — Telehealth: Payer: Self-pay | Admitting: Neurology

## 2024-02-05 NOTE — Telephone Encounter (Signed)
 [employer]gfried [employer]: [id number] exp. 02/05/24-03/21/24 sent to [facility] [phone]

## 2024-02-06 ENCOUNTER — Telehealth: Payer: Self-pay

## 2024-02-06 NOTE — Telephone Encounter (Signed)
 Paperwork completed and faxed by medical records. Original and copy placed back in MR office

## 2024-02-07 ENCOUNTER — Ambulatory Visit (HOSPITAL_COMMUNITY)
Admission: RE | Admit: 2024-02-07 | Discharge: 2024-02-07 | Disposition: A | Discharge status: Home / Self Care | Source: Ambulatory Visit | Attending: Neurology | Admitting: Neurology

## 2024-02-07 DIAGNOSIS — G40001 Localization-related (focal) (partial) idiopathic epilepsy and epileptic syndromes with seizures of localized onset, not intractable, with status epilepticus: Secondary | ICD-10-CM | POA: Diagnosis present

## 2024-02-08 ENCOUNTER — Emergency Department (HOSPITAL_COMMUNITY)
Admission: EM | Admit: 2024-02-08 | Discharge: 2024-02-08 | Disposition: A | Discharge status: Home / Self Care | Attending: Emergency Medicine | Admitting: Emergency Medicine

## 2024-02-08 ENCOUNTER — Emergency Department (HOSPITAL_COMMUNITY)

## 2024-02-08 ENCOUNTER — Encounter (HOSPITAL_COMMUNITY): Payer: Self-pay | Admitting: Emergency Medicine

## 2024-02-08 ENCOUNTER — Other Ambulatory Visit: Payer: Self-pay

## 2024-02-08 DIAGNOSIS — Z794 Long term (current) use of insulin: Secondary | ICD-10-CM | POA: Diagnosis not present

## 2024-02-08 DIAGNOSIS — W19XXXA Unspecified fall, initial encounter: Secondary | ICD-10-CM

## 2024-02-08 DIAGNOSIS — Z8673 Personal history of transient ischemic attack (TIA), and cerebral infarction without residual deficits: Secondary | ICD-10-CM | POA: Insufficient documentation

## 2024-02-08 DIAGNOSIS — Z431 Encounter for attention to gastrostomy: Secondary | ICD-10-CM

## 2024-02-08 DIAGNOSIS — Z7982 Long term (current) use of aspirin: Secondary | ICD-10-CM | POA: Insufficient documentation

## 2024-02-08 MED ORDER — DIATRIZOATE MEGLUMINE & SODIUM 66-10 % PO SOLN
30.0000 mL | Freq: Once | ORAL | Status: AC
  Administered 2024-02-08: 30 mL
  Filled 2024-02-08: qty 30

## 2024-02-08 MED ORDER — DIATRIZOATE MEGLUMINE & SODIUM 66-10 % PO SOLN
ORAL | Status: AC
Start: 2024-02-08 — End: ?
  Filled 2024-02-08: qty 30

## 2024-02-08 NOTE — ED Provider Notes (Signed)
 [place] EMERGENCY DEPARTMENT AT [facility] Provider Note   CSN: [id number] Arrival date & time: 02/08/24  4098     History  Chief Complaint  Patient presents with   Possible fall   G-tube replacement needed    Meredith Park is a 53 y.o. female.  53 year old female who presents from rehab facility after witnessed fall.  Patient has a history of stroke with left-sided deficits.  Patient is bedbound at baseline.  Fell approximately 6 to 12 inches from a bed.  Per medical records, does not take any blood thinners.  Concern for possible G-tube dislocation.  Reviewed the patient's old records and it looks like back on April 21 they attempted to place a G-tube but did not do it.  Recommended surgical placement.  I cannot find a record for that.  Will contact nursing facility for this.  Due to patient's cognitive deficits at baseline, she cannot tell me if she had a G-tube.  She states that she hurts all over.  She arrives in c-collar       Home Medications Prior to Admission medications   Medication Sig Start Date End Date Taking? Authorizing Provider  aspirin  81 MG chewable tablet Place 81 mg into feeding tube daily.    [provider]  atorvastatin  (LIPITOR) 40 MG tablet Place 1 tablet (40 mg total) into feeding tube daily. 01/03/24   Rai, Hurman Maiden, MD  carvedilol  (COREG ) 3.125 MG tablet Place 1 tablet (3.125 mg total) into feeding tube 2 (two) times daily with a meal. 01/02/24   Rai, Ripudeep K, MD  diazePAM , 20 MG Dose, (VALTOCO  20 MG DOSE) 2 x 10 MG/0.1ML LQPK Place 20 mg into the nose as needed (for seizure lasting more than 2 minutes). 02/01/24   Camara, Amadou, MD  docusate (COLACE) 50 MG/5ML liquid Take 10 mLs (100 mg total) by mouth 2 (two) times daily as needed for mild constipation. 01/02/24   Rai, Hurman Maiden, MD  hydrocortisone  cream 1 % Apply topically as needed for itching. Apply to Right side of Neck 01/02/24   Rai, Hurman Maiden, MD  insulin  aspart  (NOVOLOG ) 100 UNIT/ML injection Inject 0-9 Units into the skin every 4 (four) hours. Sliding scale CBG 70 - 120: 0 units CBG 121 - 150: 1 unit,  CBG 151 - 200: 2 units,  CBG 201 - 250: 3 units,  CBG 251 - 300: 5 units,  CBG 301 - 350: 7 units,  CBG 351 - 400: 9 units   CBG > 400: 9 units and notify your MD 01/02/24   Rai, Hurman Maiden, MD  ipratropium-albuterol  (DUONEB) 0.5-2.5 (3) MG/3ML SOLN Take 3 mLs by nebulization every 6 (six) hours as needed. 01/02/24   Rai, Hurman Maiden, MD  lamoTRIgine  (LAMICTAL ) 200 MG tablet Take 100 mg by mouth 2 (two) times daily. Take medication with two (25mg ) tablet by mouth twice daily for a total of 250mg . 10/14/22   [provider]  lamoTRIgine  (LAMICTAL ) 25 MG tablet Take 50 mg by mouth 2 (two) times daily. Take with one (200mg ) tablet by mouth twice daily for a dose total of 250mg . 09/15/22   [provider]  Multiple Vitamin (MULTIVITAMIN WITH MINERALS) TABS tablet Place 1 tablet into feeding tube daily. 01/03/24   Rai, Hurman Maiden, MD  Nutritional Supplements (FEEDING SUPPLEMENT, JEVITY 1.5 CAL/FIBER,) LIQD Place 1,000 mLs into feeding tube continuous. 01/02/24   Rai, Hurman Maiden, MD  nystatin  cream (MYCOSTATIN ) Apply topically 2 (two) times daily.  01/02/24   Rai, Hurman Maiden, MD  pantoprazole  sodium (PROTONIX ) 40 mg Place 40 mg into feeding tube 2 (two) times daily. 01/02/24   Rai, Hurman Maiden, MD  polyethylene glycol (MIRALAX  / GLYCOLAX ) 17 g packet Place 17 g into feeding tube daily as needed for moderate constipation. 01/02/24   Rai, Hurman Maiden, MD  Protein (FEEDING SUPPLEMENT, PROSOURCE TF20,) liquid Place 60 mLs into feeding tube daily. 01/03/24   Rai, Hurman Maiden, MD  QUEtiapine  (SEROQUEL ) 50 MG tablet Place 1 tablet (50 mg total) into feeding tube at bedtime. 01/02/24   Rai, Hurman Maiden, MD  sertraline  (ZOLOFT ) 25 MG tablet Place 1 tablet (25 mg total) into feeding tube at bedtime. Patient taking differently: Place 50 mg into feeding tube at bedtime. 01/02/24    Rai, Hurman Maiden, MD  sodium bicarbonate  650 MG tablet Place 1 tablet (650 mg total) into feeding tube 3 (three) times daily. Patient not taking: Reported on 02/01/2024 01/02/24   Rai, Hurman Maiden, MD  topiramate  (TOPAMAX ) 100 MG tablet Place 1 tablet (100 mg total) into feeding tube 2 (two) times daily. 02/01/24   Camara, Amadou, MD  Water  For Irrigation, Sterile (FREE WATER ) SOLN Place 200 mLs into feeding tube every 4 (four) hours. 01/02/24   Rai, Hurman Maiden, MD  Zinc Oxide (TRIPLE PASTE) 12.8 % ointment Apply 1 Application topically in the morning and at bedtime.    [provider]      Allergies    Meperidine, Hydrochlorothiazide, and Keppra  [levetiracetam ]    Review of Systems   Review of Systems  Unable to perform ROS: Mental status change    Physical Exam Updated Vital Signs BP 122/82   Pulse 82   Temp 98.8 F (37.1 C) (Oral)   Resp 18   Ht 1.575 m (5\' 2" )   Wt 62.7 kg   LMP 08/28/2020   SpO2 99%   BMI 25.28 kg/m  Physical Exam Vitals and nursing note reviewed.  Constitutional:      General: She is not in acute distress.    Appearance: Normal appearance. She is well-developed. She is not toxic-appearing.  HENT:     Head: Normocephalic and atraumatic.  Eyes:     General: Lids are normal.     Conjunctiva/sclera: Conjunctivae normal.     Pupils: Pupils are equal, round, and reactive to light.  Neck:     Thyroid: No thyroid mass.     Trachea: No tracheal deviation.  Cardiovascular:     Rate and Rhythm: Normal rate and regular rhythm.     Heart sounds: Normal heart sounds. No murmur heard.    No gallop.  Pulmonary:     Effort: Pulmonary effort is normal. No respiratory distress.     Breath sounds: Normal breath sounds. No stridor. No decreased breath sounds, wheezing, rhonchi or rales.  Abdominal:     General: There is no distension.     Palpations: Abdomen is soft.     Tenderness: There is no abdominal tenderness. There is no rebound.    Musculoskeletal:         General: No tenderness. Normal range of motion.     Cervical back: Normal range of motion and neck supple.  Skin:    General: Skin is warm and dry.     Findings: No abrasion or rash.  Neurological:     Mental Status: She is alert. Mental status is at baseline. She is disoriented.     GCS: GCS eye subscore is 4.  GCS verbal subscore is 5. GCS motor subscore is 5.     Cranial Nerves: Cranial nerve deficit present.     Sensory: Sensory deficit present.     Motor: Weakness present.  Psychiatric:        Attention and Perception: Attention normal.        Speech: Speech normal.        Behavior: Behavior normal.     ED Results / Procedures / Treatments   Labs (all labs ordered are listed, but only abnormal results are displayed) Labs Reviewed - No data to display  EKG EKG Interpretation Date/Time:  Friday February 08 2024 10:30:59 EDT Ventricular Rate:  83 PR Interval:  130 QRS Duration:  78 QT Interval:  384 QTC Calculation: 451 R Axis:   51  Text Interpretation: Sinus rhythm with Premature atrial complexes Otherwise normal ECG No previous ECGs available Confirmed by Lind Repine ([id number]) on 02/08/2024 10:44:24 AM  Radiology No results found.  Procedures .Gastrostomy tube replacement  Date/Time: 02/08/2024 10:14 AM  Performed by: Lind Repine, MD Authorized by: Lind Repine, MD  Consent: Verbal consent obtained. Written consent not obtained. Risks and benefits: risks, benefits and alternatives were discussed Time out: Immediately prior to procedure a "time out" was called to verify the correct patient, procedure, equipment, support staff and site/side marked as required. Preparation: Patient was prepped and draped in the usual sterile fashion. Local anesthesia used: no  Anesthesia: Local anesthesia used: no  Sedation: Patient sedated: no  Patient tolerance: patient tolerated the procedure well with no immediate complications       Medications Ordered in  ED Medications - No data to display  ED Course/ Medical Decision Making/ A&P                                 Medical Decision Making Amount and/or Complexity of Data Reviewed Radiology: ordered. ECG/medicine tests: ordered.  Risk Prescription drug management.  Patient is EKG shows normal sinus rhythm.  No ischemic changes noted. Patient here after mechanical fall.  CT of head and cervical spine without acute findings.  Had nurse call patient's facility and patient did have a G-tube in prior to arrival.  I replaced the G-tube as above and confirmed placement with Gastrografin.  Plan will be to discharge back to facility        Final Clinical Impression(s) / ED Diagnoses Final diagnoses:  None    Rx / DC Orders ED Discharge Orders     None         Lind Repine, MD 02/08/24 1044

## 2024-02-08 NOTE — ED Triage Notes (Signed)
 BIB [facility] from [facility] after potential fall. Pt was found laying facedown in floor. Pt is a & o at baseline. Pt rolled off mattress for fall risk pt's that is close to the floor. Approx 6-12 in from floor. Pt c/o left shoulder pain. It is unknown if pt had LOC or any injuries from "fall". G-tube not in place at time of arrival to ED, reported that it is unknown when it came out.    BP 138/pal HR 65 SpO2 99 RA

## 2024-02-08 NOTE — Discharge Instructions (Signed)
 The patient will need to be set up to have a new G-tube by the nursing home staff.  you may use the temporary Foley catheter until then  CAT scans of the head and neck did not show any evidence of stroke or trauma

## 2024-02-08 NOTE — ED Notes (Signed)
 Attempted to call report to [employer]. No answer. Pt sent home with discharge paperwork.

## 2024-02-08 NOTE — ED Notes (Signed)
 Ptar called for pt going to [facility], NO ETA

## 2024-02-15 ENCOUNTER — Ambulatory Visit: Payer: Self-pay | Admitting: Neurology

## 2024-02-15 ENCOUNTER — Other Ambulatory Visit: Payer: Self-pay | Admitting: Neurology

## 2024-02-15 DIAGNOSIS — G40901 Epilepsy, unspecified, not intractable, with status epilepticus: Secondary | ICD-10-CM

## 2024-02-15 DIAGNOSIS — G40001 Localization-related (focal) (partial) idiopathic epilepsy and epileptic syndromes with seizures of localized onset, not intractable, with status epilepticus: Secondary | ICD-10-CM

## 2024-02-18 ENCOUNTER — Telehealth: Payer: Self-pay

## 2024-02-20 NOTE — Telephone Encounter (Signed)
 Pt called  with Camile From [facility] called in regards to  where is Pt suppose to go for EEG Appt , Pt is requesting a call back with this information

## 2024-02-22 ENCOUNTER — Telehealth: Payer: Self-pay | Admitting: Neurology

## 2024-02-22 NOTE — Telephone Encounter (Signed)
 Patient's mother brought in [facility] and Critical illness forms for Dr. Samara Crest. Brought to Abran Abrahams in MR. Paid form fee $100

## 2024-02-25 DIAGNOSIS — Z0289 Encounter for other administrative examinations: Secondary | ICD-10-CM

## 2024-02-27 NOTE — Telephone Encounter (Signed)
 Received completed forms, called pt's mother who is coming into office to pick up. Placed up front

## 2024-03-04 ENCOUNTER — Institutional Professional Consult (permissible substitution): Admitting: Neurology

## 2024-03-09 DIAGNOSIS — G40909 Epilepsy, unspecified, not intractable, without status epilepticus: Secondary | ICD-10-CM | POA: Diagnosis not present

## 2024-03-21 ENCOUNTER — Encounter (INDEPENDENT_AMBULATORY_CARE_PROVIDER_SITE_OTHER): Payer: Self-pay | Admitting: Neurology

## 2024-03-21 DIAGNOSIS — G40001 Localization-related (focal) (partial) idiopathic epilepsy and epileptic syndromes with seizures of localized onset, not intractable, with status epilepticus: Secondary | ICD-10-CM

## 2024-03-21 DIAGNOSIS — G40901 Epilepsy, unspecified, not intractable, with status epilepticus: Secondary | ICD-10-CM

## 2024-03-21 NOTE — Procedures (Signed)
 Clinical History :  [name] who presents after a hospital visit of status epilepticus.   INTERMITTENT MONITORING with VIDEO TECHNICAL SUMMARY:  This AVEEG was performed using equipment provided by [employer] utilizing Bluetooth ( [employer] ) amplifiers with continuous EEGT attended video collection using encrypted remote transmission via [employer] secured cellular tower network with data rates for each AVEEG performed. This is a Therapist, music AVEEG, obtained, according to the 10-20 international electrode placement system, reformatted digitally into referential and bipolar montages. Data was acquired with a minimum of 21 bipolar connections and sampled at a minimum rate of 250 cycles per second per channel, maximum rate of 450 cycles per second per channel and two channels for EKG. The entire VEEG study was recorded through cable and or radio telemetry for subsequent analysis. Specified epochs of the AVEEG data were identified at the direction of the subject by the depression of a push button by the patient. Each patients event file included data acquired two minutes prior to the push button activation and continuing until two minutes afterwards. AVEEG files were reviewed on [employer] [facility] server, Licensed Software provided by [employer] with a digital high frequency filter set at 70 Hz and a low frequency filter set at 1 Hz with a paper speed of 15mm/s resulting in 10 seconds per digital page. This entire AVEEG was reviewed by the EEG Technologist. Random time samples, random sleep samples, clips, patient initiated push button files with included patient daily diary logs, EEG Technologist pruned data was reviewed and verified for accuracy and validity by the governing reading neurologist in full details. This AEEGV was fully compliant with all requirements for CPT 97500 for setup, patient education, take down and administered by an EEG technologist.   Long-Term EEG with  Video was monitored intermittently by a qualified EEG technologist for the entirety of the recording; quality check-ins were performed at a minimum of every two hours, checking and documenting real-time data and video to assure the integrity and quality of the recording (e.g., camera position, electrode integrity and impedance), and identify the need for maintenance. For intermittent monitoring, an EEG Technologist monitored no more than 12 patients concurrently. Diagnostic video was captured at least 80% of the time during the recording.   PATIENT EVENTS:  There were no patient events noted or captured during this recording.   TECHNOLOGIST EVENTS:  There is continuous right hemispheric slowing. There were a few left frontotemporal sharp and slow wave. No seizures seen.   TIME SAMPLES:  10-minutes of every 2 hours recorded are reviewed as random time samples.   SLEEP SAMPLES:  5-minutes of every 24 hours recorded are reviewed as random sleep samples.   AWAKE:  At maximal level of alertness, the posterior dominant background activity was continuous, reactive, low voltage rhythm of 9 Hz. This was symmetric, well-modulated, and attenuated with eye opening. Diffuse, symmetric, frontocentral beta range activity was present.   SLEEP:  N1 Sleep (Stage 1) was observed and characterized by the disappearance of alpha rhythm and the appearance of vertex activity.  N2 Sleep (Stage 2) was observed and characterized by vertex waves, K-complexes, and sleep spindles.   N3 (Stage 3) sleep was observed and characterized by high amplitude Delta activity of 20%.   REM sleep was observed.   EKG:  There were no arrhythmias or abnormalities noted during this recording.   Impression:  Occasional left frontotemporal sharps  Continuous right hemispheric slowing   Clinical Correlation:  This is an abnormal 3-day ambulatory EEG tracing due to presence of epileptiform discharges in the left frontotemporal  region and right hemispheric slowing. This is consistent with an increase epileptogenic potential in the left frontotemporal region and neuronal dysfunction within the right hemisphere. No seizures were captured.   Infant Doane, MD [facility]

## 2024-03-24 ENCOUNTER — Ambulatory Visit: Payer: Self-pay | Admitting: Neurology

## 2024-05-02 ENCOUNTER — Encounter: Payer: Self-pay | Admitting: Neurology

## 2024-05-02 ENCOUNTER — Ambulatory Visit (INDEPENDENT_AMBULATORY_CARE_PROVIDER_SITE_OTHER): Admitting: Neurology

## 2024-05-02 VITALS — BP 106/73 | HR 75 | Ht 63.0 in

## 2024-05-02 DIAGNOSIS — G40001 Localization-related (focal) (partial) idiopathic epilepsy and epileptic syndromes with seizures of localized onset, not intractable, with status epilepticus: Secondary | ICD-10-CM | POA: Diagnosis not present

## 2024-05-02 DIAGNOSIS — R4189 Other symptoms and signs involving cognitive functions and awareness: Secondary | ICD-10-CM

## 2024-05-02 DIAGNOSIS — Z5181 Encounter for therapeutic drug level monitoring: Secondary | ICD-10-CM

## 2024-05-02 DIAGNOSIS — G8194 Hemiplegia, unspecified affecting left nondominant side: Secondary | ICD-10-CM | POA: Diagnosis not present

## 2024-05-02 DIAGNOSIS — H5340 Unspecified visual field defects: Secondary | ICD-10-CM | POA: Diagnosis not present

## 2024-05-02 NOTE — Progress Notes (Signed)
 [facility]  PATIENT: Meredith Park DOB: 19-Feb-1971  REQUESTING CLINICIAN: Fulbright, [place]  E, * HISTORY FROM: Patient and mother  REASON FOR VISIT: Status epilepticus follow up    HISTORICAL  CHIEF COMPLAINT:  Chief Complaint  Patient presents with   Follow-up    Pt in room 13. Mother in room. Here for seizure follow up. No recent seizures.   INTERVAL HISTORY 05/02/2024:  Patient presents today for follow-up, last visit was in May at that time we continued her current medication, obtained a updated EEG showing right hemispheric slowing and left temporal discharge and an MRI showing extensive damage across the entire right hemisphere.  Mother tells me that she has not had a seizure, she is compliant with her medications, she is improving, now able to drink thin liquids.   INTERVAL HISTORY 02/01/2024:  Discussed the use of AI scribe software for clinical note transcription with the patient, who gave verbal consent to proceed.  History of Present Illness   Meredith Park is a 53 year old female with epilepsy whom I have not seen in the past 2 years. She is presenting today with her mother and sister following hospitalization for status epilepticus. Following that admission she has subsequent neurological deficits including left hemiplegia, visual field cut and cognitive deficits.  At our last visit, she was on Lacosamide  with plan to titrate up depending on seizure frequency. She was seen at [facility] and her medication was changed to Lamotrigine  250 mg twice daily and Topiramate  100 mg nightly. Family reports that she was seizure-free for about six months after adjusting her medication dosage, which allowed her to resume driving. However, on March 25th, she experienced a prolonged seizure lasting ?between one to five hours per mother, leading to hospitalization. During her hospitalization, she was placed on a ventilator for two weeks and experienced complications  including brain swelling and septic shock. She was eventually discharged after a month but has been left with significant neurological deficits. She has a peg tube.  She is currently experiencing left-sided paralysis and cognitive impairments, including memory issues and disorientation to time and place. She often believes her children are still young and has difficulty recognizing the current date and year. There are also reports of hallucinations described as having 4 to 5 other roommates when in fact, she only has one. She has been undergoing rehabilitation, including physical, occupational, and speech therapy, to address her paralysis and swallowing difficulties. She has started eating pureed meals and thickened liquids. She has also been experiencing pain in her left arm, which she describes as 'hurting a lot'.  Mother thinks that patient might have missed her Topiramate  (ran out) prior to her seizure in March. She resumed the full dose without titration, which may have contributed to the seizure. Since discharge from the hospital, they have not reported any other seizures.       INTERVAL HISTORY 04/10/2022:  Meredith Park presents today for follow-up, she is accompanied by her daughter who provides some history.  Since last visit she continues to have seizures.  She reports having small seizures where she will stare, have speech arrest and others where she will have generalized convulsion.  She reports that her last generalized convulsion was on July 1.  Patient reports remembering sitting down, had odd smell, then feeling hot then she remembers waking up from the floor with people around her.  She did have urinary incontinence and tongue biting.  She reports being under a lot of stress  surrounding the date of the seizure.  She is currently on Vimpat  150 mg, denies any side effect of the medication report compliance.  She has noted her last few generalized convulsion occurred around the first of the month.     INTERVAL HISTORY 10/10/2021:  Patient presents for follow-up, last visit was in December, at that time plan was to get an EEG and brain MRI. EEG completed and showed right temporal sharp and intermittent right temporal slowing. MRI brain with no acute finding.  Patient presented to the emergency department on January 26, at that time she was reporting multiple seizure-like episode, reportedly staring, she stated that she could not understand, could not state her name, could not even remember her mother's name. Episode would last about 10 minutes.  She was admitted to the hospital, had a long-term monitoring for total of 5 days and no seizures were captured, and there was no evidence of epileptiform discharges.  Patient was discharged home with slight increase of Vimpat  150 mg in the morning and 100 at night with plan to follow-up with me. She reports currently she is under a lot of stress due to her son.  Her son has a diagnosis of schizophrenia and he is abusing street drugs, currently he is living in the street.  Her father was able to get him a apartment next to her house and this is again a big source of stress per patient.   HISTORY OF PRESENT ILLNESS:  This is a 53 year old woman with PMHx Seizure in the setting of alcohol withdrawal, hypertension, hyperlipidemia, alcohol abuse in remission who is presenting with complaint of seizures.  Patient said that 10 days ago he was riding in her car, started to get warm felt like she was responding but family told her the only thing she was saying was okay, okay, okay,.  She was able to pull the car on the side of the road and get out of the car.  Patient reported once she get out of the car, the next thing that she remembers is waking up 40 minutes later in the ambulance, she did not remember the event.  Mother was present and witnessed patient coming out of the car, she was staring, felt like she was going to fall down and she help her to the ground, no  abnormal movements noted but patient was unresponsive for about 5 minutes, EMS was called.  Mother also has reported episode of staring, where she will stare, be unresponsive lasting seconds and she will go back to her normal self.  No episode of abnormal movement, no convulsion.  The night before the event patient reported taking her son's Adderall.  Patient stated that she is going through menopause and thinks some of these hot flashes are related to menopause. She reported in the end in the month of June she was admitted for altered mental status, noted that her ammonia level was elevated and was told that it was secondary to the losartan  which was discontinued. She reported history of alcohol abuse states that she has been sober for the past 2-1/2 years.  However she does take marijuana cookies at night. Patient also reports loud snoring, daytime fatigue.  She states she has a smart watch that has been telling her sleep quality is poor, that she has apnea, up to 5 times per hour. She has never been evaluated for sleep apnea.   Handedness: right handed   Seizure Type: Staring type, unresponsive, generalized convulsion  Current frequency: Last episode in march when she was admitted for status epilepticus   Any injuries from seizures: None  Seizure risk factors: Alcohol abuse, No family history of seizure  Previous ASMs: None   Currenty ASMs: Lamotrigine  250 mg twice daily, Topiramate  100 mg twice daily   ASMs side effects: Not applicable  Brain Images: Head CT November 2022 which was normal  Previous EEGs: Right hemispheric slowing and left temporal epileptiform discharges    OTHER MEDICAL CONDITIONS: Alcohol abuse in remission, HTN, HLD, sleep apnea  REVIEW OF SYSTEMS: Full 14 system review of systems performed and negative with exception of: as noted in the HPI   ALLERGIES: Allergies  Allergen Reactions   Meperidine Itching   Hydrochlorothiazide Other (See Comments)    Raises  ammonia level   Keppra  [Levetiracetam ] Other (See Comments)    Behavioral, suicidal ideation     HOME MEDICATIONS: Outpatient Medications Prior to Visit  Medication Sig Dispense Refill   acetaminophen  (TYLENOL ) 325 MG tablet Take 650 mg by mouth every 6 (six) hours as needed.     aspirin  81 MG chewable tablet Place 81 mg into feeding tube daily.     atorvastatin  (LIPITOR) 40 MG tablet Place 1 tablet (40 mg total) into feeding tube daily.     carvedilol  (COREG ) 3.125 MG tablet Place 1 tablet (3.125 mg total) into feeding tube 2 (two) times daily with a meal.     clonazePAM  (KLONOPIN ) 0.5 MG tablet Take 0.5 mg by mouth 2 (two) times daily as needed for anxiety.     docusate (COLACE) 50 MG/5ML liquid Take 10 mLs (100 mg total) by mouth 2 (two) times daily as needed for mild constipation.     hydrocortisone  cream 1 % Apply topically as needed for itching. Apply to Right side of Neck     ipratropium-albuterol  (DUONEB) 0.5-2.5 (3) MG/3ML SOLN Take 3 mLs by nebulization every 6 (six) hours as needed.     lamoTRIgine  (LAMICTAL ) 200 MG tablet Take 100 mg by mouth 2 (two) times daily. Take medication with two (25mg ) tablet by mouth twice daily for a total of 250mg .     lamoTRIgine  (LAMICTAL ) 25 MG tablet Take 50 mg by mouth 2 (two) times daily. Take with one (200mg ) tablet by mouth twice daily for a dose total of 250mg .     Multiple Vitamin (MULTIVITAMIN WITH MINERALS) TABS tablet Place 1 tablet into feeding tube daily.     pantoprazole  sodium (PROTONIX ) 40 mg Place 40 mg into feeding tube 2 (two) times daily.     polyethylene glycol (MIRALAX  / GLYCOLAX ) 17 g packet Place 17 g into feeding tube daily as needed for moderate constipation.     QUEtiapine  (SEROQUEL ) 50 MG tablet Place 1 tablet (50 mg total) into feeding tube at bedtime. (Patient taking differently: Place 50 mg into feeding tube at bedtime. 100 mg bid) 10 tablet 0   sertraline  (ZOLOFT ) 25 MG tablet Place 1 tablet (25 mg total) into feeding  tube at bedtime. (Patient taking differently: Place 50 mg into feeding tube at bedtime. 100 mg daily)     topiramate  (TOPAMAX ) 100 MG tablet Place 1 tablet (100 mg total) into feeding tube 2 (two) times daily. 180 tablet 3   diazePAM , 20 MG Dose, (VALTOCO  20 MG DOSE) 2 x 10 MG/0.1ML LQPK Place 20 mg into the nose as needed (for seizure lasting more than 2 minutes). (Patient not taking: Reported on 05/02/2024) 5 each 1   insulin  aspart (NOVOLOG ) 100 UNIT/ML  injection Inject 0-9 Units into the skin every 4 (four) hours. Sliding scale CBG 70 - 120: 0 units CBG 121 - 150: 1 unit,  CBG 151 - 200: 2 units,  CBG 201 - 250: 3 units,  CBG 251 - 300: 5 units,  CBG 301 - 350: 7 units,  CBG 351 - 400: 9 units   CBG > 400: 9 units and notify your MD (Patient not taking: Reported on 05/02/2024)     Nutritional Supplements (FEEDING SUPPLEMENT, JEVITY 1.5 CAL/FIBER,) LIQD Place 1,000 mLs into feeding tube continuous. (Patient not taking: Reported on 05/02/2024)     nystatin  cream (MYCOSTATIN ) Apply topically 2 (two) times daily. (Patient not taking: Reported on 05/02/2024)     Protein (FEEDING SUPPLEMENT, PROSOURCE TF20,) liquid Place 60 mLs into feeding tube daily. (Patient not taking: Reported on 05/02/2024)     sodium bicarbonate  650 MG tablet Place 1 tablet (650 mg total) into feeding tube 3 (three) times daily. (Patient not taking: Reported on 05/02/2024)     Water  For Irrigation, Sterile (FREE WATER ) SOLN Place 200 mLs into feeding tube every 4 (four) hours. (Patient not taking: Reported on 05/02/2024)     Zinc Oxide (TRIPLE PASTE) 12.8 % ointment Apply 1 Application topically in the morning and at bedtime. (Patient not taking: Reported on 05/02/2024)     No facility-administered medications prior to visit.    PAST MEDICAL HISTORY: Past Medical History:  Diagnosis Date   Alcohol abuse    Alcohol withdrawal (HCC) 08/28/2018   High cholesterol    Hypertension    Seizures (HCC)     PAST SURGICAL HISTORY: Past  Surgical History:  Procedure Laterality Date   IR GASTROSTOMY TUBE MOD SED  12/24/2023   PEG PLACEMENT N/A 12/25/2023   Procedure: INSERTION, PEG TUBE;  Surgeon: Paola Dreama SAILOR, MD;  Location: [facility] OR;  Service: General;  Laterality: N/A;  PERCUTANEOUS ENDOSCOPIC GASTROSTOMY TUBE PLACEMENT    FAMILY HISTORY: Family History  Problem Relation Age of Onset   Sleep apnea Neg Hx     SOCIAL HISTORY: Social History   Socioeconomic History   Marital status: Divorced    Spouse name: Not on file   Number of children: Not on file   Years of education: Not on file   Highest education level: Not on file  Occupational History   Not on file  Tobacco Use   Smoking status: Never   Smokeless tobacco: Never  Vaping Use   Vaping status: Former  Substance and Sexual Activity   Alcohol use: Not Currently    Comment: daily, 1/2 gallon a day x 3 days after relapse, 4 years sober prior to that   Drug use: Not Currently    Types: Marijuana   Sexual activity: Not on file  Other Topics Concern   Not on file  Social History Narrative   Not on file   Social Drivers of Health   Financial Resource Strain: Not on file  Food Insecurity: Low Risk  (07/18/2023)   Received from [facility]   Hunger Vital Sign    Within the past 12 months, you worried that your food would run out before you got money to buy more: Never true    Within the past 12 months, the food you bought just didn't last and you didn't have money to get more. : Never true  Transportation Needs: No Transportation Needs (07/18/2023)   Received from [facility]    In the past 12  months, has lack of reliable transportation kept you from medical appointments, meetings, work or from getting things needed for daily living? : No  Physical Activity: Not on file  Stress: Not on file  Social Connections: Not on file  Intimate Partner Violence: Not on file    PHYSICAL EXAM  GENERAL EXAM/CONSTITUTIONAL: Vitals:   Vitals:   05/02/24 1028  BP: 106/73  Pulse: 75  Height: 5' 3 (1.6 m)       Body mass index is 24.49 kg/m. Wt Readings from Last 3 Encounters:  02/08/24 138 lb 3.7 oz (62.7 kg)  01/02/24 138 lb 3.7 oz (62.7 kg)  11/23/22 233 lb 7.5 oz (105.9 kg)   Patient is in no distress; well developed, nourished and groomed; neck is supple  MUSCULOSKELETAL: Gait, strength, tone, movements noted in Neurologic exam below  NEUROLOGIC: MENTAL STATUS:      No data to display         awake, alert, oriented to person, but not place or time Difficulty with recent memory  Difficulty with attention and concentration language fluent, comprehension intact, naming intact  CRANIAL NERVE:  2nd, 3rd, 4th, 6th - pupils equal and reactive to light, There is a left visual fields cut, extraocular muscles intact, no nystagmus. There is also a right gaze preference  5th - facial sensation symmetric 7th - left facial droop  8th - hearing intact 9th - palate elevates symmetrically, uvula midline 11th - shoulder shrug symmetric 12th - tongue protrusion midline  MOTOR:  normal bulk and tone, full strength in the RUE, and RLE, she has left hemiplegia   SENSORY:  Decrease sensation to light touch on the left. She has extinction on double stimulation on the left   COORDINATION:  finger-nose-finger, fine finger movements normal on the right   GAIT/STATION:  Deferred.    DIAGNOSTIC DATA (LABS, IMAGING, TESTING) - I reviewed patient records, labs, notes, testing and imaging myself where available.  Lab Results  Component Value Date   WBC 7.8 12/31/2023   HGB 8.2 (L) 12/31/2023   HCT 25.8 (L) 12/31/2023   MCV 89.6 12/31/2023   PLT 137 (L) 12/31/2023      Component Value Date/Time   NA 138 12/31/2023 1041   NA 144 10/10/2021 1300   K 3.5 12/31/2023 1041   CL 108 12/31/2023 1041   CO2 19 (L) 12/31/2023 1041   GLUCOSE 111 (H) 12/31/2023 1041   BUN 22 (H) 12/31/2023 1041   BUN 12  10/10/2021 1300   CREATININE 0.72 12/31/2023 1041   CALCIUM  8.8 (L) 12/31/2023 1041   PROT 5.4 (L) 12/31/2023 1041   ALBUMIN  2.7 (L) 12/31/2023 1041   AST 48 (H) 12/31/2023 1041   ALT 31 12/31/2023 1041   ALKPHOS 76 12/31/2023 1041   BILITOT 0.6 12/31/2023 1041   GFRNONAA >60 12/31/2023 1041   GFRAA >60 11/10/2018 1016   Lab Results  Component Value Date   CHOL 165 12/09/2023   HDL 37 (L) 12/09/2023   LDLCALC 94 12/09/2023   TRIG 171 (H) 12/09/2023   Lab Results  Component Value Date   HGBA1C 5.0 11/29/2023   Lab Results  Component Value Date   VITAMINB12 1,348 (H) 10/03/2021   Lab Results  Component Value Date   TSH 2.521 11/28/2023    Head CT 07/12/2021 No acute intracranial abnormality.  MRI Brain 09/14/2021 MRI scan of the brain with and without contrast showing only mild nonspecific changes of chronic small vessel disease. Incidental calcified small pineal  region cyst is noted  MRI Brain 12/20/2023 1. Evolutionary changes throughout the right cerebral and left cerebellar hemispheres as described above most compatible with evolving injury from recent status epilepticus. T2 hyperintensity throughout the right cerebral hemispheric white matter and basal ganglia has increased, however brain swelling has decreased with resolved mass effect. New cortical laminar necrosis. 2. 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  MRI Brain 12/09/2023 1. Diffuse cortical edema and slight increased vascularity to the right hemisphere, so-called intravascular enhancement. No parenchymal enhancement is present. This is nonspecific and could be seen in a postictal state. Similar findings would be present in the setting of encephalitis. No focal enhancement is present to suggest a discrete infection. 2. No other pathologic enhancement  MRI Brain 11/30/2023 1. Extensive cortical restricted diffusion in the  right cerebral hemisphere and medial right thalamus with contralateral involvement of the cerebellum, pattern compatible with seizure phenomenon in this patient in recent status. 2. Small foci of restricted diffusion in the left temporal white matter more suggestive of white matter infarcts. There is chronic white matter disease.  MRI Brain 02/08/2024 1. No acute intracranial abnormality. 2. Extensive right hemispheric white matter T2 hyperintensity and volume loss with marked cortical thinning, consistent with sequelae of epileptic and significant ischemic changes. Associated right lateral ventricular dilation and cortical infarcts, most prominent in the right occipital lobe. 3. Moderately advanced left hemispheric white matter changes for age. 4. Right mastoid effusion  Routine EEG 08/23/2021 Right temporal sharps  Intermittent right temporal slowing   LTM EEG 09/30/2021 This study is within normal limits. No seizures or epileptiform discharges were seen throughout the recording.  LTM 11/28/2023 -Sharp waves, right hemisphere -Continuous slow, generalized and lateralized right hemisphere   LTM 12/10/2023 -Sharp wave, left frontotemporal region -Continuous slow, generalized and maximal right fronto-temporal region   LTM 12/15/2023 -Sharp wave, left frontotemporal region -Continuous slow, generalized and maximal right fronto-temporal region    AMB EEG 03/21/2024 Occasional left frontotemporal sharps  Continuous right hemispheric slowing    I personally reviewed brain Images and EEG.   ASSESSMENT AND PLAN  53 y.o. year old female  with past medical history of alcohol abuse in remission, hypertension, hyperlipidemia and epilepsy. Recently admitted for status epilepticus here for follow up    Assessment and Plan    Epilepsy with status epilepticus   Recurrent seizures led to a significant event on March 25, requiring hospitalization and ventilator support, likely due to missed  topiramate  doses. She is at high risk for future seizures without medication adherence. EEG indicates right-sided discharge as the seizure origin as well as left frontotemporal epileptiform discharges. 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 Recovery potential is uncertain, but therapy may offer some improvement. Extensive damage affects her ability to walk, see and use her left side. Repeat MRI Brain shows Extensive right hemispheric white matter T2 hyperintensity and volume loss with marked cortical thinning, consistent with sequelae of epileptic and significant ischemic changes. Associated right lateral ventricular dilation and cortical infarcts, most prominent in the right occipital lobe. 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 Some improvement is possible with therapy, but significant deficits remain, affecting her perception  of reality. Continue cognitive rehabilitation as part of skilled nursing care.  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 Partial idiopathic epilepsy with seizures of localized onset, not intractable, with status epilepticus (HCC)   2. Left hemiplegia (HCC)   3. Visual field cut   4. Cognitive impairment   5. Therapeutic drug monitoring      Patient Instructions  Continue lamotrigine  250 mg twice daily Continue topiramate  100 mg twice daily Continue your other medications Continue with occupational therapy Consider wrist brace mainly at night Return in 6 to 8 months or sooner   Per [place] statutes, patients with seizures are not allowed to drive  until they have been seizure-free for six months.  Other recommendations include using caution when using heavy equipment or power tools. Avoid working on ladders or at heights. Take showers instead of baths.  Do not swim alone.  Ensure the water  temperature is not too high on the home water  heater. Do not go swimming alone. Do not lock yourself in a room alone (i.e. bathroom). When caring for infants or small children, sit down when holding, feeding, or changing them to minimize risk of injury to the child in the event you have a seizure. Maintain good sleep hygiene. Avoid alcohol.  Also recommend adequate sleep, hydration, good diet and minimize stress.   During the Seizure  - First, ensure adequate ventilation and place patients on the floor on their left side  Loosen clothing around the neck and ensure the airway is patent. If the patient is clenching the teeth, do not force the mouth open with any object as this can cause severe damage - Remove all items from the surrounding that can be hazardous. The patient may be oblivious to what's happening and may not even know what he or she is doing. If the patient is confused and wandering, either gently guide him/her away and block access to outside areas - Reassure the individual and be comforting - Call 911. In most cases, the seizure ends before EMS arrives. However, there are cases when seizures may last over 3 to 5 minutes. Or the individual may have developed breathing difficulties or severe injuries. If a pregnant patient or a person with diabetes develops a seizure, it is prudent to call an ambulance. - Finally, if the patient does not regain full consciousness, then call EMS. Most patients will remain confused for about 45 to 90 minutes after a seizure, so you must use judgment in calling for help. - Avoid restraints but make sure the patient is in a bed with padded side rails - Place the individual in a lateral position with the neck slightly  flexed; this will help the saliva drain from the mouth and prevent the tongue from falling backward - Remove all nearby furniture and other hazards from the area - Provide verbal assurance as the individual is regaining consciousness - Provide the patient with privacy if possible - Call for help and start treatment as ordered by the caregiver   After the Seizure (Postictal Stage)  After a seizure, most patients experience confusion, fatigue, muscle pain and/or a headache. Thus, one should permit the individual to sleep. For the next few days, reassurance is essential. Being calm and helping reorient the person is also of importance.  Most seizures are painless and end spontaneously. Seizures are not harmful to others but can lead  to complications such as stress on the lungs, brain and the heart. Individuals with prior lung problems may develop labored breathing and respiratory distress.     Orders Placed This Encounter  Procedures   Topiramate  Level   Lamotrigine  level   Basic Metabolic Panel     No orders of the defined types were placed in this encounter.   Return in about 8 months (around 12/31/2024).  The patient's condition requires frequent monitoring and adjustments in the treatment plan, reflecting the ongoing complexity of care.  This provider is the continuing focal point for all needed services for this condition.   Pastor Falling, MD 05/02/2024, 12:00 PM  [facility] [address], [address] [place], [place] [address] [phone]

## 2024-05-02 NOTE — Patient Instructions (Signed)
 Continue lamotrigine  250 mg twice daily Continue topiramate  100 mg twice daily Continue your other medications Continue with occupational therapy Consider wrist brace mainly at night Return in 6 to 8 months or sooner

## 2024-05-04 LAB — BASIC METABOLIC PANEL WITH GFR
BUN/Creatinine Ratio: 18 (ref 9–23)
BUN: 15 mg/dL (ref 6–24)
CO2: 21 mmol/L (ref 20–29)
Calcium: 9.3 mg/dL (ref 8.7–10.2)
Chloride: 110 mmol/L — ABNORMAL HIGH (ref 96–106)
Creatinine, Ser: 0.84 mg/dL (ref 0.57–1.00)
Glucose: 107 mg/dL — ABNORMAL HIGH (ref 70–99)
Potassium: 3.6 mmol/L (ref 3.5–5.2)
Sodium: 146 mmol/L — ABNORMAL HIGH (ref 134–144)
eGFR: 84 mL/min/1.73 (ref >59)

## 2024-05-04 LAB — TOPIRAMATE LEVEL: Topiramate Lvl: 7.7 ug/mL (ref 2.0–25.0)

## 2024-05-04 LAB — LAMOTRIGINE LEVEL: Lamotrigine Lvl: 7.3 ug/mL (ref 2.0–20.0)

## 2024-05-06 ENCOUNTER — Ambulatory Visit: Payer: Self-pay | Admitting: Neurology

## 2025-01-01 ENCOUNTER — Ambulatory Visit: Admitting: Neurology
